# Patient Record
Sex: Female | Born: 1965 | Race: White | Hispanic: No | Marital: Married | Smoking: Former smoker
Health system: Southern US, Community
[De-identification: ages and names within clinical notes are randomized; demographics above are authoritative.]

## PROBLEM LIST (undated history)

## (undated) ENCOUNTER — Ambulatory Visit: Admission: EM

## (undated) DIAGNOSIS — T7840XA Allergy, unspecified, initial encounter: Secondary | ICD-10-CM

## (undated) DIAGNOSIS — I509 Heart failure, unspecified: Secondary | ICD-10-CM

## (undated) DIAGNOSIS — K759 Inflammatory liver disease, unspecified: Secondary | ICD-10-CM

## (undated) DIAGNOSIS — Z8489 Family history of other specified conditions: Secondary | ICD-10-CM

## (undated) DIAGNOSIS — D649 Anemia, unspecified: Secondary | ICD-10-CM

## (undated) DIAGNOSIS — G709 Myoneural disorder, unspecified: Secondary | ICD-10-CM

## (undated) DIAGNOSIS — F419 Anxiety disorder, unspecified: Secondary | ICD-10-CM

## (undated) DIAGNOSIS — H269 Unspecified cataract: Secondary | ICD-10-CM

## (undated) DIAGNOSIS — F32A Depression, unspecified: Secondary | ICD-10-CM

## (undated) DIAGNOSIS — F329 Major depressive disorder, single episode, unspecified: Secondary | ICD-10-CM

## (undated) DIAGNOSIS — K219 Gastro-esophageal reflux disease without esophagitis: Secondary | ICD-10-CM

## (undated) DIAGNOSIS — L732 Hidradenitis suppurativa: Secondary | ICD-10-CM

## (undated) HISTORY — DX: Allergy, unspecified, initial encounter: T78.40XA

## (undated) HISTORY — DX: Heart failure, unspecified: I50.9

## (undated) HISTORY — DX: Anxiety disorder, unspecified: F41.9

## (undated) HISTORY — DX: Anemia, unspecified: D64.9

## (undated) HISTORY — DX: Depression, unspecified: F32.A

## (undated) HISTORY — DX: Gastro-esophageal reflux disease without esophagitis: K21.9

## (undated) HISTORY — DX: Unspecified cataract: H26.9

## (undated) HISTORY — DX: Myoneural disorder, unspecified: G70.9

## (undated) HISTORY — DX: Major depressive disorder, single episode, unspecified: F32.9

## (undated) HISTORY — DX: Inflammatory liver disease, unspecified: K75.9

## (undated) HISTORY — DX: Hidradenitis suppurativa: L73.2

---

## 1999-03-20 ENCOUNTER — Other Ambulatory Visit: Admission: RE | Admit: 1999-03-20 | Discharge: 1999-03-20 | Payer: Self-pay | Admitting: Family Medicine

## 2000-05-15 ENCOUNTER — Encounter: Payer: Self-pay | Admitting: Family Medicine

## 2000-05-15 ENCOUNTER — Encounter: Admission: RE | Admit: 2000-05-15 | Discharge: 2000-05-15 | Payer: Self-pay | Admitting: Family Medicine

## 2000-05-18 ENCOUNTER — Encounter: Admission: RE | Admit: 2000-05-18 | Discharge: 2000-05-18 | Payer: Self-pay | Admitting: Family Medicine

## 2000-05-18 ENCOUNTER — Encounter: Payer: Self-pay | Admitting: Family Medicine

## 2001-08-24 ENCOUNTER — Other Ambulatory Visit: Admission: RE | Admit: 2001-08-24 | Discharge: 2001-08-24 | Payer: Self-pay | Admitting: Gynecology

## 2003-02-20 ENCOUNTER — Other Ambulatory Visit: Admission: RE | Admit: 2003-02-20 | Discharge: 2003-02-20 | Payer: Self-pay | Admitting: Gynecology

## 2003-11-09 ENCOUNTER — Other Ambulatory Visit: Admission: RE | Admit: 2003-11-09 | Discharge: 2003-11-09 | Payer: Self-pay | Admitting: Gynecology

## 2003-12-23 DIAGNOSIS — I509 Heart failure, unspecified: Secondary | ICD-10-CM

## 2003-12-23 HISTORY — DX: Heart failure, unspecified: I50.9

## 2004-01-23 ENCOUNTER — Encounter: Admission: RE | Admit: 2004-01-23 | Discharge: 2004-04-22 | Payer: Self-pay | Admitting: Gynecology

## 2004-05-01 ENCOUNTER — Inpatient Hospital Stay (HOSPITAL_COMMUNITY): Admission: RE | Admit: 2004-05-01 | Discharge: 2004-05-01 | Payer: Self-pay | Admitting: Gynecology

## 2004-05-02 ENCOUNTER — Encounter (INDEPENDENT_AMBULATORY_CARE_PROVIDER_SITE_OTHER): Payer: Self-pay | Admitting: Specialist

## 2004-05-02 ENCOUNTER — Inpatient Hospital Stay (HOSPITAL_COMMUNITY): Admission: RE | Admit: 2004-05-02 | Discharge: 2004-05-04 | Payer: Self-pay | Admitting: Gynecology

## 2004-06-13 ENCOUNTER — Other Ambulatory Visit: Admission: RE | Admit: 2004-06-13 | Discharge: 2004-06-13 | Payer: Self-pay | Admitting: Gynecology

## 2004-06-15 ENCOUNTER — Ambulatory Visit (HOSPITAL_COMMUNITY): Admission: RE | Admit: 2004-06-15 | Discharge: 2004-06-15 | Payer: Self-pay

## 2004-06-15 ENCOUNTER — Inpatient Hospital Stay (HOSPITAL_COMMUNITY): Admission: EM | Admit: 2004-06-15 | Discharge: 2004-06-18 | Payer: Self-pay | Admitting: Emergency Medicine

## 2004-06-17 ENCOUNTER — Encounter: Payer: Self-pay | Admitting: Cardiology

## 2004-07-23 ENCOUNTER — Encounter: Admission: RE | Admit: 2004-07-23 | Discharge: 2004-07-23 | Payer: Self-pay | Admitting: Internal Medicine

## 2004-12-22 DIAGNOSIS — K759 Inflammatory liver disease, unspecified: Secondary | ICD-10-CM

## 2004-12-22 HISTORY — PX: CHOLECYSTECTOMY: SHX55

## 2004-12-22 HISTORY — DX: Inflammatory liver disease, unspecified: K75.9

## 2004-12-24 ENCOUNTER — Ambulatory Visit: Payer: Self-pay

## 2004-12-26 ENCOUNTER — Ambulatory Visit: Payer: Self-pay | Admitting: Cardiology

## 2005-01-07 ENCOUNTER — Ambulatory Visit: Payer: Self-pay | Admitting: Internal Medicine

## 2005-02-14 ENCOUNTER — Ambulatory Visit: Payer: Self-pay | Admitting: Cardiology

## 2005-04-07 ENCOUNTER — Ambulatory Visit: Payer: Self-pay | Admitting: Internal Medicine

## 2005-04-08 ENCOUNTER — Ambulatory Visit: Payer: Self-pay | Admitting: Cardiology

## 2005-04-09 ENCOUNTER — Ambulatory Visit: Payer: Self-pay | Admitting: Internal Medicine

## 2005-04-18 ENCOUNTER — Ambulatory Visit: Payer: Self-pay

## 2005-04-18 ENCOUNTER — Ambulatory Visit: Payer: Self-pay | Admitting: Cardiology

## 2005-05-23 ENCOUNTER — Ambulatory Visit: Payer: Self-pay | Admitting: Cardiology

## 2005-05-27 ENCOUNTER — Ambulatory Visit: Payer: Self-pay | Admitting: Gastroenterology

## 2005-05-27 ENCOUNTER — Encounter: Payer: Self-pay | Admitting: Cardiology

## 2005-05-27 ENCOUNTER — Inpatient Hospital Stay (HOSPITAL_COMMUNITY): Admission: EM | Admit: 2005-05-27 | Discharge: 2005-05-28 | Payer: Self-pay | Admitting: Surgery

## 2005-05-30 ENCOUNTER — Ambulatory Visit: Payer: Self-pay | Admitting: Internal Medicine

## 2005-06-02 ENCOUNTER — Ambulatory Visit: Payer: Self-pay | Admitting: Gastroenterology

## 2005-06-03 ENCOUNTER — Ambulatory Visit: Payer: Self-pay | Admitting: Internal Medicine

## 2005-06-20 ENCOUNTER — Ambulatory Visit: Payer: Self-pay | Admitting: Internal Medicine

## 2005-07-08 ENCOUNTER — Ambulatory Visit (HOSPITAL_COMMUNITY): Admission: RE | Admit: 2005-07-08 | Discharge: 2005-07-09 | Payer: Self-pay | Admitting: General Surgery

## 2005-07-08 ENCOUNTER — Encounter (INDEPENDENT_AMBULATORY_CARE_PROVIDER_SITE_OTHER): Payer: Self-pay | Admitting: Specialist

## 2005-08-12 ENCOUNTER — Other Ambulatory Visit: Admission: RE | Admit: 2005-08-12 | Discharge: 2005-08-12 | Payer: Self-pay | Admitting: Gynecology

## 2005-08-22 ENCOUNTER — Ambulatory Visit: Payer: Self-pay | Admitting: Internal Medicine

## 2005-09-16 ENCOUNTER — Ambulatory Visit: Payer: Self-pay | Admitting: *Deleted

## 2005-11-25 ENCOUNTER — Ambulatory Visit: Payer: Self-pay | Admitting: Internal Medicine

## 2006-02-18 ENCOUNTER — Ambulatory Visit: Payer: Self-pay | Admitting: Internal Medicine

## 2006-02-24 ENCOUNTER — Ambulatory Visit: Payer: Self-pay | Admitting: Internal Medicine

## 2006-04-08 ENCOUNTER — Ambulatory Visit: Payer: Self-pay | Admitting: Cardiology

## 2006-04-29 ENCOUNTER — Encounter: Payer: Self-pay | Admitting: Internal Medicine

## 2006-04-29 ENCOUNTER — Ambulatory Visit: Payer: Self-pay

## 2006-05-12 IMAGING — CT CT ANGIO CHEST
1 of 6 series · 11 of 30 positions shown · IV contrast (omnipaque)
Comparison: none

CLINICAL DATA: 37-year-old female ? shortness of breath.  Six weeks postpartum and tachycardia.  
CT ANGIOGRAM OF THE CHEST WITH MULTIPLANAR RECONSTRUCTIONS 06/15/04
Multidetector CT imaging of the chest was performed according to the protocol for detection of pulmonary embolism during IV bolus injection of 120 cc Omnipaque 300.  Coronal and sagittal plane reformatted images were also generated.

[Series 4: pe w/ lower ext · axial · 0.75mm/px · z∈[-343,-84]mm · 11 of 255 slices shown]
[im 24/255  lung]
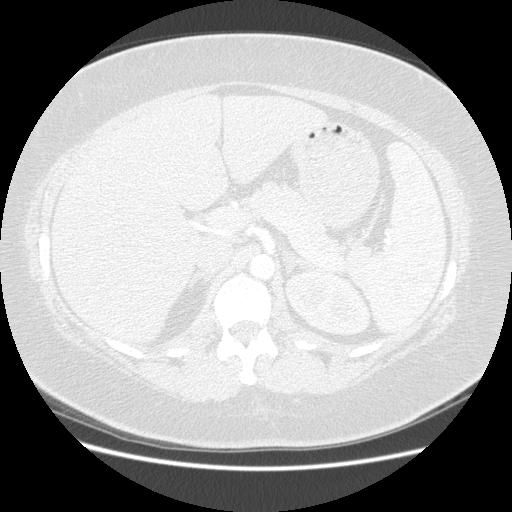
[im 47/255  mediastinal]
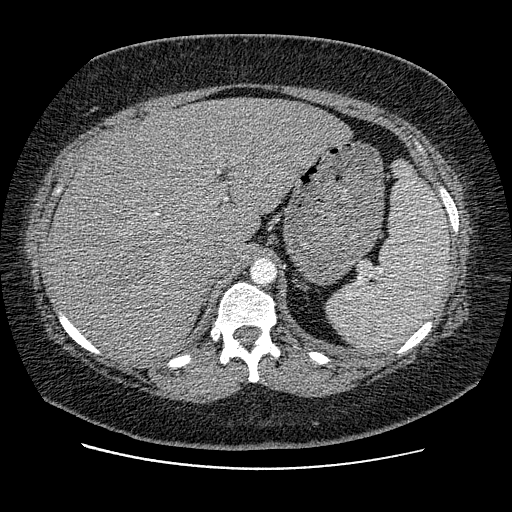
[im 70/255  lung]
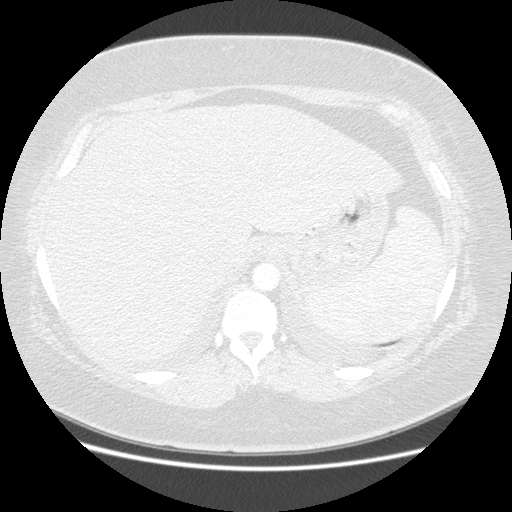
[im 93/255  mediastinal]
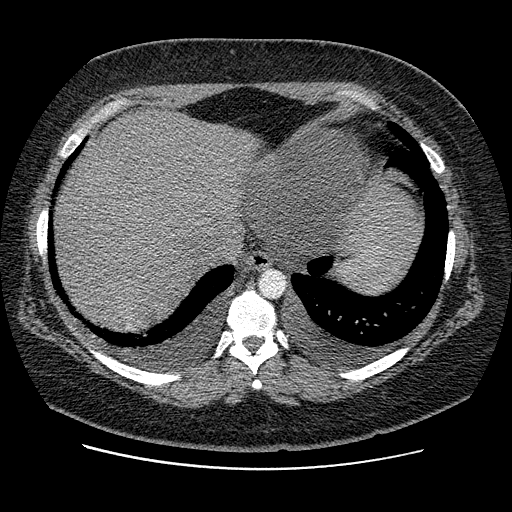
[im 116/255  lung]
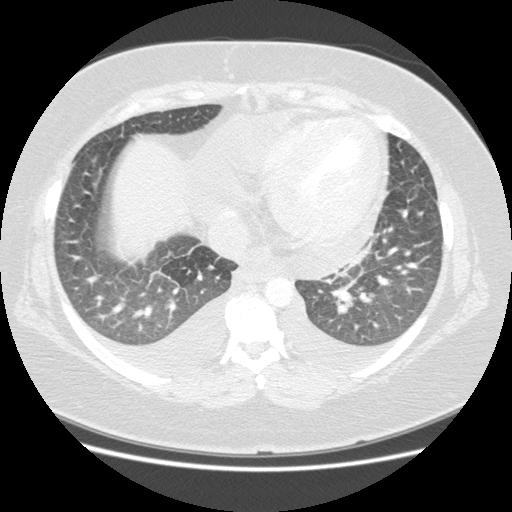
[im 120/255  mediastinal]
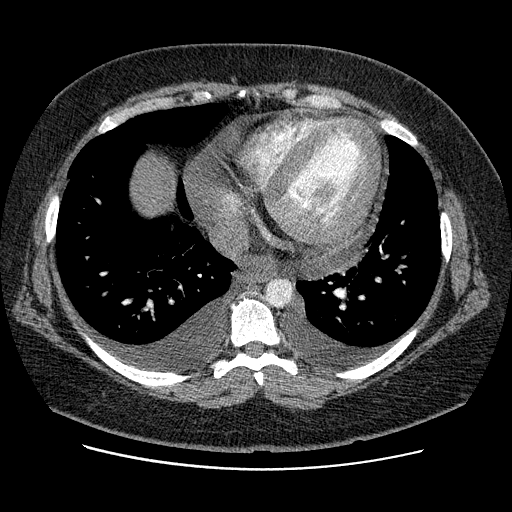
[im 139/255  lung]
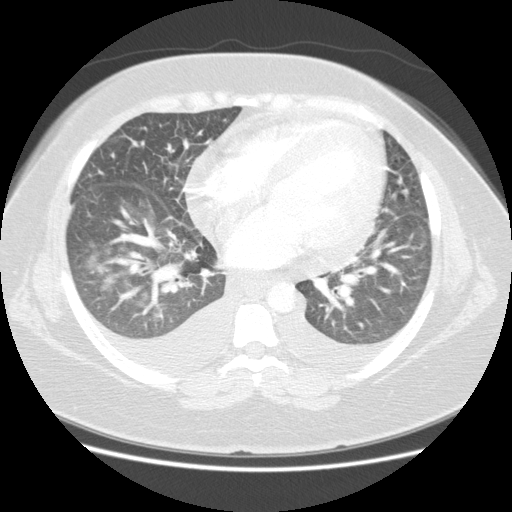
[im 162/255  mediastinal]
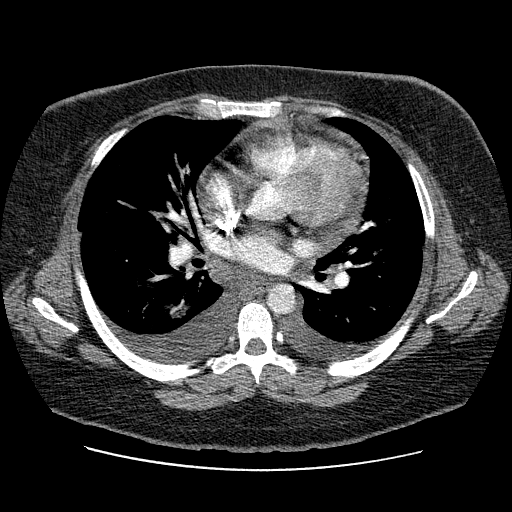
[im 185/255  lung]
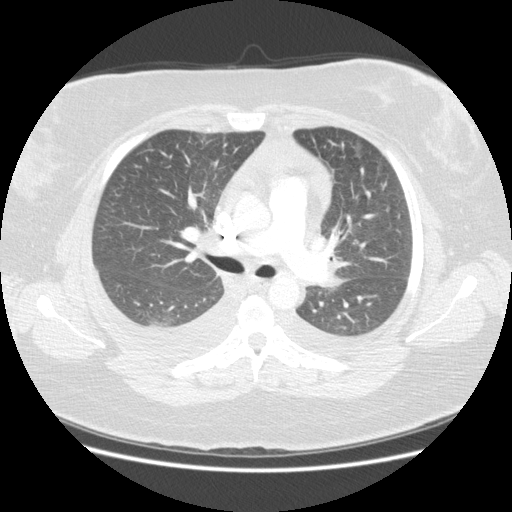
[im 208/255  mediastinal]
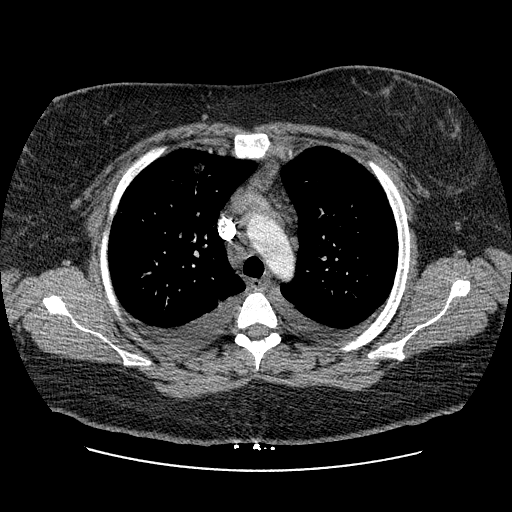
[im 231/255  lung]
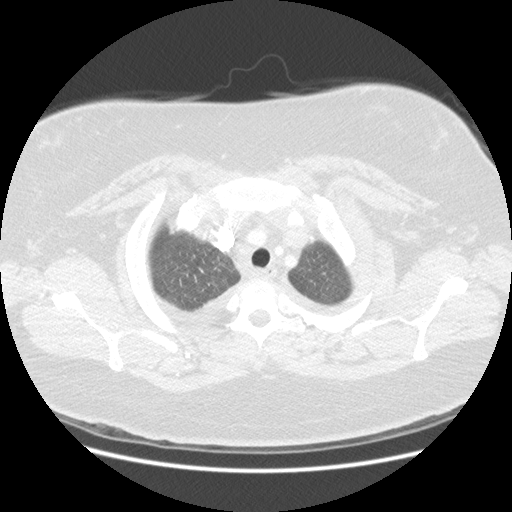

[11 of 30 positions shown; findings below may reference images not displayed]

FINDINGS: The patient has a moderate pericardial effusion predominantly along the under surface of the heart.  This accounts for the cardiac silhouette enlargement by chest radiograph.  Symmetric small to moderate pleural effusions are also noted.  The enhanced pulmonary arterial vascularity demonstrates no definite filling defect to suggest thromboembolic disease to the chest.  Exam is limited in visualization of the distal segmental and subsegmental branches.  Residual thymic tissue is evident in the anterior mediastinum.  Scattered small pre-vascular lymph nodes and right paratracheal lymph nodes are seen.  Subcarinal lymphadenopathy is suspected.  Lung windows demonstrate scattered patchy interstitial opacities in the upper lobes.  Interstitial opacities are also evident in the right middle and lower lobes.  Interlobular septal thickening is evident in the lower lobes diffusely.  These changes probably present early mild interstitial edema symmetrically throughout the lungs.  The patchy areas of airspace disease could represent early alveolar edema versus developing pneumonia especially in the right lung diffusely.  Small tiny punctate subpleural nodule is evident in the right upper lobe, image 24 of series 6.  
In the lateral segment left hepatic lobe there is a 10 mm nonspecific low density lesion probably representing a small hepatic cyst. 
IMPRESSION
1.  Moderate sized pericardial effusion predominantly along the under surface of the heart. 
2.  Symmetric small to moderate bilateral pleural effusions.  
3.  Diffuse interstitial edema throughout the lower lobes consistent with mild CHF. 
4.  Predominantly right lung patchy alveolar airspace disease concerning for associated alveolar edema and/or developing pneumonia.  
5.  No CT evidence of pulmonary embolus.  
Findings were called to Dr. Alexssandro following the exam.

## 2006-05-14 IMAGING — CR DG CHEST 2V
2 series · 2 of 2 positions shown · non-contrast
Comparison: none

CLINICAL DATA: CHF, precardiac cath respiratory evaluation.  History of smoking and hypertension. 
 CHEST TWO VIEWS, 06/17/04
 Comparison to CT 06/15/04.
 There is cardiomegaly.  Bilateral airspace opacities noted compatible with mild CHF.  Small bilateral effusions present.  
 IMPRESSION
 Mild CHF.

[view not recorded (1 of 2)]
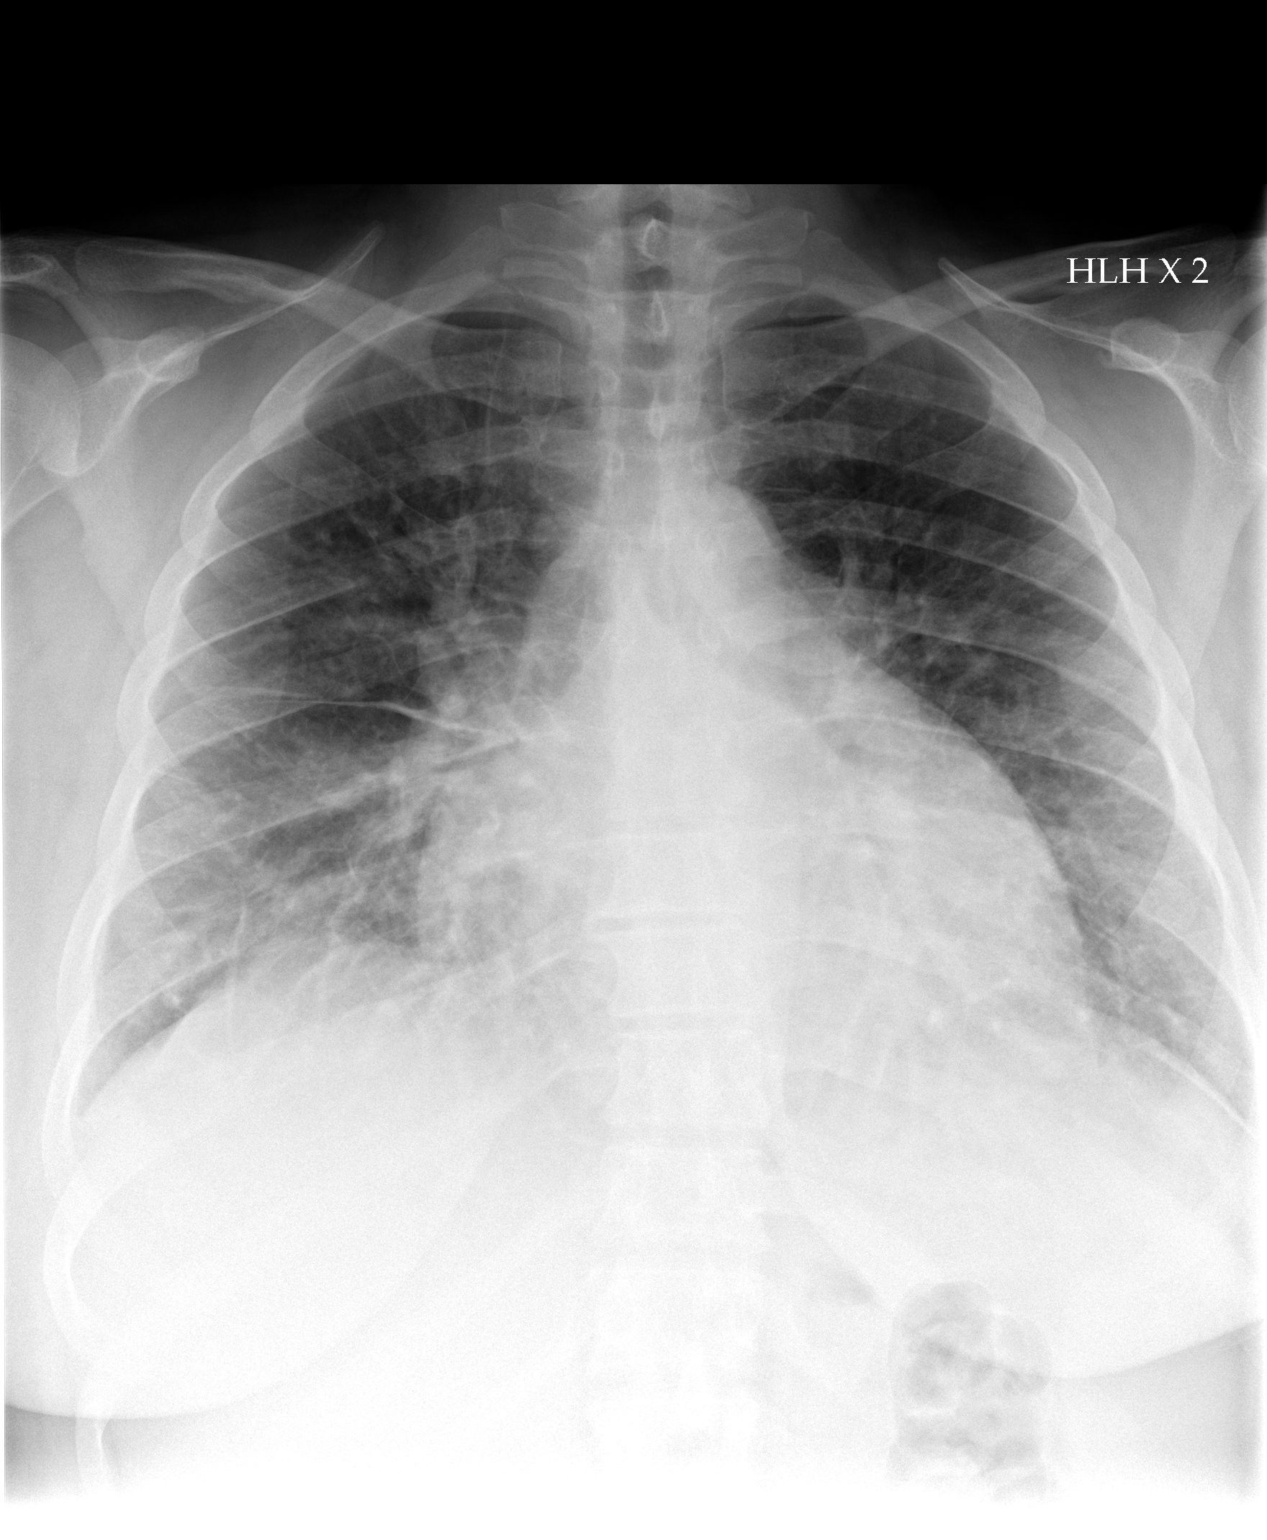

[view not recorded (2 of 2)]
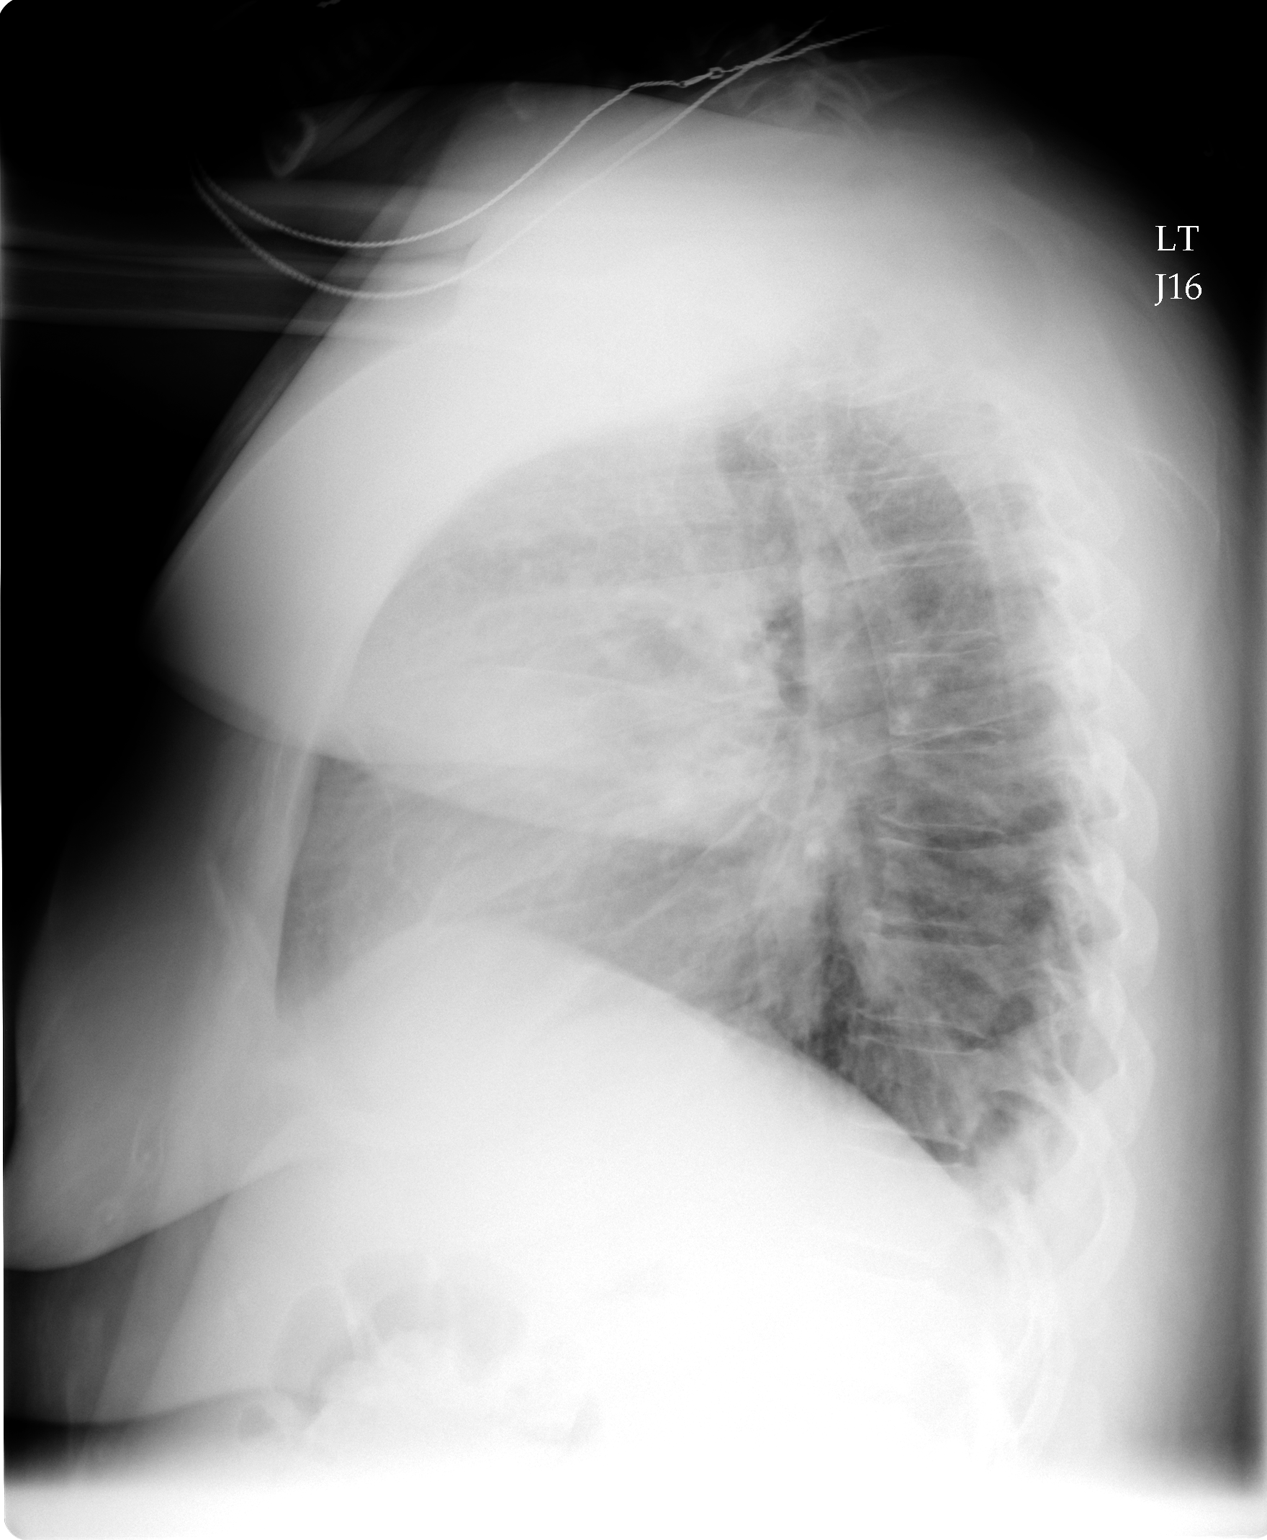

[2 of 2 positions shown; findings below may reference images not displayed]

## 2006-06-09 ENCOUNTER — Ambulatory Visit: Payer: Self-pay | Admitting: Internal Medicine

## 2006-06-17 ENCOUNTER — Ambulatory Visit (HOSPITAL_COMMUNITY): Admission: RE | Admit: 2006-06-17 | Discharge: 2006-06-17 | Payer: Self-pay | Admitting: Gynecology

## 2006-08-25 ENCOUNTER — Other Ambulatory Visit: Admission: RE | Admit: 2006-08-25 | Discharge: 2006-08-25 | Payer: Self-pay | Admitting: Gynecology

## 2006-10-14 ENCOUNTER — Ambulatory Visit: Payer: Self-pay | Admitting: Internal Medicine

## 2006-12-08 ENCOUNTER — Ambulatory Visit: Payer: Self-pay | Admitting: Internal Medicine

## 2006-12-08 LAB — CONVERTED CEMR LAB
ALT: 60 units/L — ABNORMAL HIGH (ref 0–40)
AST: 66 units/L — ABNORMAL HIGH (ref 0–37)
Alkaline Phosphatase: 77 units/L (ref 39–117)
BUN: 5 mg/dL — ABNORMAL LOW (ref 6–23)
CO2: 23 meq/L (ref 19–32)
Calcium: 9.2 mg/dL (ref 8.4–10.5)
Glomerular Filtration Rate, Af Am: 145 mL/min/{1.73_m2}
Potassium: 4.5 meq/L (ref 3.5–5.1)
Sodium: 135 meq/L (ref 135–145)

## 2006-12-23 ENCOUNTER — Ambulatory Visit: Payer: Self-pay | Admitting: Internal Medicine

## 2007-04-12 ENCOUNTER — Ambulatory Visit: Payer: Self-pay | Admitting: Internal Medicine

## 2007-04-12 LAB — CONVERTED CEMR LAB
ALT: 46 units/L — ABNORMAL HIGH (ref 0–40)
AST: 36 units/L (ref 0–37)
Albumin: 3.6 g/dL (ref 3.5–5.2)
BUN: 6 mg/dL (ref 6–23)
Bilirubin, Direct: 0.1 mg/dL (ref 0.0–0.3)
CO2: 27 meq/L (ref 19–32)
GFR calc Af Amer: 178 mL/min
Glucose, Bld: 191 mg/dL — ABNORMAL HIGH (ref 70–99)
HDL: 32.8 mg/dL — ABNORMAL LOW (ref >39.0)
Hgb A1c MFr Bld: 5.2 % (ref 4.6–6.0)
Sodium: 138 meq/L (ref 135–145)
Triglycerides: 375 mg/dL (ref 0–149)
VLDL: 75 mg/dL — ABNORMAL HIGH (ref 0–40)

## 2007-04-13 ENCOUNTER — Ambulatory Visit: Payer: Self-pay | Admitting: Internal Medicine

## 2007-04-28 ENCOUNTER — Ambulatory Visit: Payer: Self-pay | Admitting: Cardiology

## 2007-05-04 ENCOUNTER — Ambulatory Visit: Payer: Self-pay | Admitting: Endocrinology

## 2007-05-19 ENCOUNTER — Ambulatory Visit: Payer: Self-pay

## 2007-05-19 ENCOUNTER — Encounter: Payer: Self-pay | Admitting: Cardiology

## 2007-09-14 ENCOUNTER — Ambulatory Visit: Payer: Self-pay | Admitting: Cardiology

## 2007-09-15 ENCOUNTER — Ambulatory Visit (HOSPITAL_COMMUNITY): Admission: RE | Admit: 2007-09-15 | Discharge: 2007-09-15 | Payer: Self-pay | Admitting: Obstetrics and Gynecology

## 2007-09-24 ENCOUNTER — Encounter: Payer: Self-pay | Admitting: *Deleted

## 2007-09-24 DIAGNOSIS — I509 Heart failure, unspecified: Secondary | ICD-10-CM | POA: Insufficient documentation

## 2007-09-24 DIAGNOSIS — E282 Polycystic ovarian syndrome: Secondary | ICD-10-CM | POA: Insufficient documentation

## 2007-09-24 DIAGNOSIS — I5023 Acute on chronic systolic (congestive) heart failure: Secondary | ICD-10-CM | POA: Insufficient documentation

## 2007-09-24 DIAGNOSIS — R945 Abnormal results of liver function studies: Secondary | ICD-10-CM

## 2007-10-05 ENCOUNTER — Ambulatory Visit: Payer: Self-pay | Admitting: Pediatrics

## 2007-10-13 ENCOUNTER — Ambulatory Visit (HOSPITAL_COMMUNITY): Admission: RE | Admit: 2007-10-13 | Discharge: 2007-10-13 | Payer: Self-pay | Admitting: Obstetrics and Gynecology

## 2007-10-26 ENCOUNTER — Encounter: Payer: Self-pay | Admitting: Cardiology

## 2007-10-26 ENCOUNTER — Ambulatory Visit: Payer: Self-pay

## 2007-11-12 ENCOUNTER — Encounter (INDEPENDENT_AMBULATORY_CARE_PROVIDER_SITE_OTHER): Payer: Self-pay | Admitting: Obstetrics and Gynecology

## 2007-11-12 ENCOUNTER — Inpatient Hospital Stay (HOSPITAL_COMMUNITY): Admission: RE | Admit: 2007-11-12 | Discharge: 2007-11-15 | Payer: Self-pay | Admitting: Obstetrics and Gynecology

## 2007-12-09 ENCOUNTER — Ambulatory Visit: Payer: Self-pay | Admitting: Cardiology

## 2007-12-21 ENCOUNTER — Ambulatory Visit: Payer: Self-pay

## 2007-12-21 ENCOUNTER — Encounter: Payer: Self-pay | Admitting: Cardiology

## 2008-01-11 ENCOUNTER — Ambulatory Visit: Payer: Self-pay | Admitting: Internal Medicine

## 2008-01-11 DIAGNOSIS — F411 Generalized anxiety disorder: Secondary | ICD-10-CM | POA: Insufficient documentation

## 2008-01-11 DIAGNOSIS — E669 Obesity, unspecified: Secondary | ICD-10-CM | POA: Insufficient documentation

## 2008-01-11 DIAGNOSIS — E119 Type 2 diabetes mellitus without complications: Secondary | ICD-10-CM

## 2008-01-11 DIAGNOSIS — E1165 Type 2 diabetes mellitus with hyperglycemia: Secondary | ICD-10-CM | POA: Insufficient documentation

## 2008-01-11 DIAGNOSIS — F419 Anxiety disorder, unspecified: Secondary | ICD-10-CM | POA: Insufficient documentation

## 2008-01-11 DIAGNOSIS — F4323 Adjustment disorder with mixed anxiety and depressed mood: Secondary | ICD-10-CM

## 2008-01-11 DIAGNOSIS — E1169 Type 2 diabetes mellitus with other specified complication: Secondary | ICD-10-CM | POA: Insufficient documentation

## 2008-01-24 ENCOUNTER — Encounter: Payer: Self-pay | Admitting: Internal Medicine

## 2008-01-25 ENCOUNTER — Telehealth: Payer: Self-pay | Admitting: Internal Medicine

## 2008-02-18 ENCOUNTER — Ambulatory Visit: Payer: Self-pay | Admitting: Internal Medicine

## 2008-04-10 ENCOUNTER — Ambulatory Visit: Payer: Self-pay | Admitting: Internal Medicine

## 2008-04-11 LAB — CONVERTED CEMR LAB
ALT: 25 units/L (ref 0–35)
AST: 28 units/L (ref 0–37)
Bilirubin, Direct: 0.1 mg/dL (ref 0.0–0.3)
CO2: 27 meq/L (ref 19–32)
Eosinophils Absolute: 0.5 10*3/uL (ref 0.0–0.7)
GFR calc non Af Amer: 146 mL/min
Glucose, Bld: 154 mg/dL — ABNORMAL HIGH (ref 70–99)
HCT: 35.5 % — ABNORMAL LOW (ref 36.0–46.0)
Hemoglobin: 12.3 g/dL (ref 12.0–15.0)
Hgb A1c MFr Bld: 5.3 % (ref 4.6–6.0)
Lymphocytes Relative: 23.2 % (ref 12.0–46.0)
MCHC: 34.6 g/dL (ref 30.0–36.0)
Monocytes Absolute: 0.6 10*3/uL (ref 0.1–1.0)
Platelets: 261 10*3/uL (ref 150–400)
RBC: 4.12 M/uL (ref 3.87–5.11)
RDW: 13.6 % (ref 11.5–14.6)
Sodium: 139 meq/L (ref 135–145)
Total Bilirubin: 0.6 mg/dL (ref 0.3–1.2)
Total Protein: 6.8 g/dL (ref 6.0–8.3)
WBC: 14.7 10*3/uL — ABNORMAL HIGH (ref 4.5–10.5)

## 2008-04-13 ENCOUNTER — Ambulatory Visit: Payer: Self-pay | Admitting: Internal Medicine

## 2008-04-13 DIAGNOSIS — F17200 Nicotine dependence, unspecified, uncomplicated: Secondary | ICD-10-CM

## 2008-07-02 ENCOUNTER — Encounter (INDEPENDENT_AMBULATORY_CARE_PROVIDER_SITE_OTHER): Payer: Self-pay | Admitting: Obstetrics and Gynecology

## 2008-07-02 ENCOUNTER — Ambulatory Visit (HOSPITAL_COMMUNITY): Admission: AD | Admit: 2008-07-02 | Discharge: 2008-07-02 | Payer: Self-pay | Admitting: Obstetrics and Gynecology

## 2008-12-18 ENCOUNTER — Telehealth (INDEPENDENT_AMBULATORY_CARE_PROVIDER_SITE_OTHER): Payer: Self-pay | Admitting: *Deleted

## 2009-01-10 ENCOUNTER — Ambulatory Visit: Payer: Self-pay | Admitting: Internal Medicine

## 2009-01-10 LAB — CONVERTED CEMR LAB: TSH: 2.72 microintl units/mL (ref 0.35–5.50)

## 2009-01-12 ENCOUNTER — Ambulatory Visit: Payer: Self-pay | Admitting: Internal Medicine

## 2009-01-12 DIAGNOSIS — R0602 Shortness of breath: Secondary | ICD-10-CM | POA: Insufficient documentation

## 2009-01-12 DIAGNOSIS — R0609 Other forms of dyspnea: Secondary | ICD-10-CM | POA: Insufficient documentation

## 2009-01-19 ENCOUNTER — Ambulatory Visit: Payer: Self-pay | Admitting: Cardiology

## 2009-07-09 ENCOUNTER — Ambulatory Visit: Payer: Self-pay | Admitting: Internal Medicine

## 2009-07-09 LAB — CONVERTED CEMR LAB
AST: 32 units/L (ref 0–37)
BUN: 8 mg/dL (ref 6–23)
CO2: 28 meq/L (ref 19–32)
Calcium: 9.1 mg/dL (ref 8.4–10.5)
Creatinine, Ser: 0.6 mg/dL (ref 0.4–1.2)
Eosinophils Relative: 3.2 % (ref 0.0–5.0)
GFR calc non Af Amer: 117.36 mL/min (ref >60)
Glucose, Bld: 130 mg/dL — ABNORMAL HIGH (ref 70–99)
HDL: 31.1 mg/dL — ABNORMAL LOW (ref >39.00)
Hemoglobin: 13.1 g/dL (ref 12.0–15.0)
Hgb A1c MFr Bld: 5.7 % (ref 4.6–6.5)
MCHC: 35.3 g/dL (ref 30.0–36.0)
MCV: 87.5 fL (ref 78.0–100.0)
Monocytes Absolute: 0.4 10*3/uL (ref 0.1–1.0)
Monocytes Relative: 4 % (ref 3.0–12.0)
Neutro Abs: 6.4 10*3/uL (ref 1.4–7.7)
Neutrophils Relative %: 64.7 % (ref 43.0–77.0)
Platelets: 181 10*3/uL (ref 150.0–400.0)
Potassium: 3.9 meq/L (ref 3.5–5.1)
RDW: 13 % (ref 11.5–14.6)
Sodium: 140 meq/L (ref 135–145)
Specific Gravity, Urine: 1.01 (ref 1.000–1.030)
TSH: 1.99 microintl units/mL (ref 0.35–5.50)
Total CHOL/HDL Ratio: 7
Triglycerides: 546 mg/dL — ABNORMAL HIGH (ref 0.0–149.0)
Urobilinogen, UA: 0.2 (ref 0.0–1.0)
VLDL: 109.2 mg/dL — ABNORMAL HIGH (ref 0.0–40.0)
pH: 5.5 (ref 5.0–8.0)

## 2009-07-11 ENCOUNTER — Ambulatory Visit: Payer: Self-pay | Admitting: Internal Medicine

## 2009-07-11 DIAGNOSIS — L0293 Carbuncle, unspecified: Secondary | ICD-10-CM

## 2009-07-11 DIAGNOSIS — L0292 Furuncle, unspecified: Secondary | ICD-10-CM | POA: Insufficient documentation

## 2009-12-24 ENCOUNTER — Ambulatory Visit: Payer: Self-pay | Admitting: Internal Medicine

## 2009-12-25 LAB — CONVERTED CEMR LAB
BUN: 3 mg/dL — ABNORMAL LOW (ref 6–23)
Basophils Absolute: 0.1 10*3/uL (ref 0.0–0.1)
Basophils Relative: 1.2 % (ref 0.0–3.0)
GFR calc non Af Amer: 144.5 mL/min (ref >60)
HCT: 38.3 % (ref 36.0–46.0)
Hgb A1c MFr Bld: 5.7 % (ref 4.6–6.5)
Lymphocytes Relative: 25.9 % (ref 12.0–46.0)
Lymphs Abs: 2.6 10*3/uL (ref 0.7–4.0)
Monocytes Absolute: 0.3 10*3/uL (ref 0.1–1.0)
Monocytes Relative: 3.4 % (ref 3.0–12.0)
Neutrophils Relative %: 66.9 % (ref 43.0–77.0)
Platelets: 214 10*3/uL (ref 150.0–400.0)
Potassium: 4.2 meq/L (ref 3.5–5.1)
RDW: 13.1 % (ref 11.5–14.6)

## 2010-08-19 ENCOUNTER — Ambulatory Visit: Payer: Self-pay | Admitting: Internal Medicine

## 2010-08-23 ENCOUNTER — Telehealth: Payer: Self-pay | Admitting: Internal Medicine

## 2010-10-23 ENCOUNTER — Ambulatory Visit: Payer: Self-pay | Admitting: Internal Medicine

## 2010-11-22 ENCOUNTER — Telehealth: Payer: Self-pay | Admitting: Internal Medicine

## 2011-01-12 ENCOUNTER — Encounter: Payer: Self-pay | Admitting: Internal Medicine

## 2011-01-12 ENCOUNTER — Encounter: Payer: Self-pay | Admitting: Obstetrics and Gynecology

## 2011-01-21 ENCOUNTER — Encounter: Payer: Self-pay | Admitting: Internal Medicine

## 2011-01-21 ENCOUNTER — Ambulatory Visit
Admission: RE | Admit: 2011-01-21 | Discharge: 2011-01-21 | Payer: Self-pay | Source: Home / Self Care | Attending: Internal Medicine | Admitting: Internal Medicine

## 2011-01-21 DIAGNOSIS — R000 Tachycardia, unspecified: Secondary | ICD-10-CM | POA: Insufficient documentation

## 2011-01-21 NOTE — Progress Notes (Signed)
Summary: RESULTS  Phone Note Call from Patient   Summary of Call: Patient is requesting results of last "tests" Initial call taken by: Lamar Sprinkles, CMA,  August 23, 2010 3:01 PM  Follow-up for Phone Call        Wound cx with nonespecific Strep (no MRSA). Cont antibiotic as dirrected Follow-up by: Tresa Garter MD,  August 23, 2010 5:47 PM  Additional Follow-up for Phone Call Additional follow up Details #1::        left mess to call office back.............Marland KitchenLamar Sprinkles, CMA  August 23, 2010 5:50 PM   Pt informed Additional Follow-up by: Margaret Pyle, CMA,  August 27, 2010 10:55 AM

## 2011-01-21 NOTE — Progress Notes (Signed)
Summary: Rf Alprazolam  Phone Note Refill Request Message from:  Fax from Pharmacy  Refills Requested: Medication #1:  XANAX 0.5 MG TABS 1 by mouth two times a day as needed anxiety   Dosage confirmed as above?Dosage Confirmed   Supply Requested: 60   Last Refilled: 06/04/2010  Method Requested: Telephone to Pharmacy Next Appointment Scheduled: none Initial call taken by: Lanier Prude, [facility]),  November 22, 2010 11:18 AM  Follow-up for Phone Call        ok 1 ref needs ov Follow-up by: Tresa Garter MD,  November 22, 2010 1:14 PM    Prescriptions: Prudy Feeler 0.5 MG TABS (ALPRAZOLAM) 1 by mouth two times a day as needed anxiety  #60 x 0   Entered by:   Lamar Sprinkles, CMA   Authorized by:   Tresa Garter MD   Signed by:   Lamar Sprinkles, CMA on 11/23/2010   Method used:   Telephoned to ...       [facility]  [address] [id number] ([facility]ail[facility]       [address]       [place], [place]  [address]       Ph: [phone] or [phone]       Fax: [phone]   RxID:   [id number]

## 2011-01-21 NOTE — Assessment & Plan Note (Signed)
Summary: ear infection/rx refills/lb   Vital Signs:  Patient profile:   45 year old female Height:      67 inches O2 Sat:      97 % on Room air Temp:     97.5 degrees F 97oral Pulse rate:   124 / minute Pulse rhythm:   regular BP sitting:   128 / 80  (left arm) Cuff size:   large  Vitals Entered By: Rock Nephew CMA (August 19, 2010 4:36 PM)  O2 Flow:  Room air CBG Result 307   History of Present Illness: C/o R earache C/o boils in R axilla x 1 year - pain and swelling with d/c The patient presents for a follow up of hypertension, diabetes - out of meds x 3 mo  Current Medications (verified): 1)  Procardia Xl 60 Mg  Tb24 (Nifedipine) .... Take 1 Tablet By Mouth Once A Day 2)  Metformin Hcl 1000 Mg Tabs (Metformin Hcl) .... Take 1 Tablet By Mouth Two Times A Day 3)  Xanax 0.5 Mg Tabs (Alprazolam) .[name].. 1 By Mouth Two Times A Day As Needed Anxiety 4)  Flexeril 5 Mg  Tabs (Cyclobenzaprine Hcl) .... Take 1 Tab By Mouth At Bedtime 5)  Vitamin D3 1000 Unit  Tabs (Cholecalciferol) .[name].. 1 Qd 6)  Ranitidine Hcl 150 Mg Caps (Ranitidine Hcl) .[name].. 1 Po Bid 7)  Advair Diskus 100-50 Mcg/dose Misc (Fluticasone-Salmeterol) .[name].. 1 Puff 2 Times Daily 8)  Wellbutrin Sr 150 Mg Xr12h-Tab (Bupropion Hcl) .[name].. 1 By Mouth Bid 9)  Celexa 10 Mg Tabs (Citalopram Hydrobromide) .[name].. 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     Physical Exam  General:  overweight-appearing.   Head:  Normocephalic and atraumatic without obvious abnormalities. No apparent alopecia or balding. Eyes:  No corneal or conjunctival inflammation noted. EOMI. Perrla. Ears:  External ear exam shows no significant lesions or deformities.  Otoscopic examination reveals clear canals, tympanic membranes are intact bilaterally without bulging, retraction, inflammation or discharge. Hearing is grossly normal bilaterally. Nose:  External nasal examination shows no deformity or inflammation. Nasal mucosa are pink and moist without lesions or exudates. Mouth:  Oral mucosa and oropharynx without lesions or exudates.  Teeth in good repair. Neck:  No deformities, masses, or tenderness noted. Lungs:  Normal respiratory effort, chest expands symmetrically. Lungs are clear to auscultation, no crackles or wheezes. Heart:  Normal rate and regular rhythm. S1 and S2 normal without gallop, murmur, click, rub or other extra sounds. Abdomen:  Bowel sounds positive,abdomen soft and non-tender without masses, organomegaly or hernias noted. Msk:  No deformity or scoliosis noted of thoracic or lumbar spine.   Skin:  R axilla with 5-7 boils 5-10 mm some confluent and with communicating tracks; purulent d/c. Cx obtained Psych:  Oriented X3 and good eye contact.  not suicidal and subdued.     Impression & Recommendations:  Problem # 1:  BOILS, RECURRENT (ICD-680.9) R axilla Assessment Deteriorated Doxy See "Patient Instructions". See meds (Hibiclense, Mupirocin) Topical Lido as  needed Hypertonic saline soaks Surg cons if not better Treat #2 Orders: T-Culture & Smear Routine Fluid (Body Fluid) (87070/87205-70260)  Problem # 2:  DIABETES MELLITUS, TYPE II (ICD-250.00) Assessment: Deteriorated Risks of noncompliance with treatment discussed. Compliance encouraged.  The following medications were removed from the medication list:    Metformin Hcl 1000 Mg Tabs  (Metformin hcl) .[name]... Take 1 tablet by mouth two times a day Her updated medication list for this problem includes:    Janumet 50-500 Mg Tabs (Sitagliptin-metformin hcl) .[name]... 1 by mouth bid  Orders: Capillary Blood Glucose/CBG ([address])  Problem # 3:  DEPRESSION (ICD-311) Assessment: Unchanged  Her updated medication list for this problem includes:    Xanax 0.5 Mg Tabs (Alprazolam) .[name]... 1 by mouth two times a day as needed anxiety    Wellbutrin Sr 150 Mg Xr12h-tab (Bupropion hcl) .[name]... 1 by mouth bid    Celexa 10 Mg Tabs (Citalopram hydrobromide) .[name]... 1 by mouth qd  Problem # 4:  CONGESTIVE HEART FAILURE (ICD-428.0) Assessment: Improved  Complete Medication List: 1)  Procardia Xl 60 Mg Tb24 (Nifedipine) .... Take 1 tablet by mouth once a day 2)  Xanax 0.5 Mg Tabs (Alprazolam) .[name].. 1 by mouth two times a day as needed anxiety 3)  Flexeril 5 Mg Tabs (Cyclobenzaprine hcl) .... Take 1 tab by mouth at bedtime 4)  Vitamin D3 1000 Unit Tabs (Cholecalciferol) .[name].. 1 qd 5)  Ranitidine Hcl 150 Mg Caps (Ranitidine hcl) .[name].. 1 po bid 6)  Advair Diskus 100-50 Mcg/dose Misc (Fluticasone-salmeterol) .[name].. 1 puff 2 times daily 7)  Wellbutrin Sr 150 Mg Xr12h-tab (Bupropion hcl) .[name].. 1 by mouth bid 8)  Celexa 10 Mg Tabs (Citalopram hydrobromide) .[name].. 1 by mouth qd 9)  Doxycycline Hyclate 100 Mg Caps (Doxycycline hyclate) .[name].. 1 by mouth two times a day with a glass of water 10)  Hibiclens 4 % Liqd (Chlorhexidine gluconate) .... Use for shower gd 11)  Mupirocin 2 % Oint (Mupirocin) .... Use two times a day 12)  Janumet 50-500 Mg Tabs (Sitagliptin-metformin hcl) .[name].. 1 by mouth bid 13)  Lidocaine Hcl 2 % Gel (Lidocaine hcl) .... Use qid prn  Patient Instructions: 1)  Please schedule a follow-up appointment in 2 months well w/labs and A1c 250.02. 2)  Try to eat more raw plant food, fresh and dry fruit, raw almonds, leafy vegetables, whole foods and less red meat, less animal fat. Poultry and fish is  better for you than pork and beef. Avoid processed foods (canned soups, hot dogs, sausage, bacon , frozen dinners). Avoid corn syrup, high fructose syrup or aspartam  containing drinks. Honey, Agave and Stevia are better sweeteners. Make your own  dressing with olive oil, wine vinegar, lemon juce, garlic etc. for your salads.  Prescriptions: LIDOCAINE HCL 2 % GEL (LIDOCAINE HCL) use qid prn  #120 g x 3   Entered and Authorized by:   Tresa Garter MD   Signed by:   Tresa Garter MD on 08/19/2010   Method used:   Print then Give to Patient   RxID:   [id number] JANUMET 50-500 MG TABS (SITAGLIPTIN-METFORMIN HCL) 1 by mouth bid  #60 x 12   Entered and Authorized by:   Tresa Garter MD   Signed by:   Tresa Garter MD on 08/19/2010   Method used:   Print then Give to Patient   RxID:   [id number] MUPIROCIN 2 % OINT (MUPIROCIN) use two times a day  #30 g x  3   Entered and Authorized by:   Tresa Garter MD   Signed by:   Tresa Garter MD on 08/19/2010   Method used:   [facility]        [facility]  [address] [id number]       [address]       [place], [place]  [address]       Ph: [phone] or [phone]       Fax: [phone]   RxID:   [id number] HIBICLENS 4 % LIQD (CHLORHEXIDINE GLUCONATE) Use for shower gd  #300 ml x 2   Entered and Authorized by:   Tresa Garter MD   Signed by:   Tresa Garter MD on 08/19/2010   Method used:   [facility] to        [facility]  [address] [id number]       [address]       [place], [place]  [address]       Ph: [phone] or [phone]       Fax: [phone]   RxID:   [id number] DOXYCYCLINE HYCLATE 100 MG CAPS (DOXYCYCLINE HYCLATE) 1 by mouth two times a day with a glass of water  #60 x 3   Entered and Authorized by:   Tresa Garter MD   Signed by:   Tresa Garter MD on 08/19/2010   Method used:   [facility] to          [facility]  [address] [id number]       [address]       [place], [place]  [address]       Ph: [phone] or [phone]       Fax: [phone]   RxID:   [id number]

## 2011-01-21 NOTE — Assessment & Plan Note (Signed)
Summary: fu--rs from 12/17/09---stc   Vital Signs:  Patient profile:   45 year old female Weight:      255 pounds Temp:     97.7 degrees F oral Pulse rate:   112 / minute BP sitting:   132 / 80  (left arm)  Vitals Entered By: Tora Perches (December 24, 2009 9:50 AM) CC: f/u Is Patient Diabetic? Yes   CC:  f/u.  History of Present Illness: The patient presents for a follow up of hypertension, diabetes, hyperlipidemia. C/o stress and irritability   Current Medications (verified): 1)  Procardia Xl 60 Mg  Tb24 (Nifedipine) .... Take 1 Tablet By Mouth Once A Day 2)  Metformin Hcl 1000 Mg Tabs (Metformin Hcl) .... Take 1 Tablet By Mouth Two Times A Day 3)  Xanax 0.5 Mg Tabs (Alprazolam) .[name].. 1 By Mouth Two Times A Day As Needed Anxiety 4)  Flexeril 5 Mg  Tabs (Cyclobenzaprine Hcl) .... Take 1 Tab By Mouth At Bedtime 5)  Vitamin D3 1000 Unit  Tabs (Cholecalciferol) .[name].. 1 Qd 6)  Ranitidine Hcl 150 Mg Caps (Ranitidine Hcl) .[name].. 1 Po Bid 7)  Advair Diskus 100-50 Mcg/dose Misc (Fluticasone-Salmeterol) .[name].. 1 Puff 2 Times Daily 8)  Wellbutrin Sr 150 Mg Xr12h-Tab (Bupropion Hcl) .[name].. 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   Nose:  External nasal examination shows no deformity or inflammation. Nasal mucosa are pink and moist without lesions or exudates. Mouth:  Oral mucosa and oropharynx without lesions or exudates.  Teeth in good repair. Neck:  No deformities, masses, or tenderness noted. Lungs:  Normal respiratory effort, chest expands symmetrically. Lungs are clear to auscultation, no  crackles or wheezes. Heart:  Normal rate and regular rhythm. S1 and S2 normal without gallop, murmur, click, rub or other extra sounds. Abdomen:  Bowel sounds positive,abdomen soft and non-tender without masses, organomegaly or hernias noted. Msk:  No deformity or scoliosis noted of thoracic or lumbar spine.   Neurologic:  No cranial nerve deficits noted. Station and gait are normal. Plantar reflexes are down-going bilaterally. DTRs are symmetrical throughout. Sensory, motor and coordinative functions appear intact. Skin:  R axilla boil small Psych:  Oriented X3 and good eye contact.  not suicidal and subdued.     Impression & Recommendations:  Problem # 1:  DIABETES MELLITUS, TYPE II (ICD-250.00) Assessment Comment Only  Her updated medication list for this problem includes:    Metformin Hcl 1000 Mg Tabs (Metformin hcl) .[name]... Take 1 tablet by mouth two times a day  Orders: TLB-TSH (Thyroid Stimulating Hormone) (84443-TSH) TLB-CBC Platelet - w/Differential (85025-CBCD) TLB-BMP (Basic Metabolic Panel-BMET) (80048-METABOL) TLB-A1C / Hgb A1C (Glycohemoglobin) (83036-A1C)  Problem # 2:  BOILS, RECURRENT (ICD-680.9) R axilla Assessment: Comment Only Doxy  Problem # 3:  ANXIETY (ICD-300.00) Assessment: Deteriorated  Her updated medication list for this problem includes:    Xanax 0.5 Mg Tabs (Alprazolam) .[name]... 1 by mouth two times a day as needed anxiety    Wellbutrin Sr 150 Mg Xr12h-tab (Bupropion hcl) .[name]... 1 by mouth bid    Celexa 10 Mg Tabs (Citalopram hydrobromide) .[name]... 1 by mouth qd  Orders: TLB-TSH (Thyroid Stimulating  Hormone) (84443-TSH) TLB-CBC Platelet - w/Differential (85025-CBCD) TLB-BMP (Basic Metabolic Panel-BMET) (80048-METABOL) TLB-A1C / Hgb A1C (Glycohemoglobin) (83036-A1C)  Problem # 4:  CONGESTIVE HEART FAILURE (ICD-428.0) Assessment: Comment Only Monitoring See "Patient Instructions".   Complete Medication List: 1)  Procardia Xl 60 Mg Tb24 (Nifedipine)  .... Take 1 tablet by mouth once a day 2)  Metformin Hcl 1000 Mg Tabs (Metformin hcl) .... Take 1 tablet by mouth two times a day 3)  Xanax 0.5 Mg Tabs (Alprazolam) .[name].. 1 by mouth two times a day as needed anxiety 4)  Flexeril 5 Mg Tabs (Cyclobenzaprine hcl) .... Take 1 tab by mouth at bedtime 5)  Vitamin D3 1000 Unit Tabs (Cholecalciferol) .[name].. 1 qd 6)  Ranitidine Hcl 150 Mg Caps (Ranitidine hcl) .[name].. 1 po bid 7)  Advair Diskus 100-50 Mcg/dose Misc (Fluticasone-salmeterol) .[name].. 1 puff 2 times daily 8)  Wellbutrin Sr 150 Mg Xr12h-tab (Bupropion hcl) .[name].. 1 by mouth bid 9)  Celexa 10 Mg Tabs (Citalopram hydrobromide) .[name].. 1 by mouth qd 10)  Doxycycline Monohydrate 100 Mg Caps (Doxycycline monohydrate) .[name].. 1 by mouth two times a day with a glass of water  Patient Instructions: 1)  Start taking a yoga class 2)  Try to eat more raw plant food, fresh and dry fruit, raw almonds, leafy vegetables, whole foods and less red meat, less animal fat. Poultry and fish is better for you than pork and beef. Avoid processed foods (canned soups, hot dogs, sausage, bacon , frozen dinners). Avoid corn syrup, high fructose syrup or aspartam and Splenda  containing drinks. Honey, Agave and Stevia are better sweeteners. Make your own  dressing with olive oil, wine vinegar, lemon juce, garlic etc. for your salads.  3)  Please schedule a follow-up appointment in 4 months. Prescriptions: FLEXERIL 5 MG  TABS (CYCLOBENZAPRINE HCL) Take 1 tab by mouth at bedtime  #30 x 6   Entered and Authorized by:   Tresa Garter MD   Signed by:   Tresa Garter MD on 12/24/2009   Method used:   Print then Give to Patient   RxID:   [id number] DOXYCYCLINE MONOHYDRATE 100 MG CAPS (DOXYCYCLINE MONOHYDRATE) 1 by mouth two times a day with a glass of water  #20 x 2   Entered and Authorized by:   Tresa Garter MD   Signed by:   Tresa Garter MD on 12/24/2009   Method used:   Print then Give to Patient   RxID:    [id number] XANAX 0.5 MG TABS (ALPRAZOLAM) 1 by mouth two times a day as needed anxiety  #60 x 6   Entered and Authorized by:   Tresa Garter MD   Signed by:   Tresa Garter MD on 12/24/2009   Method used:   Print then Give to Patient   RxID:   [id number] CELEXA 10 MG TABS (CITALOPRAM HYDROBROMIDE) 1 by mouth qd  #30 x 6   Entered and Authorized by:   Tresa Garter MD   Signed by:   Tresa Garter MD on 12/24/2009   Method used:   Print then Give to Patient   RxID:   [id number]

## 2011-01-23 ENCOUNTER — Other Ambulatory Visit: Payer: Self-pay

## 2011-01-29 NOTE — Assessment & Plan Note (Signed)
Summary: PHYSICAL---STC   Vital Signs:  Patient profile:   45 year old female Height:      67 inches Weight:      259 pounds BMI:     40.71 Temp:     99.1 degrees F oral Pulse rate:   76 / minute Pulse rhythm:   regular Resp:     16 per minute BP sitting:   120 / 68  (left arm) Cuff size:   large  Vitals Entered By: Lanier Prude, CMA(AAMA) (January 21, 2011 2:18 PM) CC: CPX Is Patient Diabetic? Yes   CC:  CPX.  History of Present Illness: The patient presents for a preventive health examination  C/o sinus congestion - yellow d/c C/o a lot of GERD - hard to swallow  Current Medications (verified): 1)  Procardia Xl 60 Mg  Tb24 (Nifedipine) .... Take 1 Tablet By Mouth Once A Day 2)  Xanax 0.5 Mg Tabs (Alprazolam) .[name].. 1 By Mouth Two Times A Day As Needed Anxiety 3)  Flexeril 5 Mg  Tabs (Cyclobenzaprine Hcl) .... Take 1 Tab By Mouth At Bedtime 4)  Vitamin D3 1000 Unit  Tabs (Cholecalciferol) .[name].. 1 Qd 5)  Ranitidine Hcl 150 Mg Caps (Ranitidine Hcl) .[name].. 1 Po Bid 6)  Advair Diskus 100-50 Mcg/dose Misc (Fluticasone-Salmeterol) .[name].. 1 Puff 2 Times Daily 7)  Wellbutrin Sr 150 Mg Xr12h-Tab (Bupropion Hcl) .[name].. 1 By Mouth Bid 8)  Celexa 10 Mg Tabs (Citalopram Hydrobromide) .[name].. 1 By Mouth Qd 9)  Hibiclens 4 % Liqd (Chlorhexidine Gluconate) .... Use For Shower Gd 10)  Mupirocin 2 % Oint (Mupirocin) .... Use Two Times A Day 11)  Janumet 50-500 Mg Tabs (Sitagliptin-Metformin Hcl) .[name].. 1 By Mouth Bid 12)  Lidocaine Hcl 2 % Gel (Lidocaine Hcl) .... 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  The patient denies weight loss, chest pain, hemoptysis, abdominal pain, anorexia, fever, vision loss, decreased hearing, hoarseness, syncope, dyspnea on exertion, peripheral edema, prolonged cough, headaches, melena, hematochezia, severe indigestion/heartburn, hematuria, incontinence, genital sores, muscle weakness, suspicious skin lesions, transient blindness, difficulty walking, depression, unusual weight change, abnormal bleeding, enlarged lymph nodes, angioedema, and breast masses.    Physical Exam  General:  overweight-appearing.   Head:  Normocephalic and atraumatic without obvious abnormalities. No apparent alopecia or balding. Eyes:  No corneal or conjunctival inflammation noted. EOMI. Perrla. Ears:  External ear exam shows no significant lesions or deformities.  Otoscopic examination reveals clear canals, tympanic membranes are intact bilaterally without bulging, retraction, inflammation or discharge. Hearing is grossly normal bilaterally. Mouth:  Oral mucosa and oropharynx without lesions or exudates.  Teeth in good repair. Neck:  No deformities, masses, or tenderness noted. Lungs:  Normal respiratory effort, chest expands symmetrically. Lungs are clear to auscultation, no crackles or wheezes. Heart:  Normal rate and regular rhythm. S1 and S2 normal without gallop, murmur, click, rub or other extra sounds. Abdomen:  Bowel sounds positive,abdomen soft and non-tender without masses,  organomegaly or hernias noted. Msk:  No deformity or scoliosis noted of thoracic or lumbar spine.   Neurologic:  No cranial nerve deficits noted. Station and gait are normal. Plantar reflexes are down-going bilaterally. DTRs are symmetrical throughout. Sensory, motor and coordinative  functions appear intact. Skin:  R axilla with 5-7 boils 5-10 mm some confluent and with communicating tracks; purulent d/c. Cx obtained Psych:  Oriented X3 and good eye contact.  not suicidal and subdued.     Impression & Recommendations:  Problem # 1:  ROUTINE GENERAL MEDICAL EXAM@HEALTH  CARE FACL (ICD-V70.0) Assessment New  Td Booster: Td (07/11/2009)   Flu Vax: Fluvax 3+ (01/21/2011)   Chol: 207 (07/09/2009)   HDL: 31.10 (07/09/2009)   LDL: DEL (04/12/2007)   TG: 546.0 (07/09/2009) TSH: 3.06 (12/24/2009)   HgbA1C: 5.7 (12/24/2009)    Discussed using sunscreen, use of alcohol, drug use, self breast exam, routine dental care, routine eye care, schedule for GYN exam, routine physical exam, seat belts, multiple vitamins, osteoporosis prevention, adequate calcium intake in diet, recommendations for immunizations, mammograms and Pap smears.  Discussed exercise and checking cholesterol.  Discussed gun safety, safe sex, and contraception.  Problem # 2:  BOILS, RECURRENT (ICD-680.9) Assessment: Improved  Problem # 3:  DEPRESSION (ICD-311) Assessment: Unchanged  The following medications were removed from the medication list:    Celexa 10 Mg Tabs (Citalopram hydrobromide) .[name]... 1 by mouth qd Her updated medication list for this problem includes:    Xanax 0.5 Mg Tabs (Alprazolam) .[name]... 1 by mouth two times a day as needed anxiety    Wellbutrin Sr 150 Mg Xr12h-tab (Bupropion hcl) .[name]... 1 by mouth bid    Citalopram Hydrobromide 20 Mg Tabs (Citalopram hydrobromide) .[name]... 1 by mouth qd  Problem # 4:  ANXIETY (ICD-300.00) Assessment: Unchanged  The following medications were removed from the medication list:    Celexa 10 Mg Tabs (Citalopram hydrobromide) .[name]... 1 by mouth qd Her updated medication list for this problem includes:    Xanax 0.5 Mg Tabs (Alprazolam) .[name]... 1 by mouth two times a day as needed anxiety    Wellbutrin Sr 150 Mg Xr12h-tab (Bupropion hcl) .[name]... 1 by mouth bid    Citalopram  Hydrobromide 20 Mg Tabs (Citalopram hydrobromide) .[name]... 1 by mouth qd  Problem # 5:  CONGESTIVE HEART FAILURE (ICD-428.0) Assessment: Improved  Problem # 6:  LIVER FUNCTION TESTS, ABNORMAL (ICD-794.8)  Advised patient to avoid alcohol and Tylenol. Call for worsening symptoms.   Problem # 7:  DIABETES MELLITUS, TYPE II (ICD-250.00) Assessment: Unchanged  Her updated medication list for this problem includes:    Janumet 50-500 Mg Tabs (Sitagliptin-metformin hcl) .[name]... 1 by mouth bid  Problem # 8:  TACHYCARDIA (ICD-785.0) Assessment: Deteriorated Increase Procardia dose See "Patient Instructions".   Complete Medication List: 1)  Procardia Xl 90 Mg Xr24h-tab (Nifedipine) .[name].. 1 by mouth qd 2)  Xanax 0.5 Mg Tabs (Alprazolam) .[name].. 1 by mouth two times a day as needed anxiety 3)  Flexeril 5 Mg Tabs (Cyclobenzaprine hcl) .... Take 1 tab by mouth at bedtime 4)  Wellbutrin Sr 150 Mg Xr12h-tab (Bupropion hcl) .[name].. 1 by mouth bid 5)  Hibiclens 4 % Liqd (Chlorhexidine gluconate) .... Use for shower gd 6)  Mupirocin 2 % Oint (Mupirocin) .... Use two times a day 7)  Janumet 50-500 Mg Tabs (Sitagliptin-metformin hcl) .[name].. 1 by mouth bid 8)  Lidocaine Hcl 2 % Gel (Lidocaine hcl) .... Use qid prn 9)  Omeprazole 40 Mg Cpdr (Omeprazole) .Marland KitchenMarland KitchenMarland Kitchen  1 by mouth qam for indigestion 10)  Amoxicillin 500 Mg Caps (Amoxicillin) .... 2 caps by mouth bid 11)  Citalopram Hydrobromide 20 Mg Tabs (Citalopram hydrobromide) .[name].. 1 by mouth qd 12)  Vitamin D3 1000 Unit Tabs (Cholecalciferol) .[name].. 1 qd  Other Orders: EKG w/ Interpretation (93000) Flu Vaccine 78yrs + (51884) Admin 1st Vaccine (16606)  Patient Instructions: 1)  Cut back on Dr Reino Kent 2)  Please schedule a follow-up appointment in 4 months. 3)  Labs this wk 4)  CBC, TSH, BMET, Hepatic panel, UA, Lipids, A1c, Vit B12 5)  Dx: V70.0, 401.1, 272.0, 790.29  780.79 Prescriptions: JANUMET 50-500 MG TABS (SITAGLIPTIN-METFORMIN HCL) 1 by mouth bid  #180 x 3    Entered and Authorized by:   Tresa Garter MD   Signed by:   Tresa Garter MD on 01/21/2011   Method used:   Print then Give to Patient   RxID:   [id number] WELLBUTRIN SR 150 MG XR12H-TAB (BUPROPION HCL) 1 by mouth bid  #180 x 1   Entered and Authorized by:   Tresa Garter MD   Signed by:   Tresa Garter MD on 01/21/2011   Method used:   Print then Give to Patient   RxID:   [id number] XANAX 0.5 MG TABS (ALPRAZOLAM) 1 by mouth two times a day as needed anxiety  #60 x 1   Entered and Authorized by:   Tresa Garter MD   Signed by:   Tresa Garter MD on 01/21/2011   Method used:   Print then Give to Patient   RxID:   [id number] PROCARDIA XL 90 MG XR24H-TAB (NIFEDIPINE) 1 by mouth qd  #90 x 3   Entered and Authorized by:   Tresa Garter MD   Signed by:   Tresa Garter MD on 01/21/2011   Method used:   Print then Give to Patient   RxID:   [id number] CITALOPRAM HYDROBROMIDE 20 MG TABS (CITALOPRAM HYDROBROMIDE) 1 by mouth qd  #90 x 3   Entered and Authorized by:   Tresa Garter MD   Signed by:   Tresa Garter MD on 01/21/2011   Method used:   Print then Give to Patient   RxID:   [id number] AMOXICILLIN 500 MG CAPS (AMOXICILLIN) 2 caps by mouth bid  #40 x 0   Entered and Authorized by:   Tresa Garter MD   Signed by:   Tresa Garter MD on 01/21/2011   Method used:   Print then Give to Patient   RxID:   [id number] OMEPRAZOLE 40 MG CPDR (OMEPRAZOLE) 1 by mouth qam for indigestion  #90 x 3   Entered and Authorized by:   Tresa Garter MD   Signed by:   Tresa Garter MD on 01/21/2011   Method used:   Print then Give to Patient   RxID:   [id number]    Orders Added: 1)  EKG w/ Interpretation [93000] 2)  Flu Vaccine 4yrs + [67619] 3)  Admin 1st Vaccine [90471] 4)  Est. Patient 40-64 years [50932]   Immunizations Administered:  Influenza Vaccine # 1:    Vaccine  Type: Fluvax 3+    Site: left deltoid    Mfr: Sanofi Pasteur    Dose: 0.5 ml    Route: IM    Given by: Lanier Prude, CMA(AAMA)    Exp. Date: 06/21/2011    Lot #: [id number]    VIS given: 07/16/10 version given  January 21, 2011.   Immunizations Administered:  Influenza Vaccine # 1:    Vaccine Type: Fluvax 3+    Site: left deltoid    Mfr: Sanofi Pasteur    Dose: 0.5 ml    Route: IM    Given by: Lanier Prude, CMA(AAMA)    Exp. Date: 06/21/2011    Lot #: [id number]    VIS given: 07/16/10 version given January 21, 2011.

## 2011-04-18 ENCOUNTER — Other Ambulatory Visit: Payer: Self-pay | Admitting: Internal Medicine

## 2011-05-06 NOTE — H&P (Signed)
NAMEMEREDETH, FURBER               ACCOUNT NO.:  [id number]   MEDICAL RECORD NO.:  [id number]          PATIENT TYPE:  INP   LOCATION:                                FACILITY:  [facility]   PHYSICIAN:  Huel Cote, M.D. DATE OF BIRTH:  [birth year]   DATE OF ADMISSION:  11/12/2007  DATE OF DISCHARGE:                              HISTORY & PHYSICAL   The patient is a 45 year old G3, P1-0-1-1 who comes in for a scheduled  elective repeat C-section at [redacted] weeks gestation, given a pregnancy  complicated by an infant diagnosed with Trisomy 48 and a previous C-  section with an unfavorable cervix.  The patient was given options as to  trial of VBAC; however, declines this option given that it could be a  prolonged induction and also would place undue stress on the baby which  she wishes to deliver I a viable state.  Her other prenatal issues are:  (1) History of postpartum cardiomyopathy approximately 6 weeks after her  last delivery for which she saw Dr. Jens Som at [facility].  She  has been followed by him this pregnancy with normal echocardiograms, the  last being approximately 2-3 weeks ago and has had no evidence of  residual heart failure.  Her last postpartum cardiomyopathy did  completely resolve, and an echocardiogram report is provided in her  prenatals.  (2) She had diabetes mellitus which has required treatment  with Glyburide 10 mg in the morning and 7.5 mg in the evening.  These  have well-maintained her blood sugars to fasting blood sugars of less  than 100 and 1-hour postprandials less than 120.  (3) She also has  chronic hypertension, and her blood pressures typically in the office  run 150s/90s.  She is taking her Procardia to control this, 60 mg XL  daily, and has really had no specific elevations in her blood pressure  on this medicine.  (4) Known Trisomy 18.  The patient had an increased  risk of Trisomy 18 on her first trimester screen and was counseled at  that time  for possible amniocentesis and other options; however, she  declined any intervention at that time.  On her anatomy ultrasound, it  was noted that the baby had a left cleft lip, and at that time she  revisited the issue of amniocentesis just for being aware of the  possibilities regarding the infant's delivery.  She eventually elected  to proceed with an amniocentesis at approximately [redacted] weeks gestation  which did confirm a Trisomy 85.  The patient has had multiple counseling  sessions regarding this and is very well-educated on what to expect  regarding this nonviable chromosomal anomaly.  She has met with genetic  counseling with Dr. Erik Obey and has also seen the maternal fetal  medicine specialist at [facility].  She met with NICU prior to delivery to  coordinate a care plan for her infant and really is just interested in  comfort care and spending as much quality time as possible with the  baby.  All of these issues have been addressed in her birth  plan, and we  will attempt to proceed as she wishes.   PAST OBSTETRICAL HISTORY:  As stated, significant for previous C-section  in 2005, 7 pounds 3 ounces, with the postpartum cardiomyopathy as noted.  She also had 1 spontaneous miscarriage after the fact.   PAST MEDICAL HISTORY:  1. She is diabetic with a history of polycystic ovary syndrome, placed      on Metformin and insulin prior to pregnancy.  2. Postpartum cardiomyopathy.  She has been followed up by cardiology      and is stable.   PAST SURGICAL HISTORY:  1. C-section in 2005.  2. Cholecystectomy.   PAST GYN HISTORY:  No abnormal Pap smears.   ALLERGIES:  NO KNOWN DRUG ALLERGIES.   CURRENT MEDICATIONS:  1. Procardia 60 mg p.o. daily.  2. Glyburide 10 mg q.a.m. and 7.5 mg q.p.m.   Although we have followed her prenatally for her blood sugars and her  blood pressures for her own health, we have not done any excessive fetal  monitoring per her wishes given the nonviable  diagnosis for the baby.  The patient was also placed on Wellbutrin 150 mg SR b.i.d. during the  pregnancy, given some increased stress with the family situation and the  diagnosis of the baby.   PRENATAL LABS:  A positive, antibody negative, RPR nonreactive, rubella  equivocal, hepatitis B surface antigen negative, HIV negative, GC  negative, chlamydia negative.   PHYSICAL EXAMINATION:  VITAL SIGNS:  Her weight is 280 pounds.  Blood  pressure 150/90.  CARDIAC:  Regular rate and rhythm.  LUNGS:  Clear.  ABDOMEN:  Soft and nontender and gravid, with an estimated fetal weight  of 6-7 pounds.  PELVIC:  Cervix is long and closed.   As previously stated, the patient was given many options regarding  delivery, and it was certainly discussed with her that we could proceed  with VBAC given the nonviable state of the baby.  It was discussed with  her that this could be a long process given issues with her cervix being  unfavorable.  She is really most desirous of having a little time to  spend with her baby which may be limited and does not want to place any  undue stress on the baby and wishes to proceed with a repeat C-section  which is certainly reasonable.  The risk and benefits of C-section were  addressed with the patient, including bleeding, infection, and possible  damage to bowel and bladder.  She understands these risks and desires to  proceed with the surgery as stated.  We have extensively gone over her  birth plan, and plans have been made for the patient to be allowed to  recover in a room where she can be with her baby should the baby not do  well immediately following surgery.  She has met with NICU, and they  understand her wishes as far as support goes and will evaluate the baby  at the time of C-section.      Huel Cote, M.D.  Electronically Signed     KR/MEDQ  D:  11/11/2007  T:  11/11/2007  Job:  [id number]

## 2011-05-06 NOTE — Op Note (Signed)
[name] [name], [name] [name]               ACCOUNT NO.:  [id number]   MEDICAL RECORD NO.:  [id number]          PATIENT TYPE:  AMB   LOCATION:  SDC                           FACILITY:  [facility]   PHYSICIAN:  Malachi Pro. Ambrose Mantle, M.D. DATE OF BIRTH:  Jun 22, 1966   DATE OF PROCEDURE:  DATE OF DISCHARGE:                               OPERATIVE REPORT   PREOPERATIVE DIAGNOSIS:  Intrauterine pregnancy nonviable with  hemorrhage.   POSTOPERATIVE DIAGNOSIS:  Intrauterine pregnancy nonviable with  hemorrhage.   OPERATION:  Suction D&C.   OPERATOR:  Malachi Pro. Ambrose Mantle, MD   General anesthesia.   The patient was brought to the operating room and placed under  satisfactory heavy sedation.  She was placed in the lithotomy position.  Exam revealed the cervix to be slightly open.  The lower uterine segment  was bulging.  There was a clot in the cervix.  Uterus felt to be upper  limit of normal size.  The adnexa were free of masses.  The vulva,  vagina, and perineum were prepped with Betadine solution and draped as a  sterile field.  A speculum was inserted.  The anterior cervix was  injected with 1% Xylocaine 2 or 3 mL and grasped with a tenaculum.  A 4  mL were injected at 4 and 8 o'clock.  The cervix was already somewhat  dilated.  I did not use any additional dilators.  I used a ring forceps  to enter the endometrial cavity and bluntly removed a large amount of  the products of conception.  During this part of the procedure, the  membranes ruptured.  I did not see in embryo.  This was consistent with  not having seen and embryo by ultrasound 2-1/2 weeks before.  After I  debulked most of the placenta and had previously sounded it to 11 cm  anteriorly, I used a #8 curved suction curette to curette the remainder  of the endometrial cavity.  I then used a sharp D&C to try to be sure  that the cavity was smooth.  I did a final circuit with small ring  forceps and then with the suction and procedure was  terminated.  The  patient seemed to tolerate the procedure well.  Blood loss was about 50  mL.  Sponge and needle counts were correct, and she was returned to  recovery in satisfactory condition.  The patient states her blood type  is A+.  We will check it to be sure if she does not need MICRhoGAM.      Malachi Pro. Ambrose Mantle, M.D.  Electronically Signed     TFH/MEDQ  D:  07/02/2008  T:  07/03/2008  Job:  [id number]

## 2011-05-06 NOTE — Assessment & Plan Note (Signed)
[facility]                            CARDIOLOGY OFFICE NOTE   [name] [name], [name] [name] [name] [name]                      MRN:          [id number]  DATE:04/28/2007                            DOB:          [birth year]    [name] [name] is a 45 year old female who I have not seen since April 2007.  Note, she had a peripartum cardiomyopathy in April 2006.  Note, she did  have a cardiac catheterization performed in June 2005 that showed an  ejection fraction of 25% to 30% with normal coronary arteries.  She was  treated medically.  However, these medications were discontinued by  herself.  We did perform a repeat echocardiogram in May 2007 that showed  normal LV function, and mild left atrial enlargement.  Since I last saw  her, she has not had dyspnea, chest pain, palpitations, or syncope, and  there is no pedal edema.  However, she did recently become pregnant.  She, apparently, was planning on a hysterectomy this summer, did not  feel she could become pregnant due to polycystic ovarian disease.  She  has not had any problems since finding out she was pregnant.   Her medications include prenatal vitamin only.   PHYSICAL EXAMINATION:  Shows a blood pressure of 128/82, and her pulse  is 108.  She weighs 256 pounds.  She is well developed, and somewhat obese.  She is in acute distress.  SKIN:  Warm and dry.  NECK:  Supple without no jugular venous distention.  CHEST:  Clear.  CARDIOVASCULAR EXAM:  Regular rate and rhythm with a normal S1 and S2.  I could not  appreciate murmurs, rubs, or gallops.  ABDOMINAL EXAM:  Benign.  EXTREMITIES:  Showed no edema.  Her electrocardiogram shows a sinus rhythm at a rate of 108.  There is a  slight right axis deviation.  There is poor R wave progression.   DIAGNOSES:  1. History of peripartum cardiomyopathy, improved on most recent      echocardiogram in May 2007.  2. Nine weeks pregnant today.  3. History of mild increased liver  functions.  4. Polycystic ovarian disease.  5. Diabetes mellitus.   PLAN:  [name] [name] has a history of peripartum cardiomyopathy, which is  improved on her most recent echocardiogram.  She will be at increased  risk developing recurrent peripartum cardiomyopathy now that she is  pregnant again.  I have explained this.  For now, we will not begin any  medications.  I will check an echocardiogram to quantify her left  ventricular function at baseline.  I have asked her to contact us if she  develops worsening volume overload/dyspnea.  We may need to resume her beta blocker towards the end of her pregnancy.  We will follow her closely for any recurrences.  She will see [place] back in  4 months.     [name] Frieze Jens Som, MD, [facility]  Electronically Signed    BSC/MedQ  DD: 04/28/2007  DT: 04/28/2007  Job #: [id number]

## 2011-05-06 NOTE — Op Note (Signed)
[name] [name], [name] [name]               ACCOUNT NO.:  [id number]   MEDICAL RECORD NO.:  [id number]          PATIENT TYPE:  INP   LOCATION:  9312                          FACILITY:  [facility]   PHYSICIAN:  Huel Cote, M.D. DATE OF BIRTH:  August 07, 1966   DATE OF PROCEDURE:  11/12/2007  DATE OF DISCHARGE:                               OPERATIVE REPORT   PREOPERATIVE DIAGNOSES:  1. Term pregnancy at 37 weeks, delivered.  2. Previous cesarean section, declines vaginal birth after cesarean      delivery and unfavorable cervix.  3. 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  4. Gestational diabetes.  5. Chronic hypertension.   POSTOPERATIVE DIAGNOSES:  1. Term pregnancy at 37 weeks, delivered.  2. Previous cesarean section, declines vaginal birth after cesarean      delivery and unfavorable cervix.  3. 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  4. Gestational diabetes.  5. Chronic hypertension.   PROCEDURE:  Repeat low transverse cesarean section.   SURGEON:  Huel Cote, M.D.   ASSISTANT:  Zenaida Niece, M.D.   ANESTHESIA:  Spinal.   FLUIDS:  Estimated blood loss 800 mL, IV fluid 2200 mL LR, urine output  175 mL clear urine.   FINDINGS:  There was a viable female infant in the vertex presentation.  Apgars were 2 and 8.  Weight was 6 pounds 4 ounces.  Normal uterus,  tubes and ovaries were noted.   PROCEDURE:  The patient was taken to the operating room, where spinal  anesthesia was obtained without difficulty.  She was then prepped and  draped in the normal sterile fashion in the dorsal supine position with  a leftward tilt.  A Pfannenstiel skin incision was then made through a  preexisting scar and carried through to the underlying layer of fascia  by sharp dissection and Bovie cautery.  The fascia was then opened in  the midline and the incision extended laterally with Mayo scissors.  The  inferior aspect was grasped with Kocher clamps, elevated and  dissected  off the rectus muscles.  Superior aspect was elevated and dissected off  the rectus muscles.  The rectus muscles were noted to be slightly  separated and the peritoneal cavity was entered superiorly.  The omentum  was adhesed to the anterior peritoneal wall and had to be taken down in  two areas to access the lower uterine segment.  It still had some  adhesions above the area where we were working that were not taken down  as they were dense in nature.  However, it could be adequately pushed  away from the uterus for visualization.  The lower uterine segment was  nicely exposed with an Alexis retractor placed within and the lower  uterine segment was incised in a transverse fashion.  The cavity itself  was entered bluntly and large amount of fluid noted consistent with  polyhydramnios.  The infant's head was then delivered atraumatically.  A  cleft lip was noted as was previously known on ultrasound and the baby  did try a weak spontaneous cry.  The remainder of the infant's  body was  delivered, the cord clamped, and the infant was handed off to the  waiting pediatricians.  She responded well to stimulation and was  breathing on her own and then the NICU team was able to assess her and  leave her in the room with the parents.  The placenta then delivered,  was expressed spontaneously, the uterus cleared of all clots and debris  with a moistened lap sponge.  The uterine incision was then closed with  0 chromic in a running locked fashion in two layers.  Good hemostasis  was noted.  The uterus and tubes and ovaries were inspected and all  found to be hemostatic.  Again, all again was inspected and no active  bleeding noted.  The Alexis retractor was removed from the abdomen and  the omentum and bowel were quite protuberant and trying to push through  the rectus muscles.  For this reason the rectus muscles were  reapproximated with several interrupted sutures of 0 Vicryl.  The  fascia  was then closed with 0 Vicryl in a running fashion.  Subcutaneous tissue  was reapproximated with 3-0 plain for closure of the dead space in the  wound and the skin was closed with staples.  Again all sponge, lap and  needle counts were correct x2 and the patient was taken to labor and  delivery to recover in a private room where she could be with the infant  and her family, given the prognosis for the child.      Huel Cote, M.D.  Electronically Signed     KR/MEDQ  D:  11/12/2007  T:  11/13/2007  Job:  [id number]

## 2011-05-06 NOTE — Discharge Summary (Signed)
[name] [name], [name] [name]               ACCOUNT NO.:  [id number]   MEDICAL RECORD NO.:  [id number]          PATIENT TYPE:  INP   LOCATION:  9312                          FACILITY:  [facility]   PHYSICIAN:  Huel Cote, M.D. DATE OF BIRTH:  1966/01/14   DATE OF ADMISSION:  11/12/2007  DATE OF DISCHARGE:  11/15/2007                               DISCHARGE SUMMARY   DISCHARGE DIAGNOSES:  1. Term pregnancy at 37 weeks, delivered.  2. Previous cesarean section with unfavorable cervix, declining      vaginal birth after cesarean section, for induction of labor.  3. 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  4. Diabetes mellitus.  5. History of cardiomyopathy, post partum.  6. Status post repeat low transverse cesarean section.   DISCHARGE MEDICATIONS:  1. Motrin 600 mg p.o. every 6 hours.  2. Percocet 1-2 tablets p.o. every 4 hours PRN.  3. Glyburide 5 mg p.o. twice daily.  4. Procardia 60 mg XL daily.   DISCHARGE FOLLOW UP:  The patient has several follow up appointments  pending.  She will return to the office in 2 days for staple removal.  She will also continue her Accu-Cheks and report those to [place].  She has a  scheduled visit with Cardiology for a repeat echocardiogram to ensure no  post partum cardiomyopathy recurs.  Also, with the baby, plans are  underway to see if the baby can be discharged to home with the patient.  They are currently stable.  The baby does have issues with some heart  failure and, given the diagnosis, it is assumed will have limited time  before death and the patient is working with [facility] to see if home  care can be arranged.   HOSPITAL COURSE:  The patient is a 45 year old G3, P 1-0-1-1, who is  coming in for a scheduled repeat transverse cesarean section on the 21st  after careful arrangements had been made with all care providers.  The  patient's pregnancy had been complicated by a diagnosis of trisomy 53  and she was well aware of the impact of this  nonviable diagnosis.  Because her cervix remained unfavorable, long, and closed, and she was a  previous cesarean section we had discussed all options of delivery and  she declined any long induction process which was certainly reasonable.  The patient underwent a repeat low transverse cesarean section and  delivered a viable female infant with a visible cleft lip which was  previously diagnosed.  Apgars were 2 and 8.  Weight was 6 pounds 4  ounces.  The patient had normal tubes, and ovaries note at the time of  cesarean section.  All care was in place and the infant was taken to the  NICU after delivery within the first 24 hours.  The infant remained in  post partum care of the parents for approximately 4-5 hours with  visitation of multiple family members and when the infant became tired  and required some supplemental oxygen did go to the NICU.  The patient  then remained inhouse for her care visiting with the baby often  and  spending as much time with her as possible. Her blood sugars were  initially slightly elevated. However, when her Glyburide was given  appropriately they were stable.  Blood pressures were stable.  Hemoglobin after delivery was 10.6, down from 12.8.   She stayed inhouse until post partum day #3 at which point she decided  to go home to spend time with her 43 year old.  Blood sugars were  reasonable on Glyburide b.i.d. and her Procardia was controlling her  blood pressure well.  We discussed in detail, despite all the emotional  upheaval it was necessary that she continue to care for herself with  Accu-Cheks and follow up appointments with Cardiology.  The patient was  already on Wellbutrin for help with her depression and the overwhelming  nature of the baby's diagnosis.  They are working with NICU with [facility] to determine if the baby is a candidate for home care should she  live long enough to do so.  Currently the baby is stable with minimal  support,  however, does have some issues with heart failure.   The patient was discharged to home with close follow up planned in the  office.  For her complete history and physical please see previously  dictated H and P.      Huel Cote, M.D.  Electronically Signed     KR/MEDQ  D:  12/17/2007  T:  12/17/2007  Job:  [id number]

## 2011-05-06 NOTE — Consult Note (Signed)
[facility]                          ENDOCRINOLOGY CONSULTATION   INEZE, SERRAO                      MRN:          [id number]  DATE:05/04/2007                            DOB:          Jan 10, 1966    REFERRING PHYSICIAN:  [name]. [name], MD   REASON FOR REFERRAL:  Diabetes in pregnancy.   HISTORY OF PRESENT ILLNESS:  A 45 year old woman who had gestational  diabetes with a 2005 pregnancy, for which she took insulin.  Since then,  she has taken Glucophage for borderline diabetes.  Her glucose has  been very well controlled, according to her A1c's, but she is not  recently checking her glucoses.  She is now [redacted] weeks pregnant.  She has  gained 4 pounds over the 9 weeks, and she has some associated cramping  in her legs, but no numbness.   PAST MEDICAL HISTORY:  Cardiomyopathy after her 2005 pregnancy.   SOCIAL HISTORY:  She is married.  She owns a day care facility.   FAMILY HISTORY:  Negative for diabetes.   REVIEW OF SYSTEMS:  Denies the following:  Fever, nausea, vomiting, but  she does have slight urinary frequency.   PHYSICAL EXAMINATION:  VITAL SIGNS:  Blood pressure 138/85, heart rate  111, temperature 99.0, weight 259.  GENERAL:  Obese.  SKIN:  Not diaphoretic.  No rash.  HEENT:  No proptosis.  No periorbital swelling.  Pharynx is normal.  NECK:  Supple.  No goiter.  CHEST:  Clear to auscultation.  No respiratory distress.  CARDIOVASCULAR:  There is 1+ bilateral pretibial edema.  Regular rate  and rhythm.  No murmur.  Pedal pulses are intact.  EXTREMITIES:  Feet normal color and temperature.  There is no ulcer  present on the feet.  NEUROLOGIC:  Alert and oriented.  Does not appear anxious nor depressed.  Gait is observed in the office to be normal, and sensation is intact to  touch on the feet.   LABORATORY STUDIES:  Hemoglobin A1c 5.2 on April 12, 2007 on the  metformin, which she has since discontinued.   IMPRESSION:  1. Gestational diabetes.  Given this A1c on metformin, she probably      should be considered to have type 2 diabetes.  2. Nine weeks gestation.  3. Slight weight gain and muscle cramps.   PLAN:  1. Check TSH.  2. We discussed the risk of diabetes, especially during pregnancy.  3. I gave her a prescription for a Humalog pen.  She will check her      glucoses and start the Humalog several units q.a.c. if necessary,      and she will return next week.     Sean A. Everardo All, MD  Electronically Signed    SAE/MedQ  DD: 05/04/2007  DT: 05/05/2007  Job #: [id number]   cc:   [name]. [name], MD  Huel Cote, M.D.

## 2011-05-06 NOTE — Assessment & Plan Note (Signed)
[facility]                            CARDIOLOGY OFFICE NOTE   Jinnifer, Montejano MORNA FLUD                      MRN:          [id number]  DATE:01/19/2009                            DOB:          [birth year]    [name] [name] is a very pleasant 45 year old female who I have seen in the  past for peripartum cardiomyopathy.  This occurred in 2006.  However, LV  function returned to normal.  She did become pregnant again, but lost  her second child due to Trisomy 67.  We last repeated an echocardiogram  on December 21, 2007.  At that time, she had normal LV function and mild-  to-moderate left atrial enlargement.  Since I last saw her, she states  that she will have a sensation occasionally where she feels like she  cannot take a deep breath.  However, she attributes this to anxiety as  it began at the anniversary of her second child death.  She also  recently lost her grandmother.  She denies any dyspnea on exertion,  orthopnea, PND, pedal edema, palpitations, presyncope, syncope, or chest  pain.   MEDICATIONS:  1. Procardia 60 mg p.o. daily.  2. Metformin 1000 mg p.o. daily.   PHYSICAL EXAMINATION:  VITAL SIGNS:  Blood pressure 152/102, but a  recheck was 152/85.  Her pulse is 101.  She weighs 255 pounds.  HEENT:  Normal.  NECK:  Supple.  CHEST:  Clear.  CARDIOVASCULAR:  Tachycardic rate with regular rhythm.  ABDOMEN:  No tenderness.  EXTREMITIES:  No edema.   Electrocardiogram shows sinus tachycardia at rate of 106.  There are no  significant ST changes noted.   DIAGNOSES:  1. History of peripartum cardiomyopathy - Ms. Kovacevic is euvolemic on      examination today and her last echocardiogram showed normal left      ventricular function.  We will not pursue this further.  She is not      sure whether she will try to become pregnant again, but thinks it      is unlikely.  I have again explained to her the risk of peripartum      cardiomyopathy with  recurrent pregnancies.  2. History of polycystic ovarian disease.  3. History of diabetes mellitus.  4. History of mildly elevated liver functions - this will be managed      per her primary care physician.   We will see her back on an as-needed basis.     Madolyn Frieze Jens Som, MD, [facility]  Electronically Signed    BSC/MedQ  DD: 01/19/2009  DT: 01/20/2009  Job #: [id number]

## 2011-05-06 NOTE — Assessment & Plan Note (Signed)
[facility]                            CARDIOLOGY OFFICE NOTE   [name] [name], [name] [name] [name] [name]                      MRN:          [id number]  DATE:12/09/2007                            DOB:          1966/08/09    [name] [name] is a very pleasant 45 year old female, whom I have seen in  the past for peripartum cardiomyopathy in April of 2006.  She did become  pregnant again.  We repeated her echocardiogram on November 4 prior to  delivering her most recent child.  This showed normal LV function.  Her  left atrium was mildly to moderately dilated.  She delivered in late  November.  Note:  She lost her daughter due to trisomy 63.  She,  however, states that she has felt well with no dyspnea, chest pain,  palpitations or syncope.  There is no pedal edema.  She is having a hard  time with the child's death at this point.   Her present medications include Wellbutrin, Protonix and Procardia 60 mg  p.o. daily.   PHYSICAL EXAM TODAY:  Shows a blood pressure of 130/70 and her pulse is  89.  She weighs 254 pounds.  HEENT:  Normal.  NECK:  Supple.  CHEST:  Clear.  CARDIOVASCULAR EXAM:  Reveals a regular rate and rhythm.  ABDOMINAL EXAM:  Shows no tenderness.  EXTREMITIES:  Show no edema.   DIAGNOSES:  1. History of peripartum cardiomyopathy - she just delivered and she      has had no heart failure symptoms.  We will plan to repeat her      echocardiogram to make sure that her LV function has not      deteriorated.  Note:  I have explained to her that the risk of      peripartum cardiomyopathy with recurrent pregnancies is much      higher.  I have recommended that she undergo tubal ligation and      that her husband undergo vasectomy.  She is contemplating this      issue.  2. History of polycystic ovarian disease.  3. History of diabetes mellitus.  4. History of mildly elevated liver functions.   I will see her back in approximately 12 months or sooner if  necessary.     Madolyn Frieze Jens Som, MD, [facility]  Electronically Signed    BSC/MedQ  DD: 12/09/2007  DT: 12/10/2007  Job #: [id number]

## 2011-05-06 NOTE — Assessment & Plan Note (Signed)
[facility]                            CARDIOLOGY OFFICE NOTE   [name] [name], [name] [name]                      MRN:          [id number]  DATE:09/14/2007                            DOB:          02-14-1966    [name] [name] is a 45 year old female, who has a history of a peripartum  cardiomyopathy in April of 2006.  However, this did improve.  I saw her  in May and, at that time, she informed me that she had become pregnant  again.  We did repeat her echocardiogram on May 19, 2007.  At that time,  her LV function was vigorous with an ejection fraction of 65-70%.  There  was trivial mitral regurgitation.  There was moderate left atrial  enlargement.  She is now [redacted] weeks pregnant.  She denies any increased  orthopnea, PND, pedal edema, palpitations, presyncope, syncope or chest  pain.  She has mild dyspnea on exertion.   MEDICATIONS:  Include Prevacid, prenatal vitamin and Glyburide.   PHYSICAL EXAM:  Her physical exam today shows a blood pressure that is  mildly elevated at 149/88.  Her pulse is 107.  HEENT:  Normal.  NECK:  Supple.  CHEST:  Clear.  CARDIOVASCULAR EXAM:  Reveals a regular rate and rhythm.  ABDOMINAL EXAM:  Significant for intrauterine pregnancy.  EXTREMITIES:  Show trace edema.   Her electrocardiogram shows sinus rhythm at a rate of 102.  The axis is  normal.  There are no ST changes noted.   DIAGNOSES:  1. History of peripartum cardiomyopathy, improved by most recent      echocardiogram in May of this year.  2. Twenty-eight weeks pregnant today.  3. Polycystic ovarian disease.  4. History of diabetes mellitus.  5. History of mildly elevated liver functions.   PLAN:  [name] [name] appears to be doing well from symptomatic standpoint.  We will continue close observation at this point.  If she develops a  peripartum cardiomyopathy, we will treat as indicated.  Note:  I will  check an echocardiogram in six weeks to reassess her LV  function.  I  will see her immediately after that.  I have asked her to contact us if  she develops any symptoms of worsening volume overload, such as dyspnea,  orthopnea, or increasing pedal edema.     Madolyn Frieze Jens Som, MD, [facility]  Electronically Signed   BSC/MedQ  DD: 09/14/2007  DT: 09/15/2007  Job #: [id number]

## 2011-05-09 NOTE — Discharge Summary (Signed)
[name] [name], [name] [name]                         ACCOUNT NO.:  [id number]   MEDICAL RECORD NO.:  [id number]                   PATIENT TYPE:  INP   LOCATION:  4728                                 FACILITY:  [facility]   PHYSICIAN:  Olga Millers, M.D. [facility]            DATE OF BIRTH:  07-12-1966   DATE OF ADMISSION:  06/15/2004  DATE OF DISCHARGE:  06/18/2004                                 DISCHARGE SUMMARY   DISCHARGE DIAGNOSES:  1. Admitted with congestive heart failure symptoms consisting of increased     dyspnea on exertion, orthopnea, paroxysmal nocturnal dyspnea,  lower     extremity edema, and chest tightness increasing with inspiration.  Chest     x-ray showing cardiomegaly and mild edema.  2. CT study bilateral pleural effusions and moderate pericardial effusion.  3. 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  Delivery of first child     May 02, 2004.  4. Ejection fraction 25 to 30%.  Echocardiogram June 17, 2004.  5. Left heart catheterization June 17, 2004.  Coronary anatomy without     obstruction, ejection fraction 25 to 30%, global hypokinesis.   SECONDARY DIAGNOSES:  1. Depression.  2. Polycystic ovary syndrome on Glucophage.  3. Recurrent headaches on Vicodin.   DISPOSITION:  [name] is ready for discharge June 28.  She is  achieving 92% oxygen saturation on room air.  Her blood pressure is well  controlled on digoxin, Altace, and Coreg.  She is tolerating Lasix diuresis  with a creatinine of 0.7 on the day of discharge.  She is alert and  oriented.  She has had no respiratory compromise.  She has been ruled out  for myocardial infarction,  She goes home with the following medications.   DISCHARGE MEDICATIONS:  1. Digoxin 0.25 mg daily.  2. Enteric-coated aspirin 81 mg daily.  3. Altace 5 mg twice daily.  4. Coreg 3.125 mg twice daily.  5. Lasix 40 mg daily.  6. Potassium chloride 20 mEq daily.  7. Zoloft 50 mg daily.  8. Glucophage 750 mg 1 tablet in  the morning, 1 tablet in the evening.  9. The patient is strongly counseled to undertake oral contraception.  10.      Vicodin 5/500 one to two tablets every 4 to 6 hours as needed for     headache pain.   ACTIVITY:  No particular restrictions.   DIET:  She is asked to restrict fluid intake to 30.5 liter bottles of fluid  a day.  Discharge diet is low-sodium diet.  She is asked to weigh herself  daily and to call the office at [phone] if she experiences a 2-pound gain  in weight over a 24-hour period.   DISCHARGE PLANNING:  1. Laboratory studies at [facility], [address]     Tuesday, July 5 for BNP and a BMET.  2. Echocardiogram Tuesday, July 12 at 11:30 in the  morning at [facility].  3. Office visit with Dr. Jens Som Thursday, July 18, 2004, at 10:45 in the     morning.   PROCEDURES:  1. Echocardiogram June 17, 2004:  Ejection fraction 25 to 35%, moderate to     marked decrease in left ventricular function, moderate mitral     regurgitation, moderate pericardial effusion circumferential to the heart     with mild right atrium chamber collapse.  2. Left heart catheterization June 17, 2004.  This study shows normal     coronary anatomy with ejection fraction 25 to 30% with global     hypokinesis.   BRIEF HISTORY:  [name] is a 45 year old female.  She has a past medical  history of gestational diabetes.  She presents with new onset congestive  heart failure symptoms.  The patient delivered her first child May 02, 2004,  by cesarean section.  This was an uncomplicated delivery.  Over the past  three weeks, she has noted progressive dyspnea on exertion, and in the last  week she has had orthopnea, PND, and pedal edema.  She also describes chest  tightness increasing with inspiration.  She has had no palpitations,  presyncope, or syncope.  She denies hemoptysis, fever, chills, or productive  cough.  She was seen in [facility] on June 25. Chest  x-ray showed cardiac  enlargement, mild edema.  A CT scan showed bilateral pleural effusions,  probable edema, as well as moderate pericardial effusion.  She was seen by  Dr. Olga Millers in consultation.  The plan is to admit the patient, start  treating with IV Lasix, digoxin, as well as ACE inhibition.  Beta blocker  will be added beginning with 3.25 mg b.i.d. Coreg.  Also aspirin and with  deep vein thrombosis prophylaxis on Lovenox.  Renal function will be  followed carefully.  Echocardiogram will be planned to follow pericardial  effusion, and she will have right and left heart catheterization.   HOSPITAL COURSE:  After admission to [facility] and consultation by  Dr. Olga Millers, the patient was started on IV Lasix 20 mg IV b.i.d.,  also ACE inhibitor, digoxin, and aspirin, as well as Lovenox.  The ACE  inhibitor was increased from 5 mg daily to 5 mg b.i.d.  Lovenox was stopped  prior to left heart catheterization.  Echocardiogram, as dictated above,  shows markedly decreased left ventricular function.  Left heart  catheterization shows normal coronaries with global hypokinesis.  The  patient tolerated all of these procedures well.  She has done well with  diuresis.  Lasix was changed from IV to oral on June 27.  She is to be  discharged postprocedure day #1 with followup with Dr. Jens Som in three  weeks.   DISCHARGE LABORATORY DATA:  Complete blood count June 28: White cells 9.6,  hemoglobin 11.9, hematocrit 35.5, platelets 294.  Serum electrolytes on June  28: Sodium 140, potassium 4, chloride 105, bicarbonate 27, glucose 101, BUN  10, creatinine 0.7.  HCG quantitative was obtained on June 27:  Less than 2,  normal range is less than 5.  Thyroid-stimulating hormone is 2.391.      Maple Mirza, P.A.                    Olga Millers, M.D. [facility]    GM/MEDQ  D:  06/18/2004  T:  06/18/2004  Job:  [id number]   cc:   Olga Millers, M.D.  [facility]  Aleksei V. [name],  M.D. [facility]. Lily Peer, M.D.  [address], [address]  [place]  [place] [address]  Fax: [phone]   Mliss Sax, M.D.

## 2011-05-09 NOTE — Discharge Summary (Signed)
[name], FRIEND               ACCOUNT NO.:  [id number]   MEDICAL RECORD NO.:  [id number]          PATIENT TYPE:  OIB   LOCATION:  5727                         FACILITY:  [facility]   PHYSICIAN:  Gabrielle Dare. [name], M.D.DATE OF BIRTH:  09/09/1966   DATE OF ADMISSION:  07/08/2005  DATE OF DISCHARGE:  07/09/2005                                 DISCHARGE SUMMARY   DISCHARGE DIAGNOSES:  1.  Symptomatic cholelithiasis.  2.  Status post laparoscopic cholecystectomy with intraoperative      cholangiogram.   ATTENDING:  Gabrielle Dare. [name], M.D.   HISTORY OF PRESENT ILLNESS:  The patient is a 45 year old female with a  history of previous attack of acute cholecystitis who presented for elective  cholecystectomy.   HOSPITAL COURSE:  The patient underwent an uncomplicated laparoscopic  cholecystectomy with intraoperative cholangiogram.  Her cholangiogram  demonstrated no common bile duct filling defects.  Her  mild elevation of  liver enzymes is thought to be due to fatty infiltration of her liver.  Postoperatively, she remained afebrile and hemodynamically stable.  She  tolerated a gradual advancement of her diet and had an uncomplicated  postoperative course.  She was discharged home on postoperative day #1.   DISCHARGE DIET:  Low-fat.   DISCHARGE ACTIVITY:  No lifting.   DISCHARGE MEDICATIONS:  Percocet 5/325 one to two p.o. q.6 h. p.r.n. pain.   FOLLOWUP:  Followup is in 3 weeks with myself.       BET/MEDQ  D:  07/09/2005  T:  07/09/2005  Job:  [id number]   cc:   [name]. [name], M.D. [facility]

## 2011-05-09 NOTE — H&P (Signed)
[name] [name], LELAND               ACCOUNT NO.:  [id number]   MEDICAL RECORD NO.:  [id number]          PATIENT TYPE:  EMS   LOCATION:  ED                           FACILITY[facility]  [facility]   PHYSICIAN:  Velora Heckler, MD      DATE OF BIRTH:  05-02-1966   DATE OF ADMISSION:  05/26/2005  DATE OF DISCHARGE:                                HISTORY & PHYSICAL   REFERRING PHYSICIAN:  [facility].   CHIEF COMPLAINT:  Rule out acute cholecystitis.   HISTORY OF PRESENT ILLNESS:  Patient is a 45 year old white female from  [place], [place].  She reports an approximately two-week  history of febrile illness with headache and neck pain.  She has noted fever  as high as 104.5 degrees at home.  The patient noted onset of nausea within  the last 24 hours and associated upper abdominal pain.  She has had normal  soft bowel movements.  She has noted dark urine.  The patient was evaluated  last week at [facility]  She was initially diagnosed with pyelonephritis  and placed on Levaquin.  On June 1, at the time of her usual assessment at  [facility], her white count was 8.4.  Platelet count was noted to be low at  83,000.  The patient was subsequently seen back at [facility] on June 5th.  Her white count remained normal at 9.5.  Platelet count was abnormally low  at 68,000.  The patient was sent to [facility] on the day of  admission, where she underwent abdominal ultrasound.  This showed fatty  infiltration of the liver.  Gallbladder was thickened with pericholecystic  fluid and a single intraluminal gallstone.  Spleen was prominent, measuring  18 cm in maximum dimension.  This was consistent with splenomegaly.  Tentative diagnosis of acute cholecystitis was made, and the patient was  referred to surgery for evaluation.  The patient denies any history of  jaundice or acholic stools.  She denies any previous hepatobiliary or  pancreatic disease.  She denies any history  of hepatitis.  The patient is  now admitted on the general surgical service for further evaluation.   PAST MEDICAL HISTORY:  History of childbirth one year ago, complicated by  peripartum cardiomyopathy.  Patient is followed by Dr. Olga Millers.  She  had been taking digoxin, potassium chloride, Lasix, and Coreg until three  weeks ago when she discontinued these medications.   MEDICATIONS:  Tylenol p.r.n.   ALLERGIES:  None known.   SOCIAL HISTORY:  Patient is married.  She is accompanied by her husband and  mother.  She has one child.  She smokes less than a pack of cigarettes a  day.  She does not drink alcohol.  She denies illegal drug use.   FAMILY HISTORY:  Notable for coronary artery disease, per the mother.   REVIEW OF SYSTEMS:  The 15-system review without significant other  positives, except as noted above.   PHYSICAL EXAMINATION:  VITAL SIGNS:  Temp 100.9, pulse 132, respirations 20,  blood pressure 153/77.  O2  saturation 99%, room air.  GENERAL:  A 45 year old moderately obese white female, ill-appearing, on a  stretcher in the emergency department.  HEENT:  She is flushed.  Sclerae are clear.  Pupils are equal and reactive.  Dentition is good.  Mucous membranes are slightly dry.  Voice is normal.  NECK:  Palpation of the neck shows no lymphadenopathy.  No tenderness.  Thyroid normal without nodularity.  LUNGS:  Clear to auscultation bilaterally without rales, rhonchi, or wheeze.  HEART:  Tachycardia without significant murmur.  Peripheral pulses are full.  ABDOMEN:  Soft, obese, with numerous striae across the abdominal wall.  There are bowel sounds on auscultation.  There is mild tenderness in the  upper quadrants.  There is voluntary guarding.  There is no rebound  tenderness.  There is no appreciable hepatosplenomegaly.  There is a well-  healed cesarean section wound.  There is no sign of herniation.  EXTREMITIES:  Nontender without edema.  NEUROLOGIC:  The  patient is alert and oriented without evidence of focal  neurological deficit.   LABORATORY STUDIES:  White count 10.6, hemoglobin 13.1, platelet count  76,000.  Differential shows 38% neutrophils, 49% lymphocytes, 11% monocytes.  Chemistry profile shows normal electrolytes.  Creatinine normal at 0.7.  SGOT elevated at 128.  SGPT elevated at 166.  Alkaline phosphatase elevated  at 193.  Total bilirubin slightly elevated at 1.3.  Amylase is normal at 49.  Urinalysis is benign.   RADIOGRAPHIC STUDIES:  Ultrasound from [facility] as noted.  Report  placed in medical record.   IMPRESSION:  1.  Febrile illness with abnormal liver function tests, thrombocytopenia,      elevated monocytes and lymphocytes, consistent with viral syndrome.      Question mononucleosis versus hepatitis versus other viral illness.  2.  Cholelithiasis, rule out acute cholecystitis.   PLAN:  1.  Admit to [facility] to the surgical service.  2.  Empiric antibiotic therapy.  3.  Consultation with gastroenterology from [facility].  Discussed      on the telephone with Dr. Claudette Head.  4.  Repeat laboratory studies in the a.m., June 6th.  Check monospot test      this evening.  Consider hepatobiliary nuclear medicine scanning.       TMG/MEDQ  D:  05/26/2005  T:  05/26/2005  Job:  [id number]   cc:   [name]. [name], M.D. [facility]   Olga Millers, M.D. [facility] T. Russella Dar, M.D. [facility]   Velora Heckler, MD  [address] [place]  [place] [address]

## 2011-05-09 NOTE — Op Note (Signed)
[name] [name], [name] [name]               ACCOUNT NO.:  [id number]   MEDICAL RECORD NO.:  [id number]          PATIENT TYPE:  OIB   LOCATION:  2899                         FACILITY:  [facility]   PHYSICIAN:  Gabrielle Dare. [name], M.D.DATE OF BIRTH:  [birth year]   DATE OF PROCEDURE:  07/08/2005  DATE OF DISCHARGE:                                 OPERATIVE REPORT   PREOPERATIVE DIAGNOSIS:  Symptomatic cholelithiasis.   POSTOPERATIVE DIAGNOSIS:  Symptomatic cholelithiasis.   PROCEDURES:  Laparoscopic cholecystectomy with intraoperative cholangiogram.   SURGEON:  Violeta Gelinas, M.D.   ASSISTANT:  Ovidio Kin, M.D.   ANESTHESIA:  General.   HISTORY OF PRESENT ILLNESS:  The patient is a 45 year old white female who  had a recent hospitalization for acute cholecystitis with a suspected  concomitant viral infection. She had some mild elevation of her liver  function tests. She recovered from her acute illness and I evaluated her in  the office, and she now presents for elective cholecystectomy. She has not  had any further attacks in the interim.   PROCEDURE IN DETAIL:  Informed consent was obtained and the patient was  identified. She received intravenous antibiotics. She was brought to the  operating room. General anesthesia was administered. Her abdomen was prepped  and draped in sterile fashion. Due to her abdominal morphology, a  supraumbilical incision was made. Subcutaneous tissues were dissected down  revealing the anterior fascia which was divided sharply. Peritoneal cavity  was then entered under direct vision without difficulty. A 0-Vicryl  pursestring suture was placed around the fascial opening. The Hasson trocar  was inserted into the abdomen and the abdomen was insufflated with carbon  dioxide in standard fashion under direct vision. An 11 mm epigastric and two  5 mm lateral ports were placed.  Marcaine 0.25%  with epinephrine was used  at all port sites. The dome of the gallbladder  was retracted superomedially.  There were several filmy omental adhesions that were taken down off the  gallbladder and swept away. There was also some filmy adhesions between the  duodenum and the gallbladder. These were also carefully swept away keeping  away from the duodenum. At this time, the infundibulum was revealed and it  was retracted inferolaterally. Dissection began laterally and progressed  medially. Initially, there was a vascular structure running over the top of  the cystic duct. This was clipped twice proximally, once distally, and  divided. The cystic duct was then dissected out easily making a large window  between the infundibulum, the cystic duct, and the liver. The cystic artery  was also dissected at the same time with good visualization. Once an  excellent window was obtained, a clip was placed on the infundibulocystic  duct junction. A small nick was made in the cystic duct and a primary a  Reddick cholangiogram catheter was inserted. Intraoperative change  cholangiogram was then obtained. This demonstrated no common bile duct  filling defects. A good length of cystic duct and contrast flowed easily  into the duodenum. The cholangiogram catheter was removed. Three clips were  placed proximally on the cystic duct and  it was divided. Two clips were  placed proximally on the cystic artery. One clip was placed distally and it  was divided. The gallbladder was then taken off the liver bed with the Bovie  cautery. Cautery was used to get good hemostasis. The gallbladder was placed  in an EndoCatch bag and taken out of the abdomen using the supraumbilical  port site. The liver bed was rechecked and cauterized to get excellent  hemostasis. There was some minimal oozing due to the patient's fatty liver,  so a half a piece of Surgicel was left in the liver bed. The abdomen was  copiously irrigated with saline until 2 liters were used. The irrigation  returned clear. The liver  bed was rechecked and hemostasis was present. The  remainder of the irrigation fluid was evacuated and it was clear. The ports  were removed under direct vision. The pneumoperitoneum was released. The  Hasson trocar was removed. The supraumbilical fascial defect was closed by  tying the 0-Vicryl pursestring suture and the wounds were copiously  irrigated. Some additional local anesthetic was injected and the skin of  each was closed with a running 4-0 Vicryl subcuticular stitch. Sponge,  needle, and instrument counts were correct. Benzoin, Steri-Strips, and  sterile dressings were applied. The patient tolerated the procedure well  without apparent complication and was taken to the recovery room in stable  condition.       BET/MEDQ  D:  07/08/2005  T:  07/08/2005  Job:  [id number]

## 2011-05-09 NOTE — Discharge Summary (Signed)
[name], [name]               ACCOUNT NO.:  [id number]   MEDICAL RECORD NO.:  [id number]          PATIENT TYPE:  INP   LOCATION:  6705                         FACILITY:  [facility]   PHYSICIAN:  Barbette Hair. Arlyce Dice, M.D. [facility] OF BIRTH:  02-19-1966   DATE OF ADMISSION:  05/27/2005  DATE OF DISCHARGE:  05/28/2005                                 DISCHARGE SUMMARY   ADMITTING DIAGNOSES:  1.  Febrile illness of several days duration, with newly discovered      elevation of LFTs, thrombocytopenia and ultrasound findings of fatty      liver, splenomegaly and gallbladder wall thickening. Question of acute      cholecystitis.  2.  History of gestational diabetes.  3.  Gravida 2, para 1, AB 1.  4.  Status post C-section and left paratubal cyst removal March 2005.  5.  Polycystic ovary disease.  6.  Obesity.  7.  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  Ejection      fraction at that time 25-30%, underwent catheterization revealing normal      coronary arteries. Her cardiologist is Dr. Olga Millers.  8.  Question rheumatoid arthritis. The patient has had chronic joint pains      and has had increase in rheumatoid factor measurements as well as sed      rate on April 2006 lab. This problem is being followed by Dr. Posey Rea.   DISCHARGE SUMMARY:  1.  Hepatitis of uncertain etiology. She does not have other acute or      chronic hepatitis A, B or C.  2.  Thrombocytopenia and splenomegaly associated with the hepatitis.  3.  Gallstones with fatty liver, splenomegaly and gallbladder wall      thickening on ultrasound from [facility] radiology dated May 26, 2005. It is felt that the gallbladder wall thickening and      pericholecystic fluid was secondary to hypoalbuminemia and her      hepatitis. Splenomegaly also may be acute and related to hepatitis.      Fatty liver is more of a chronic problem as she is obese and has a      history of gestational diabetes. The  patient is not considered to have      had, or suffering currently from, acute cholecystitis.  4.  History of congestive heart failure with patient recently self      discontinuing her cardiac medications.  Echocardiogram during this      admission showed ejection fraction of 55-65% with mild mitral valvular      regurgitation. She was not restarted on her cardiac meds. From a      congestive heart failure standpoint, she was asymptomatic.  5.  Obesity, borderline morbid type.   BRIEF HISTORY:  [name] [name] is a 45 year old female, who about three weeks  ago stopped using her cardiac meds which consisted of digoxin, Lasix, Coreg  and potassium supplement. About two weeks ago, she started having the  following symptoms: Fevers, malaise, consistent nausea but limited emesis.  Within the last week, she developed fevers  up to 104.5. The high fevers  occurred Saturday and Sunday. On Monday, she went to [facility], where she  was able to get a quick appointment, rather than to see her primary care  physician, Dr. Posey Rea, where she finds it difficult to make appointments  convenient for her. In any event, at [facility], she had labs consisting of a  urinalysis of  which pregnancy in the urine was negative. She had nitrites  present. She had 0 to 2 white blood cells and 0 to 3  red blood cells, 3+  bacteria but also 2+ epithelial cells and yeast was noted. They thought that  maybe she was having a UTI and treated her with and treated her with  Levaquin. On that same visit, May 22, 2005, her white blood cell count  showed a normal white count at 8.4 and normal hemoglobin at 13.3. However,  her platelets were low at 83,000. Also at that time it was noted that total  bilirubin was 0.8, alkaline phosphatase of was 116, these being normal.  However, the AST was 61 and ALT was 94 which are about twice normal. The  patient failed to improve over the next few days and returned to [facility] for   reassessment on the May 26, 2005.  At that visit they ended up sending her  over to [facility] Radiology for abdominal ultrasound, concerned that  maybe she was having biliary issues because she had had the mild elevation  of her transaminases. That ultrasound did reveal splenomegaly, fatty liver  and fluid around the gallbladder with gallbladder wall thickening and  possibly a gallstone present. [facility] thought that she was having  cholecystitis, so they contacted general surgery who evaluated her when she  arrived at the [facility] Emergency Room.   The patient was admitted for further workup. She was not febrile on  presentation to the emergency room. She was empirically started on Unasyn.  However, Dr. Gerrit Friends was not impressed that this patient was suffering from  acute cholecystitis, that she had very little in the way of the pain  specific to her upper abdomen or low back pain, though she was having some  tenderness to palpation generally in the upper abdomen.   LABORATORY:  Hemoglobin initially was 13.1 and went down to 11.2. White  blood cell count high at 11.1. Platelets ranged between 76 and 77,000.  Hemoglobin was 11.2 and hematocrit was 32.9. MCV was 87.8. The differential  did reveal a lymphocytosis with 60% lymphocytes present. Coags were normal  with PT of 12.9 and INR of 1.0. Total bilirubin was 1.3. Alkaline  phosphatase went from 193 on May 26, 2005, to 164 on May 27, 2005. AST went  from 128 to 111, ALT went from 166 to 138. Albumin went from 2.8 to 2.3.  Calcium was 7.8. Amylase measured 49. Urinalysis was negative and  microscopic analysis was not required as she had no nitrites, no leukocytes,  no blood protein of glucose. Hepatitis C antibody negative, hepatitis B core  IgM antibody negative, hepatitis B surface antigen negative and hepatitis A antibody negative. Pending at this point are the liver, kidney microsomal  antibody, ANA, CMV IgM and CMV IgG  antibodies. Epstein-Barr panel pending.  Ceruloplasmin 50.  Ferritin 264.  Iron 52, total iron binding capacity 244,  and iron saturation 21%.  PT was 13.3, INR 1.0, PTT 37.   IMAGING STUDIES:  No further imaging studies were pursued.   CARDIAC STUDIES:  An echocardiogram was  obtained. This revealed the EF of 55-  65% with mild mitral valvular regurgitation.   HOSPITAL COURSE:  Over the course of about a 48-hour hospitalization, the  patient's clinical symptoms improved somewhat though they did not resolve.  She complained of some nausea and quite a bit of heartburn so she was  started on a proton pump inhibitor and the nausea improved. She was quite  hungry and diet was quickly advanced to a carbohydrate modified diet which  she tolerated. She was still anorexic by the time she left the hospital,  however.   Regarding fevers, the patient did initially have a temperature of 100.9 when  she first arrived in the emergency room, but thereafter temperatures were  below 100 so technically no further fevers.   Dr. Arlyce Dice was called in by Dr. Gerrit Friends who suspected a infectious cause to  the patient's hepatitis, especially given the thrombocytopenia and  splenomegaly. Dr. Arlyce Dice ordered multiple blood tests in order to confirm  the source for the hepatitis. At this time it is not clear what caused it  though it is strongly suspected that it was infectious and viral in nature.  She has screened negative for acute hepatitis A, B or C, but other  infectious etiologies were pending as are some autoimmune markers.   Dr. Arlyce Dice did order echocardiogram which revealed no recurrence of  congestive heart failure or reduced ejection fraction. Synthetically, the  patient's liver was functioning fairly well with normal coags, though her  albumin was somewhat decreased.   The patient was stable and was asking if she could convalesce at home and it  was felt that she could safely do so. She was  discharged in good condition  on the afternoon of May 28, 2005. She had a prescription provided for  Protonix to be taken once a day if she needed it for persistent heart burn.  She had a follow-up appointment with Dr. Arlyce Dice on July 17, 2005, at 10:00  a.m.[name] Should she require medical visit sooner than that, she was encouraged  to contact Dr. Loren Racer office rather than to return to [facility] for  this particular problem. 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 Diet-wise  it was suggested that she start watching what she eats and limit calories to  11-1499 calories and to avoid sugars and pretty much to follow a diabetic  type diet and to start moderate exercise and attempt to lose 5-10 pounds per  month.   The patient was informed that she did have fatty liver and that this was something that had been a chronic problem versus the  probable acute changes  represented by the splenomegaly and fluid around the gallbladder. She was  advised that fatty liver can progress to cirrhosis and that she already has  risk factors that put her at risk for diabetes in the near future and that  she should attempt to control her eating with a diabetic diet and to attempt  to lose weight and start exercising in order to reduce risk factors for  complicated diabetes down the road.   MEDICATIONS AT DISCHARGE:  1.  Pepcid AC twice a day or Protonix 40 mg once a day for heartburn if      needed.  2.  Advil/ibuprofen up to 600 mg daily to be used for fevers.  3.  The patient advised to avoid Tylenol for the time-being.  SG/MEDQ  D:  05/28/2005  T:  05/28/2005  Job:  [id number]   cc:   [name]. [name], M.D. [facility]   attn:  [address]  [place], [place]

## 2011-05-09 NOTE — H&P (Signed)
NAMECHERIE, [name] [name]                           ACCOUNT NO.:  [id number]   MEDICAL RECORD NO.:  [id number]                   PATIENT TYPE:   LOCATION:                                       FACILITY:   PHYSICIAN:  Juan H. Lily Peer, M.D.             DATE OF BIRTH:   DATE OF ADMISSION:  05/02/2004  DATE OF DISCHARGE:                                HISTORY & PHYSICAL   INDICATIONS FOR PROCEDURE:  The patient is scheduled for a cesarean section  on May 02, 2004 at 1:00 p.m. at [facility] [place].   CHIEF COMPLAINT:  1. Thirty-seven weeks gestation.  2. Gestational diabetic on insulin.  3. Narrow pelvis.  4. Obesity.   HISTORY OF PRESENT ILLNESS:  The patient is a 45 year old gravida 2, para 0,  AB 1 currently [redacted] weeks gestation who was diagnosed with gestational  diabetes at 39 to 28 weeks and was started on insulin therapy which had to  be adjusted periodically, whereby her last insulin dose has been as follows:  She was on 34 units of NPH subcutaneous along with 12 units of regular  insulin subcutaneous at breakfast. She was taking 5 units of regular insulin  subcutaneous at lunch time and she was taking 16 units of regular insulin at  dinner and 20 units of NPH at bedtime. She had been followed throughout her  third trimester with antepartum testing consisting of NST's and AFI's with a  reassuring fetal heart rate tracing. Her last ultrasound that was done in  the office on Apr 22, 2004 for estimated fetal weight demonstrated the fetus  to be in the 80th percentile at 35 and 1/[redacted] weeks gestation and AFI was 14.2  in the 50th percentile, vertex presentation. The patient prenatally had also  been offered amniocentesis due to the age factor being 49 at the time of  delivery and she declined, saying that she would not terminate but that she  did want to have the maternal serum alpha fetal protein despite its  limitations and she had that done along with the cystic fibrosis,  which was  normal. The patient was treated during her pregnancy for upper respiratory  tract infection early in her pregnancy, which she was treated with Cleocin  vaginal cream for bacterial vaginosis and she was diagnosed during that time  to have also positive group B strep as well. She is a smoker and counseled  on numerous occasions throughout her pregnancy and had been on progesterone  suppository, placed by a previous practitioner due to her miscarriage in the  past and her age. The patient also had been referred to nutritionist early  in her pregnancy due to her obesity and during her first trimester, she did  have a hemoglobin A1C which was normal.   PAST MEDICAL HISTORY:  She had a spontaneous AB in 2003.   ALLERGIES:  No known drug allergies.  REVIEW OF SYSTEMS:  See [employer] form.   PHYSICAL EXAMINATION:  VITAL SIGNS:  Blood pressure 140/70. Urine trace  protein, __________ glucose. Weight 287 pounds.  HEENT:  Unremarkable.  NECK:  Supple. Trachea midline. No carotid bruits. No thyromegaly.  LUNGS:  Clear to auscultation without rhonchi, wheezes.  HEART:  Regular rate and rhythm. No murmurs or gallops.  BREAST:  Examination not done.  ABDOMEN:  Gravid uterus with a fundal height of 42 cm, vertex presentation  by Thayer Ohm maneuver, recently confirmed by ultrasound.  GENITOURINARY:  Pelvic examination, narrow pelvis. Cervix long, closed and  posterior.  EXTREMITIES:  Deep tendon reflexes 1+, negative clonus.   LABORATORY DATA:  Prenatal labs, A+ blood type. Negative antibody screen.  VDRL was nonreactive. Rubella immune. Hepatitis B surface antigen and human  immunodeficiency virus were negative. Alpha fetoprotein was normal. Abnormal  diabetes screen. GBS culture was positive. Pap smear normal.   ASSESSMENT:  A 45 year old gravida 2, para 0, AB 1 at [redacted] weeks gestation, on  split regimen of insulin. Has been followed in the third trimester for  antepartum testing. Due  to the fact that she has a narrow pelvis and in  comparison to the size of this fetus, she was informed early in her  pregnancy that she would probably be a candidate for a cesarean section and  this is proven to be the fact and she was counseled as to the risks,  benefits, pros, and cons and she is scheduled for a cesarean section on May 02, 2004 at 1:00 p.m. She did undergo a amniocentesis on May 01, 2004 for  fetal lung maturity. The results came back with an LS ratio of 9.2 to 1 with  PG present. The risks, benefits, pros, and cons of cesarean section were  discussed with the patient and her husband and they would like to proceed  with this  route of delivery. All questions were answered and we will follow  accordingly.   PLAN:  The patient is scheduled for primary cesarean section on Thursday,  May 02, 2004 at 1:00 p.m. at [facility].                                               Juan H. Lily Peer, M.D.    JHF/MEDQ  D:  05/01/2004  T:  05/02/2004  Job:  [id number]

## 2011-05-09 NOTE — Op Note (Signed)
NAMEDEVANEY, SEGERS                         ACCOUNT NO.:  [id number]   MEDICAL RECORD NO.:  [id number]                   PATIENT TYPE:  INP   LOCATION:  9147                                 [facility]AC[facility]   PHYSICIAN:  Juan H. Lily Peer, M.D.             DATE OF BIRTH:  January 19, 1966   DATE OF PROCEDURE:  05/02/2004  DATE OF DISCHARGE:                                 OPERATIVE REPORT   INDICATIONS FOR PROCEDURE:  A 45 year old gravida 2, para 0, ab 1 at 37-1/2  weeks estimated gestational age. The patient is a gestational diabetic on a  split regimen of insulin, with narrow pelvimetry and significant maternal  obesity.  The patient had undergone amniocentesis for fetal lung maturity  yesterday, which was confirmed.   PREOPERATIVE DIAGNOSES:  1. Term intrauterine pregnancy at 37-1/2 weeks estimated gestational age.  2. Maternal obesity.  3. Gestational diabetes, insulin dependent.   POSTOPERATIVE DIAGNOSES:  1. Term intrauterine pregnancy at 37-1/2 weeks estimated gestational age.  2. Maternal obesity.  3. Gestational diabetes, insulin dependent.   SURGEON:  Juan H. Lily Peer, M.D.   ASSISTANTMarcial Pacas P. Fontaine, M.D.   ANESTHESIA:  Spinal.   PROCEDURE PERFORMED:  1. Primary lower uterine segment transverse cesarean section  2. Placement of suprafascial JP drain.   FINDINGS:  Viable female infant with a nuchal cord x1.  Apgars of 9 and 9;  weight 7 pounds 3 ounces.  Normal clear amniotic fluid.  She had a left  paratubal cyst and normal maternal pelvic anatomy.   DESCRIPTION OF PROCEDURE:  After the patient was adequately counseled, she  underwent a successful spinal placement.  The abdomen was prepped and draped  in the usual sterile fashion.  A Foley catheter was inserted in effort to  monitor urinary output.  A Pfannenstiel skin incision was made.  The  incision was carried down through the subcutaneous tissue, down to the  rectus fascia where a midline nick was made.   The fascia was incised in a  transverse fashion.  The peritoneal cavity was entered and the bladder flap  was established.  The lower uterine segment was incised in a transverse  fashion.  Clear amniotic fluid was present.  The newborn's head was  delivered.  The nuchal cord had to be reduced.  The nuchal cord was doubly  clamped and excised.  The nasopharyngeal area was bulb suctioned.  The  newborn gave a cry; was shown to the parents and passed off to the  pediatrician who gave the above-mentioned parameters.   After cord blood was obtained, the placenta was delivered from the  intrauterine cavity; submitted for histologic evaluation.  The intrauterine  cavity was swept clear of any remaining products of conception.  The  transverse incision was closed with a running, locking stitch of 0 Vicryl  suture.   A left paratubal cyst was noted, measuring approximately 2 cm.  This  was  transected with Bovie cautery and submitted for histologic evaluation.   The uterus was placed back into the pelvic cavity.  Then the pelvic cavity  was copiously irrigated with normal saline solution.  After ascertaining  adequate hemostasis, closure was started.  Of note, the visceroperitoneum  was not closed, but the rectus fascia was closed with a running stitch of 0  Vicryl suture.  The subcutaneous bleeders were Bovie cauterized.  A  suprafascial JP drain was placed, with it exiting off the patient's left  side approximately 3 cm from the incision; this was then closed with 3-0  Vicryl suture to keep it in place for a couple of days, to allow the area to  drain and prevent any seroma or any infection to the patient's panniculus.  The skin was then reapproximated with skin clips, followed by placement of  Xeroform gauze and 4 x 4 dressing.   DISPOSITION:  The patient was transferred to the recovery room with stable  vital signs.   ESTIMATED BLOOD LOSS:  1000 cc.   IV FLUIDS:  3600 cc lactated  Ringer's.   URINE OUTPUT:  200 cc.   She did receive 1 g Cefotan prophylactically.                                               Juan H. Lily Peer, M.D.    JHF/MEDQ  D:  05/02/2004  T:  05/03/2004  Job:  [id number]

## 2011-05-09 NOTE — Discharge Summary (Signed)
[name], [name] [name]                         ACCOUNT NO.:  [id number]   MEDICAL RECORD NO.:  [id number]                   PATIENT TYPE:  INP   LOCATION:  9147                                 FACILITY[facility]   PHYSICIAN:  Juan H. Lily Peer, M.D.             DATE OF BIRTH:  [birth year]   DATE OF ADMISSION:  05/02/2004  DATE OF DISCHARGE:                                 DISCHARGE SUMMARY   TOTAL DAYS HOSPITALIZED:  Two.   HOSPITAL COURSE:  The patient is a 45 year old gravida 2 para 0 AB 1 at [redacted]  weeks gestation who had undergone the day prior amniocentesis for fetal lung  maturity due to the fact that the patient was a gestational diabetic on  multi-regimen insulin.  She also has a very narrow pelvis and due to her  obesity a planned cesarean section was undertaken on May 02, 2004.  She  delivered a viable female infant.  There was nuchal cord x1.  The Apgar was 9  and 9.  There was normal clear amniotic fluid and the weight was 7 pounds 3  ounces.  She had a left paratubal cyst that was removed as an incidental  finding; otherwise, normal pelvic anatomy.  She did well postpartum.  Her  hemoglobin and hematocrit were 9.4 and 28.0 respectively with a platelet  count of 201,000.  Blood type was A positive, rubella negative.  Her lochia  had been decreasing, her incision site was intact, and she wanted to go home  after postpartum day #2.  She was ambulating, voiding well, and tolerating a  regular diet well.  Her random blood sugar but it was only less than 2 hours  was 137 mg/dl.  She will be discharged home to follow up next week to have  her staples removed and I have asked her to keep a log of her blood sugars  so we can review it in the office and she was given a prescription for Tylox  to take one p.o. q.4-6h. p.r.n. and she was to continue her prenatal  vitamins and iron.  GGA discharge instructions were provided.                                               Juan H. Lily Peer,  M.D.    JHF/MEDQ  D:  05/04/2004  T:  05/04/2004  Job:  [id number]

## 2011-05-09 NOTE — Cardiovascular Report (Signed)
NAME:  [name] [name], [name] [name]                         ACCOUNT NO.:  [id number]   MEDICAL RECORD NO.:  [id number]                   PATIENT TYPE:  INP   LOCATION:  4728                                 FACILITY:  [facility]   PHYSICIAN:  Charlies Constable, M.D. [facility]              DATE OF BIRTH:  December 21, 1966   DATE OF PROCEDURE:  06/17/2004  DATE OF DISCHARGE:                              CARDIAC CATHETERIZATION   PROCEDURE:  Cardiac catheterization.   CARDIOLOGIST:  Charlies Constable, M.D.   CLINICAL HISTORY:  [name] is 45 years old and is six weeks postpartum  from her first child.  She developed symptoms of shortness of breath and was  seen in [facility] and sent to the emergency room.  She was admitted by Dr.  Jens Som with a diagnosis of congestive heart failure.  An echocardiogram  showed an ejection fraction of 30-35%.  She was scheduled for evaluation  with coronary angiography.   DESCRIPTION OF PROCEDURE:  Right heart catheterization was performed  percutaneously to the right femoral vein using the venous sheath and Swan-  Ganz thermodilution catheter.  The left heart catheterization was performed  percutaneously through the right femoral artery using an arterial sheath and  6 French preformed coronary catheters.  A front wall arterial punch was  performed, and Omnipaque contrast was used.  The patient tolerated the  procedure well and left the laboratory in satisfactory condition.   RESULTS:  The left main coronary artery:  The left main coronary artery was  free of significant disease.   Left anterior descending:  The left anterior descending artery gave rise to  an optional diagonal branch, septal perforator and two more diagonal  branches.  These and the LAD proper are free of significant disease.   Circumflex artery:  The circumflex artery gave rise to an obtuse marginal  branch, small marginal branches and two posterior lateral branches.  These  vessels were free of significant  disease.   Right coronary artery:  The right coronary is a moderate size vessel that  gave rise to a conus branch, right ventricular branch, a posterior  descending branch and two posterior lateral branches.  These vessels are  free of significant disease.   LEFT VENTRICULOGRAPHY:  The left ventriculogram was performed in the RAO  projection and showed global hypokinesis with an estimated ejection fraction  of 25-30%.   HEMODYNAMIC DATA:  The right atrial pressure was 17 mean.  The right  ventricular pressure was 51/19.  Pulmonary artery pressure was 51/30 with a  mean of 41.  Pulmonary wedge pressure was 37 mean.  Left ventricular  pressure was 141/31 and the aortic pressure was 141/97 with a mean of 118.   CONCLUSION:  1. Normal coronary angiography.  2. Severe left ventricular dysfunction with elevated pulmonary artery wedge     pressure of 37.   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  We will plan more vigorous diuresis and  continue medical management.                                               Charlies Constable, M.D. [facility]    BB/MEDQ  D:  06/17/2004  T:  06/18/2004  Job:  [id number]   cc:   Olga Millers, M.D. [facility]   Cardiopulmonary Lab

## 2011-05-09 NOTE — H&P (Signed)
NAMEEBELIN, DILLEHAY                         ACCOUNT NO.:  [id number]   MEDICAL RECORD NO.:  [id number]                   PATIENT TYPE:  INP   LOCATION:  4728                                 FACILITY:  [facility]   PHYSICIAN:  Olga Millers, M.D. [facility]            DATE OF BIRTH:  06/04/1966   DATE OF ADMISSION:  06/15/2004  DATE OF DISCHARGE:                                HISTORY & PHYSICAL   HISTORY:  Ms. Mcfadden is a pleasant 45 year old female who has a past medical  history of gestational diabetes mellitus, who presents with new-onset  congestive heart failure symptoms.  The patient delivered her first child on  May 02, 2004.  This was by C-section and was uncomplicated.  Over the past  three weeks, she has noticed progressive dyspnea on exertion and over the  past week she has had orthopnea, PND and pedal edema.  She also describes a  chest tightness that increases with inspiration.  There have been no  palpitations, presyncope or syncope.  She denies any hemoptysis, fevers or  chills, or productive cough.  She was seen in [facility] today and a chest x-  ray apparently revealed cardiac enlargement and mild edema.  She was sent  for a CT scan which showed bilateral pleural effusions and probable edema,  as well as a pericardial effusion.  We were, therefore, asked to further  evaluate.   ALLERGIES:  She has no known drug allergies.   MEDICATIONS:  Her medications include Zoloft 50 mg two q.d.   PAST MEDICAL HISTORY:  Significant for gestational diabetes mellitus, but  she denies any diabetes mellitus or hyperlipidemia.  There is no other  medical history.  She recently had a C-section, but has no other surgical  history.   FAMILY HISTORY:  Positive for coronary artery disease in her father.   SOCIAL HISTORY:  She does not smoke, nor does she consume alcohol.   REVIEW OF SYSTEMS:  She denies any headaches, fevers, or chills.  There is  no productive cough or hemoptysis.  There  is no dysphagia, odynophagia,  melena, hematochezia.  There is no dysuria or hematuria.  There is no rash  or seizure activity.  There is orthopnea and PND, as well as pedal edema.  Remaining Review of Systems negative.   PHYSICAL EXAMINATION:  VITAL SIGNS:  Physical examination today shows a  blood pressure of 143/97, pulse 105.  She is afebrile.  GENERAL:  She is well-developed and somewhat obese.  She is in no acute  distress.  SKIN:  Warm and dry.  HEENT:  Unremarkable with normal eyelids.  NECK:  Supple with normal upstroke bilaterally, no bruits noted.  There is  no thyromegaly noted.  I cannot assess jugular venous distention.  CHEST: Clear to auscultation.  Normal expansion.  CARDIOVASCULAR:  There is a tachycardiac rate, but regular rhythm.  There is  an S3.  There are  no murmurs noted.  ABDOMEN:  Nontender, nondistended.  Positive bowel sounds.  No  hepatosplenomegaly and no masses appreciated.  There is no abdominal bruit.  PULSES:  She has 2+ femoral pulses bilaterally and no bruits.  EXTREMITIES:  Show trace to 1+ edema bilaterally.  She has 2+ dorsalis pedis  pulses bilaterally.  NEUROLOGIC:  Grossly intact.  Of note, she is somewhat anxious today.   LABORATORY DATA:  Her laboratories are pending at the time of this  dictation.  Her chest CT is as described in the HPI, and shows moderate  bilateral pleural effusions and a moderate pericardial effusion.  She also  has probably CHF.  A quick-look echocardiogram in the emergency room  revealed moderately severe global reduction of LV function with an ejection  fraction of approximately 30%.  There was a moderate pericardial effusion  with RA collapse, but there is no RV diastolic collapse.  The IVC collapsed  with inspiration.  Of note, a chest x-ray shows cardiac enlargement.   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   PLAN:  Ms. Vitug presents with new-onset congestive heart failure symptoms,  including  dyspnea on exertion, orthopnea, and PND, as well as pedal edema.  Her echo shows moderately severe global reduction in LV function, and she  has evidence of bilateral pleural effusions, pericardial effusion as well as  probable edema.  I think the etiology of her decreased LV function is most  likely a peripartum cardiomyopathy.  We will plan to admit and cycle  enzymes.  We will treat with Lasix, digoxin, as well ACE inhibition.  We  will add a beta blocker when her congestive heart failure improves,  beginning with 3.2/5 mg b.i.d. of Coreg.  I will also treat her with  aspirin, as well as deep venous thrombosis prophylaxis with Lovenox.  We  will need to follow her renal function closely.  We will plan to repeat her  echocardiogram on Monday to follow her pericardial effusion.  I have also  recommended a right and left heart catheterization.  Of note, the patient is  not breast-feeding when considering her medications.  Also, of note, she was  very tearful in the emergency room and was concerned about being away from  her newborn.  We have made arrangements for her to be in a private room and  her child will be able to stay with her.  We will also check a TSH.                                                Olga Millers, M.D. [facility]    BC/MEDQ  D:  06/15/2004  T:  06/16/2004  Job:  [id number]

## 2011-05-20 ENCOUNTER — Encounter: Payer: Self-pay | Admitting: Internal Medicine

## 2011-05-21 ENCOUNTER — Encounter: Payer: Self-pay | Admitting: Internal Medicine

## 2011-05-22 ENCOUNTER — Other Ambulatory Visit (INDEPENDENT_AMBULATORY_CARE_PROVIDER_SITE_OTHER): Payer: BC Managed Care – PPO

## 2011-05-22 ENCOUNTER — Encounter: Payer: Self-pay | Admitting: Internal Medicine

## 2011-05-22 ENCOUNTER — Ambulatory Visit (INDEPENDENT_AMBULATORY_CARE_PROVIDER_SITE_OTHER): Payer: BC Managed Care – PPO | Admitting: Internal Medicine

## 2011-05-22 ENCOUNTER — Telehealth: Payer: Self-pay | Admitting: Internal Medicine

## 2011-05-22 DIAGNOSIS — E119 Type 2 diabetes mellitus without complications: Secondary | ICD-10-CM

## 2011-05-22 DIAGNOSIS — R945 Abnormal results of liver function studies: Secondary | ICD-10-CM

## 2011-05-22 DIAGNOSIS — F329 Major depressive disorder, single episode, unspecified: Secondary | ICD-10-CM

## 2011-05-22 LAB — HEMOGLOBIN A1C: Hgb A1c MFr Bld: 8.8 % — ABNORMAL HIGH (ref 4.6–6.5)

## 2011-05-22 LAB — COMPREHENSIVE METABOLIC PANEL
AST: 80 U/L — ABNORMAL HIGH (ref 0–37)
Albumin: 3.6 g/dL (ref 3.5–5.2)
Alkaline Phosphatase: 93 U/L (ref 39–117)
BUN: 7 mg/dL (ref 6–23)
Potassium: 4.4 mEq/L (ref 3.5–5.1)

## 2011-05-22 NOTE — Progress Notes (Signed)
  Subjective:    Patient ID: [name] [name], female    DOB: August 01, 1966, 45 y.o.   MRN: [id number]  HPI The patient presents for a follow-up of  chronic hypertension, chronic dyslipidemia, type 2 diabetes controlled with medicines  CBGs were high    Review of Systems  Constitutional: Negative for diaphoresis, appetite change and unexpected weight change.  HENT: Negative for congestion.   Respiratory: Negative for cough.   Gastrointestinal: Negative for diarrhea and blood in stool.  Musculoskeletal: Negative for back pain.  Skin: Negative for pallor.  Neurological: Positive for weakness. Negative for tremors.  Psychiatric/Behavioral: Negative for confusion.   Wt Readings from Last 3 Encounters:  05/22/11 255 lb (115.667 kg)  01/21/11 259 lb (117.482 kg)  12/24/09 255 lb (115.667 kg)       Objective:   Physical Exam  Constitutional: She appears well-developed and well-nourished. No distress.       obese  HENT:  Head: Normocephalic.  Right Ear: External ear normal.  Left Ear: External ear normal.  Nose: Nose normal.  Mouth/Throat: Oropharynx is clear and moist.  Eyes: Conjunctivae are normal. Pupils are equal, round, and reactive to light. Right eye exhibits no discharge. Left eye exhibits no discharge.  Neck: Normal range of motion. Neck supple. No JVD present. No tracheal deviation present. No thyromegaly present.  Cardiovascular: Normal rate, regular rhythm and normal heart sounds.   Pulmonary/Chest: No stridor. No respiratory distress. She has no wheezes.  Abdominal: Soft. Bowel sounds are normal. She exhibits no distension and no mass. There is no tenderness. There is no rebound and no guarding.  Musculoskeletal: She exhibits no edema and no tenderness.  Lymphadenopathy:    She has no cervical adenopathy.  Neurological: She displays normal reflexes. No cranial nerve deficit. She exhibits normal muscle tone. Coordination normal.  Skin: No rash noted. No erythema.    Psychiatric: She has a normal mood and affect. Her behavior is normal. Judgment and thought content normal.          Assessment & Plan:

## 2011-05-22 NOTE — Assessment & Plan Note (Signed)
On Rx Check labs 

## 2011-05-22 NOTE — Telephone Encounter (Signed)
[name] [name], please, inform patient that all labs are normal except for high CBG, liver tests and A1c Pls read on medicine called Victoza (pick up a kit) - it should help with sugars and to loose wt. Let me know if willing to add/start.

## 2011-05-22 NOTE — Assessment & Plan Note (Signed)
On Celexa 

## 2011-05-22 NOTE — Assessment & Plan Note (Signed)
Will recheck

## 2011-05-23 MED ORDER — LIRAGLUTIDE 18 MG/3ML ~~LOC~~ SOLN: 1.8000 mg | SUBCUTANEOUS | Status: DC

## 2011-05-23 NOTE — Telephone Encounter (Signed)
Pt informed and she is willing to start Victoza. Please send in new Rx

## 2011-05-23 NOTE — Telephone Encounter (Signed)
Good. Pls give her sample/kit/needles to start. She may have a little nausea with it at first. Thx

## 2011-05-26 ENCOUNTER — Other Ambulatory Visit: Payer: Self-pay | Admitting: *Deleted

## 2011-05-26 MED ORDER — LIRAGLUTIDE 18 MG/3ML ~~LOC~~ SOLN: 1.8000 mg | SUBCUTANEOUS | Status: DC

## 2011-05-26 NOTE — Telephone Encounter (Signed)
Left detailed mess informing pt sample is in fridge with her name on it and that I sent new rx to her pharmacy.

## 2011-05-28 ENCOUNTER — Telehealth: Payer: Self-pay | Admitting: *Deleted

## 2011-05-28 NOTE — Telephone Encounter (Signed)
I called pt's pharm plan at [phone] and was transferred.....[name] I obtained approval for PA on Victoza effective 05-28-11 until 05-27-12. I informed pharmacy of this and left a detailed mess informing pt of this. I also advised her that Victoza samples are in fridge waiting to be picked up.

## 2011-07-29 ENCOUNTER — Other Ambulatory Visit: Payer: Self-pay | Admitting: Internal Medicine

## 2011-08-11 ENCOUNTER — Other Ambulatory Visit: Payer: Self-pay | Admitting: *Deleted

## 2011-08-11 MED ORDER — LIRAGLUTIDE 18 MG/3ML ~~LOC~~ SOLN: 1.8000 mg | SUBCUTANEOUS | Status: DC

## 2011-08-11 MED ORDER — INSULIN PEN NEEDLE 32G X 4 MM MISC: 1.0000 | Status: DC

## 2011-09-11 ENCOUNTER — Other Ambulatory Visit: Payer: Self-pay | Admitting: Internal Medicine

## 2011-09-18 LAB — CBC
HCT: 30.5 — ABNORMAL LOW
Hemoglobin: 10.4 — ABNORMAL LOW
RBC: 3.51 — ABNORMAL LOW
RDW: 15.1
WBC: 12.5 — ABNORMAL HIGH

## 2011-09-23 ENCOUNTER — Other Ambulatory Visit (INDEPENDENT_AMBULATORY_CARE_PROVIDER_SITE_OTHER): Payer: BC Managed Care – PPO

## 2011-09-23 ENCOUNTER — Encounter: Payer: Self-pay | Admitting: Internal Medicine

## 2011-09-23 ENCOUNTER — Ambulatory Visit (INDEPENDENT_AMBULATORY_CARE_PROVIDER_SITE_OTHER): Payer: BC Managed Care – PPO | Admitting: Internal Medicine

## 2011-09-23 VITALS — BP 140/84 | HR 86 | Temp 98.2°F | Resp 16 | Wt 247.0 lb

## 2011-09-23 DIAGNOSIS — R000 Tachycardia, unspecified: Secondary | ICD-10-CM

## 2011-09-23 DIAGNOSIS — F329 Major depressive disorder, single episode, unspecified: Secondary | ICD-10-CM

## 2011-09-23 DIAGNOSIS — G43909 Migraine, unspecified, not intractable, without status migrainosus: Secondary | ICD-10-CM

## 2011-09-23 DIAGNOSIS — E119 Type 2 diabetes mellitus without complications: Secondary | ICD-10-CM

## 2011-09-23 DIAGNOSIS — F411 Generalized anxiety disorder: Secondary | ICD-10-CM

## 2011-09-23 LAB — COMPREHENSIVE METABOLIC PANEL
AST: 91 U/L — ABNORMAL HIGH (ref 0–37)
Albumin: 3.9 g/dL (ref 3.5–5.2)
BUN: 9 mg/dL (ref 6–23)
CO2: 26 mEq/L (ref 19–32)
Calcium: 8.5 mg/dL (ref 8.4–10.5)
Chloride: 102 mEq/L (ref 96–112)
Creatinine, Ser: 0.6 mg/dL (ref 0.4–1.2)
GFR: 120.73 mL/min (ref >60.00)
Potassium: 3.7 mEq/L (ref 3.5–5.1)

## 2011-09-23 MED ORDER — NIFEDIPINE ER OSMOTIC RELEASE 90 MG PO TB24: 90.0000 mg | ORAL_TABLET | Freq: Every day | ORAL | Status: DC

## 2011-09-23 MED ORDER — OMEPRAZOLE 40 MG PO CPDR: 40.0000 mg | DELAYED_RELEASE_CAPSULE | Freq: Every day | ORAL | Status: DC

## 2011-09-23 MED ORDER — CITALOPRAM HYDROBROMIDE 20 MG PO TABS: 20.0000 mg | ORAL_TABLET | Freq: Every day | ORAL | Status: DC

## 2011-09-23 MED ORDER — METFORMIN HCL 500 MG PO TABS: 500.0000 mg | ORAL_TABLET | Freq: Two times a day (BID) | ORAL | Status: DC

## 2011-09-23 MED ORDER — BUPROPION HCL ER (SR) 150 MG PO TB12: 150.0000 mg | ORAL_TABLET | Freq: Two times a day (BID) | ORAL | Status: DC

## 2011-09-23 MED ORDER — HYDROCODONE-ACETAMINOPHEN 7.5-325 MG PO TABS: 1.0000 | ORAL_TABLET | Freq: Four times a day (QID) | ORAL | Status: AC | PRN

## 2011-09-23 NOTE — Progress Notes (Signed)
  Subjective:    Patient ID: [name] [name], female    DOB: 1966-12-22, 45 y.o.   MRN: [id number]  HPI  The patient presents for a follow-up of  chronic hypertension, chronic dyslipidemia, type 2 diabetes controlled with medicines C/o bad HAs at times   Review of Systems  Constitutional: Negative for chills, activity change, appetite change, fatigue and unexpected weight change.  HENT: Negative for congestion, mouth sores and sinus pressure.   Eyes: Negative for visual disturbance.  Respiratory: Negative for cough and chest tightness.   Gastrointestinal: Negative for nausea and abdominal pain.  Genitourinary: Negative for frequency, difficulty urinating and vaginal pain.  Musculoskeletal: Negative for back pain and gait problem.  Skin: Negative for pallor and rash.  Neurological: Negative for dizziness, tremors, weakness, numbness and headaches.  Psychiatric/Behavioral: Negative for confusion and sleep disturbance.       Objective:   Physical Exam  Constitutional: She appears well-developed. No distress.       obese  HENT:  Head: Normocephalic.  Right Ear: External ear normal.  Left Ear: External ear normal.  Nose: Nose normal.  Mouth/Throat: Oropharynx is clear and moist.  Eyes: Conjunctivae are normal. Pupils are equal, round, and reactive to light. Right eye exhibits no discharge. Left eye exhibits no discharge.  Neck: Normal range of motion. Neck supple. No JVD present. No tracheal deviation present. No thyromegaly present.  Cardiovascular: Normal rate, regular rhythm and normal heart sounds.   Pulmonary/Chest: No stridor. No respiratory distress. She has no wheezes.  Abdominal: Soft. Bowel sounds are normal. She exhibits no distension and no mass. There is no tenderness. There is no rebound and no guarding.  Musculoskeletal: She exhibits no edema and no tenderness.  Lymphadenopathy:    She has no cervical adenopathy.  Neurological: She displays normal reflexes. No  cranial nerve deficit. She exhibits normal muscle tone. Coordination normal.  Skin: No rash noted. No erythema.  Psychiatric: She has a normal mood and affect. Her behavior is normal. Judgment and thought content normal.   Lab Results  Component Value Date   WBC 10.1 12/24/2009   HGB 13.3 12/24/2009   HCT 38.3 12/24/2009   PLT 214.0 12/24/2009   GLUCOSE 201* 09/23/2011   CHOL 207* 07/09/2009   TRIG 546.0* 07/09/2009   HDL 31.10* 07/09/2009   LDLDIRECT 94.0 07/09/2009   ALT 68* 09/23/2011   AST 91* 09/23/2011   NA 136 09/23/2011   K 3.7 09/23/2011   CL 102 09/23/2011   CREATININE 0.6 09/23/2011   BUN 9 09/23/2011   CO2 26 09/23/2011   TSH 2.07 05/22/2011   HGBA1C 7.5* 09/23/2011          Assessment & Plan:

## 2011-09-23 NOTE — Patient Instructions (Signed)
Wt Readings from Last 3 Encounters:  09/23/11 247 lb (112.038 kg)  05/22/11 255 lb (115.667 kg)  01/21/11 259 lb (117.482 kg)

## 2011-09-23 NOTE — Assessment & Plan Note (Signed)
Vicodin prn w/caution 

## 2011-09-23 NOTE — Assessment & Plan Note (Signed)
Continue/change  current prescription therapy as reflected on the Med list. Lost wt

## 2011-09-27 NOTE — Assessment & Plan Note (Signed)
Continue with current prescription therapy as reflected on the Med list.  

## 2011-09-27 NOTE — Assessment & Plan Note (Signed)
HR at home is <90

## 2011-09-30 LAB — BASIC METABOLIC PANEL
BUN: 6
CO2: 21
Calcium: 9.7
Chloride: 104
Creatinine, Ser: 0.55

## 2011-09-30 LAB — CBC
HCT: 30.5 — ABNORMAL LOW
MCHC: 34.6
MCV: 89.2
MCV: 89.5
Platelets: 158
Platelets: 195
RBC: 4.13
RDW: 15
WBC: 11.8 — ABNORMAL HIGH

## 2011-09-30 LAB — TYPE AND SCREEN: ABO/RH(D): A POS

## 2011-09-30 LAB — RPR: RPR Ser Ql: NONREACTIVE

## 2011-10-02 ENCOUNTER — Ambulatory Visit (INDEPENDENT_AMBULATORY_CARE_PROVIDER_SITE_OTHER): Payer: BC Managed Care – PPO | Admitting: Internal Medicine

## 2011-10-02 ENCOUNTER — Encounter: Payer: Self-pay | Admitting: Internal Medicine

## 2011-10-02 VITALS — BP 150/80 | HR 108 | Temp 98.9°F | Resp 16 | Wt 240.0 lb

## 2011-10-02 DIAGNOSIS — J209 Acute bronchitis, unspecified: Secondary | ICD-10-CM

## 2011-10-02 MED ORDER — PROMETHAZINE-CODEINE 6.25-10 MG/5ML PO SYRP: 5.0000 mL | ORAL_SOLUTION | ORAL | Status: AC | PRN

## 2011-10-02 MED ORDER — AZITHROMYCIN 250 MG PO TABS: ORAL_TABLET | ORAL | Status: AC

## 2011-10-02 NOTE — Patient Instructions (Signed)
Use over-the-counter  "cold" medicines  such as   "Mucinex"  for cough and congestion. Avoid decongestants if you have high blood pressure and use "Afrin" nasal spray for nasal congestion as directed instead. Use" Delsym" or" Robitussin" cough syrup varietis for cough.  You can use plain "Tylenol" or "Advi"l for fever, chills and achyness.

## 2011-10-02 NOTE — Assessment & Plan Note (Signed)
See Meds 

## 2011-10-02 NOTE — Progress Notes (Signed)
  Subjective:    Patient ID: [name] [name], female    DOB: [birth year], 45 y.o.   MRN: [id number]  HPI    HPI  C/o URI sx's x  5 days. C/o ST, cough, weakness. Not better with OTC medicines. Actually, the patient is getting worse. The patient did not sleep last night due to cough.  Review of Systems  Constitutional: Positive for fever, chills and fatigue.  HENT: Positive for congestion, rhinorrhea, sneezing and postnasal drip.   Eyes: Positive for photophobia and pain. Negative for discharge and visual disturbance.  Respiratory: Positive for cough and wheezing.   Positive for chest pain.  Gastrointestinal: Negative for vomiting, abdominal pain, diarrhea and abdominal distention.  Genitourinary: Negative for dysuria and difficulty urinating.  Skin: Negative for rash.  Neurological: Positive for dizziness, weakness and light-headedness.     Review of Systems     Objective:   Physical Exam  Constitutional: She appears well-developed. No distress.       Obese Hoarse NAD  HENT:  Head: Normocephalic.  Right Ear: External ear normal.  Left Ear: External ear normal.  Nose: Nose normal.  Mouth/Throat: Oropharynx is clear and moist.  Eyes: Conjunctivae are normal. Pupils are equal, round, and reactive to light. Right eye exhibits no discharge. Left eye exhibits no discharge.       eryth throat  Neck: Normal range of motion. Neck supple. No JVD present. No tracheal deviation present. No thyromegaly present.  Cardiovascular: Normal rate, regular rhythm and normal heart sounds.   Pulmonary/Chest: No stridor. No respiratory distress. She has no wheezes.  Abdominal: Soft. Bowel sounds are normal. She exhibits no distension and no mass. There is no tenderness. There is no rebound and no guarding.  Musculoskeletal: She exhibits no edema and no tenderness.  Lymphadenopathy:    She has no cervical adenopathy.  Neurological: She displays normal reflexes. No cranial nerve deficit. She  exhibits normal muscle tone. Coordination normal.  Skin: No rash noted. No erythema.  Psychiatric: She has a normal mood and affect. Her behavior is normal. Judgment and thought content normal.          Assessment & Plan:

## 2011-11-26 ENCOUNTER — Telehealth: Payer: Self-pay | Admitting: *Deleted

## 2011-11-26 ENCOUNTER — Other Ambulatory Visit: Payer: Self-pay | Admitting: Internal Medicine

## 2011-11-26 NOTE — Telephone Encounter (Signed)
Requested Medications     cyclobenzaprine (FLEXERIL) 5 MG tablet [Pharmacy Med Name: CYCLOBENZAPRINE 5 MG TABLET]   TAKE 1 TABLET AT BEDTIME   Disp: 30 tablet R: 5 Start: 11/26/2011  Class: Normal   Originally ordered on: 04/18/2011  Last refill: 10/30/2011

## 2011-11-27 NOTE — Telephone Encounter (Signed)
OK to fill this prescription with additional refills x5 Thank you!  

## 2011-11-28 ENCOUNTER — Other Ambulatory Visit: Payer: Self-pay

## 2011-11-28 MED ORDER — CYCLOBENZAPRINE HCL 5 MG PO TABS: ORAL_TABLET | ORAL | Status: DC

## 2011-11-28 NOTE — Telephone Encounter (Signed)
Filled prescription as requested by MD

## 2011-12-17 ENCOUNTER — Other Ambulatory Visit: Payer: Self-pay | Admitting: Internal Medicine

## 2012-02-10 ENCOUNTER — Other Ambulatory Visit: Payer: Self-pay | Admitting: *Deleted

## 2012-02-10 MED ORDER — LIRAGLUTIDE 18 MG/3ML ~~LOC~~ SOLN: 1.8000 mg | Freq: Every day | SUBCUTANEOUS | Status: DC

## 2012-03-04 ENCOUNTER — Other Ambulatory Visit: Payer: Self-pay | Admitting: Obstetrics and Gynecology

## 2012-03-04 DIAGNOSIS — Z1231 Encounter for screening mammogram for malignant neoplasm of breast: Secondary | ICD-10-CM

## 2012-03-09 ENCOUNTER — Other Ambulatory Visit: Payer: Self-pay | Admitting: *Deleted

## 2012-03-09 MED ORDER — METFORMIN HCL 500 MG PO TABS: 500.0000 mg | ORAL_TABLET | Freq: Two times a day (BID) | ORAL | Status: DC

## 2012-03-09 MED ORDER — BUPROPION HCL ER (SR) 150 MG PO TB12: 150.0000 mg | ORAL_TABLET | Freq: Two times a day (BID) | ORAL | Status: DC

## 2012-03-09 MED ORDER — INSULIN PEN NEEDLE 32G X 4 MM MISC: 1.0000 | Status: DC

## 2012-03-09 MED ORDER — CITALOPRAM HYDROBROMIDE 20 MG PO TABS: 20.0000 mg | ORAL_TABLET | Freq: Every day | ORAL | Status: DC

## 2012-03-11 ENCOUNTER — Other Ambulatory Visit: Payer: Self-pay | Admitting: *Deleted

## 2012-03-11 MED ORDER — NIFEDIPINE ER OSMOTIC RELEASE 90 MG PO TB24: 90.0000 mg | ORAL_TABLET | Freq: Every day | ORAL | Status: DC

## 2012-04-01 ENCOUNTER — Telehealth: Payer: Self-pay | Admitting: *Deleted

## 2012-04-01 DIAGNOSIS — Z0000 Encounter for general adult medical examination without abnormal findings: Secondary | ICD-10-CM

## 2012-04-01 NOTE — Telephone Encounter (Signed)
June CPE labs entered.  

## 2012-04-04 ENCOUNTER — Other Ambulatory Visit: Payer: Self-pay | Admitting: Internal Medicine

## 2012-04-06 ENCOUNTER — Ambulatory Visit
Admission: RE | Admit: 2012-04-06 | Discharge: 2012-04-06 | Disposition: A | Discharge status: Home / Self Care | Payer: BC Managed Care – PPO | Source: Ambulatory Visit | Attending: Obstetrics and Gynecology | Admitting: Obstetrics and Gynecology

## 2012-04-06 DIAGNOSIS — Z1231 Encounter for screening mammogram for malignant neoplasm of breast: Secondary | ICD-10-CM

## 2012-05-25 ENCOUNTER — Other Ambulatory Visit (INDEPENDENT_AMBULATORY_CARE_PROVIDER_SITE_OTHER): Payer: BC Managed Care – PPO

## 2012-05-25 ENCOUNTER — Telehealth: Payer: Self-pay

## 2012-05-25 ENCOUNTER — Other Ambulatory Visit: Payer: Self-pay | Admitting: Internal Medicine

## 2012-05-25 DIAGNOSIS — Z0000 Encounter for general adult medical examination without abnormal findings: Secondary | ICD-10-CM

## 2012-05-25 LAB — LDL CHOLESTEROL, DIRECT: Direct LDL: 87.1 mg/dL

## 2012-05-25 LAB — CBC WITH DIFFERENTIAL/PLATELET
Basophils Absolute: 0.1 10*3/uL (ref 0.0–0.1)
Eosinophils Absolute: 0.2 10*3/uL (ref 0.0–0.7)
Lymphocytes Relative: 18.2 % (ref 12.0–46.0)
MCHC: 30 g/dL (ref 30.0–36.0)
Neutrophils Relative %: 74.2 % (ref 43.0–77.0)
Platelets: 231 10*3/uL (ref 150.0–400.0)
RDW: 19.9 % — ABNORMAL HIGH (ref 11.5–14.6)

## 2012-05-25 LAB — URINALYSIS, ROUTINE W REFLEX MICROSCOPIC
Hgb urine dipstick: NEGATIVE
Nitrite: NEGATIVE
Specific Gravity, Urine: 1.01 (ref 1.000–1.030)
Urine Glucose: NEGATIVE
Urobilinogen, UA: 0.2 (ref 0.0–1.0)

## 2012-05-25 LAB — BASIC METABOLIC PANEL
BUN: 7 mg/dL (ref 6–23)
CO2: 25 mEq/L (ref 19–32)
Calcium: 8.7 mg/dL (ref 8.4–10.5)
Creatinine, Ser: 0.4 mg/dL (ref 0.4–1.2)

## 2012-05-25 LAB — LIPID PANEL
Cholesterol: 157 mg/dL (ref 0–200)
Total CHOL/HDL Ratio: 5
Triglycerides: 262 mg/dL — ABNORMAL HIGH (ref 0.0–149.0)
VLDL: 52.4 mg/dL — ABNORMAL HIGH (ref 0.0–40.0)

## 2012-05-25 LAB — HEPATIC FUNCTION PANEL
ALT: 36 U/L — ABNORMAL HIGH (ref 0–35)
Bilirubin, Direct: 0.1 mg/dL (ref 0.0–0.3)
Total Bilirubin: 1 mg/dL (ref 0.3–1.2)
Total Protein: 7 g/dL (ref 6.0–8.3)

## 2012-05-25 LAB — TSH: TSH: 2.27 u[IU]/mL (ref 0.35–5.50)

## 2012-05-25 NOTE — Telephone Encounter (Signed)
Pt advised.

## 2012-05-25 NOTE — Telephone Encounter (Signed)
Keep OV tomorrow Thx

## 2012-05-25 NOTE — Telephone Encounter (Signed)
Per AVP, pt contacted and c/o only fatigue with mild cramping in feet. She also says that she had appt with GYN on 05/21 and was told at that visit that her Hemoglobin was low at 9.1. Pt was advised to start a multivitamin with iron which she has been taking daily since.   Prior hemoglobin 13.3 and hematocrit 38.3 on 01/21/2010

## 2012-05-25 NOTE — Telephone Encounter (Signed)
Critical Lab - Hemoglobin 7.9 Hematocrit 26.8

## 2012-05-26 ENCOUNTER — Other Ambulatory Visit (INDEPENDENT_AMBULATORY_CARE_PROVIDER_SITE_OTHER): Payer: BC Managed Care – PPO

## 2012-05-26 ENCOUNTER — Encounter: Payer: Self-pay | Admitting: Internal Medicine

## 2012-05-26 ENCOUNTER — Ambulatory Visit (INDEPENDENT_AMBULATORY_CARE_PROVIDER_SITE_OTHER): Payer: BC Managed Care – PPO | Admitting: Internal Medicine

## 2012-05-26 VITALS — BP 122/70 | HR 108 | Temp 98.9°F | Resp 16 | Ht 68.0 in | Wt 238.0 lb

## 2012-05-26 DIAGNOSIS — D509 Iron deficiency anemia, unspecified: Secondary | ICD-10-CM

## 2012-05-26 DIAGNOSIS — Z0000 Encounter for general adult medical examination without abnormal findings: Secondary | ICD-10-CM

## 2012-05-26 DIAGNOSIS — Z111 Encounter for screening for respiratory tuberculosis: Secondary | ICD-10-CM

## 2012-05-26 DIAGNOSIS — E119 Type 2 diabetes mellitus without complications: Secondary | ICD-10-CM

## 2012-05-26 LAB — HEMOGLOBIN: Hemoglobin: 8 g/dL — CL (ref 12.0–15.0)

## 2012-05-26 LAB — CBC WITH DIFFERENTIAL/PLATELET
Basophils Absolute: 0.1 10*3/uL (ref 0.0–0.1)
HCT: 27 % — ABNORMAL LOW (ref 36.0–46.0)
Hemoglobin: 8 g/dL — CL (ref 12.0–15.0)
Lymphs Abs: 2.3 10*3/uL (ref 0.7–4.0)
MCV: 68.5 fl — ABNORMAL LOW (ref 78.0–100.0)
Monocytes Relative: 4.7 % (ref 3.0–12.0)
Neutro Abs: 5.8 10*3/uL (ref 1.4–7.7)
RDW: 20.5 % — ABNORMAL HIGH (ref 11.5–14.6)

## 2012-05-26 LAB — HEMOGLOBIN A1C: Hgb A1c MFr Bld: 6.7 % — ABNORMAL HIGH (ref 4.6–6.5)

## 2012-05-26 LAB — VITAMIN B12: Vitamin B-12: 585 pg/mL (ref 211–911)

## 2012-05-26 MED ORDER — ALPRAZOLAM 0.5 MG PO TABS: 0.5000 mg | ORAL_TABLET | Freq: Two times a day (BID) | ORAL | Status: DC | PRN

## 2012-05-26 MED ORDER — HYDROCODONE-ACETAMINOPHEN 5-325 MG PO TABS: 1.0000 | ORAL_TABLET | Freq: Two times a day (BID) | ORAL | Status: DC | PRN

## 2012-05-26 MED ORDER — IRON POLYSACCH CMPLX-B12-FA 150-0.025-1 MG PO CAPS: ORAL_CAPSULE | ORAL | Status: DC

## 2012-05-26 NOTE — Assessment & Plan Note (Signed)
6/13 severe - check additional labs Declined transfusion Start Iron

## 2012-05-26 NOTE — Assessment & Plan Note (Signed)
Check A1c. 

## 2012-05-26 NOTE — Progress Notes (Signed)
Subjective:    Patient ID: [name] [name], female    DOB: Sep 22, 1966, 46 y.o.   MRN: [id number]  HPI  The patient is here for a wellness exam. The patient has been doing well overall without major physical or psychological issues going on lately.  C/o fatigue and SOB x 1 mo  The patient needs to address  chronic hypertension that has been well controlled with medicines; to address h/o CHF controlled with medicines as well; and to address type 2 chronic diabetes, controlled with medical treatment and diet.  BP Readings from Last 3 Encounters:  05/26/12 122/70  10/02/11 150/80  09/23/11 140/84   Wt Readings from Last 3 Encounters:  05/26/12 238 lb (107.956 kg)  10/02/11 240 lb (108.863 kg)  09/23/11 247 lb (112.038 kg)      Review of Systems  Constitutional: Positive for fatigue. Negative for fever, chills, diaphoresis, activity change, appetite change and unexpected weight change.  HENT: Negative for hearing loss, ear pain, congestion, sore throat, sneezing, mouth sores, neck pain, dental problem, voice change, postnasal drip and sinus pressure.   Eyes: Negative for pain and visual disturbance.  Respiratory: Negative for cough, chest tightness, wheezing and stridor.   Cardiovascular: Negative for chest pain, palpitations and leg swelling.  Gastrointestinal: Negative for nausea, vomiting, abdominal pain, blood in stool, abdominal distention and rectal pain.  Genitourinary: Positive for vaginal bleeding and menstrual problem. Negative for dysuria, hematuria, decreased urine volume, vaginal discharge, difficulty urinating and vaginal pain.  Musculoskeletal: Negative for back pain, joint swelling and gait problem.  Skin: Negative for color change, rash and wound.  Neurological: Negative for dizziness, tremors, syncope, speech difficulty and light-headedness.  Hematological: Negative for adenopathy.  Psychiatric/Behavioral: Negative for suicidal ideas, hallucinations, behavioral  problems, confusion, sleep disturbance, dysphoric mood and decreased concentration. The patient is not hyperactive.        Objective:   Physical Exam  Constitutional: She appears well-developed. No distress.       Obese   HENT:  Head: Normocephalic.  Right Ear: External ear normal.  Left Ear: External ear normal.  Nose: Nose normal.  Mouth/Throat: Oropharynx is clear and moist.  Eyes: Conjunctivae are normal. Pupils are equal, round, and reactive to light. Right eye exhibits no discharge. Left eye exhibits no discharge.  Neck: Normal range of motion. Neck supple. No JVD present. No tracheal deviation present. No thyromegaly present.  Cardiovascular: Normal rate, regular rhythm and normal heart sounds.   Pulmonary/Chest: No stridor. No respiratory distress. She has no wheezes.  Abdominal: Soft. Bowel sounds are normal. She exhibits no distension and no mass. There is no tenderness. There is no rebound and no guarding.  Musculoskeletal: She exhibits no edema and no tenderness.  Lymphadenopathy:    She has no cervical adenopathy.  Neurological: She displays normal reflexes. No cranial nerve deficit. She exhibits normal muscle tone. Coordination normal.  Skin: No rash noted. No erythema.       pale  Psychiatric: She has a normal mood and affect. Her behavior is normal. Judgment and thought content normal.     Lab Results  Component Value Date   WBC 8.3 05/25/2012   HGB 7.9* 05/25/2012   HCT 26.5* 05/25/2012   PLT 231.0 05/25/2012   GLUCOSE 186* 05/25/2012   CHOL 157 05/25/2012   TRIG 262.0* 05/25/2012   HDL 33.60* 05/25/2012   LDLDIRECT 87.1 05/25/2012   ALT 36* 05/25/2012   AST 44* 05/25/2012   NA 137 05/25/2012   K 4.3  05/25/2012   CL 104 05/25/2012   CREATININE 0.4 05/25/2012   BUN 7 05/25/2012   CO2 25 05/25/2012   TSH 2.27 05/25/2012   HGBA1C 7.5* 09/23/2011        Assessment & Plan:

## 2012-05-28 LAB — TB SKIN TEST
Induration: 0 mm
TB Skin Test: NEGATIVE

## 2012-05-30 ENCOUNTER — Encounter: Payer: Self-pay | Admitting: Internal Medicine

## 2012-05-30 NOTE — Assessment & Plan Note (Signed)
We discussed age appropriate health related issues, including available/recomended screening tests and vaccinations. We discussed a need for adhering to healthy diet and exercise. Labs/EKG were reviewed/ordered. All questions were answered. She had a recent GYN exam PPD placed

## 2012-06-23 ENCOUNTER — Ambulatory Visit: Payer: BC Managed Care – PPO | Admitting: Internal Medicine

## 2012-06-27 ENCOUNTER — Other Ambulatory Visit: Payer: Self-pay | Admitting: Internal Medicine

## 2012-07-05 ENCOUNTER — Ambulatory Visit: Payer: BC Managed Care – PPO | Admitting: Internal Medicine

## 2012-09-29 ENCOUNTER — Ambulatory Visit: Payer: BC Managed Care – PPO | Admitting: Internal Medicine

## 2012-10-11 ENCOUNTER — Other Ambulatory Visit: Payer: Self-pay | Admitting: *Deleted

## 2012-10-11 MED ORDER — IRON POLYSACCH CMPLX-B12-FA 150-0.025-1 MG PO CAPS: ORAL_CAPSULE | ORAL | Status: DC

## 2012-10-11 MED ORDER — LIRAGLUTIDE 18 MG/3ML ~~LOC~~ SOLN: 1.8000 mg | Freq: Every day | SUBCUTANEOUS | Status: DC

## 2012-10-11 MED ORDER — OMEPRAZOLE 40 MG PO CPDR: 40.0000 mg | DELAYED_RELEASE_CAPSULE | Freq: Every day | ORAL | Status: DC

## 2012-10-20 ENCOUNTER — Ambulatory Visit: Payer: BC Managed Care – PPO | Admitting: Internal Medicine

## 2012-10-26 ENCOUNTER — Ambulatory Visit (INDEPENDENT_AMBULATORY_CARE_PROVIDER_SITE_OTHER): Payer: BC Managed Care – PPO | Admitting: Internal Medicine

## 2012-10-26 ENCOUNTER — Other Ambulatory Visit (INDEPENDENT_AMBULATORY_CARE_PROVIDER_SITE_OTHER): Payer: BC Managed Care – PPO

## 2012-10-26 ENCOUNTER — Encounter: Payer: Self-pay | Admitting: Internal Medicine

## 2012-10-26 VITALS — BP 110/60 | HR 76 | Temp 98.6°F | Resp 16 | Wt 241.0 lb

## 2012-10-26 DIAGNOSIS — E119 Type 2 diabetes mellitus without complications: Secondary | ICD-10-CM

## 2012-10-26 DIAGNOSIS — D509 Iron deficiency anemia, unspecified: Secondary | ICD-10-CM

## 2012-10-26 DIAGNOSIS — R000 Tachycardia, unspecified: Secondary | ICD-10-CM

## 2012-10-26 LAB — CBC WITH DIFFERENTIAL/PLATELET
Basophils Absolute: 0.1 10*3/uL (ref 0.0–0.1)
Eosinophils Relative: 2.5 % (ref 0.0–5.0)
HCT: 34.3 % — ABNORMAL LOW (ref 36.0–46.0)
Hemoglobin: 10.6 g/dL — ABNORMAL LOW (ref 12.0–15.0)
Lymphs Abs: 3.2 10*3/uL (ref 0.7–4.0)
MCV: 76.1 fl — ABNORMAL LOW (ref 78.0–100.0)
Monocytes Absolute: 0.5 10*3/uL (ref 0.1–1.0)
Monocytes Relative: 4.6 % (ref 3.0–12.0)
Neutro Abs: 6.6 10*3/uL (ref 1.4–7.7)
RDW: 15.4 % — ABNORMAL HIGH (ref 11.5–14.6)

## 2012-10-26 LAB — BASIC METABOLIC PANEL
CO2: 24 mEq/L (ref 19–32)
Chloride: 101 mEq/L (ref 96–112)
GFR: 130.44 mL/min (ref >60.00)
Glucose, Bld: 168 mg/dL — ABNORMAL HIGH (ref 70–99)
Potassium: 3.8 mEq/L (ref 3.5–5.1)
Sodium: 133 mEq/L — ABNORMAL LOW (ref 135–145)

## 2012-10-26 MED ORDER — HYDROCODONE-ACETAMINOPHEN 5-325 MG PO TABS: 1.0000 | ORAL_TABLET | Freq: Two times a day (BID) | ORAL | Status: AC | PRN

## 2012-10-26 NOTE — Assessment & Plan Note (Signed)
Continue with current prescription therapy as reflected on the Med list. Labs  

## 2012-10-26 NOTE — Progress Notes (Signed)
Patient ID: [name] [name], female   DOB: 05-25-1966, 46 y.o.   MRN: [id number]  Subjective:    Patient ID: [name] [name], female    DOB: [birth year], 46 y.o.   MRN: [id number]  HPI   F/u anemia C/o fatigue and SOB  better  The patient needs to address  chronic hypertension that has been well controlled with medicines; to address h/o CHF controlled with medicines as well; and to address type 2 chronic diabetes, controlled with medical treatment and diet.  BP Readings from Last 3 Encounters:  10/26/12 110/60  05/26/12 122/70  10/02/11 150/80   Wt Readings from Last 3 Encounters:  10/26/12 241 lb (109.317 kg)  05/26/12 238 lb (107.956 kg)  10/02/11 240 lb (108.863 kg)      Review of Systems  Constitutional: Positive for fatigue. Negative for fever, chills, diaphoresis, activity change, appetite change and unexpected weight change.  HENT: Negative for hearing loss, ear pain, congestion, sore throat, sneezing, mouth sores, neck pain, dental problem, voice change, postnasal drip and sinus pressure.   Eyes: Negative for pain and visual disturbance.  Respiratory: Negative for cough, chest tightness, wheezing and stridor.   Cardiovascular: Negative for chest pain, palpitations and leg swelling.  Gastrointestinal: Negative for nausea, vomiting, abdominal pain, blood in stool, abdominal distention and rectal pain.  Genitourinary: Positive for vaginal bleeding and menstrual problem. Negative for dysuria, hematuria, decreased urine volume, vaginal discharge, difficulty urinating and vaginal pain.  Musculoskeletal: Negative for back pain, joint swelling and gait problem.  Skin: Negative for color change, rash and wound.  Neurological: Negative for dizziness, tremors, syncope, speech difficulty and light-headedness.  Hematological: Negative for adenopathy.  Psychiatric/Behavioral: Negative for suicidal ideas, hallucinations, behavioral problems, confusion, sleep disturbance, dysphoric mood  and decreased concentration. The patient is not hyperactive.        Objective:   Physical Exam  Constitutional: She appears well-developed. No distress.       Obese   HENT:  Head: Normocephalic.  Right Ear: External ear normal.  Left Ear: External ear normal.  Nose: Nose normal.  Mouth/Throat: Oropharynx is clear and moist.  Eyes: Conjunctivae normal are normal. Pupils are equal, round, and reactive to light. Right eye exhibits no discharge. Left eye exhibits no discharge.  Neck: Normal range of motion. Neck supple. No JVD present. No tracheal deviation present. No thyromegaly present.  Cardiovascular: Normal rate, regular rhythm and normal heart sounds.   Pulmonary/Chest: No stridor. No respiratory distress. She has no wheezes.  Abdominal: Soft. Bowel sounds are normal. She exhibits no distension and no mass. There is no tenderness. There is no rebound and no guarding.  Musculoskeletal: She exhibits no edema and no tenderness.  Lymphadenopathy:    She has no cervical adenopathy.  Neurological: She displays normal reflexes. No cranial nerve deficit. She exhibits normal muscle tone. Coordination normal.  Skin: No rash noted. No erythema.       pale  Psychiatric: She has a normal mood and affect. Her behavior is normal. Judgment and thought content normal.     Lab Results  Component Value Date   WBC 8.9 05/26/2012   HGB 8.0 cL* 05/26/2012   HGB 8.0 cL* 05/26/2012   HCT 27.0* 05/26/2012   PLT 237.0 05/26/2012   GLUCOSE 186* 05/25/2012   CHOL 157 05/25/2012   TRIG 262.0* 05/25/2012   HDL 33.60* 05/25/2012   LDLDIRECT 87.1 05/25/2012   ALT 36* 05/25/2012   AST 44* 05/25/2012   NA 137 05/25/2012  K 4.3 05/25/2012   CL 104 05/25/2012   CREATININE 0.4 05/25/2012   BUN 7 05/25/2012   CO2 25 05/25/2012   TSH 2.27 05/25/2012   HGBA1C 6.7* 05/26/2012        Assessment & Plan:

## 2012-10-26 NOTE — Assessment & Plan Note (Signed)
Better  

## 2012-10-27 ENCOUNTER — Telehealth: Payer: Self-pay | Admitting: Internal Medicine

## 2012-10-27 NOTE — Telephone Encounter (Signed)
[name] [name], please, inform patient that all labs are better. The iron is low - pls cont iron as before Thx

## 2012-10-27 NOTE — Telephone Encounter (Signed)
Called patient per MD//no answer or VM

## 2012-10-29 NOTE — Telephone Encounter (Signed)
Called pt again no answer at home. Left smg on cell # md response...[name] [name]

## 2012-11-30 ENCOUNTER — Other Ambulatory Visit: Payer: Self-pay | Admitting: Internal Medicine

## 2012-12-24 ENCOUNTER — Other Ambulatory Visit: Payer: Self-pay | Admitting: Internal Medicine

## 2013-02-07 ENCOUNTER — Encounter: Payer: Self-pay | Admitting: Internal Medicine

## 2013-02-07 ENCOUNTER — Ambulatory Visit (INDEPENDENT_AMBULATORY_CARE_PROVIDER_SITE_OTHER): Payer: BC Managed Care – PPO | Admitting: Internal Medicine

## 2013-02-07 VITALS — BP 118/72 | HR 88 | Temp 99.5°F | Resp 16 | Wt 243.0 lb

## 2013-02-07 DIAGNOSIS — F17200 Nicotine dependence, unspecified, uncomplicated: Secondary | ICD-10-CM

## 2013-02-07 DIAGNOSIS — F411 Generalized anxiety disorder: Secondary | ICD-10-CM

## 2013-02-07 DIAGNOSIS — I509 Heart failure, unspecified: Secondary | ICD-10-CM

## 2013-02-07 DIAGNOSIS — F329 Major depressive disorder, single episode, unspecified: Secondary | ICD-10-CM

## 2013-02-07 DIAGNOSIS — E119 Type 2 diabetes mellitus without complications: Secondary | ICD-10-CM

## 2013-02-07 MED ORDER — CITALOPRAM HYDROBROMIDE 20 MG PO TABS: 20.0000 mg | ORAL_TABLET | Freq: Every day | ORAL | Status: DC

## 2013-02-07 MED ORDER — IRON POLYSACCH CMPLX-B12-FA 150-0.025-1 MG PO CAPS: 1.0000 | ORAL_CAPSULE | Freq: Every day | ORAL | Status: DC

## 2013-02-07 MED ORDER — BUPROPION HCL ER (SR) 150 MG PO TB12: 150.0000 mg | ORAL_TABLET | Freq: Two times a day (BID) | ORAL | Status: DC

## 2013-02-07 MED ORDER — LORCASERIN HCL 10 MG PO TABS: 1.0000 | ORAL_TABLET | Freq: Two times a day (BID) | ORAL | Status: DC

## 2013-02-07 NOTE — Progress Notes (Signed)
   Subjective:    HPI   C/o fatigue and SOB  better  The patient needs to address  chronic hypertension that has been well controlled with medicines; to address h/o CHF controlled with medicines as well; and to address type 2 chronic diabetes, controlled with medical treatment and diet.  BP Readings from Last 3 Encounters:  02/07/13 118/72  10/26/12 110/60  05/26/12 122/70   Wt Readings from Last 3 Encounters:  02/07/13 243 lb (110.224 kg)  10/26/12 241 lb (109.317 kg)  05/26/12 238 lb (107.956 kg)      Review of Systems  Constitutional: Positive for fatigue. Negative for fever, chills, diaphoresis, activity change, appetite change and unexpected weight change.  HENT: Negative for hearing loss, ear pain, congestion, sore throat, sneezing, mouth sores, neck pain, dental problem, voice change, postnasal drip and sinus pressure.   Eyes: Negative for pain and visual disturbance.  Respiratory: Negative for cough, chest tightness, wheezing and stridor.   Cardiovascular: Negative for chest pain, palpitations and leg swelling.  Gastrointestinal: Negative for nausea, vomiting, abdominal pain, blood in stool, abdominal distention and rectal pain.  Genitourinary: Positive for vaginal bleeding and menstrual problem. Negative for dysuria, hematuria, decreased urine volume, vaginal discharge, difficulty urinating and vaginal pain.  Musculoskeletal: Negative for back pain, joint swelling and gait problem.  Skin: Negative for color change, rash and wound.  Neurological: Negative for dizziness, tremors, syncope, speech difficulty and light-headedness.  Hematological: Negative for adenopathy.  Psychiatric/Behavioral: Negative for suicidal ideas, hallucinations, behavioral problems, confusion, sleep disturbance, dysphoric mood and decreased concentration. The patient is not hyperactive.        Objective:   Physical Exam  Constitutional: She appears well-developed. No distress.  Obese   HENT:   Head: Normocephalic.  Right Ear: External ear normal.  Left Ear: External ear normal.  Nose: Nose normal.  Mouth/Throat: Oropharynx is clear and moist.  Eyes: Conjunctivae are normal. Pupils are equal, round, and reactive to light. Right eye exhibits no discharge. Left eye exhibits no discharge.  Neck: Normal range of motion. Neck supple. No JVD present. No tracheal deviation present. No thyromegaly present.  Cardiovascular: Normal rate, regular rhythm and normal heart sounds.   Pulmonary/Chest: No stridor. No respiratory distress. She has no wheezes.  Abdominal: Soft. Bowel sounds are normal. She exhibits no distension and no mass. There is no tenderness. There is no rebound and no guarding.  Musculoskeletal: She exhibits no edema and no tenderness.  Lymphadenopathy:    She has no cervical adenopathy.  Neurological: She displays normal reflexes. No cranial nerve deficit. She exhibits normal muscle tone. Coordination normal.  Skin: No rash noted. No erythema.  pale  Psychiatric: She has a normal mood and affect. Her behavior is normal. Judgment and thought content normal.     Lab Results  Component Value Date   WBC 10.7* 10/26/2012   HGB 10.6* 10/26/2012   HCT 34.3* 10/26/2012   PLT 254.0 10/26/2012   GLUCOSE 168* 10/26/2012   CHOL 157 05/25/2012   TRIG 262.0* 05/25/2012   HDL 33.60* 05/25/2012   LDLDIRECT 87.1 05/25/2012   ALT 36* 05/25/2012   AST 44* 05/25/2012   NA 133* 10/26/2012   K 3.8 10/26/2012   CL 101 10/26/2012   CREATININE 0.5 10/26/2012   BUN 8 10/26/2012   CO2 24 10/26/2012   TSH 2.27 05/25/2012   HGBA1C 6.7* 05/26/2012        Assessment & Plan:

## 2013-02-07 NOTE — Assessment & Plan Note (Signed)
2005 - pre-eclamptic No relapse

## 2013-02-07 NOTE — Assessment & Plan Note (Signed)
Continue with current prescription therapy as reflected on the Med list.  

## 2013-02-09 NOTE — Assessment & Plan Note (Signed)
Continue with current prescription therapy as reflected on the Med list.  

## 2013-02-28 ENCOUNTER — Ambulatory Visit (INDEPENDENT_AMBULATORY_CARE_PROVIDER_SITE_OTHER): Payer: BC Managed Care – PPO

## 2013-02-28 DIAGNOSIS — E119 Type 2 diabetes mellitus without complications: Secondary | ICD-10-CM

## 2013-02-28 DIAGNOSIS — F17200 Nicotine dependence, unspecified, uncomplicated: Secondary | ICD-10-CM

## 2013-02-28 DIAGNOSIS — F329 Major depressive disorder, single episode, unspecified: Secondary | ICD-10-CM

## 2013-02-28 DIAGNOSIS — I509 Heart failure, unspecified: Secondary | ICD-10-CM

## 2013-02-28 LAB — IBC PANEL: Saturation Ratios: 4.9 % — ABNORMAL LOW (ref 20.0–50.0)

## 2013-02-28 LAB — HEPATIC FUNCTION PANEL
ALT: 34 U/L (ref 0–35)
Bilirubin, Direct: 0.1 mg/dL (ref 0.0–0.3)
Total Bilirubin: 0.8 mg/dL (ref 0.3–1.2)

## 2013-02-28 LAB — CBC WITH DIFFERENTIAL/PLATELET
Basophils Absolute: 0.1 10*3/uL (ref 0.0–0.1)
Basophils Relative: 0.9 % (ref 0.0–3.0)
Eosinophils Absolute: 0.1 10*3/uL (ref 0.0–0.7)
Hemoglobin: 9.1 g/dL — ABNORMAL LOW (ref 12.0–15.0)
Lymphocytes Relative: 28.1 % (ref 12.0–46.0)
Lymphs Abs: 2 10*3/uL (ref 0.7–4.0)
MCHC: 31.3 g/dL (ref 30.0–36.0)
MCV: 69.3 fl — ABNORMAL LOW (ref 78.0–100.0)
Monocytes Absolute: 0.3 10*3/uL (ref 0.1–1.0)
Neutro Abs: 4.7 10*3/uL (ref 1.4–7.7)
RBC: 4.19 Mil/uL (ref 3.87–5.11)
RDW: 17.6 % — ABNORMAL HIGH (ref 11.5–14.6)

## 2013-02-28 LAB — LIPID PANEL
HDL: 30.5 mg/dL — ABNORMAL LOW (ref >39.00)
Total CHOL/HDL Ratio: 6
Triglycerides: 282 mg/dL — ABNORMAL HIGH (ref 0.0–149.0)
VLDL: 56.4 mg/dL — ABNORMAL HIGH (ref 0.0–40.0)

## 2013-02-28 LAB — BASIC METABOLIC PANEL
CO2: 22 mEq/L (ref 19–32)
Calcium: 8.8 mg/dL (ref 8.4–10.5)
Chloride: 102 mEq/L (ref 96–112)
Glucose, Bld: 170 mg/dL — ABNORMAL HIGH (ref 70–99)
Sodium: 135 mEq/L (ref 135–145)

## 2013-03-01 LAB — LDL CHOLESTEROL, DIRECT: Direct LDL: 101.4 mg/dL

## 2013-03-02 ENCOUNTER — Other Ambulatory Visit: Payer: Self-pay | Admitting: Internal Medicine

## 2013-03-02 DIAGNOSIS — D509 Iron deficiency anemia, unspecified: Secondary | ICD-10-CM

## 2013-03-02 DIAGNOSIS — E119 Type 2 diabetes mellitus without complications: Secondary | ICD-10-CM

## 2013-03-02 DIAGNOSIS — R945 Abnormal results of liver function studies: Secondary | ICD-10-CM

## 2013-03-02 MED ORDER — ATORVASTATIN CALCIUM 10 MG PO TABS: 10.0000 mg | ORAL_TABLET | Freq: Every day | ORAL | Status: DC

## 2013-03-02 MED ORDER — METFORMIN HCL 500 MG PO TABS: 1000.0000 mg | ORAL_TABLET | Freq: Two times a day (BID) | ORAL | Status: DC

## 2013-03-02 NOTE — Telephone Encounter (Signed)
Pt informed

## 2013-03-02 NOTE — Telephone Encounter (Signed)
[name] [name], please, inform patient that all labs are normal except for  1. Anemia - is she taking iron? If not - restart  2. Diabetes is worse - take Metformin 1000 mg bid 3. High cholesteol: please start Lipitor 10 mg a day 4. RTC 3 mo with CBC, iron, lipids, liver, A1c  Thx

## 2013-03-02 NOTE — Addendum Note (Signed)
Addended by: Merrilyn Puma on: 03/02/2013 12:06 PM   Modules accepted: Orders

## 2013-03-14 ENCOUNTER — Other Ambulatory Visit: Payer: Self-pay

## 2013-03-14 DIAGNOSIS — Z1231 Encounter for screening mammogram for malignant neoplasm of breast: Secondary | ICD-10-CM

## 2013-03-27 ENCOUNTER — Other Ambulatory Visit: Payer: Self-pay | Admitting: Internal Medicine

## 2013-04-01 ENCOUNTER — Other Ambulatory Visit: Payer: Self-pay | Admitting: *Deleted

## 2013-04-01 NOTE — Telephone Encounter (Signed)
Received fax pt needing PA on [employer]. Called insurance spke with rep gave clinical info over phone. Med was approve with case ID [id number]. Notified pharmacy with approval status...Raechel Chute

## 2013-04-07 ENCOUNTER — Ambulatory Visit: Payer: BC Managed Care – PPO

## 2013-05-03 ENCOUNTER — Other Ambulatory Visit (INDEPENDENT_AMBULATORY_CARE_PROVIDER_SITE_OTHER): Payer: BC Managed Care – PPO

## 2013-05-03 DIAGNOSIS — E119 Type 2 diabetes mellitus without complications: Secondary | ICD-10-CM

## 2013-05-03 DIAGNOSIS — R000 Tachycardia, unspecified: Secondary | ICD-10-CM

## 2013-05-03 DIAGNOSIS — D509 Iron deficiency anemia, unspecified: Secondary | ICD-10-CM

## 2013-05-03 LAB — IBC PANEL
Saturation Ratios: 5.5 % — ABNORMAL LOW (ref 20.0–50.0)
Transferrin: 275.2 mg/dL (ref 212.0–360.0)

## 2013-05-03 LAB — BASIC METABOLIC PANEL
CO2: 24 mEq/L (ref 19–32)
Chloride: 103 mEq/L (ref 96–112)
Glucose, Bld: 137 mg/dL — ABNORMAL HIGH (ref 70–99)
Potassium: 3.3 mEq/L — ABNORMAL LOW (ref 3.5–5.1)
Sodium: 135 mEq/L (ref 135–145)

## 2013-05-03 LAB — CBC WITH DIFFERENTIAL/PLATELET
Basophils Absolute: 0.1 10*3/uL (ref 0.0–0.1)
Eosinophils Relative: 2.4 % (ref 0.0–5.0)
Lymphocytes Relative: 30.2 % (ref 12.0–46.0)
Monocytes Relative: 5.3 % (ref 3.0–12.0)
Platelets: 214 10*3/uL (ref 150.0–400.0)
RDW: 17.8 % — ABNORMAL HIGH (ref 11.5–14.6)
WBC: 8.4 10*3/uL (ref 4.5–10.5)

## 2013-05-04 ENCOUNTER — Encounter: Payer: Self-pay | Admitting: Internal Medicine

## 2013-05-04 ENCOUNTER — Ambulatory Visit (INDEPENDENT_AMBULATORY_CARE_PROVIDER_SITE_OTHER): Payer: BC Managed Care – PPO | Admitting: Internal Medicine

## 2013-05-04 VITALS — BP 116/62 | HR 72 | Temp 98.4°F | Resp 16

## 2013-05-04 DIAGNOSIS — F329 Major depressive disorder, single episode, unspecified: Secondary | ICD-10-CM

## 2013-05-04 DIAGNOSIS — D509 Iron deficiency anemia, unspecified: Secondary | ICD-10-CM

## 2013-05-04 DIAGNOSIS — E119 Type 2 diabetes mellitus without complications: Secondary | ICD-10-CM

## 2013-05-04 NOTE — Assessment & Plan Note (Signed)
Continue with current prescription therapy as reflected on the Med list.  

## 2013-05-04 NOTE — Progress Notes (Signed)
   Subjective:    HPI   C/o fatigue and SOB  Not better  The patient needs to address  chronic hypertension that has been well controlled with medicines; to address h/o CHF controlled with medicines as well; and to address type 2 chronic diabetes, controlled with medical treatment and diet.  BP Readings from Last 3 Encounters:  05/04/13 116/62  02/07/13 118/72  10/26/12 110/60   Wt Readings from Last 3 Encounters:  02/07/13 243 lb (110.224 kg)  10/26/12 241 lb (109.317 kg)  05/26/12 238 lb (107.956 kg)      Review of Systems  Constitutional: Positive for fatigue. Negative for fever, chills, diaphoresis, activity change, appetite change and unexpected weight change.  HENT: Negative for hearing loss, ear pain, congestion, sore throat, sneezing, mouth sores, neck pain, dental problem, voice change, postnasal drip and sinus pressure.   Eyes: Negative for pain and visual disturbance.  Respiratory: Negative for cough, chest tightness, wheezing and stridor.   Cardiovascular: Negative for chest pain, palpitations and leg swelling.  Gastrointestinal: Negative for nausea, vomiting, abdominal pain, blood in stool, abdominal distention and rectal pain.  Genitourinary: Positive for vaginal bleeding and menstrual problem. Negative for dysuria, hematuria, decreased urine volume, vaginal discharge, difficulty urinating and vaginal pain.  Musculoskeletal: Negative for back pain, joint swelling and gait problem.  Skin: Negative for color change, rash and wound.  Neurological: Negative for dizziness, tremors, syncope, speech difficulty and light-headedness.  Hematological: Negative for adenopathy.  Psychiatric/Behavioral: Negative for suicidal ideas, hallucinations, behavioral problems, confusion, sleep disturbance, dysphoric mood and decreased concentration. The patient is not hyperactive.        Objective:   Physical Exam  Constitutional: She appears well-developed. No distress.  Obese    HENT:  Head: Normocephalic.  Right Ear: External ear normal.  Left Ear: External ear normal.  Nose: Nose normal.  Mouth/Throat: Oropharynx is clear and moist.  Eyes: Conjunctivae are normal. Pupils are equal, round, and reactive to light. Right eye exhibits no discharge. Left eye exhibits no discharge.  Neck: Normal range of motion. Neck supple. No JVD present. No tracheal deviation present. No thyromegaly present.  Cardiovascular: Normal rate, regular rhythm and normal heart sounds.   Pulmonary/Chest: No stridor. No respiratory distress. She has no wheezes.  Abdominal: Soft. Bowel sounds are normal. She exhibits no distension and no mass. There is no tenderness. There is no rebound and no guarding.  Musculoskeletal: She exhibits no edema and no tenderness.  Lymphadenopathy:    She has no cervical adenopathy.  Neurological: She displays normal reflexes. No cranial nerve deficit. She exhibits normal muscle tone. Coordination normal.  Skin: No rash noted. No erythema.  pale  Psychiatric: She has a normal mood and affect. Her behavior is normal. Judgment and thought content normal.     Lab Results  Component Value Date   WBC 8.4 05/03/2013   HGB 8.6 Repeated and verified X2.* 05/03/2013   HCT 27.0* 05/03/2013   PLT 214.0 05/03/2013   GLUCOSE 137* 05/03/2013   CHOL 170 02/28/2013   TRIG 282.0* 02/28/2013   HDL 30.50* 02/28/2013   LDLDIRECT 101.4 02/28/2013   ALT 34 02/28/2013   AST 30 02/28/2013   NA 135 05/03/2013   K 3.3* 05/03/2013   CL 103 05/03/2013   CREATININE 0.6 05/03/2013   BUN 9 05/03/2013   CO2 24 05/03/2013   TSH 1.81 02/28/2013   HGBA1C 7.5* 02/28/2013        Assessment & Plan:

## 2013-05-04 NOTE — Assessment & Plan Note (Signed)
5/14 not responding to po iron Hem consult -- ?IV iron Hysterectomy suggested

## 2013-05-10 ENCOUNTER — Telehealth: Payer: Self-pay | Admitting: Internal Medicine

## 2013-05-10 NOTE — Telephone Encounter (Signed)
LVOM FOR PT TO RETURN CALL IN RE NP APPT.  °

## 2013-05-13 ENCOUNTER — Telehealth: Payer: Self-pay | Admitting: Oncology

## 2013-05-13 NOTE — Telephone Encounter (Signed)
S/W PT IN RE TO NP APPT 07/01 @ 1:30 W/DR. SHADAD REFERRING DR. Posey Rea DX-MICROCYTIC ANEMIA WELCOME PACKET MAILED

## 2013-05-17 ENCOUNTER — Telehealth: Payer: Self-pay | Admitting: Oncology

## 2013-05-17 NOTE — Telephone Encounter (Signed)
C/D 05/17/13 for appt. 06/21/13

## 2013-06-17 ENCOUNTER — Other Ambulatory Visit: Payer: Self-pay | Admitting: Oncology

## 2013-06-17 DIAGNOSIS — D509 Iron deficiency anemia, unspecified: Secondary | ICD-10-CM

## 2013-06-21 ENCOUNTER — Telehealth: Payer: Self-pay | Admitting: Oncology

## 2013-06-21 ENCOUNTER — Ambulatory Visit (HOSPITAL_BASED_OUTPATIENT_CLINIC_OR_DEPARTMENT_OTHER): Payer: BC Managed Care – PPO | Admitting: Oncology

## 2013-06-21 ENCOUNTER — Other Ambulatory Visit (HOSPITAL_BASED_OUTPATIENT_CLINIC_OR_DEPARTMENT_OTHER): Payer: BC Managed Care – PPO | Admitting: Lab

## 2013-06-21 ENCOUNTER — Ambulatory Visit: Payer: BC Managed Care – PPO

## 2013-06-21 ENCOUNTER — Encounter: Payer: Self-pay | Admitting: Oncology

## 2013-06-21 VITALS — BP 148/75 | HR 99 | Temp 97.1°F | Resp 18 | Ht 68.0 in | Wt 237.3 lb

## 2013-06-21 DIAGNOSIS — D509 Iron deficiency anemia, unspecified: Secondary | ICD-10-CM

## 2013-06-21 DIAGNOSIS — N920 Excessive and frequent menstruation with regular cycle: Secondary | ICD-10-CM

## 2013-06-21 LAB — COMPREHENSIVE METABOLIC PANEL (CC13)
Albumin: 3.3 g/dL — ABNORMAL LOW (ref 3.5–5.0)
Alkaline Phosphatase: 93 U/L (ref 40–150)
BUN: 5.9 mg/dL — ABNORMAL LOW (ref 7.0–26.0)
Calcium: 8.8 mg/dL (ref 8.4–10.4)
Creatinine: 0.7 mg/dL (ref 0.6–1.1)
Glucose: 349 mg/dl — ABNORMAL HIGH (ref 70–140)
Potassium: 4 mEq/L (ref 3.5–5.1)

## 2013-06-21 LAB — CHCC SMEAR

## 2013-06-21 LAB — IRON AND TIBC CHCC
Iron: 25 ug/dL — ABNORMAL LOW (ref 41–142)
TIBC: 391 ug/dL (ref 236–444)

## 2013-06-21 LAB — CBC WITH DIFFERENTIAL/PLATELET
Basophils Absolute: 0.1 10*3/uL (ref 0.0–0.1)
Eosinophils Absolute: 0.2 10*3/uL (ref 0.0–0.5)
HCT: 27.5 % — ABNORMAL LOW (ref 34.8–46.6)
HGB: 8.4 g/dL — ABNORMAL LOW (ref 11.6–15.9)
LYMPH%: 26 % (ref 14.0–49.7)
MCV: 68.2 fL — ABNORMAL LOW (ref 79.5–101.0)
MONO%: 4.2 % (ref 0.0–14.0)
NEUT#: 5.4 10*3/uL (ref 1.5–6.5)
NEUT%: 66.8 % (ref 38.4–76.8)
Platelets: 231 10*3/uL (ref 145–400)
RDW: 18.5 % — ABNORMAL HIGH (ref 11.2–14.5)

## 2013-06-21 LAB — FERRITIN CHCC: Ferritin: 5 ng/ml — ABNORMAL LOW (ref 9–269)

## 2013-06-21 NOTE — Progress Notes (Signed)
Checked in new pt with no financial concerns. °

## 2013-06-21 NOTE — Progress Notes (Signed)
Reason for Referral: Anemia.   HPI: [name] [name] is a pleasant 47 year old woman referred to me for the evaluation of iron deficiency anemia. She is a woman with past medical history significant for diabetes and depression but was noted to have microcytic anemia noted back in June of 2013. At that time your hemoglobin was 7.9 with an MCV of 67.7 her RDW was 19.9 which was elevated normal white cell count and platelets at that time. Her iron stores noted to have line with a saturation of 10.6%. The iron levels in the last year or so were consistently low with iron level between 23 and 21. And her hemoglobin has ranged between 8.0-9.1 without completely normalized. She had been on iron replacement on and off for the last year but mostly 1 tablet a day. She does not report any problems with the iron replacement she does report some dyspepsia although she is not clear if that's caused by other medication that she is taking. She has not reported any nausea or vomiting had not reported any constipation she does report some loose bowel habits. On one occasion she reported 1 small episode of blood in the stool but no consistent or persistent hematochezia or melena. She has not reported any hemoptysis or hematemesis had not reported any GU bleeding. She does reports a rather lengthy menstrual cycles lasting between 7-10 days which a lot longer she had been in previous years.  Clinically, she have been symptomatic from this with fatigue and tiredness and ice cravings but no shortness of breath or chest pain. She did not report any bleeding episodes or epistaxis. She has not reported any constitutional symptoms of weight loss or fevers overall she continued to work full-time without any hindrance or decline.   Past Medical History  Diagnosis Date  . 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 Anxiety   . Depression   . Diabetes mellitus     type II  .  Hydradenitis     right  :  Past Surgical History  Procedure Laterality Date  . Cholecystectomy  2006  :  Current Outpatient Prescriptions  Medication Sig Dispense Refill  . ALPRAZolam (XANAX) 0.5 MG tablet Take 1 tablet (0.5 mg total) by mouth 2 (two) times daily as needed.  60 tablet  1  . atorvastatin (LIPITOR) 10 MG tablet Take 1 tablet (10 mg total) by mouth daily.  90 tablet  3  . BD PEN NEEDLE NANO U/F 32G X 4 MM MISC USE 1 AS DIRECTED  1 each  1  . buPROPion (WELLBUTRIN SR) 150 MG 12 hr tablet Take 1 tablet (150 mg total) by mouth 2 (two) times daily.  180 tablet  3  . Cholecalciferol (EQL VITAMIN D3) 1000 UNITS tablet Take 1,000 Units by mouth daily.        . citalopram (CELEXA) 20 MG tablet Take 1 tablet (20 mg total) by mouth daily.  90 tablet  3  . cyclobenzaprine (FLEXERIL) 5 MG tablet 1 tablet at bedtime  30 tablet  5  . Iron Polysacch Cmplx-B12-FA (FERREX 150 FORTE) 150-0.025-1 MG CAPS Take 1 tablet by mouth daily.  30 each  11  . Liraglutide (VICTOZA) 18 MG/3ML SOLN Inject 0.3 mLs (1.8 mg total) into the skin daily.  27 mL  3  . Lorcaserin HCl (BELVIQ) 10 MG TABS Take 1 tablet by mouth 2 (two) times daily.  60 tablet  3  .  metFORMIN (GLUCOPHAGE) 500 MG tablet Take 2 tablets (1,000 mg total) by mouth 2 (two) times daily with a meal.  360 tablet  3  . NIFEdipine (PROCARDIA XL/ADALAT-CC) 90 MG 24 hr tablet TAKE 1 TABLET BY MOUTH DAILY  90 tablet  0  . omeprazole (PRILOSEC) 40 MG capsule Take 1 capsule (40 mg total) by mouth daily.  90 capsule  3   No current facility-administered medications for this visit.       No Known Allergies:  Family History  Problem Relation Age of Onset  . Hypertension Other   . Diabetes Mother   . Hypertension Mother   :  History   Social History  . Marital Status: Married    Spouse Name: N/A    Number of Children: N/A  . Years of Education: N/A   Occupational History  . Not on file.   Social History Main Topics  . Smoking  status: Current Some Day Smoker  . Smokeless tobacco: Not on file  . Alcohol Use: No  . Drug Use: No  . Sexually Active: Yes   Other Topics Concern  . Not on file   Social History Narrative  . No narrative on file  :  A comprehensive review of systems was negative.  Exam:ECOG 0 Blood pressure 148/75, pulse 99, temperature 97.1 F (36.2 C), temperature source Oral, resp. rate 18, height 5\' 8"  (1.727 m), weight 237 lb 4.8 oz (107.639 kg). General appearance: alert, cooperative and appears stated age Head: Normocephalic, without obvious abnormality, atraumatic Throat: lips, mucosa, and tongue normal; teeth and gums normal Neck: no adenopathy, no carotid bruit, no JVD, supple, symmetrical, trachea midline and thyroid not enlarged, symmetric, no tenderness/mass/nodules Resp: clear to auscultation bilaterally Cardio: regular rate and rhythm, S1, S2 normal, no murmur, click, rub or gallop GI: soft, non-tender; bowel sounds normal; no masses,  no organomegaly Extremities: extremities normal, atraumatic, no cyanosis or edema Skin: Skin color, texture, turgor normal. No rashes or lesions   Recent Labs  06/21/13 1344  WBC 8.1  HGB 8.4*  HCT 27.5*  PLT 231   No results found for this basename: NA, K, CL, CO2, GLUCOSE, BUN, CREATININE, CALCIUM,  in the last 72 hours    Assessment and Plan:   47 year old woman with the following issues:  1. Microcytic hypochromic anemia with documented findings of iron deficiency anemia. Most likely etiology is due to chronic menstrual losses that accumulated over the years. Although she does not report menorrhagia she does report that her menstrual cycles are longer than have been in the past. Other etiologies include GI blood losses she had not had a GI workup but I would recommend if she has any hematochezia she will be referred for gastroenterology. For management standpoint, I given her the option of increase oral iron the multiple times a day  versus IV iron. The risks and benefits of both approaches were discussed and she is interested in IV iron. I discussed with her the logistics of administration of Feraheme iron preparation. Complications from that that includes infusion-related toxicity such as arthralgias, myalgias and rarely anaphylaxis. She is agreeable to proceed and we'll set that up for her in the near future. I last her to come back in about 3 months to repeat her blood counts and explained to her that she might need more IV iron in the future if she continues to have chronic blood loss.  2. Heavy menstrual cycles: I've asked her to followup with her gynecologist regarding  that as that could be the reason for her iron deficiency anemia.

## 2013-06-21 NOTE — Telephone Encounter (Signed)
gv pt appt schedule for July and October.

## 2013-06-22 ENCOUNTER — Ambulatory Visit (HOSPITAL_BASED_OUTPATIENT_CLINIC_OR_DEPARTMENT_OTHER): Payer: BC Managed Care – PPO

## 2013-06-22 VITALS — BP 127/67 | HR 99 | Temp 98.3°F | Resp 14

## 2013-06-22 DIAGNOSIS — D509 Iron deficiency anemia, unspecified: Secondary | ICD-10-CM

## 2013-06-22 MED ORDER — SODIUM CHLORIDE 0.9 % IV SOLN
Freq: Once | INTRAVENOUS | Status: AC
  Administered 2013-06-22: 15:00:00 via INTRAVENOUS

## 2013-06-22 MED ORDER — SODIUM CHLORIDE 0.9 % IV SOLN
1020.0000 mg | Freq: Once | INTRAVENOUS | Status: AC
  Administered 2013-06-22: 1020 mg via INTRAVENOUS
  Filled 2013-06-22: qty 34

## 2013-06-22 NOTE — Patient Instructions (Signed)
Ferumoxytol injection What is this medicine? FERUMOXYTOL is an iron complex. Iron is used to make healthy red blood cells, which carry oxygen and nutrients throughout the body. This medicine is used to treat iron deficiency anemia in people with chronic kidney disease. This medicine may be used for other purposes; ask your health care provider or pharmacist if you have questions. What should I tell my health care provider before I take this medicine? They need to know if you have any of these conditions: -anemia not caused by low iron levels -high levels of iron in the blood -magnetic resonance imaging (MRI) test scheduled -an unusual or allergic reaction to iron, other medicines, foods, dyes, or preservatives -pregnant or trying to get pregnant -breast-feeding How should I use this medicine? This medicine is for infusion into a vein. It is given by a health care professional in a hospital or clinic setting. Talk to your pediatrician regarding the use of this medicine in children. Special care may be needed. Overdosage: If you think you've taken too much of this medicine contact a poison control center or emergency room at once. Overdosage: If you think you have taken too much of this medicine contact a poison control center or emergency room at once. NOTE: This medicine is only for you. Do not share this medicine with others. What if I miss a dose? It is important not to miss your dose. Call your doctor or health care professional if you are unable to keep an appointment. What may interact with this medicine? This medicine may interact with the following medications: -other iron products This list may not describe all possible interactions. Give your health care provider a list of all the medicines, herbs, non-prescription drugs, or dietary supplements you use. Also tell them if you smoke, drink alcohol, or use illegal drugs. Some items may interact with your medicine. What should I watch  for while using this medicine? Visit your doctor or healthcare professional regularly. Tell your doctor or healthcare professional if your symptoms do not start to get better or if they get worse. You may need blood work done while you are taking this medicine. You may need to follow a special diet. Talk to your doctor. Foods that contain iron include: whole grains/cereals, dried fruits, beans, or peas, leafy green vegetables, and organ meats (liver, kidney). What side effects may I notice from receiving this medicine? Side effects that you should report to your doctor or health care professional as soon as possible: -allergic reactions like skin rash, itching or hives, swelling of the face, lips, or tongue -breathing problems -changes in blood pressure -feeling faint or lightheaded, falls -fever or chills -flushing, sweating, or hot feelings -swelling of the ankles or feet Side effects that usually do not require medical attention (Report these to your doctor or health care professional if they continue or are bothersome.): -diarrhea -headache -nausea, vomiting -stomach pain This list may not describe all possible side effects. Call your doctor for medical advice about side effects. You may report side effects to FDA at 1-800-FDA-1088. Where should I keep my medicine? This drug is given in a hospital or clinic and will not be stored at home. NOTE: This sheet is a summary. It may not cover all possible information. If you have questions about this medicine, talk to your doctor, pharmacist, or health care provider.  2013, [employer]. (08/30/2008 9:48:25 PM)  

## 2013-07-15 ENCOUNTER — Other Ambulatory Visit: Payer: Self-pay | Admitting: Internal Medicine

## 2013-07-25 ENCOUNTER — Other Ambulatory Visit: Payer: Self-pay | Admitting: Internal Medicine

## 2013-08-02 DIAGNOSIS — Z0279 Encounter for issue of other medical certificate: Secondary | ICD-10-CM

## 2013-09-10 ENCOUNTER — Other Ambulatory Visit: Payer: Self-pay | Admitting: Internal Medicine

## 2013-09-21 ENCOUNTER — Telehealth: Payer: Self-pay | Admitting: Oncology

## 2013-09-21 ENCOUNTER — Ambulatory Visit (HOSPITAL_BASED_OUTPATIENT_CLINIC_OR_DEPARTMENT_OTHER): Payer: BC Managed Care – PPO | Admitting: Oncology

## 2013-09-21 ENCOUNTER — Other Ambulatory Visit (HOSPITAL_BASED_OUTPATIENT_CLINIC_OR_DEPARTMENT_OTHER): Payer: BC Managed Care – PPO | Admitting: Lab

## 2013-09-21 VITALS — BP 147/82 | HR 108 | Temp 97.7°F | Resp 18 | Ht 68.0 in | Wt 242.4 lb

## 2013-09-21 DIAGNOSIS — D509 Iron deficiency anemia, unspecified: Secondary | ICD-10-CM

## 2013-09-21 LAB — COMPREHENSIVE METABOLIC PANEL (CC13)
ALT: 21 U/L (ref 0–55)
AST: 20 U/L (ref 5–34)
CO2: 22 mEq/L (ref 22–29)
Calcium: 9.2 mg/dL (ref 8.4–10.4)
Chloride: 104 mEq/L (ref 98–109)
Sodium: 135 mEq/L — ABNORMAL LOW (ref 136–145)
Total Protein: 7.1 g/dL (ref 6.4–8.3)

## 2013-09-21 LAB — CBC WITH DIFFERENTIAL/PLATELET
BASO%: 1.1 % (ref 0.0–2.0)
MCHC: 33 g/dL (ref 31.5–36.0)
MONO#: 0.4 10*3/uL (ref 0.1–0.9)
RBC: 4.38 10*6/uL (ref 3.70–5.45)
RDW: 15.4 % — ABNORMAL HIGH (ref 11.2–14.5)
WBC: 9.9 10*3/uL (ref 3.9–10.3)
lymph#: 2.5 10*3/uL (ref 0.9–3.3)

## 2013-09-21 NOTE — Telephone Encounter (Signed)
gv adn printed appt sched and avs for pt for Jan 2015 °

## 2013-09-21 NOTE — Progress Notes (Signed)
Hematology and Oncology Follow Up Visit  [name] [name] [id number] [birth year] 47 y.o. 09/21/2013 3:56 PM [name] [name], MDPlotnikov, [name] Quint, MD   Principle Diagnosis: 47 year old woman diagnosed with iron deficiency anemia after she presented in July of 2014 with a hemoglobin of 8.4. Her iron studies showed iron level of 25 and ferritin of 5.   Prior Therapy: She is status post IV iron in the form of Feraheme given on 06/22/2013.  Current therapy: Observation and followup.  Interim History: This is a pleasant 47 year old woman presents today for a followup visit after her initial IV iron infusion on 06/22/2013. She tolerated it very well and noticed significant improvement in her activity level and performance status. She reports her fatigue is much improved although not completely resolved. She is not reporting any symptoms of chest pain or shortness of breath. Has not reported any more hematochezia or melena. She does report menorrhagia that has not completely subsided. She did not report any complications from the IV iron.  Medications: I have reviewed the patient's current medications.  Current Outpatient Prescriptions  Medication Sig Dispense Refill  . ALPRAZolam (XANAX) 0.5 MG tablet Take 1 tablet (0.5 mg total) by mouth 2 (two) times daily as needed.  60 tablet  1  . atorvastatin (LIPITOR) 10 MG tablet Take 1 tablet (10 mg total) by mouth daily.  90 tablet  3  . BD PEN NEEDLE NANO U/F 32G X 4 MM MISC USE 1 AS DIRECTED  1 each  0  . buPROPion (WELLBUTRIN SR) 150 MG 12 hr tablet Take 1 tablet (150 mg total) by mouth 2 (two) times daily.  180 tablet  3  . Cholecalciferol (EQL VITAMIN D3) 1000 UNITS tablet Take 1,000 Units by mouth daily.        . citalopram (CELEXA) 20 MG tablet Take 1 tablet (20 mg total) by mouth daily.  90 tablet  3  . cyclobenzaprine (FLEXERIL) 5 MG tablet 1 tablet at bedtime  30 tablet  5  . Iron Polysacch Cmplx-B12-FA (FERREX 150 FORTE) 150-0.025-1 MG CAPS  Take 1 tablet by mouth daily.  30 each  11  . Liraglutide (VICTOZA) 18 MG/3ML SOLN Inject 0.3 mLs (1.8 mg total) into the skin daily.  27 mL  3  . metFORMIN (GLUCOPHAGE) 500 MG tablet Take 2 tablets (1,000 mg total) by mouth 2 (two) times daily with a meal.  360 tablet  3  . NIFEdipine (PROCARDIA XL/ADALAT-CC) 90 MG 24 hr tablet TAKE 1 TABLET DAILY  90 tablet  2  . omeprazole (PRILOSEC) 40 MG capsule Take 1 capsule (40 mg total) by mouth daily.  90 capsule  3   No current facility-administered medications for this visit.     Allergies: No Known Allergies  Past Medical History, Surgical history, Social history, and Family History were reviewed and updated.  Review of Systems:  Remaining ROS negative. Physical Exam: Blood pressure 147/82, pulse 108, temperature 97.7 F (36.5 C), temperature source Oral, resp. rate 18, height 5\' 8"  (1.727 m), weight 242 lb 6.4 oz (109.952 kg). ECOG:  General appearance: alert, cooperative and appears stated age Head: Normocephalic, without obvious abnormality, atraumatic Neck: no adenopathy, no carotid bruit, no JVD, supple, symmetrical, trachea midline and thyroid not enlarged, symmetric, no tenderness/mass/nodules Lymph nodes: Cervical, supraclavicular, and axillary nodes normal. Heart:regular rate and rhythm, S1, S2 normal, no murmur, click, rub or gallop Lung:chest clear, no wheezing, rales, normal symmetric air entry Abdomin: soft, non-tender, without masses or organomegaly EXT:no erythema, induration,  or nodules   Lab Results: Lab Results  Component Value Date   WBC 9.9 09/21/2013   HGB 11.9 09/21/2013   HCT 35.9 09/21/2013   MCV 82.1 09/21/2013   PLT 223 09/21/2013     Chemistry      Component Value Date/Time   NA 134* 06/21/2013 1344   NA 135 05/03/2013 1549   K 4.0 06/21/2013 1344   K 3.3* 05/03/2013 1549   CL 103 05/03/2013 1549   CO2 24 06/21/2013 1344   CO2 24 05/03/2013 1549   BUN 5.9* 06/21/2013 1344   BUN 9 05/03/2013 1549   CREATININE  0.7 06/21/2013 1344   CREATININE 0.6 05/03/2013 1549      Component Value Date/Time   CALCIUM 8.8 06/21/2013 1344   CALCIUM 8.1* 05/03/2013 1549   ALKPHOS 93 06/21/2013 1344   ALKPHOS 81 02/28/2013 1215   AST 34 06/21/2013 1344   AST 30 02/28/2013 1215   ALT 29 06/21/2013 1344   ALT 34 02/28/2013 1215   BILITOT 0.63 06/21/2013 1344   BILITOT 0.8 02/28/2013 1215        Impression and Plan:  47 year old woman with the following issues:  1. Microcytic hypochromic anemia with documented findings of iron deficiency anemia. She is status post Feraheme infusion on 06/22/2013. This was well-tolerated and her hemoglobin had normalized at this time. The plan is to continue with active surveillance and monitoring her blood counts for future need to of IV iron infusion. She understands as long as she is having menorrhagia this will continue to be a potential problem and we will continue to follow her on a regular basis.  2. Heavy menstrual cycles: She will followup with her gynecologist regarding that as that could be the reason for her iron deficiency anemia.   [name] Wachter, MD 10/1/20143:56 PM

## 2013-09-22 LAB — IRON AND TIBC CHCC: %SAT: 9 % — ABNORMAL LOW (ref 21–57)

## 2013-09-22 LAB — FERRITIN CHCC: Ferritin: 9 ng/ml (ref 9–269)

## 2013-10-27 ENCOUNTER — Other Ambulatory Visit: Payer: Self-pay

## 2013-11-03 ENCOUNTER — Other Ambulatory Visit: Payer: Self-pay | Admitting: Internal Medicine

## 2013-11-09 ENCOUNTER — Ambulatory Visit (INDEPENDENT_AMBULATORY_CARE_PROVIDER_SITE_OTHER): Payer: BC Managed Care – PPO | Admitting: Internal Medicine

## 2013-11-09 ENCOUNTER — Encounter: Payer: Self-pay | Admitting: Internal Medicine

## 2013-11-09 VITALS — BP 150/92 | HR 88 | Temp 97.8°F | Resp 16 | Wt 243.0 lb

## 2013-11-09 DIAGNOSIS — J019 Acute sinusitis, unspecified: Secondary | ICD-10-CM | POA: Insufficient documentation

## 2013-11-09 DIAGNOSIS — E119 Type 2 diabetes mellitus without complications: Secondary | ICD-10-CM

## 2013-11-09 DIAGNOSIS — J209 Acute bronchitis, unspecified: Secondary | ICD-10-CM

## 2013-11-09 DIAGNOSIS — J45909 Unspecified asthma, uncomplicated: Secondary | ICD-10-CM | POA: Insufficient documentation

## 2013-11-09 MED ORDER — FLUTICASONE FUROATE-VILANTEROL 100-25 MCG/INH IN AEPB: 1.0000 | INHALATION_SPRAY | Freq: Every day | RESPIRATORY_TRACT | Status: DC

## 2013-11-09 MED ORDER — PROMETHAZINE-CODEINE 6.25-10 MG/5ML PO SYRP: 5.0000 mL | ORAL_SOLUTION | ORAL | Status: DC | PRN

## 2013-11-09 MED ORDER — AMOXICILLIN-POT CLAVULANATE 875-125 MG PO TABS: 1.0000 | ORAL_TABLET | Freq: Two times a day (BID) | ORAL | Status: DC

## 2013-11-09 NOTE — Progress Notes (Signed)
Pre visit review using our clinic review tool, if applicable. No additional management support is needed unless otherwise documented below in the visit note. 

## 2013-11-09 NOTE — Progress Notes (Signed)
Subjective:    HPI  C/o URI x2.5 weeks  F/u fatigue and SOB  Not better  The patient needs to address  chronic hypertension that has been well controlled with medicines; to address h/o CHF controlled with medicines as well; and to address type 2 chronic diabetes, controlled with medical treatment and diet.  BP Readings from Last 3 Encounters:  11/09/13 150/92  09/21/13 147/82  06/22/13 127/67   Wt Readings from Last 3 Encounters:  11/09/13 243 lb (110.224 kg)  09/21/13 242 lb 6.4 oz (109.952 kg)  06/21/13 237 lb 4.8 oz (107.639 kg)      Review of Systems  Constitutional: Positive for fatigue. Negative for fever, chills, diaphoresis, activity change, appetite change and unexpected weight change.  HENT: Negative for congestion, dental problem, ear pain, hearing loss, mouth sores, postnasal drip, sinus pressure, sneezing, sore throat and voice change.   Eyes: Negative for pain and visual disturbance.  Respiratory: Negative for cough, chest tightness, wheezing and stridor.   Cardiovascular: Negative for chest pain, palpitations and leg swelling.  Gastrointestinal: Negative for nausea, vomiting, abdominal pain, blood in stool, abdominal distention and rectal pain.  Genitourinary: Positive for vaginal bleeding and menstrual problem. Negative for dysuria, hematuria, decreased urine volume, vaginal discharge, difficulty urinating and vaginal pain.  Musculoskeletal: Negative for back pain, gait problem, joint swelling and neck pain.  Skin: Negative for color change, rash and wound.  Neurological: Negative for dizziness, tremors, syncope, speech difficulty and light-headedness.  Hematological: Negative for adenopathy.  Psychiatric/Behavioral: Negative for suicidal ideas, hallucinations, behavioral problems, confusion, sleep disturbance, dysphoric mood and decreased concentration. The patient is not hyperactive.        Objective:   Physical Exam  Constitutional: She appears  well-developed. No distress.  Obese   HENT:  Head: Normocephalic.  Right Ear: External ear normal.  Left Ear: External ear normal.  Nose: Nose normal.  Mouth/Throat: Oropharynx is clear and moist.  Eyes: Conjunctivae are normal. Pupils are equal, round, and reactive to light. Right eye exhibits no discharge. Left eye exhibits no discharge.  Neck: Normal range of motion. Neck supple. No JVD present. No tracheal deviation present. No thyromegaly present.  Cardiovascular: Normal rate, regular rhythm and normal heart sounds.   Pulmonary/Chest: No stridor. No respiratory distress. She has no wheezes.  Abdominal: Soft. Bowel sounds are normal. She exhibits no distension and no mass. There is no tenderness. There is no rebound and no guarding.  Musculoskeletal: She exhibits no edema and no tenderness.  Lymphadenopathy:    She has no cervical adenopathy.  Neurological: She displays normal reflexes. No cranial nerve deficit. She exhibits normal muscle tone. Coordination normal.  Skin: No rash noted. No erythema.  pale  Psychiatric: She has a normal mood and affect. Her behavior is normal. Judgment and thought content normal.     Lab Results  Component Value Date   WBC 9.9 09/21/2013   HGB 11.9 09/21/2013   HCT 35.9 09/21/2013   PLT 223 09/21/2013   GLUCOSE 312* 09/21/2013   CHOL 170 02/28/2013   TRIG 282.0* 02/28/2013   HDL 30.50* 02/28/2013   LDLDIRECT 101.4 02/28/2013   ALT 21 09/21/2013   AST 20 09/21/2013   NA 135* 09/21/2013   K 4.0 09/21/2013   CL 103 05/03/2013   CREATININE 0.8 09/21/2013   BUN 5.5* 09/21/2013   CO2 22 09/21/2013   TSH 1.81 02/28/2013   HGBA1C 7.5* 02/28/2013    I personally provided Breo inhaler use teaching. After the  teaching patient was able to demonstrate it's use effectively. All questions were answered     Assessment & Plan:

## 2013-11-09 NOTE — Assessment & Plan Note (Signed)
11/14 exacerbation due to URI CXR if worse Augmentin x 10 d Prom-cod Breo

## 2013-11-12 NOTE — Assessment & Plan Note (Signed)
PO abx

## 2013-11-12 NOTE — Assessment & Plan Note (Signed)
Continue with current prescription therapy as reflected on the Med list.  

## 2013-12-08 ENCOUNTER — Telehealth: Payer: Self-pay | Admitting: Oncology

## 2013-12-08 NOTE — Telephone Encounter (Signed)
lvm for pt regarding to Jan appt change in d/t...mailed pt appt sched/avs and letter

## 2014-01-09 ENCOUNTER — Other Ambulatory Visit: Payer: Self-pay | Admitting: Internal Medicine

## 2014-01-10 ENCOUNTER — Other Ambulatory Visit: Payer: BC Managed Care – PPO

## 2014-01-10 ENCOUNTER — Ambulatory Visit: Payer: BC Managed Care – PPO | Admitting: Oncology

## 2014-01-13 ENCOUNTER — Ambulatory Visit: Payer: BC Managed Care – PPO | Admitting: Oncology

## 2014-01-13 ENCOUNTER — Other Ambulatory Visit: Payer: BC Managed Care – PPO

## 2014-01-17 ENCOUNTER — Telehealth: Payer: Self-pay | Admitting: Oncology

## 2014-01-17 NOTE — Telephone Encounter (Signed)
s.w. pt and r/s appt to 2.6.15 in the am per pt request..pt ok and aware

## 2014-01-26 ENCOUNTER — Other Ambulatory Visit: Payer: Self-pay | Admitting: Internal Medicine

## 2014-01-27 ENCOUNTER — Ambulatory Visit (HOSPITAL_BASED_OUTPATIENT_CLINIC_OR_DEPARTMENT_OTHER): Payer: BC Managed Care – PPO | Admitting: Oncology

## 2014-01-27 ENCOUNTER — Telehealth: Payer: Self-pay | Admitting: Oncology

## 2014-01-27 ENCOUNTER — Encounter: Payer: Self-pay | Admitting: Oncology

## 2014-01-27 ENCOUNTER — Other Ambulatory Visit (HOSPITAL_BASED_OUTPATIENT_CLINIC_OR_DEPARTMENT_OTHER): Payer: BC Managed Care – PPO

## 2014-01-27 ENCOUNTER — Telehealth: Payer: Self-pay | Admitting: *Deleted

## 2014-01-27 VITALS — BP 143/85 | HR 103 | Temp 97.0°F | Resp 18 | Ht 68.0 in | Wt 241.4 lb

## 2014-01-27 DIAGNOSIS — D509 Iron deficiency anemia, unspecified: Secondary | ICD-10-CM

## 2014-01-27 DIAGNOSIS — N920 Excessive and frequent menstruation with regular cycle: Secondary | ICD-10-CM

## 2014-01-27 LAB — CBC WITH DIFFERENTIAL/PLATELET
BASO%: 0.9 % (ref 0.0–2.0)
Basophils Absolute: 0.1 10*3/uL (ref 0.0–0.1)
EOS%: 1.9 % (ref 0.0–7.0)
Eosinophils Absolute: 0.2 10*3/uL (ref 0.0–0.5)
HEMATOCRIT: 34.9 % (ref 34.8–46.6)
HEMOGLOBIN: 11.1 g/dL — AB (ref 11.6–15.9)
LYMPH%: 17.1 % (ref 14.0–49.7)
MCH: 23.9 pg — ABNORMAL LOW (ref 25.1–34.0)
MCHC: 31.8 g/dL (ref 31.5–36.0)
MCV: 75.2 fL — ABNORMAL LOW (ref 79.5–101.0)
MONO#: 0.4 10*3/uL (ref 0.1–0.9)
MONO%: 4.5 % (ref 0.0–14.0)
NEUT#: 7.3 10*3/uL — ABNORMAL HIGH (ref 1.5–6.5)
NEUT%: 75.6 % (ref 38.4–76.8)
Platelets: 239 10*3/uL (ref 145–400)
RBC: 4.64 10*6/uL (ref 3.70–5.45)
RDW: 16 % — ABNORMAL HIGH (ref 11.2–14.5)
WBC: 9.7 10*3/uL (ref 3.9–10.3)
lymph#: 1.7 10*3/uL (ref 0.9–3.3)

## 2014-01-27 LAB — COMPREHENSIVE METABOLIC PANEL (CC13)
ALT: 28 U/L (ref 0–55)
ANION GAP: 10 meq/L (ref 3–11)
AST: 25 U/L (ref 5–34)
Albumin: 3.6 g/dL (ref 3.5–5.0)
Alkaline Phosphatase: 95 U/L (ref 40–150)
BUN: 5.6 mg/dL — ABNORMAL LOW (ref 7.0–26.0)
CO2: 24 mEq/L (ref 22–29)
CREATININE: 0.7 mg/dL (ref 0.6–1.1)
Calcium: 9.2 mg/dL (ref 8.4–10.4)
Chloride: 102 mEq/L (ref 98–109)
Glucose: 240 mg/dl — ABNORMAL HIGH (ref 70–140)
Potassium: 4 mEq/L (ref 3.5–5.1)
Sodium: 136 mEq/L (ref 136–145)
TOTAL PROTEIN: 6.9 g/dL (ref 6.4–8.3)
Total Bilirubin: 0.89 mg/dL (ref 0.20–1.20)

## 2014-01-27 LAB — FERRITIN CHCC: Ferritin: 7 ng/ml — ABNORMAL LOW (ref 9–269)

## 2014-01-27 LAB — IRON AND TIBC CHCC
%SAT: 8 % — ABNORMAL LOW (ref 21–57)
Iron: 32 ug/dL — ABNORMAL LOW (ref 41–142)
TIBC: 383 ug/dL (ref 236–444)
UIBC: 350 ug/dL (ref 120–384)

## 2014-01-27 NOTE — Telephone Encounter (Signed)
gave pt appt for lab and MD for june 2015, emailed michelle regarding IV iron next week

## 2014-01-27 NOTE — Telephone Encounter (Signed)
Per staff message and POF I have scheduled appts.  JMW  

## 2014-01-27 NOTE — Progress Notes (Signed)
**Note [name]-Identified via Obfuscation** Hematology and Oncology Follow Up Visit  [name] [id number] [birth year] 48 y.o. 01/27/2014 11:11 AM [name] [name], MDPlotnikov, [name], MD   Principle Diagnosis: 48 year old woman diagnosed with iron deficiency anemia after she presented in July of 2014 with a hemoglobin of 8.4. Her iron studies showed iron level of 25 and ferritin of 5.   Prior Therapy: She is status post IV iron in the form of Feraheme given on 06/22/2013.  Current therapy: Observation and followup.  Interim History: This is a pleasant 48 year old woman presents today for routine followup. She reports her fatigue has worsened. She is not reporting any symptoms of chest pain or shortness of breath. Has not reported any more hematochezia or melena. She does report menorrhagia that has not completely subsided. She did not report any complications from the IV iron given in 06/2013. Reports increased craving for ice.Continues to work full-time.  Medications: I have reviewed the patient's current medications.  Current Outpatient Prescriptions  Medication Sig Dispense Refill  . ALPRAZolam (XANAX) 0.5 MG tablet Take 1 tablet (0.5 mg total) by mouth 2 (two) times daily as needed.  60 tablet  1  . amoxicillin-clavulanate (AUGMENTIN) 875-125 MG per tablet Take 1 tablet by mouth 2 (two) times daily.  20 tablet  1  . atorvastatin (LIPITOR) 10 MG tablet Take 1 tablet (10 mg total) by mouth daily.  90 tablet  3  . buPROPion (WELLBUTRIN SR) 150 MG 12 hr tablet Take 1 tablet (150 mg total) by mouth 2 (two) times daily.  180 tablet  3  . Cholecalciferol (EQL VITAMIN D3) 1000 UNITS tablet Take 1,000 Units by mouth daily.        . citalopram (CELEXA) 20 MG tablet Take 1 tablet (20 mg total) by mouth daily.  90 tablet  3  . cyclobenzaprine (FLEXERIL) 5 MG tablet 1 tablet at bedtime  30 tablet  5  . Fluticasone Furoate-Vilanterol (BREO ELLIPTA) 100-25 MCG/INH AEPB Inhale 1 Act into the lungs daily.  1 each  5  . Insulin Pen Needle (BD  PEN NEEDLE NANO U/F) 32G X 4 MM MISC USE 1 NEEDLE DAILY AS DIRECTED  50 each  3  . Iron Polysacch Cmplx-B12-FA (FERREX 150 FORTE) 150-0.025-1 MG CAPS Take 1 tablet by mouth daily.  30 each  11  . metFORMIN (GLUCOPHAGE) 500 MG tablet Take 2 tablets (1,000 mg total) by mouth 2 (two) times daily with a meal.  360 tablet  3  . NIFEdipine (PROCARDIA XL/ADALAT-CC) 90 MG 24 hr tablet TAKE 1 TABLET DAILY  90 tablet  2  . omeprazole (PRILOSEC) 40 MG capsule TAKE 1 CAPSULE DAILY  90 capsule  2  . promethazine-codeine (PHENERGAN WITH CODEINE) 6.25-10 MG/5ML syrup Take 5 mLs by mouth every 4 (four) hours as needed for cough.  300 mL  0  . VICTOZA 18 MG/3ML SOPN INJECT 1.8 MG UNDER THE SKIN DAILY  3 mL  2   No current facility-administered medications for this visit.     Allergies: No Known Allergies  Past Medical History, Surgical history, Social history, and Family History were reviewed and updated.  Review of Systems:  Remaining ROS negative. Physical Exam: Blood pressure 143/85, pulse 103, temperature 97 F (36.1 C), temperature source Oral, resp. rate 18, height 5\' 8"  (1.727 m), weight 241 lb 6.4 oz (109.498 kg), SpO2 99.00%. ECOG: 0 General appearance: alert, cooperative and appears stated age Head: Normocephalic, without obvious abnormality, atraumatic Neck: no adenopathy, no carotid bruit, no JVD, supple, symmetrical,  trachea midline and thyroid not enlarged, symmetric, no tenderness/mass/nodules Lymph nodes: Cervical, supraclavicular, and axillary nodes normal. Heart:regular rate and rhythm, S1, S2 normal, no murmur, click, rub or gallop Lung:chest clear, no wheezing, rales, normal symmetric air entry Abdomen: soft, non-tender, without masses or organomegaly EXT:no erythema, induration, or nodules   Lab Results: Lab Results  Component Value Date   WBC 9.7 01/27/2014   HGB 11.1* 01/27/2014   HCT 34.9 01/27/2014   MCV 75.2* 01/27/2014   PLT 239 01/27/2014     Chemistry      Component  Value Date/Time   NA 136 01/27/2014 0825   NA 135 05/03/2013 1549   K 4.0 01/27/2014 0825   K 3.3* 05/03/2013 1549   CL 103 05/03/2013 1549   CO2 24 01/27/2014 0825   CO2 24 05/03/2013 1549   BUN 5.6* 01/27/2014 0825   BUN 9 05/03/2013 1549   CREATININE 0.7 01/27/2014 0825   CREATININE 0.6 05/03/2013 1549      Component Value Date/Time   CALCIUM 9.2 01/27/2014 0825   CALCIUM 8.1* 05/03/2013 1549   ALKPHOS 95 01/27/2014 0825   ALKPHOS 81 02/28/2013 1215   AST 25 01/27/2014 0825   AST 30 02/28/2013 1215   ALT 28 01/27/2014 0825   ALT 34 02/28/2013 1215   BILITOT 0.89 01/27/2014 0825   BILITOT 0.8 02/28/2013 1215        Impression and Plan:  48 year old woman with the following issues:  1. Microcytic hypochromic anemia with documented findings of iron deficiency anemia. She is status post Feraheme infusion on 06/22/2013. This was well-tolerated and her hemoglobin had normalized following administration. Her Hgb is drifting down and her MCV is low. Recommend that we give her Feraheme 1,020 mg IV. She is agreeable to proceeding.   2. Heavy menstrual cycles: She will followup with her gynecologist regarding that as that could be the reason for her iron deficiency anemia.  3. Follow-up: In 4 months   [name] [name] 2/6/201511:11 AM

## 2014-01-30 ENCOUNTER — Telehealth: Payer: Self-pay | Admitting: Oncology

## 2014-01-30 NOTE — Telephone Encounter (Signed)
Called pt and left message regarding IV Iron this week

## 2014-02-02 ENCOUNTER — Telehealth: Payer: Self-pay | Admitting: Oncology

## 2014-02-02 NOTE — Telephone Encounter (Signed)
, °

## 2014-02-03 ENCOUNTER — Ambulatory Visit (HOSPITAL_BASED_OUTPATIENT_CLINIC_OR_DEPARTMENT_OTHER): Payer: BC Managed Care – PPO

## 2014-02-03 VITALS — BP 136/71 | HR 100 | Temp 97.8°F

## 2014-02-03 DIAGNOSIS — D509 Iron deficiency anemia, unspecified: Secondary | ICD-10-CM

## 2014-02-03 MED ORDER — SODIUM CHLORIDE 0.9 % IV SOLN
1020.0000 mg | Freq: Once | INTRAVENOUS | Status: AC
  Administered 2014-02-03: 1020 mg via INTRAVENOUS
  Filled 2014-02-03: qty 34

## 2014-02-03 NOTE — Patient Instructions (Signed)

## 2014-02-25 ENCOUNTER — Other Ambulatory Visit: Payer: Self-pay | Admitting: Internal Medicine

## 2014-02-28 ENCOUNTER — Encounter: Payer: Self-pay | Admitting: Internal Medicine

## 2014-02-28 ENCOUNTER — Ambulatory Visit (INDEPENDENT_AMBULATORY_CARE_PROVIDER_SITE_OTHER): Payer: BC Managed Care – PPO | Admitting: Internal Medicine

## 2014-02-28 VITALS — BP 112/70 | HR 88 | Temp 97.9°F | Resp 16 | Wt 245.0 lb

## 2014-02-28 DIAGNOSIS — M25529 Pain in unspecified elbow: Secondary | ICD-10-CM

## 2014-02-28 DIAGNOSIS — Z800 Family history of malignant neoplasm of digestive organs: Secondary | ICD-10-CM

## 2014-02-28 DIAGNOSIS — M25521 Pain in right elbow: Secondary | ICD-10-CM | POA: Insufficient documentation

## 2014-02-28 DIAGNOSIS — M79604 Pain in right leg: Secondary | ICD-10-CM | POA: Insufficient documentation

## 2014-02-28 DIAGNOSIS — E119 Type 2 diabetes mellitus without complications: Secondary | ICD-10-CM

## 2014-02-28 DIAGNOSIS — I509 Heart failure, unspecified: Secondary | ICD-10-CM

## 2014-02-28 DIAGNOSIS — M79609 Pain in unspecified limb: Secondary | ICD-10-CM

## 2014-02-28 MED ORDER — MELOXICAM 7.5 MG PO TABS: 7.5000 mg | ORAL_TABLET | Freq: Every day | ORAL | Status: DC

## 2014-02-28 MED ORDER — CYCLOBENZAPRINE HCL 5 MG PO TABS: ORAL_TABLET | ORAL | Status: DC

## 2014-02-28 MED ORDER — ALPRAZOLAM 0.5 MG PO TABS: 0.5000 mg | ORAL_TABLET | Freq: Two times a day (BID) | ORAL | Status: DC | PRN

## 2014-02-28 MED ORDER — TRAMADOL HCL 50 MG PO TABS: 50.0000 mg | ORAL_TABLET | Freq: Two times a day (BID) | ORAL | Status: DC | PRN

## 2014-02-28 MED ORDER — METHYLPREDNISOLONE ACETATE 20 MG/ML IJ SUSP: 20.0000 mg | Freq: Once | INTRAMUSCULAR | Status: DC

## 2014-02-28 NOTE — Assessment & Plan Note (Signed)
Will inject Tramadol ACE Meloxicam - low dose

## 2014-02-28 NOTE — Assessment & Plan Note (Signed)
NSAIDS low dose w/caution

## 2014-02-28 NOTE — Assessment & Plan Note (Signed)
3/15 ?etiol - poss MS[facility] vs other Ven Doppler Meloxicam w/caution

## 2014-02-28 NOTE — Progress Notes (Signed)
Subjective:    HPI  C/o R elbow pain C/o R thigh and knee pain First cousin - colon ca  F/u fatigue and SOB  Not better  The patient needs to address  chronic hypertension that has been well controlled with medicines; to address h/o CHF controlled with medicines as well; and to address type 2 chronic diabetes, controlled with medical treatment and diet.  BP Readings from Last 3 Encounters:  02/28/14 112/70  02/03/14 136/71  01/27/14 143/85   Wt Readings from Last 3 Encounters:  02/28/14 245 lb (111.131 kg)  01/27/14 241 lb 6.4 oz (109.498 kg)  11/09/13 243 lb (110.224 kg)      Review of Systems  Constitutional: Positive for fatigue. Negative for fever, chills, diaphoresis, activity change, appetite change and unexpected weight change.  HENT: Negative for congestion, dental problem, ear pain, hearing loss, mouth sores, postnasal drip, sinus pressure, sneezing, sore throat and voice change.   Eyes: Negative for pain and visual disturbance.  Respiratory: Negative for cough, chest tightness, wheezing and stridor.   Cardiovascular: Negative for chest pain, palpitations and leg swelling.  Gastrointestinal: Negative for nausea, vomiting, abdominal pain, blood in stool, abdominal distention and rectal pain.  Genitourinary: Positive for vaginal bleeding and menstrual problem. Negative for dysuria, hematuria, decreased urine volume, vaginal discharge, difficulty urinating and vaginal pain.  Musculoskeletal: Negative for back pain, gait problem, joint swelling and neck pain.  Skin: Negative for color change, rash and wound.  Neurological: Negative for dizziness, tremors, syncope, speech difficulty and light-headedness.  Hematological: Negative for adenopathy.  Psychiatric/Behavioral: Negative for suicidal ideas, hallucinations, behavioral problems, confusion, sleep disturbance, dysphoric mood and decreased concentration. The patient is not hyperactive.   R lat epic is tender R post  dist thigh is tender     Objective:   Physical Exam  Constitutional: She appears well-developed. No distress.  Obese   HENT:  Head: Normocephalic.  Right Ear: External ear normal.  Left Ear: External ear normal.  Nose: Nose normal.  Mouth/Throat: Oropharynx is clear and moist.  Eyes: Conjunctivae are normal. Pupils are equal, round, and reactive to light. Right eye exhibits no discharge. Left eye exhibits no discharge.  Neck: Normal range of motion. Neck supple. No JVD present. No tracheal deviation present. No thyromegaly present.  Cardiovascular: Normal rate, regular rhythm and normal heart sounds.   Pulmonary/Chest: No stridor. No respiratory distress. She has no wheezes.  Abdominal: Soft. Bowel sounds are normal. She exhibits no distension and no mass. There is no tenderness. There is no rebound and no guarding.  Musculoskeletal: She exhibits no edema and no tenderness.  Lymphadenopathy:    She has no cervical adenopathy.  Neurological: She displays normal reflexes. No cranial nerve deficit. She exhibits normal muscle tone. Coordination normal.  Skin: No rash noted. No erythema.  pale  Psychiatric: She has a normal mood and affect. Her behavior is normal. Judgment and thought content normal.  R lat epic is tender R post dist thigh is tender    Lab Results  Component Value Date   WBC 9.7 01/27/2014   HGB 11.1* 01/27/2014   HCT 34.9 01/27/2014   PLT 239 01/27/2014   GLUCOSE 240* 01/27/2014   CHOL 170 02/28/2013   TRIG 282.0* 02/28/2013   HDL 30.50* 02/28/2013   LDLDIRECT 101.4 02/28/2013   ALT 28 01/27/2014   AST 25 01/27/2014   NA 136 01/27/2014   K 4.0 01/27/2014   CL 103 05/03/2013   CREATININE 0.7 01/27/2014  BUN 5.6* 01/27/2014   CO2 24 01/27/2014   TSH 1.81 02/28/2013   HGBA1C 7.5* 02/28/2013    Procedure: tennis elbow steroid injection  Indication: R elbow lateral epicondylitis  Risks including bleeding, infection, unsuccessful procedure and others were explained to the patient in  detail. The pt agreed to proceed. The patient was placed in the decubitus position. The area was prepped with betadine and alcohol. The injection was carried out with a mixture of 20 mg of Depomedrol and 2 cc of Lidocaine 2%. Band-Aid applied.  Tolerated well. Complications - none. Post-procedure instructions were provided.       Assessment & Plan:

## 2014-02-28 NOTE — Assessment & Plan Note (Signed)
First cousin w/colon cancer 46 yo

## 2014-02-28 NOTE — Progress Notes (Signed)
Pre visit review using our clinic review tool, if applicable. No additional management support is needed unless otherwise documented below in the visit note. 

## 2014-02-28 NOTE — Assessment & Plan Note (Signed)
Continue with current prescription therapy as reflected on the Med list.  

## 2014-03-03 ENCOUNTER — Telehealth: Payer: Self-pay

## 2014-03-03 NOTE — Telephone Encounter (Signed)
Relevant patient education assigned to patient using Emmi. ° °

## 2014-03-07 ENCOUNTER — Encounter (HOSPITAL_COMMUNITY): Payer: BC Managed Care – PPO

## 2014-03-13 ENCOUNTER — Encounter (HOSPITAL_COMMUNITY): Payer: BC Managed Care – PPO

## 2014-03-21 ENCOUNTER — Ambulatory Visit (HOSPITAL_COMMUNITY): Payer: BC Managed Care – PPO | Attending: Cardiology | Admitting: Cardiology

## 2014-03-21 ENCOUNTER — Encounter: Payer: Self-pay | Admitting: Cardiology

## 2014-03-21 DIAGNOSIS — M79609 Pain in unspecified limb: Secondary | ICD-10-CM | POA: Insufficient documentation

## 2014-03-21 DIAGNOSIS — M79604 Pain in right leg: Secondary | ICD-10-CM

## 2014-03-21 NOTE — Progress Notes (Signed)
Right lower extremity venous duplex completed

## 2014-04-03 ENCOUNTER — Encounter: Payer: Self-pay | Admitting: Internal Medicine

## 2014-04-13 ENCOUNTER — Other Ambulatory Visit: Payer: Self-pay | Admitting: Internal Medicine

## 2014-04-25 ENCOUNTER — Other Ambulatory Visit: Payer: Self-pay | Admitting: Internal Medicine

## 2014-05-26 ENCOUNTER — Ambulatory Visit (HOSPITAL_BASED_OUTPATIENT_CLINIC_OR_DEPARTMENT_OTHER): Payer: BC Managed Care – PPO | Admitting: Oncology

## 2014-05-26 ENCOUNTER — Encounter: Payer: Self-pay | Admitting: Oncology

## 2014-05-26 ENCOUNTER — Other Ambulatory Visit (HOSPITAL_BASED_OUTPATIENT_CLINIC_OR_DEPARTMENT_OTHER): Payer: BC Managed Care – PPO

## 2014-05-26 VITALS — BP 147/86 | HR 96 | Temp 98.1°F | Resp 18 | Ht 68.0 in | Wt 241.7 lb

## 2014-05-26 DIAGNOSIS — D509 Iron deficiency anemia, unspecified: Secondary | ICD-10-CM

## 2014-05-26 DIAGNOSIS — N920 Excessive and frequent menstruation with regular cycle: Secondary | ICD-10-CM

## 2014-05-26 LAB — CBC WITH DIFFERENTIAL/PLATELET
BASO%: 1 % (ref 0.0–2.0)
Basophils Absolute: 0.1 10*3/uL (ref 0.0–0.1)
EOS%: 2.4 % (ref 0.0–7.0)
Eosinophils Absolute: 0.2 10*3/uL (ref 0.0–0.5)
HCT: 38.1 % (ref 34.8–46.6)
HGB: 12.7 g/dL (ref 11.6–15.9)
LYMPH%: 25.7 % (ref 14.0–49.7)
MCH: 28.3 pg (ref 25.1–34.0)
MCHC: 33.4 g/dL (ref 31.5–36.0)
MCV: 84.7 fL (ref 79.5–101.0)
MONO#: 0.3 10*3/uL (ref 0.1–0.9)
MONO%: 4.4 % (ref 0.0–14.0)
NEUT#: 5 10*3/uL (ref 1.5–6.5)
NEUT%: 66.5 % (ref 38.4–76.8)
Platelets: 175 10*3/uL (ref 145–400)
RBC: 4.5 10*6/uL (ref 3.70–5.45)
RDW: 14.5 % (ref 11.2–14.5)
WBC: 7.5 10*3/uL (ref 3.9–10.3)
lymph#: 1.9 10*3/uL (ref 0.9–3.3)

## 2014-05-26 LAB — IRON AND TIBC CHCC
%SAT: 21 % (ref 21–57)
IRON: 62 ug/dL (ref 41–142)
TIBC: 296 ug/dL (ref 236–444)
UIBC: 234 ug/dL (ref 120–384)

## 2014-05-26 LAB — FERRITIN CHCC: Ferritin: 21 ng/ml (ref 9–269)

## 2014-05-26 NOTE — Progress Notes (Signed)
Hematology and Oncology Follow Up Visit  [name] [name] [id number] 1966/03/16 48 y.o. 05/26/2014 9:50 AM [name], MDPlotnikov, [name], MD   Principle Diagnosis: 48 year old woman diagnosed with iron deficiency anemia after she presented in July of 2014 with a hemoglobin of 8.4. Her iron studies showed iron level of 25 and ferritin of 5.   Prior Therapy: She is status post IV iron in the form of Feraheme given on 06/22/2013 and repeated in 02/03/2014.   Current therapy: Observation and followup.  Interim History: [name] [name] presents today for routine followup. Since the last visit, she is reporting significant improvement in her symptoms of fatigue and tiredness. She tolerated the IV iron infusion in February without any complications. She is reporting more energy and ability to perform activities of daily living without any decline. She continues to have menstrual cycles but they're not necessarily heavy She is not reporting any symptoms of chest pain or shortness of breath. Has not reported any more hematochezia or melena. She continues to work full-time without any decline. She has not reported any headaches or blurred vision or double vision. She does not report any syncope or seizures. Does not report any nausea or vomiting or abdominal pain. She does not report any neuropathy. She does not report any hemoptysis or hematemesis. She does not report any genitourinary bleeding. He does not report any skeletal pain. Rest of her review of systems unremarkable.  Medications: I have reviewed the patient's current medications.  Current Outpatient Prescriptions  Medication Sig Dispense Refill  . ALPRAZolam (XANAX) 0.5 MG tablet Take 1 tablet (0.5 mg total) by mouth 2 (two) times daily as needed.  60 tablet  1  . atorvastatin (LIPITOR) 10 MG tablet TAKE 1 TABLET DAILY  90 tablet  2  . buPROPion (WELLBUTRIN SR) 150 MG 12 hr tablet TAKE 1 TABLET 2 TIMES DAILY  180 tablet  2  . Cholecalciferol  (EQL VITAMIN D3) 1000 UNITS tablet Take 1,000 Units by mouth daily.        . citalopram (CELEXA) 20 MG tablet TAKE 1 TABLET DAILY  90 tablet  2  . cyclobenzaprine (FLEXERIL) 5 MG tablet 1 tablet at bedtime  30 tablet  5  . Fluticasone Furoate-Vilanterol (BREO ELLIPTA) 100-25 MCG/INH AEPB Inhale 1 Act into the lungs daily.  1 each  5  . Insulin Pen Needle (BD PEN NEEDLE NANO U/F) 32G X 4 MM MISC USE 1 NEEDLE DAILY AS DIRECTED  50 each  3  . meloxicam (MOBIC) 7.5 MG tablet Take 1 tablet (7.5 mg total) by mouth daily.  30 tablet  0  . metFORMIN (GLUCOPHAGE) 500 MG tablet TAKE 2 TABLETS (1,000 MG TOTAL) TWO TIMES DAILY WITH A MEAL  360 tablet  2  . NIFEdipine (PROCARDIA XL/ADALAT-CC) 90 MG 24 hr tablet TAKE 1 TABLET DAILY  90 tablet  1  . omeprazole (PRILOSEC) 40 MG capsule TAKE 1 CAPSULE DAILY  90 capsule  2  . traMADol (ULTRAM) 50 MG tablet Take 1-2 tablets (50-100 mg total) by mouth 2 (two) times daily as needed.  60 tablet  0  . VICTOZA 18 MG/3ML SOPN INJECT 1.8 MG UNDER THE SKIN DAILY  3 mL  2   Current Facility-Administered Medications  Medication Dose Route Frequency Provider Last Rate Last Dose  . methylPREDNISolone acetate (DEPO-MEDROL) 20 MG/ML injection 20 mg  20 mg Intra-articular Once Lew Dawes V, MD         Allergies: No Known Allergies  Past Medical  History, Surgical history, Social history, and Family History were reviewed and updated.   Physical Exam: Blood pressure 147/86, pulse 96, temperature 98.1 F (36.7 C), temperature source Oral, resp. rate 18, height 5\' 8"  (1.727 m), weight 241 lb 11.2 oz (109.634 kg). ECOG: 0 General appearance: alert and cooperative Head: Normocephalic, without obvious abnormality Neck: no adenopathy Lymph nodes: Cervical, supraclavicular, and axillary nodes normal. Heart:regular rate and rhythm, S1, S2 normal, no murmur, click, rub or gallop Lung:chest clear, no wheezing, no shifting dullness. Abdomen: soft, non-tender, no ascites or  shifting dullness EXT:no erythema, induration, or nodules no edema.   Lab Results: Lab Results  Component Value Date   WBC 7.5 05/26/2014   HGB 12.7 05/26/2014   HCT 38.1 05/26/2014   MCV 84.7 05/26/2014   PLT 175 05/26/2014     Chemistry      Component Value Date/Time   NA 136 01/27/2014 0825   NA 135 05/03/2013 1549   K 4.0 01/27/2014 0825   K 3.3* 05/03/2013 1549   CL 103 05/03/2013 1549   CO2 24 01/27/2014 0825   CO2 24 05/03/2013 1549   BUN 5.6* 01/27/2014 0825   BUN 9 05/03/2013 1549   CREATININE 0.7 01/27/2014 0825   CREATININE 0.6 05/03/2013 1549      Component Value Date/Time   CALCIUM 9.2 01/27/2014 0825   CALCIUM 8.1* 05/03/2013 1549   ALKPHOS 95 01/27/2014 0825   ALKPHOS 81 02/28/2013 1215   AST 25 01/27/2014 0825   AST 30 02/28/2013 1215   ALT 28 01/27/2014 0825   ALT 34 02/28/2013 1215   BILITOT 0.89 01/27/2014 0825   BILITOT 0.8 02/28/2013 1215        Impression and Plan:  48 year old woman with the following issues:  1. Microcytic hypochromic anemia with documented findings of iron deficiency anemia. She is status post Feraheme infusion on 02/03/2014. She tolerated it very well and her hemoglobin is back to normal now. She is asymptomatic and will likely not need any IV iron this time. We will check her counts in about 4 months and assess whether she needs IV iron at that time.   2. Heavy menstrual cycles: She will followup with her gynecologist regarding that. I see no clear-cut reason to recommend a hysterectomy. But if her iron deficiency anemia becomes long-lasting problem, we can discuss that further.  3. Follow-up: In 4 months   [name] [name] 6/5/20159:50 AM

## 2014-05-29 ENCOUNTER — Other Ambulatory Visit: Payer: Self-pay | Admitting: Internal Medicine

## 2014-05-29 ENCOUNTER — Telehealth: Payer: Self-pay | Admitting: Oncology

## 2014-05-29 NOTE — Telephone Encounter (Signed)
s.w. pt and advised on Oct appt...pt ok adn aware °

## 2014-06-21 ENCOUNTER — Telehealth: Payer: Self-pay | Admitting: *Deleted

## 2014-06-21 DIAGNOSIS — E119 Type 2 diabetes mellitus without complications: Secondary | ICD-10-CM

## 2014-06-21 NOTE — Telephone Encounter (Signed)
Left message on machine for patient to go to the lab. Lipid, a1c, bmet ordered. Message will be sent to mychart.

## 2014-08-09 ENCOUNTER — Other Ambulatory Visit: Payer: Self-pay | Admitting: Internal Medicine

## 2014-09-04 ENCOUNTER — Other Ambulatory Visit: Payer: Self-pay | Admitting: Internal Medicine

## 2014-09-29 ENCOUNTER — Telehealth: Payer: Self-pay | Admitting: Oncology

## 2014-09-29 ENCOUNTER — Other Ambulatory Visit (HOSPITAL_BASED_OUTPATIENT_CLINIC_OR_DEPARTMENT_OTHER): Payer: BC Managed Care – PPO

## 2014-09-29 ENCOUNTER — Encounter: Payer: Self-pay | Admitting: Physician Assistant

## 2014-09-29 ENCOUNTER — Other Ambulatory Visit (INDEPENDENT_AMBULATORY_CARE_PROVIDER_SITE_OTHER): Payer: BC Managed Care – PPO

## 2014-09-29 ENCOUNTER — Ambulatory Visit (HOSPITAL_BASED_OUTPATIENT_CLINIC_OR_DEPARTMENT_OTHER): Payer: BC Managed Care – PPO | Admitting: Physician Assistant

## 2014-09-29 VITALS — BP 134/76 | HR 100 | Temp 98.2°F | Resp 18 | Ht 68.0 in | Wt 240.5 lb

## 2014-09-29 DIAGNOSIS — E119 Type 2 diabetes mellitus without complications: Secondary | ICD-10-CM

## 2014-09-29 DIAGNOSIS — D509 Iron deficiency anemia, unspecified: Secondary | ICD-10-CM

## 2014-09-29 LAB — COMPREHENSIVE METABOLIC PANEL (CC13)
ALBUMIN: 3.5 g/dL (ref 3.5–5.0)
ALT: 24 U/L (ref 0–55)
AST: 17 U/L (ref 5–34)
Alkaline Phosphatase: 110 U/L (ref 40–150)
Anion Gap: 7 mEq/L (ref 3–11)
BUN: 6.3 mg/dL — ABNORMAL LOW (ref 7.0–26.0)
CO2: 26 mEq/L (ref 22–29)
Calcium: 9.5 mg/dL (ref 8.4–10.4)
Chloride: 104 mEq/L (ref 98–109)
Creatinine: 0.7 mg/dL (ref 0.6–1.1)
GLUCOSE: 257 mg/dL — AB (ref 70–140)
POTASSIUM: 4.5 meq/L (ref 3.5–5.1)
SODIUM: 137 meq/L (ref 136–145)
TOTAL PROTEIN: 7 g/dL (ref 6.4–8.3)
Total Bilirubin: 0.87 mg/dL (ref 0.20–1.20)

## 2014-09-29 LAB — CBC WITH DIFFERENTIAL/PLATELET
BASO%: 0.5 % (ref 0.0–2.0)
Basophils Absolute: 0 10*3/uL (ref 0.0–0.1)
EOS ABS: 0.1 10*3/uL (ref 0.0–0.5)
EOS%: 1.6 % (ref 0.0–7.0)
HCT: 39.7 % (ref 34.8–46.6)
HGB: 12.8 g/dL (ref 11.6–15.9)
LYMPH%: 32 % (ref 14.0–49.7)
MCH: 27.4 pg (ref 25.1–34.0)
MCHC: 32.2 g/dL (ref 31.5–36.0)
MCV: 84.8 fL (ref 79.5–101.0)
MONO#: 0.3 10*3/uL (ref 0.1–0.9)
MONO%: 4.5 % (ref 0.0–14.0)
NEUT#: 4.5 10*3/uL (ref 1.5–6.5)
NEUT%: 61.4 % (ref 38.4–76.8)
PLATELETS: 183 10*3/uL (ref 145–400)
RBC: 4.68 10*6/uL (ref 3.70–5.45)
RDW: 14.1 % (ref 11.2–14.5)
WBC: 7.3 10*3/uL (ref 3.9–10.3)
lymph#: 2.3 10*3/uL (ref 0.9–3.3)

## 2014-09-29 LAB — HEMOGLOBIN A1C: Hgb A1c MFr Bld: 8.2 % — ABNORMAL HIGH (ref 4.6–6.5)

## 2014-09-29 LAB — IRON AND TIBC CHCC
%SAT: 17 % — AB (ref 21–57)
Iron: 58 ug/dL (ref 41–142)
TIBC: 333 ug/dL (ref 236–444)
UIBC: 275 ug/dL (ref 120–384)

## 2014-09-29 LAB — BASIC METABOLIC PANEL
BUN: 7 mg/dL (ref 6–23)
CHLORIDE: 101 meq/L (ref 96–112)
CO2: 26 meq/L (ref 19–32)
CREATININE: 0.4 mg/dL (ref 0.4–1.2)
Calcium: 9.3 mg/dL (ref 8.4–10.5)
GFR: 163.78 mL/min (ref >60.00)
Glucose, Bld: 221 mg/dL — ABNORMAL HIGH (ref 70–99)
POTASSIUM: 4.2 meq/L (ref 3.5–5.1)
Sodium: 133 mEq/L — ABNORMAL LOW (ref 135–145)

## 2014-09-29 LAB — LIPID PANEL
CHOL/HDL RATIO: 6
Cholesterol: 169 mg/dL (ref 0–200)
HDL: 28 mg/dL — ABNORMAL LOW (ref >39.00)
NonHDL: 141
Triglycerides: 485 mg/dL — ABNORMAL HIGH (ref 0.0–149.0)
VLDL: 97 mg/dL — AB (ref 0.0–40.0)

## 2014-09-29 LAB — FERRITIN CHCC: Ferritin: 16 ng/ml (ref 9–269)

## 2014-09-29 LAB — LDL CHOLESTEROL, DIRECT: Direct LDL: 72.3 mg/dL

## 2014-09-29 NOTE — Telephone Encounter (Signed)
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

## 2014-09-29 NOTE — Progress Notes (Signed)
Hematology and Oncology Follow Up Visit  ERISA MEHLMAN [id number] 02-08-1966 48 y.o. 09/29/2014 3:28 PM [name], MDPlotnikov, [name], MD   Principle Diagnosis: 48 year old woman diagnosed with iron deficiency anemia after she presented in July of 2014 with a hemoglobin of 8.4. Her iron studies showed iron level of 25 and ferritin of 5.   Prior Therapy: She is status post IV iron in the form of Feraheme given on 06/22/2013 and repeated in 02/03/2014.   Current therapy: Observation and followup.  Interim History: Ms. Bushong presents today for routine followup. Since the last visit, she is reporting significant improvement in her symptoms of fatigue and tiredness. She tolerated the IV iron infusion in February without any complications. She is reporting more energy and ability to perform activities of daily living without any decline. She continues to have menstrual cycles but they are more sporadic and not as heavy.  She is not reporting any symptoms of chest pain or shortness of breath. Has not reported any more hematochezia or melena. She continues to work full-time without any decline. She has not reported any headaches or blurred vision or double vision. She does not report any syncope or seizures. Does not report any nausea or vomiting or abdominal pain. She does not report any neuropathy. She does not report any hemoptysis or hematemesis. She does not report any genitourinary bleeding. He does not report any skeletal pain. Rest of her review of systems unremarkable.  Medications: I have reviewed the patient's current medications.  Current Outpatient Prescriptions  Medication Sig Dispense Refill  . ALPRAZolam (XANAX) 0.5 MG tablet Take 1 tablet (0.5 mg total) by mouth 2 (two) times daily as needed.  60 tablet  1  . atorvastatin (LIPITOR) 10 MG tablet TAKE 1 TABLET DAILY  90 tablet  2  . buPROPion (WELLBUTRIN SR) 150 MG 12 hr tablet TAKE 1 TABLET 2 TIMES DAILY  180 tablet  2  .  Cholecalciferol (EQL VITAMIN D3) 1000 UNITS tablet Take 1,000 Units by mouth daily.        . citalopram (CELEXA) 20 MG tablet TAKE 1 TABLET DAILY  90 tablet  2  . cyclobenzaprine (FLEXERIL) 5 MG tablet 1 tablet at bedtime  30 tablet  5  . Fluticasone Furoate-Vilanterol (BREO ELLIPTA) 100-25 MCG/INH AEPB Inhale 1 Act into the lungs daily.  1 each  5  . Insulin Pen Needle (BD PEN NEEDLE NANO U/F) 32G X 4 MM MISC USE 1 NEEDLE DAILY AS DIRECTED  50 each  3  . meloxicam (MOBIC) 7.5 MG tablet Take 1 tablet (7.5 mg total) by mouth daily.  30 tablet  0  . metFORMIN (GLUCOPHAGE) 500 MG tablet TAKE 2 TABLETS (1,000 MG TOTAL) TWO TIMES DAILY WITH A MEAL  360 tablet  2  . NIFEdipine (PROCARDIA XL/ADALAT-CC) 90 MG 24 hr tablet TAKE 1 TABLET DAILY  90 tablet  0  . omeprazole (PRILOSEC) 40 MG capsule TAKE 1 CAPSULE DAILY  90 capsule  2  . traMADol (ULTRAM) 50 MG tablet Take 1-2 tablets (50-100 mg total) by mouth 2 (two) times daily as needed.  60 tablet  0  . VICTOZA 18 MG/3ML SOPN INJECT 1.8 MG UNDER THE SKIN DAILY  3 mL  2   Current Facility-Administered Medications  Medication Dose Route Frequency Provider Last Rate Last Dose  . methylPREDNISolone acetate (DEPO-MEDROL) 20 MG/ML injection 20 mg  20 mg Intra-articular Once Lew Dawes V, MD         Allergies: No  Known Allergies  Past Medical History, Surgical history, Social history, and Family History were reviewed and updated.   Physical Exam: Blood pressure 134/76, pulse 100, temperature 98.2 F (36.8 C), temperature source Oral, resp. rate 18, height 5\' 8"  (1.727 m), weight 240 lb 8 oz (109.09 kg), SpO2 98.00%. ECOG: 0 General appearance: alert and cooperative Head: Normocephalic, without obvious abnormality Neck: no adenopathy Lymph nodes: Cervical, supraclavicular, and axillary nodes normal. Heart:regular rate and rhythm, S1, S2 normal, no murmur, click, rub or gallop Lung:chest clear, no wheezing, no shifting dullness. Abdomen: soft,  non-tender, no ascites or shifting dullness EXT:no erythema, induration, or nodules no edema.   Lab Results: Lab Results  Component Value Date   WBC 7.3 09/29/2014   HGB 12.8 09/29/2014   HCT 39.7 09/29/2014   MCV 84.8 09/29/2014   PLT 183 09/29/2014     Chemistry      Component Value Date/Time   NA 137 09/29/2014 0935   NA 133* 09/29/2014 0800   K 4.5 09/29/2014 0935   K 4.2 09/29/2014 0800   CL 101 09/29/2014 0800   CO2 26 09/29/2014 0935   CO2 26 09/29/2014 0800   BUN 6.3* 09/29/2014 0935   BUN 7 09/29/2014 0800   CREATININE 0.7 09/29/2014 0935   CREATININE 0.4 09/29/2014 0800      Component Value Date/Time   CALCIUM 9.5 09/29/2014 0935   CALCIUM 9.3 09/29/2014 0800   ALKPHOS 110 09/29/2014 0935   ALKPHOS 81 02/28/2013 1215   AST 17 09/29/2014 0935   AST 30 02/28/2013 1215   ALT 24 09/29/2014 0935   ALT 34 02/28/2013 1215   BILITOT 0.87 09/29/2014 0935   BILITOT 0.8 02/28/2013 1215        Impression and Plan:  48 year old woman with the following issues:  1. Microcytic hypochromic anemia with documented findings of iron deficiency anemia. She is status post Feraheme infusion on 02/03/2014. She tolerated it very well and her hemoglobin is back to normal now. She is asymptomatic and will likely not need any IV iron this time. Prior studies are pending from today. We will check her counts in about 4 months and assess whether she needs IV iron at that time.   2. Heavy menstrual cycles: She will followup with her gynecologist regarding that. No clear-cut reason to recommend a hysterectomy at this time. But if her iron deficiency anemia becomes long-lasting problem, we can discuss that further.  3. Follow-up: In 4 months   Ellary Casamento E, PA-C 10/9/20153:28 PM

## 2014-10-01 NOTE — Patient Instructions (Signed)
Follow up in 4 months 

## 2014-10-03 ENCOUNTER — Encounter: Payer: Self-pay | Admitting: Internal Medicine

## 2014-10-03 ENCOUNTER — Ambulatory Visit (INDEPENDENT_AMBULATORY_CARE_PROVIDER_SITE_OTHER): Payer: BC Managed Care – PPO | Admitting: Internal Medicine

## 2014-10-03 VITALS — BP 132/68 | HR 80 | Temp 98.5°F | Resp 16 | Wt 238.0 lb

## 2014-10-03 DIAGNOSIS — IMO0002 Reserved for concepts with insufficient information to code with codable children: Secondary | ICD-10-CM

## 2014-10-03 DIAGNOSIS — E1165 Type 2 diabetes mellitus with hyperglycemia: Secondary | ICD-10-CM

## 2014-10-03 DIAGNOSIS — J4520 Mild intermittent asthma, uncomplicated: Secondary | ICD-10-CM

## 2014-10-03 MED ORDER — CANAGLIFLOZIN 300 MG PO TABS: 300.0000 mg | ORAL_TABLET | Freq: Every day | ORAL | Status: DC

## 2014-10-03 NOTE — Assessment & Plan Note (Signed)
Added Invokana

## 2014-10-03 NOTE — Assessment & Plan Note (Signed)
Continue with current prescription therapy as reflected on the Med list.  

## 2014-10-03 NOTE — Progress Notes (Signed)
Pre visit review using our clinic review tool, if applicable. No additional management support is needed unless otherwise documented below in the visit note. 

## 2014-10-03 NOTE — Assessment & Plan Note (Signed)
Better  

## 2014-10-03 NOTE — Progress Notes (Signed)
Subjective:    HPI  C/o fatigue and insomnia  First cousin - colon ca  F/u fatigue and SOB  Not better  The patient needs to address  chronic hypertension that has been well controlled with medicines; to address h/o CHF controlled with medicines as well; and to address type 2 chronic diabetes, controlled with medical treatment and diet.  BP Readings from Last 3 Encounters:  10/03/14 132/68  09/29/14 134/76  05/26/14 147/86   Wt Readings from Last 3 Encounters:  10/03/14 238 lb (107.956 kg)  09/29/14 240 lb 8 oz (109.09 kg)  05/26/14 241 lb 11.2 oz (109.634 kg)      Review of Systems  Constitutional: Positive for fatigue. Negative for fever, chills, diaphoresis, activity change, appetite change and unexpected weight change.  HENT: Negative for congestion, dental problem, ear pain, hearing loss, mouth sores, postnasal drip, sinus pressure, sneezing, sore throat and voice change.   Eyes: Negative for pain and visual disturbance.  Respiratory: Negative for cough, chest tightness, wheezing and stridor.   Cardiovascular: Negative for chest pain, palpitations and leg swelling.  Gastrointestinal: Negative for nausea, vomiting, abdominal pain, blood in stool, abdominal distention and rectal pain.  Genitourinary: Positive for vaginal bleeding and menstrual problem. Negative for dysuria, hematuria, decreased urine volume, vaginal discharge, difficulty urinating and vaginal pain.  Musculoskeletal: Negative for back pain, gait problem, joint swelling and neck pain.  Skin: Negative for color change, rash and wound.  Neurological: Negative for dizziness, tremors, syncope, speech difficulty and light-headedness.  Hematological: Negative for adenopathy.  Psychiatric/Behavioral: Negative for suicidal ideas, hallucinations, behavioral problems, confusion, sleep disturbance, dysphoric mood and decreased concentration. The patient is not hyperactive.   R lat epic is tender R post dist thigh is  tender     Objective:   Physical Exam  Constitutional: She appears well-developed. No distress.  Obese   HENT:  Head: Normocephalic.  Right Ear: External ear normal.  Left Ear: External ear normal.  Nose: Nose normal.  Mouth/Throat: Oropharynx is clear and moist.  Eyes: Conjunctivae are normal. Pupils are equal, round, and reactive to light. Right eye exhibits no discharge. Left eye exhibits no discharge.  Neck: Normal range of motion. Neck supple. No JVD present. No tracheal deviation present. No thyromegaly present.  Cardiovascular: Normal rate, regular rhythm and normal heart sounds.   Pulmonary/Chest: No stridor. No respiratory distress. She has no wheezes.  Abdominal: Soft. Bowel sounds are normal. She exhibits no distension and no mass. There is no tenderness. There is no rebound and no guarding.  Musculoskeletal: She exhibits no edema and no tenderness.  Lymphadenopathy:    She has no cervical adenopathy.  Neurological: She displays normal reflexes. No cranial nerve deficit. She exhibits normal muscle tone. Coordination normal.  Skin: No rash noted. No erythema.  pale  Psychiatric: She has a normal mood and affect. Her behavior is normal. Judgment and thought content normal.  R lat epic is tender R post dist thigh is tender    Lab Results  Component Value Date   WBC 7.3 09/29/2014   HGB 12.8 09/29/2014   HCT 39.7 09/29/2014   PLT 183 09/29/2014   GLUCOSE 257* 09/29/2014   CHOL 169 09/29/2014   TRIG 485.0 Triglyceride is over 400; calculations on Lipids are invalid.* 09/29/2014   HDL 28.00* 09/29/2014   LDLDIRECT 72.3 09/29/2014   ALT 24 09/29/2014   AST 17 09/29/2014   NA 137 09/29/2014   K 4.5 09/29/2014   CL 101 09/29/2014  CREATININE 0.7 09/29/2014   BUN 6.3* 09/29/2014   CO2 26 09/29/2014   TSH 1.81 02/28/2013   HGBA1C 8.2* 09/29/2014           Assessment & Plan:

## 2014-10-04 ENCOUNTER — Telehealth: Payer: Self-pay | Admitting: Internal Medicine

## 2014-10-04 NOTE — Telephone Encounter (Signed)
[facility] EMAILED  °

## 2014-11-05 ENCOUNTER — Other Ambulatory Visit: Payer: Self-pay | Admitting: Internal Medicine

## 2014-11-15 ENCOUNTER — Other Ambulatory Visit: Payer: Self-pay | Admitting: Internal Medicine

## 2014-11-21 ENCOUNTER — Other Ambulatory Visit: Payer: Self-pay | Admitting: Internal Medicine

## 2014-12-15 ENCOUNTER — Other Ambulatory Visit: Payer: Self-pay | Admitting: Internal Medicine

## 2015-01-18 ENCOUNTER — Encounter: Payer: Self-pay | Admitting: Internal Medicine

## 2015-01-28 ENCOUNTER — Other Ambulatory Visit: Payer: Self-pay | Admitting: Internal Medicine

## 2015-01-30 ENCOUNTER — Ambulatory Visit (HOSPITAL_BASED_OUTPATIENT_CLINIC_OR_DEPARTMENT_OTHER): Payer: 59 | Admitting: Oncology

## 2015-01-30 ENCOUNTER — Telehealth: Payer: Self-pay | Admitting: Oncology

## 2015-01-30 ENCOUNTER — Other Ambulatory Visit (INDEPENDENT_AMBULATORY_CARE_PROVIDER_SITE_OTHER): Payer: 59

## 2015-01-30 ENCOUNTER — Other Ambulatory Visit (HOSPITAL_BASED_OUTPATIENT_CLINIC_OR_DEPARTMENT_OTHER): Payer: 59

## 2015-01-30 ENCOUNTER — Telehealth: Payer: Self-pay | Admitting: *Deleted

## 2015-01-30 ENCOUNTER — Ambulatory Visit: Payer: 59

## 2015-01-30 VITALS — BP 165/84 | HR 109 | Temp 98.2°F | Resp 18 | Ht 68.0 in | Wt 238.8 lb

## 2015-01-30 DIAGNOSIS — IMO0002 Reserved for concepts with insufficient information to code with codable children: Secondary | ICD-10-CM

## 2015-01-30 DIAGNOSIS — E1165 Type 2 diabetes mellitus with hyperglycemia: Secondary | ICD-10-CM

## 2015-01-30 DIAGNOSIS — D509 Iron deficiency anemia, unspecified: Secondary | ICD-10-CM

## 2015-01-30 DIAGNOSIS — N920 Excessive and frequent menstruation with regular cycle: Secondary | ICD-10-CM

## 2015-01-30 LAB — COMPREHENSIVE METABOLIC PANEL (CC13)
ALK PHOS: 109 U/L (ref 40–150)
ALT: 22 U/L (ref 0–55)
AST: 20 U/L (ref 5–34)
Albumin: 3.5 g/dL (ref 3.5–5.0)
Anion Gap: 11 mEq/L (ref 3–11)
BILIRUBIN TOTAL: 0.72 mg/dL (ref 0.20–1.20)
BUN: 8.5 mg/dL (ref 7.0–26.0)
CO2: 20 mEq/L — ABNORMAL LOW (ref 22–29)
CREATININE: 0.7 mg/dL (ref 0.6–1.1)
Calcium: 8.4 mg/dL (ref 8.4–10.4)
Chloride: 104 mEq/L (ref 98–109)
EGFR: >90 mL/min/{1.73_m2} (ref >90)
Glucose: 316 mg/dl — ABNORMAL HIGH (ref 70–140)
Potassium: 4 mEq/L (ref 3.5–5.1)
Sodium: 135 mEq/L — ABNORMAL LOW (ref 136–145)
Total Protein: 6.9 g/dL (ref 6.4–8.3)

## 2015-01-30 LAB — CBC WITH DIFFERENTIAL/PLATELET
BASO%: 0.6 % (ref 0.0–2.0)
BASOS ABS: 0.1 10*3/uL (ref 0.0–0.1)
EOS ABS: 0.2 10*3/uL (ref 0.0–0.5)
EOS%: 2.5 % (ref 0.0–7.0)
HEMATOCRIT: 36.5 % (ref 34.8–46.6)
HGB: 11.3 g/dL — ABNORMAL LOW (ref 11.6–15.9)
LYMPH%: 25.6 % (ref 14.0–49.7)
MCH: 24.9 pg — ABNORMAL LOW (ref 25.1–34.0)
MCHC: 31 g/dL — ABNORMAL LOW (ref 31.5–36.0)
MCV: 80.6 fL (ref 79.5–101.0)
MONO#: 0.3 10*3/uL (ref 0.1–0.9)
MONO%: 3.8 % (ref 0.0–14.0)
NEUT#: 5.9 10*3/uL (ref 1.5–6.5)
NEUT%: 67.5 % (ref 38.4–76.8)
PLATELETS: 239 10*3/uL (ref 145–400)
RBC: 4.53 10*6/uL (ref 3.70–5.45)
RDW: 14.5 % (ref 11.2–14.5)
WBC: 8.7 10*3/uL (ref 3.9–10.3)
lymph#: 2.2 10*3/uL (ref 0.9–3.3)

## 2015-01-30 LAB — BASIC METABOLIC PANEL
BUN: 9 mg/dL (ref 6–23)
CHLORIDE: 100 meq/L (ref 96–112)
CO2: 25 meq/L (ref 19–32)
Calcium: 8.8 mg/dL (ref 8.4–10.5)
Creatinine, Ser: 0.53 mg/dL (ref 0.40–1.20)
GFR: 131.93 mL/min (ref >60.00)
Glucose, Bld: 264 mg/dL — ABNORMAL HIGH (ref 70–99)
Potassium: 3.9 mEq/L (ref 3.5–5.1)
Sodium: 132 mEq/L — ABNORMAL LOW (ref 135–145)

## 2015-01-30 LAB — HEMOGLOBIN A1C: HEMOGLOBIN A1C: 8.6 % — AB (ref 4.6–6.5)

## 2015-01-30 LAB — FERRITIN CHCC: FERRITIN: 10 ng/mL (ref 9–269)

## 2015-01-30 LAB — IRON AND TIBC CHCC
%SAT: 8 % — ABNORMAL LOW (ref 21–57)
IRON: 29 ug/dL — AB (ref 41–142)
TIBC: 350 ug/dL (ref 236–444)
UIBC: 321 ug/dL (ref 120–384)

## 2015-01-30 NOTE — Telephone Encounter (Signed)
Left message to confirm appointment for February.

## 2015-01-30 NOTE — Telephone Encounter (Signed)
Per staff message and POF I have scheduled appts. Advised scheduler of appts. JMW  

## 2015-01-30 NOTE — Telephone Encounter (Signed)
Gave avs & calendar for June. Sent message to schedule treatment. °

## 2015-01-30 NOTE — Progress Notes (Signed)
Hematology and Oncology Follow Up Visit  [name] [name] [id number] 05/04/1966 49 y.o. 01/30/2015 10:43 AM [name] [name], MDPlotnikov, [name], MD   Principle Diagnosis: 49 year old woman diagnosed with iron deficiency anemia after she presented in July of 2014 with a hemoglobin of 8.4. Her iron studies showed iron level of 25 and ferritin of 5.   Prior Therapy: She is status post IV iron in the form of Feraheme given on 06/22/2013 and repeated in 02/03/2014.   Current therapy: Observation and followup.  Interim History: [name] [name] presents today for routine followup. Since the last visit, she is reporting more symptoms of fatigue and tiredness. She is reporting more ice cravings and inability to have the same stamina as before. She is having hard time concentrating at work and performing work related duties. She continues to have menstrual cycles but they are more sporadic but still have a.  She is not reporting any symptoms of chest pain or shortness of breath. Has not reported any more hematochezia or melena.   She has not reported any headaches or blurred vision or double vision. She does not report any syncope or seizures. Does not report any nausea or vomiting or abdominal pain. She does not report any neuropathy. She does not report any hemoptysis or hematemesis. She does not report any genitourinary bleeding. He does not report any skeletal pain. Rest of her review of systems unremarkable.  Medications: I have reviewed the patient's current medications.  Current Outpatient Prescriptions  Medication Sig Dispense Refill  . ALPRAZolam (XANAX) 0.5 MG tablet Take 1 tablet (0.5 mg total) by mouth 2 (two) times daily as needed. 60 tablet 1  . atorvastatin (LIPITOR) 10 MG tablet TAKE 1 TABLET DAILY 90 tablet 3  . buPROPion (WELLBUTRIN SR) 150 MG 12 hr tablet TAKE 1 TABLET TWICE A DAY 180 tablet 2  . Canagliflozin (INVOKANA) 300 MG TABS Take 1 tablet (300 mg total) by mouth daily. 30 tablet 11   . Cholecalciferol (EQL VITAMIN D3) 1000 UNITS tablet Take 1,000 Units by mouth daily.      . citalopram (CELEXA) 20 MG tablet TAKE 1 TABLET DAILY 90 tablet 2  . cyclobenzaprine (FLEXERIL) 5 MG tablet 1 tablet at bedtime 30 tablet 5  . Fluticasone Furoate-Vilanterol (BREO ELLIPTA) 100-25 MCG/INH AEPB Inhale 1 Act into the lungs daily. 1 each 5  . Insulin Pen Needle (BD PEN NEEDLE NANO U/F) 32G X 4 MM MISC USE 1 NEEDLE DAILY AS DIRECTED 50 each 3  . meloxicam (MOBIC) 7.5 MG tablet Take 1 tablet (7.5 mg total) by mouth daily. 30 tablet 0  . metFORMIN (GLUCOPHAGE) 500 MG tablet TAKE 2 TABLETS ( 1,000 MG  TOTAL ) TWICE A DAY WITH MEALS 360 tablet 1  . NIFEdipine (PROCARDIA XL/ADALAT-CC) 90 MG 24 hr tablet TAKE 1 TABLET DAILY 90 tablet 3  . omeprazole (PRILOSEC) 40 MG capsule TAKE 1 CAPSULE DAILY 90 capsule 2  . traMADol (ULTRAM) 50 MG tablet Take 1-2 tablets (50-100 mg total) by mouth 2 (two) times daily as needed. 60 tablet 0  . VICTOZA 18 MG/3ML SOPN INJECT 1.8 MG UNDER THE SKIN DAILY 3 mL 2   Current Facility-Administered Medications  Medication Dose Route Frequency Provider Last Rate Last Dose  . methylPREDNISolone acetate (DEPO-MEDROL) 20 MG/ML injection 20 mg  20 mg Intra-articular Once Lew Dawes V, MD         Allergies: No Known Allergies  Past Medical History, Surgical history, Social history, and Family History were reviewed  and updated.   Physical Exam: Blood pressure 165/84, pulse 109, temperature 98.2 F (36.8 C), temperature source Oral, resp. rate 18, height 5\' 8"  (1.727 m), weight 238 lb 12.8 oz (108.319 kg). ECOG: 0 General appearance: alert and cooperative Head: Normocephalic, without obvious abnormality Neck: no adenopathy Lymph nodes: Cervical, supraclavicular, and axillary nodes normal. Heart:regular rate and rhythm, S1, S2 normal, no murmur, click, rub or gallop Lung:chest clear, no wheezing, no shifting dullness. Abdomen: soft, non-tender, no ascites or  shifting dullness EXT:no erythema, induration, or nodules no edema.   Lab Results: Lab Results  Component Value Date   WBC 7.3 09/29/2014   HGB 12.8 09/29/2014   HCT 39.7 09/29/2014   MCV 84.8 09/29/2014   PLT 183 09/29/2014     Chemistry      Component Value Date/Time   NA 137 09/29/2014 0935   NA 133* 09/29/2014 0800   K 4.5 09/29/2014 0935   K 4.2 09/29/2014 0800   CL 101 09/29/2014 0800   CO2 26 09/29/2014 0935   CO2 26 09/29/2014 0800   BUN 6.3* 09/29/2014 0935   BUN 7 09/29/2014 0800   CREATININE 0.7 09/29/2014 0935   CREATININE 0.4 09/29/2014 0800      Component Value Date/Time   CALCIUM 9.5 09/29/2014 0935   CALCIUM 9.3 09/29/2014 0800   ALKPHOS 110 09/29/2014 0935   ALKPHOS 81 02/28/2013 1215   AST 17 09/29/2014 0935   AST 30 02/28/2013 1215   ALT 24 09/29/2014 0935   ALT 34 02/28/2013 1215   BILITOT 0.87 09/29/2014 0935   BILITOT 0.8 02/28/2013 1215        Impression and Plan:  49 year old woman with the following issues:   1. Microcytic hypochromic anemia with documented findings of iron deficiency anemia. She is status post Feraheme infusion on 02/03/2014. She tolerated it very well and her hemoglobin is pending from today. However, she is symptomatic at this time I feel it very likely that she has iron deficiency again. I am checking her iron stores today and I will tentatively scheduled for intravenous IV iron infusion in the near future. I will also exclude her from work for the next few days to get this accomplished.  2. Heavy menstrual cycles: She will followup with her gynecologist regarding that. Iron deficiency will continue to be an issue long as she has heavy menstrual cycles.  3. Follow-up: In 4 months   [facility], MD 2/9/201610:43 AM

## 2015-01-31 ENCOUNTER — Telehealth: Payer: Self-pay

## 2015-01-31 NOTE — Telephone Encounter (Signed)
PA for invokana started 01/24/15. Checked status - no determination.  Resending to plan.

## 2015-02-01 ENCOUNTER — Other Ambulatory Visit: Payer: Self-pay | Admitting: *Deleted

## 2015-02-01 ENCOUNTER — Telehealth: Payer: Self-pay

## 2015-02-01 MED ORDER — METFORMIN HCL 500 MG PO TABS: ORAL_TABLET | ORAL | Status: DC

## 2015-02-01 NOTE — Telephone Encounter (Signed)
PA was not able to be processed due to gender code. Gender code changed to female and PA was approved.   LVM for pt to call back.   RE:  [name] Auerbach PA was approved and insurance has her listed as a female.

## 2015-02-01 NOTE — Telephone Encounter (Signed)
Pt informed of gender issue with insurance message.

## 2015-02-01 NOTE — Telephone Encounter (Signed)
Spoke to pt regarding FLU VACCINE.  Pt DECLINES getting a flu vaccine.

## 2015-02-01 NOTE — Telephone Encounter (Signed)
Left msg on triage stating she hasn't recieved mail order yet on her metformin needing rx sent to local [facility] because she is out...[name] [name]

## 2015-02-02 ENCOUNTER — Ambulatory Visit: Payer: 59

## 2015-02-02 ENCOUNTER — Ambulatory Visit (HOSPITAL_BASED_OUTPATIENT_CLINIC_OR_DEPARTMENT_OTHER): Payer: 59

## 2015-02-02 DIAGNOSIS — D509 Iron deficiency anemia, unspecified: Secondary | ICD-10-CM

## 2015-02-02 MED ORDER — SODIUM CHLORIDE 0.9 % IV SOLN
Freq: Once | INTRAVENOUS | Status: AC
  Administered 2015-02-02: 11:00:00 via INTRAVENOUS

## 2015-02-02 MED ORDER — SODIUM CHLORIDE 0.9 % IV SOLN
510.0000 mg | Freq: Once | INTRAVENOUS | Status: AC
  Administered 2015-02-02: 510 mg via INTRAVENOUS
  Filled 2015-02-02: qty 17

## 2015-02-02 NOTE — Patient Instructions (Signed)

## 2015-02-09 ENCOUNTER — Ambulatory Visit (HOSPITAL_BASED_OUTPATIENT_CLINIC_OR_DEPARTMENT_OTHER): Payer: 59

## 2015-02-09 ENCOUNTER — Ambulatory Visit: Payer: 59

## 2015-02-09 DIAGNOSIS — D509 Iron deficiency anemia, unspecified: Secondary | ICD-10-CM

## 2015-02-09 MED ORDER — SODIUM CHLORIDE 0.9 % IV SOLN
Freq: Once | INTRAVENOUS | Status: AC
  Administered 2015-02-09: 09:00:00 via INTRAVENOUS

## 2015-02-09 MED ORDER — SODIUM CHLORIDE 0.9 % IV SOLN
510.0000 mg | Freq: Once | INTRAVENOUS | Status: AC
  Administered 2015-02-09: 510 mg via INTRAVENOUS
  Filled 2015-02-09: qty 17

## 2015-02-09 NOTE — Progress Notes (Signed)
Patient refused to wait 30 minutes post Feraheme infusion.  Post vital signs stable, patient ambulated well with no signs or symptoms of distress noted.

## 2015-02-09 NOTE — Patient Instructions (Signed)

## 2015-02-12 ENCOUNTER — Encounter: Payer: Self-pay | Admitting: Internal Medicine

## 2015-02-21 ENCOUNTER — Ambulatory Visit (INDEPENDENT_AMBULATORY_CARE_PROVIDER_SITE_OTHER): Payer: 59 | Admitting: Internal Medicine

## 2015-02-21 ENCOUNTER — Encounter: Payer: Self-pay | Admitting: Internal Medicine

## 2015-02-21 ENCOUNTER — Telehealth: Payer: Self-pay | Admitting: Oncology

## 2015-02-21 VITALS — BP 160/90 | HR 105 | Temp 97.8°F | Wt 237.2 lb

## 2015-02-21 DIAGNOSIS — E1165 Type 2 diabetes mellitus with hyperglycemia: Secondary | ICD-10-CM

## 2015-02-21 DIAGNOSIS — G43009 Migraine without aura, not intractable, without status migrainosus: Secondary | ICD-10-CM

## 2015-02-21 DIAGNOSIS — I10 Essential (primary) hypertension: Secondary | ICD-10-CM

## 2015-02-21 DIAGNOSIS — F17200 Nicotine dependence, unspecified, uncomplicated: Secondary | ICD-10-CM

## 2015-02-21 DIAGNOSIS — D509 Iron deficiency anemia, unspecified: Secondary | ICD-10-CM

## 2015-02-21 DIAGNOSIS — Z23 Encounter for immunization: Secondary | ICD-10-CM

## 2015-02-21 DIAGNOSIS — Z0000 Encounter for general adult medical examination without abnormal findings: Secondary | ICD-10-CM

## 2015-02-21 DIAGNOSIS — IMO0002 Reserved for concepts with insufficient information to code with codable children: Secondary | ICD-10-CM

## 2015-02-21 DIAGNOSIS — F411 Generalized anxiety disorder: Secondary | ICD-10-CM

## 2015-02-21 MED ORDER — ALPRAZOLAM 0.5 MG PO TABS: 0.5000 mg | ORAL_TABLET | Freq: Two times a day (BID) | ORAL | Status: DC | PRN

## 2015-02-21 MED ORDER — DULAGLUTIDE 1.5 MG/0.5ML ~~LOC~~ SOAJ: SUBCUTANEOUS | Status: DC

## 2015-02-21 MED ORDER — OMEPRAZOLE 40 MG PO CPDR: 40.0000 mg | DELAYED_RELEASE_CAPSULE | Freq: Every day | ORAL | Status: DC

## 2015-02-21 MED ORDER — SUMATRIPTAN SUCCINATE 100 MG PO TABS: 100.0000 mg | ORAL_TABLET | ORAL | Status: DC | PRN

## 2015-02-21 NOTE — Assessment & Plan Note (Signed)
We discussed age appropriate health related issues, including available/recomended screening tests and vaccinations. We discussed a need for adhering to healthy diet and exercise. Labs/EKG were reviewed/ordered. All questions were answered.   

## 2015-02-21 NOTE — Assessment & Plan Note (Signed)
Insurance did not cover Invokana x 1 mo

## 2015-02-21 NOTE — Assessment & Plan Note (Signed)
Will try Imitrex prn Tramadol

## 2015-02-21 NOTE — Assessment & Plan Note (Signed)
Xanax prn  Potential benefits of a long term benzodiazepines  use as well as potential risks  and complications were explained to the patient and were aknowledged. 

## 2015-02-21 NOTE — Assessment & Plan Note (Signed)
Smoking very little

## 2015-02-21 NOTE — Telephone Encounter (Signed)
Contact referring provider to inform that pt is currently under care.  Provider will call back to infor if want to maintain appt.

## 2015-02-21 NOTE — Assessment & Plan Note (Signed)
Iron deficiency Dr Alen Blew  6/13 severe 5/14 not responding to po iron - on IV iron infusions

## 2015-02-21 NOTE — Progress Notes (Signed)
Subjective:    HPI  The patient is here for a wellness exam. The patient had issues w/pre-approvals of her DM meds. C/o fatigue and SOB when Hgb is low.  The patient needs to address  chronic hypertension that has been well controlled with medicines; to address h/o CHF controlled with medicines as well; and to address type 2 chronic diabetes - worse.  BP Readings from Last 3 Encounters:  02/21/15 160/90  02/09/15 137/76  02/02/15 148/70   Wt Readings from Last 3 Encounters:  02/21/15 237 lb 4 oz (107.616 kg)  01/30/15 238 lb 12.8 oz (108.319 kg)  10/03/14 238 lb (107.956 kg)      Review of Systems  Constitutional: Positive for fatigue. Negative for fever, chills, diaphoresis, activity change, appetite change and unexpected weight change.  HENT: Negative for congestion, dental problem, ear pain, hearing loss, mouth sores, postnasal drip, sinus pressure, sneezing, sore throat and voice change.   Eyes: Negative for pain and visual disturbance.  Respiratory: Negative for cough, chest tightness, wheezing and stridor.   Cardiovascular: Negative for chest pain, palpitations and leg swelling.  Gastrointestinal: Negative for nausea, vomiting, abdominal pain, blood in stool, abdominal distention and rectal pain.  Genitourinary: Positive for vaginal bleeding and menstrual problem. Negative for dysuria, hematuria, decreased urine volume, vaginal discharge, difficulty urinating and vaginal pain.  Musculoskeletal: Negative for back pain, joint swelling, gait problem and neck pain.  Skin: Negative for color change, rash and wound.  Neurological: Negative for dizziness, tremors, syncope, speech difficulty and light-headedness.  Hematological: Negative for adenopathy.  Psychiatric/Behavioral: Negative for suicidal ideas, hallucinations, behavioral problems, confusion, sleep disturbance, dysphoric mood and decreased concentration. The patient is not hyperactive.        Objective:   Physical  Exam  Constitutional: She appears well-developed. No distress.  Obese   HENT:  Head: Normocephalic.  Right Ear: External ear normal.  Left Ear: External ear normal.  Nose: Nose normal.  Mouth/Throat: Oropharynx is clear and moist.  Eyes: Conjunctivae are normal. Pupils are equal, round, and reactive to light. Right eye exhibits no discharge. Left eye exhibits no discharge.  Neck: Normal range of motion. Neck supple. No JVD present. No tracheal deviation present. No thyromegaly present.  Cardiovascular: Normal rate, regular rhythm and normal heart sounds.   Pulmonary/Chest: No stridor. No respiratory distress. She has no wheezes.  Abdominal: Soft. Bowel sounds are normal. She exhibits no distension and no mass. There is no tenderness. There is no rebound and no guarding.  Musculoskeletal: She exhibits no edema or tenderness.  Lymphadenopathy:    She has no cervical adenopathy.  Neurological: She displays normal reflexes. No cranial nerve deficit. She exhibits normal muscle tone. Coordination normal.  Skin: No rash noted. No erythema.  pale  Psychiatric: She has a normal mood and affect. Her behavior is normal. Judgment and thought content normal.     Lab Results  Component Value Date   WBC 8.7 01/30/2015   HGB 11.3* 01/30/2015   HCT 36.5 01/30/2015   PLT 239 01/30/2015   GLUCOSE 316* 01/30/2015   CHOL 169 09/29/2014   TRIG * 09/29/2014    485.0 Triglyceride is over 400; calculations on Lipids are invalid.   HDL 28.00* 09/29/2014   LDLDIRECT 72.3 09/29/2014   ALT 22 01/30/2015   AST 20 01/30/2015   NA 135* 01/30/2015   K 4.0 01/30/2015   CL 100 01/30/2015   CREATININE 0.7 01/30/2015   BUN 8.5 01/30/2015   CO2 20* 01/30/2015  TSH 1.81 02/28/2013   HGBA1C 8.6* 01/30/2015        Assessment & Plan:

## 2015-02-21 NOTE — Progress Notes (Signed)
Pre visit review using our clinic review tool, if applicable. No additional management support is needed unless otherwise documented below in the visit note. 

## 2015-03-08 ENCOUNTER — Telehealth: Payer: Self-pay

## 2015-03-08 NOTE — Telephone Encounter (Signed)
Pt declined flu vaccine

## 2015-03-26 ENCOUNTER — Other Ambulatory Visit: Payer: Self-pay | Admitting: Internal Medicine

## 2015-03-26 NOTE — Telephone Encounter (Signed)
Victoza was d/c due to cost. She tried to fill Trulicity, but it needs prior [place]. She states she needs Victoza once more while waiting for PA. Please advise ok to Rf victoza?

## 2015-03-30 ENCOUNTER — Other Ambulatory Visit: Payer: Self-pay | Admitting: *Deleted

## 2015-03-30 MED ORDER — LIRAGLUTIDE 18 MG/3ML ~~LOC~~ SOPN: PEN_INJECTOR | SUBCUTANEOUS | Status: DC

## 2015-04-02 ENCOUNTER — Other Ambulatory Visit: Payer: Self-pay | Admitting: Internal Medicine

## 2015-04-04 ENCOUNTER — Telehealth: Payer: Self-pay | Admitting: *Deleted

## 2015-04-04 NOTE — Telephone Encounter (Signed)
Rec fax stating Trulicity PA has been approved from 04/03/15 until 04/02/16. Pharmacy informed.

## 2015-05-11 ENCOUNTER — Ambulatory Visit: Payer: 59 | Admitting: Internal Medicine

## 2015-05-27 ENCOUNTER — Other Ambulatory Visit: Payer: Self-pay | Admitting: Internal Medicine

## 2015-05-29 ENCOUNTER — Other Ambulatory Visit: Payer: 59

## 2015-05-29 ENCOUNTER — Ambulatory Visit: Payer: 59 | Admitting: Physician Assistant

## 2015-05-29 MED ORDER — CYCLOBENZAPRINE HCL 5 MG PO TABS: ORAL_TABLET | ORAL | Status: DC

## 2015-05-29 MED ORDER — BUPROPION HCL ER (SR) 150 MG PO TB12: 150.0000 mg | ORAL_TABLET | Freq: Two times a day (BID) | ORAL | Status: DC

## 2015-05-29 MED ORDER — ATORVASTATIN CALCIUM 10 MG PO TABS: 10.0000 mg | ORAL_TABLET | Freq: Every day | ORAL | Status: DC

## 2015-05-29 MED ORDER — OMEPRAZOLE 40 MG PO CPDR: 40.0000 mg | DELAYED_RELEASE_CAPSULE | Freq: Every day | ORAL | Status: DC

## 2015-05-29 NOTE — Telephone Encounter (Signed)
Sent maintenance meds pls advise on tramadol...[name] [name]

## 2015-05-30 ENCOUNTER — Telehealth: Payer: Self-pay | Admitting: Oncology

## 2015-05-30 ENCOUNTER — Other Ambulatory Visit (HOSPITAL_BASED_OUTPATIENT_CLINIC_OR_DEPARTMENT_OTHER): Payer: 59

## 2015-05-30 ENCOUNTER — Ambulatory Visit (HOSPITAL_BASED_OUTPATIENT_CLINIC_OR_DEPARTMENT_OTHER): Payer: 59 | Admitting: Oncology

## 2015-05-30 ENCOUNTER — Encounter: Payer: Self-pay | Admitting: Oncology

## 2015-05-30 VITALS — BP 125/72 | HR 96 | Temp 98.2°F | Resp 20 | Ht 68.0 in | Wt 235.9 lb

## 2015-05-30 DIAGNOSIS — D649 Anemia, unspecified: Secondary | ICD-10-CM

## 2015-05-30 DIAGNOSIS — D509 Iron deficiency anemia, unspecified: Secondary | ICD-10-CM

## 2015-05-30 LAB — COMPREHENSIVE METABOLIC PANEL (CC13)
ALT: 17 U/L (ref 0–55)
AST: 16 U/L (ref 5–34)
Albumin: 3.6 g/dL (ref 3.5–5.0)
Alkaline Phosphatase: 92 U/L (ref 40–150)
Anion Gap: 9 mEq/L (ref 3–11)
BUN: 11.9 mg/dL (ref 7.0–26.0)
CO2: 23 mEq/L (ref 22–29)
Calcium: 8.8 mg/dL (ref 8.4–10.4)
Chloride: 106 mEq/L (ref 98–109)
Creatinine: 0.7 mg/dL (ref 0.6–1.1)
EGFR: >90 mL/min/{1.73_m2} (ref >90)
Glucose: 223 mg/dl — ABNORMAL HIGH (ref 70–140)
Potassium: 4.4 mEq/L (ref 3.5–5.1)
Sodium: 137 mEq/L (ref 136–145)
Total Bilirubin: 0.84 mg/dL (ref 0.20–1.20)
Total Protein: 6.9 g/dL (ref 6.4–8.3)

## 2015-05-30 LAB — CBC WITH DIFFERENTIAL/PLATELET
BASO%: 1.5 % (ref 0.0–2.0)
BASOS ABS: 0.1 10*3/uL (ref 0.0–0.1)
EOS%: 2.4 % (ref 0.0–7.0)
Eosinophils Absolute: 0.2 10*3/uL (ref 0.0–0.5)
HEMATOCRIT: 41.4 % (ref 34.8–46.6)
HGB: 13.6 g/dL (ref 11.6–15.9)
LYMPH%: 24.1 % (ref 14.0–49.7)
MCH: 28.2 pg (ref 25.1–34.0)
MCHC: 32.9 g/dL (ref 31.5–36.0)
MCV: 85.8 fL (ref 79.5–101.0)
MONO#: 0.3 10*3/uL (ref 0.1–0.9)
MONO%: 3.8 % (ref 0.0–14.0)
NEUT%: 68.2 % (ref 38.4–76.8)
NEUTROS ABS: 5.8 10*3/uL (ref 1.5–6.5)
Platelets: 209 10*3/uL (ref 145–400)
RBC: 4.83 10*6/uL (ref 3.70–5.45)
RDW: 14.1 % (ref 11.2–14.5)
WBC: 8.5 10*3/uL (ref 3.9–10.3)
lymph#: 2.1 10*3/uL (ref 0.9–3.3)

## 2015-05-30 LAB — FERRITIN CHCC: Ferritin: 21 ng/ml (ref 9–269)

## 2015-05-30 LAB — IRON AND TIBC CHCC
%SAT: 18 % — ABNORMAL LOW (ref 21–57)
Iron: 56 ug/dL (ref 41–142)
TIBC: 305 ug/dL (ref 236–444)
UIBC: 249 ug/dL (ref 120–384)

## 2015-05-30 NOTE — Telephone Encounter (Signed)
per pof to sch pt appt-gave pt avs °

## 2015-05-30 NOTE — Progress Notes (Signed)
Hematology and Oncology Follow Up Visit  [name] [name] [id number] 08-30-1966 49 y.o. 05/30/2015 9:32 AM [name], MDPlotnikov, [name], MD   Principle Diagnosis: 49 year old woman diagnosed with iron deficiency anemia after she presented in July of 2014 with a hemoglobin of 8.4. Her iron studies showed iron level of 25 and ferritin of 5.   Prior Therapy: She is status post IV iron in the form of Feraheme given on 06/22/2013, 02/03/2014, and 02/09/15.   Current therapy: Observation and followup.  Interim History: [name] presents today for routine followup. Patient is feeling well today. She states that she is having increase issues with anxiety and not sleeping well. She plans to see her PCP on June 14. She is otherwise not having any symptoms related to her anemia. She continues to be able to work full time. Her menstrual cycles are more sporadic. She has follow-up with her gynecologist on a regular basis. She is not reporting any symptoms of chest pain or shortness of breath. Has not reported any more hematochezia or melena. She has not reported any headaches or blurred vision or double vision. She does not report any syncope or seizures. Does not report any nausea or vomiting or abdominal pain. She does not report any neuropathy. She does not report any hemoptysis or hematemesis. She does not report any genitourinary bleeding. He does not report any skeletal pain. Rest of her review of systems unremarkable.  Medications: I have reviewed the patient's current medications.  Current Outpatient Prescriptions  Medication Sig Dispense Refill  . ALPRAZolam (XANAX) 0.5 MG tablet Take 1 tablet (0.5 mg total) by mouth 2 (two) times daily as needed. 60 tablet 3  . atorvastatin (LIPITOR) 10 MG tablet Take 1 tablet (10 mg total) by mouth daily. 90 tablet 2  . buPROPion (WELLBUTRIN SR) 150 MG 12 hr tablet Take 1 tablet (150 mg total) by mouth 2 (two) times daily. 180 tablet 2  . Canagliflozin  (INVOKANA) 300 MG TABS Take 1 tablet (300 mg total) by mouth daily. 30 tablet 11  . Cholecalciferol (EQL VITAMIN D3) 1000 UNITS tablet Take 1,000 Units by mouth daily.      . citalopram (CELEXA) 20 MG tablet TAKE 1 TABLET DAILY 90 tablet 2  . cyclobenzaprine (FLEXERIL) 5 MG tablet 1 tablet at bedtime 30 tablet 5  . Dulaglutide (TRULICITY) 1.5 AS/3.4HD SOPN Use weekly 4 pen 11  . Liraglutide (VICTOZA) 18 MG/3ML SOPN INJECT 0.3 MLS (1.8 MG TOTAL) INTO THE SKIN DAILY. 2 pen 11  . metFORMIN (GLUCOPHAGE) 500 MG tablet TAKE 2 TABLETS ( 1,000 MG TOTAL ) TWICE A DAY WITH MEALS 120 tablet 11  . NIFEdipine (PROCARDIA XL/ADALAT-CC) 90 MG 24 hr tablet TAKE 1 TABLET DAILY 90 tablet 3  . omeprazole (PRILOSEC) 40 MG capsule Take 1 capsule (40 mg total) by mouth daily. 90 capsule 2  . SUMAtriptan (IMITREX) 100 MG tablet Take 1 tablet (100 mg total) by mouth every 2 (two) hours as needed for migraine. May repeat once in 2 hours if headache persists or recurs. 12 tablet 4  . Fluticasone Furoate-Vilanterol (BREO ELLIPTA) 100-25 MCG/INH AEPB Inhale 1 Act into the lungs daily. (Patient not taking: Reported on 05/30/2015) 1 each 5  . Insulin Pen Needle (BD PEN NEEDLE NANO U/F) 32G X 4 MM MISC USE 1 NEEDLE DAILY AS DIRECTED (Patient not taking: Reported on 05/30/2015) 50 each 3  . meloxicam (MOBIC) 7.5 MG tablet Take 1 tablet (7.5 mg total) by mouth daily. (Patient not  taking: Reported on 05/30/2015) 30 tablet 0  . traMADol (ULTRAM) 50 MG tablet Take 1-2 tablets (50-100 mg total) by mouth 2 (two) times daily as needed. (Patient not taking: Reported on 05/30/2015) 60 tablet 0   Current Facility-Administered Medications  Medication Dose Route Frequency Provider Last Rate Last Dose  . methylPREDNISolone acetate (DEPO-MEDROL) 20 MG/ML injection 20 mg  20 mg Intra-articular Once Lew Dawes V, MD         Allergies: No Known Allergies  Past Medical History, Surgical history, Social history, and Family History were reviewed  and updated.   Physical Exam: Blood pressure 125/72, pulse 96, temperature 98.2 F (36.8 C), temperature source Oral, resp. rate 20, height 5\' 8"  (1.727 m), weight 235 lb 14.4 oz (107.004 kg), SpO2 97 %. ECOG: 0 General appearance: alert and cooperative Head: Normocephalic, without obvious abnormality Neck: no adenopathy Lymph nodes: Cervical, supraclavicular, and axillary nodes normal. Heart:regular rate and rhythm, S1, S2 normal, no murmur, click, rub or gallop Lung:chest clear, no wheezing, no shifting dullness. Abdomen: soft, non-tender, no ascites or shifting dullness EXT:no erythema, induration, or nodules no edema.   Lab Results: Lab Results  Component Value Date   WBC 8.5 05/30/2015   HGB 13.6 05/30/2015   HCT 41.4 05/30/2015   MCV 85.8 05/30/2015   PLT 209 05/30/2015     Chemistry      Component Value Date/Time   NA 135* 01/30/2015 1052   NA 132* 01/30/2015 0907   K 4.0 01/30/2015 1052   K 3.9 01/30/2015 0907   CL 100 01/30/2015 0907   CO2 20* 01/30/2015 1052   CO2 25 01/30/2015 0907   BUN 8.5 01/30/2015 1052   BUN 9 01/30/2015 0907   CREATININE 0.7 01/30/2015 1052   CREATININE 0.53 01/30/2015 0907      Component Value Date/Time   CALCIUM 8.4 01/30/2015 1052   CALCIUM 8.8 01/30/2015 0907   ALKPHOS 109 01/30/2015 1052   ALKPHOS 81 02/28/2013 1215   AST 20 01/30/2015 1052   AST 30 02/28/2013 1215   ALT 22 01/30/2015 1052   ALT 34 02/28/2013 1215   BILITOT 0.72 01/30/2015 1052   BILITOT 0.8 02/28/2013 1215        Impression and Plan:  49 year old woman with the following issues:   1. Microcytic hypochromic anemia with documented findings of iron deficiency anemia. She is status post Feraheme infusion most recently in February 2016. She tolerated it very well. Her hemoglobin is normal today at 13.6. We will continue observation for now.  2. Heavy menstrual cycles: She is seen by her gynecologist for this issue..  3. Follow-up: In 4  months   [name], DNP, AGPCNP-BC, AOCNP 6/8/20169:32 AM

## 2015-06-05 ENCOUNTER — Other Ambulatory Visit: Payer: Self-pay | Admitting: Internal Medicine

## 2015-06-05 ENCOUNTER — Ambulatory Visit (INDEPENDENT_AMBULATORY_CARE_PROVIDER_SITE_OTHER): Payer: 59 | Admitting: Internal Medicine

## 2015-06-05 ENCOUNTER — Encounter: Payer: Self-pay | Admitting: Internal Medicine

## 2015-06-05 VITALS — BP 140/80 | HR 107 | Temp 97.7°F | Ht 68.0 in | Wt 239.0 lb

## 2015-06-05 DIAGNOSIS — IMO0002 Reserved for concepts with insufficient information to code with codable children: Secondary | ICD-10-CM

## 2015-06-05 DIAGNOSIS — F411 Generalized anxiety disorder: Secondary | ICD-10-CM

## 2015-06-05 DIAGNOSIS — F4323 Adjustment disorder with mixed anxiety and depressed mood: Secondary | ICD-10-CM | POA: Diagnosis not present

## 2015-06-05 DIAGNOSIS — E1165 Type 2 diabetes mellitus with hyperglycemia: Secondary | ICD-10-CM | POA: Diagnosis not present

## 2015-06-05 MED ORDER — BUPROPION HCL ER (SR) 150 MG PO TB12: 150.0000 mg | ORAL_TABLET | Freq: Two times a day (BID) | ORAL | Status: DC

## 2015-06-05 MED ORDER — BUPROPION HCL ER (XL) 150 MG PO TB24: 150.0000 mg | ORAL_TABLET | Freq: Every day | ORAL | Status: DC

## 2015-06-05 MED ORDER — NIFEDIPINE ER OSMOTIC RELEASE 90 MG PO TB24: 90.0000 mg | ORAL_TABLET | Freq: Every day | ORAL | Status: DC

## 2015-06-05 MED ORDER — DULOXETINE HCL 30 MG PO CPEP: 30.0000 mg | ORAL_CAPSULE | Freq: Every day | ORAL | Status: DC

## 2015-06-05 MED ORDER — CITALOPRAM HYDROBROMIDE 20 MG PO TABS: 20.0000 mg | ORAL_TABLET | Freq: Every day | ORAL | Status: DC

## 2015-06-05 MED ORDER — CANAGLIFLOZIN 300 MG PO TABS: 300.0000 mg | ORAL_TABLET | Freq: Every day | ORAL | Status: DC

## 2015-06-05 MED ORDER — OMEPRAZOLE 40 MG PO CPDR: 40.0000 mg | DELAYED_RELEASE_CAPSULE | Freq: Every day | ORAL | Status: DC

## 2015-06-05 MED ORDER — CYCLOBENZAPRINE HCL 5 MG PO TABS: ORAL_TABLET | ORAL | Status: DC

## 2015-06-05 MED ORDER — ATORVASTATIN CALCIUM 10 MG PO TABS: 10.0000 mg | ORAL_TABLET | Freq: Every day | ORAL | Status: DC

## 2015-06-05 MED ORDER — METFORMIN HCL 500 MG PO TABS: 1000.0000 mg | ORAL_TABLET | Freq: Two times a day (BID) | ORAL | Status: DC

## 2015-06-05 NOTE — Progress Notes (Signed)
Subjective:    HPI  C/o stress, working 65 h/wk  C/o fatigue and insomnia  First cousin - colon ca  F/u fatigue and SOB  Not better  The patient needs to address  chronic hypertension that has been well controlled with medicines; to address h/o CHF controlled with medicines as well; and to address type 2 chronic diabetes, controlled with medical treatment and diet.  BP Readings from Last 3 Encounters:  06/05/15 140/80  05/30/15 125/72  02/21/15 160/90   Wt Readings from Last 3 Encounters:  06/05/15 239 lb (108.41 kg)  05/30/15 235 lb 14.4 oz (107.004 kg)  02/21/15 237 lb 4 oz (107.616 kg)      Review of Systems  Constitutional: Positive for fatigue. Negative for fever, chills, diaphoresis, activity change, appetite change and unexpected weight change.  HENT: Negative for congestion, dental problem, ear pain, hearing loss, mouth sores, postnasal drip, sinus pressure, sneezing, sore throat and voice change.   Eyes: Negative for pain and visual disturbance.  Respiratory: Negative for cough, chest tightness, wheezing and stridor.   Cardiovascular: Negative for chest pain, palpitations and leg swelling.  Gastrointestinal: Negative for nausea, vomiting, abdominal pain, blood in stool, abdominal distention and rectal pain.  Genitourinary: Positive for vaginal bleeding and menstrual problem. Negative for dysuria, hematuria, decreased urine volume, vaginal discharge, difficulty urinating and vaginal pain.  Musculoskeletal: Negative for back pain, joint swelling, gait problem and neck pain.  Skin: Negative for color change, rash and wound.  Neurological: Negative for dizziness, tremors, syncope, speech difficulty and light-headedness.  Hematological: Negative for adenopathy.  Psychiatric/Behavioral: Positive for sleep disturbance. Negative for suicidal ideas, hallucinations, behavioral problems, confusion, dysphoric mood and decreased concentration. The patient is nervous/anxious.  The patient is not hyperactive.        Objective:   Physical Exam  Constitutional: She appears well-developed. No distress.  Obese   HENT:  Head: Normocephalic.  Right Ear: External ear normal.  Left Ear: External ear normal.  Nose: Nose normal.  Mouth/Throat: Oropharynx is clear and moist.  Eyes: Conjunctivae are normal. Pupils are equal, round, and reactive to light. Right eye exhibits no discharge. Left eye exhibits no discharge.  Neck: Normal range of motion. Neck supple. No JVD present. No tracheal deviation present. No thyromegaly present.  Cardiovascular: Normal rate, regular rhythm and normal heart sounds.   Pulmonary/Chest: No stridor. No respiratory distress. She has no wheezes.  Abdominal: Soft. Bowel sounds are normal. She exhibits no distension and no mass. There is no tenderness. There is no rebound and no guarding.  Musculoskeletal: She exhibits no edema or tenderness.  Lymphadenopathy:    She has no cervical adenopathy.  Neurological: She displays normal reflexes. No cranial nerve deficit. She exhibits normal muscle tone. Coordination normal.  Skin: No rash noted. No erythema.  pale  Psychiatric: Her behavior is normal. Judgment and thought content normal.  R lat epic is tender R post dist thigh is tender    Lab Results  Component Value Date   WBC 8.5 05/30/2015   HGB 13.6 05/30/2015   HCT 41.4 05/30/2015   PLT 209 05/30/2015   GLUCOSE 223* 05/30/2015   CHOL 169 09/29/2014   TRIG * 09/29/2014    485.0 Triglyceride is over 400; calculations on Lipids are invalid.   HDL 28.00* 09/29/2014   LDLDIRECT 72.3 09/29/2014   ALT 17 05/30/2015   AST 16 05/30/2015   NA 137 05/30/2015   K 4.4 05/30/2015   CL 100 01/30/2015   CREATININE  0.7 05/30/2015   BUN 11.9 05/30/2015   CO2 23 05/30/2015   TSH 1.81 02/28/2013   HGBA1C 8.6* 01/30/2015           Assessment & Plan:

## 2015-06-05 NOTE — Assessment & Plan Note (Signed)
Chronic 6/16 worse: Wellbutrin XL, Cymbalta Xanax prn  Potential benefits of a long term benzodiazepines  use as well as potential risks  and complications were explained to the patient and were aknowledged. Psychol ref

## 2015-06-05 NOTE — Assessment & Plan Note (Addendum)
Chronic On Metformin, Invokana [facility]

## 2015-06-05 NOTE — Progress Notes (Signed)
Pre visit review using our clinic review tool, if applicable. No additional management support is needed unless otherwise documented below in the visit note. 

## 2015-06-07 ENCOUNTER — Encounter: Payer: Self-pay | Admitting: Internal Medicine

## 2015-06-07 NOTE — Assessment & Plan Note (Signed)
Chronic Alprazolam prn

## 2015-06-18 ENCOUNTER — Other Ambulatory Visit: Payer: Self-pay

## 2015-07-03 ENCOUNTER — Other Ambulatory Visit: Payer: Self-pay | Admitting: Internal Medicine

## 2015-07-05 ENCOUNTER — Other Ambulatory Visit: Payer: Self-pay | Admitting: Internal Medicine

## 2015-09-06 ENCOUNTER — Other Ambulatory Visit (INDEPENDENT_AMBULATORY_CARE_PROVIDER_SITE_OTHER): Payer: 59

## 2015-09-06 DIAGNOSIS — IMO0002 Reserved for concepts with insufficient information to code with codable children: Secondary | ICD-10-CM

## 2015-09-06 DIAGNOSIS — E1165 Type 2 diabetes mellitus with hyperglycemia: Secondary | ICD-10-CM | POA: Diagnosis not present

## 2015-09-06 LAB — BASIC METABOLIC PANEL
BUN: 12 mg/dL (ref 6–23)
CO2: 27 mEq/L (ref 19–32)
CREATININE: 0.59 mg/dL (ref 0.40–1.20)
Calcium: 9.4 mg/dL (ref 8.4–10.5)
Chloride: 102 mEq/L (ref 96–112)
GFR: 116.27 mL/min (ref >60.00)
GLUCOSE: 155 mg/dL — AB (ref 70–99)
Potassium: 4.4 mEq/L (ref 3.5–5.1)
Sodium: 137 mEq/L (ref 135–145)

## 2015-09-06 LAB — HEMOGLOBIN A1C: HEMOGLOBIN A1C: 7.1 % — AB (ref 4.6–6.5)

## 2015-09-13 ENCOUNTER — Telehealth: Payer: Self-pay | Admitting: *Deleted

## 2015-09-13 MED ORDER — DULAGLUTIDE 1.5 MG/0.5ML ~~LOC~~ SOAJ: SUBCUTANEOUS | Status: DC

## 2015-09-13 NOTE — Telephone Encounter (Signed)
Receive call pt states she is needing refills on her trulicity. Pharmacy has sent request, but haven't gotten approval. Verified pharmacy inform will send to [facility]...Johny Chess

## 2015-10-01 ENCOUNTER — Other Ambulatory Visit: Payer: Self-pay | Admitting: *Deleted

## 2015-10-01 DIAGNOSIS — E1165 Type 2 diabetes mellitus with hyperglycemia: Secondary | ICD-10-CM

## 2015-10-02 ENCOUNTER — Other Ambulatory Visit: Payer: 59

## 2015-10-02 ENCOUNTER — Telehealth: Payer: Self-pay | Admitting: Oncology

## 2015-10-02 ENCOUNTER — Ambulatory Visit: Payer: 59 | Admitting: Oncology

## 2015-10-02 NOTE — Telephone Encounter (Signed)
Patient left message this morning to cx appointments today due to car trouble. Per patient will call back to r/s.

## 2015-10-30 ENCOUNTER — Telehealth: Payer: Self-pay | Admitting: Oncology

## 2015-10-30 NOTE — Telephone Encounter (Signed)
Patient call to r/s October lab/fu. Gave patient new appointment for 12/15.

## 2015-12-06 ENCOUNTER — Other Ambulatory Visit (HOSPITAL_BASED_OUTPATIENT_CLINIC_OR_DEPARTMENT_OTHER): Payer: 59

## 2015-12-06 ENCOUNTER — Telehealth: Payer: Self-pay | Admitting: Oncology

## 2015-12-06 ENCOUNTER — Ambulatory Visit (HOSPITAL_BASED_OUTPATIENT_CLINIC_OR_DEPARTMENT_OTHER): Payer: 59 | Admitting: Oncology

## 2015-12-06 VITALS — BP 130/76 | HR 104 | Temp 98.3°F | Resp 18 | Ht 68.0 in | Wt 237.2 lb

## 2015-12-06 DIAGNOSIS — E1165 Type 2 diabetes mellitus with hyperglycemia: Secondary | ICD-10-CM

## 2015-12-06 DIAGNOSIS — D509 Iron deficiency anemia, unspecified: Secondary | ICD-10-CM

## 2015-12-06 DIAGNOSIS — D500 Iron deficiency anemia secondary to blood loss (chronic): Secondary | ICD-10-CM

## 2015-12-06 DIAGNOSIS — D649 Anemia, unspecified: Secondary | ICD-10-CM

## 2015-12-06 LAB — CBC WITH DIFFERENTIAL/PLATELET
BASO%: 0.5 % (ref 0.0–2.0)
Basophils Absolute: 0.1 10*3/uL (ref 0.0–0.1)
EOS%: 2 % (ref 0.0–7.0)
Eosinophils Absolute: 0.2 10*3/uL (ref 0.0–0.5)
HCT: 41 % (ref 34.8–46.6)
HGB: 13.4 g/dL (ref 11.6–15.9)
LYMPH%: 27 % (ref 14.0–49.7)
MCH: 27.8 pg (ref 25.1–34.0)
MCHC: 32.7 g/dL (ref 31.5–36.0)
MCV: 85.1 fL (ref 79.5–101.0)
MONO#: 0.6 10*3/uL (ref 0.1–0.9)
MONO%: 5.7 % (ref 0.0–14.0)
NEUT%: 64.8 % (ref 38.4–76.8)
NEUTROS ABS: 6.4 10*3/uL (ref 1.5–6.5)
PLATELETS: 190 10*3/uL (ref 145–400)
RBC: 4.82 10*6/uL (ref 3.70–5.45)
RDW: 14.5 % (ref 11.2–14.5)
WBC: 9.9 10*3/uL (ref 3.9–10.3)
lymph#: 2.7 10*3/uL (ref 0.9–3.3)

## 2015-12-06 LAB — IRON AND TIBC
%SAT: 12 % — ABNORMAL LOW (ref 21–57)
Iron: 43 ug/dL (ref 41–142)
TIBC: 348 ug/dL (ref 236–444)
UIBC: 305 ug/dL (ref 120–384)

## 2015-12-06 LAB — COMPREHENSIVE METABOLIC PANEL
ALT: 21 U/L (ref 0–55)
AST: 17 U/L (ref 5–34)
Albumin: 3.8 g/dL (ref 3.5–5.0)
Alkaline Phosphatase: 99 U/L (ref 40–150)
Anion Gap: 11 mEq/L (ref 3–11)
BILIRUBIN TOTAL: 0.63 mg/dL (ref 0.20–1.20)
BUN: 7.2 mg/dL (ref 7.0–26.0)
CHLORIDE: 103 meq/L (ref 98–109)
CO2: 21 meq/L — AB (ref 22–29)
CREATININE: 0.7 mg/dL (ref 0.6–1.1)
Calcium: 9.1 mg/dL (ref 8.4–10.4)
EGFR: >90 mL/min/{1.73_m2} (ref >90)
GLUCOSE: 181 mg/dL — AB (ref 70–140)
Potassium: 3.7 mEq/L (ref 3.5–5.1)
Sodium: 135 mEq/L — ABNORMAL LOW (ref 136–145)
TOTAL PROTEIN: 7.3 g/dL (ref 6.4–8.3)

## 2015-12-06 LAB — FERRITIN: Ferritin: 12 ng/ml (ref 9–269)

## 2015-12-06 NOTE — Progress Notes (Signed)
Hematology and Oncology Follow Up Visit  [name] [name] [id number] 1966/11/03 49 y.o. 12/06/2015 1:41 PM [name] [name], MDPlotnikov, [name], MD   Principle Diagnosis: 49 year old woman diagnosed with iron deficiency anemia after she presented in July of 2014 with a hemoglobin of 8.4. Her iron studies showed iron level of 25 and ferritin of 5.   Prior Therapy: She is status post IV iron in the form of Feraheme given on 06/22/2013, 02/03/2014, and 02/09/15.   Current therapy: Observation and followup. She is intolerant to oral iron.  Interim History: [name] presents today for routine followup. Since the last visit, she reports no complaints. She has not reported any fatigue or tiredness. Has not reported any decline in her energy her performance status. She continues to be able to work full time. Her menstrual cycles are more sporadic. She has follow-up with her gynecologist on a regular basis. She is not reporting any symptoms of chest pain or shortness of breath. Has not reported any more hematochezia or melena.    She has not reported any headaches or blurred vision or double vision. She does not report any syncope or seizures. Does not report any nausea or vomiting or abdominal pain. She does not report any neuropathy. She does not report any hemoptysis or hematemesis. She does not report any genitourinary bleeding. He does not report any skeletal pain. Rest of her review of systems unremarkable.  Medications: I have reviewed the patient's current medications.  Current Outpatient Prescriptions  Medication Sig Dispense Refill  . atorvastatin (LIPITOR) 10 MG tablet Take 1 tablet (10 mg total) by mouth daily. 90 tablet 3  . buPROPion (WELLBUTRIN XL) 150 MG 24 hr tablet Take 1 tablet (150 mg total) by mouth daily. 90 tablet 3  . canagliflozin (INVOKANA) 300 MG TABS tablet Take 300 mg by mouth daily. 90 tablet 3  . Cholecalciferol (EQL VITAMIN D3) 1000 UNITS tablet Take 1,000 Units by  mouth daily.      . Dulaglutide (TRULICITY) 1.5 0000000 SOPN Use 1 pen weekly 4 pen 5  . metFORMIN (GLUCOPHAGE) 500 MG tablet Take 2 tablets (1,000 mg total) by mouth 2 (two) times daily with a meal. 360 tablet 3   No current facility-administered medications for this visit.     Allergies: No Known Allergies  Past Medical History, Surgical history, Social history, and Family History were reviewed and updated.   Physical Exam: Blood pressure 130/76, pulse 104, temperature 98.3 F (36.8 C), temperature source Oral, resp. rate 18, height 5\' 8"  (1.727 m), weight 237 lb 3.2 oz (107.593 kg), SpO2 100 %. ECOG: 0 General appearance: alert and cooperative appeared without distress. Head: Normocephalic, without obvious abnormality no oral ulcers or lesions. Neck: no adenopathy Lymph nodes: Cervical, supraclavicular, and axillary nodes normal. Heart:regular rate and rhythm, S1, S2 normal, no murmur, click, rub or gallop Lung:chest clear, no wheezing, no shifting dullness. Abdomen: soft, non-tender, no ascites or shifting dullness EXT:no erythema, induration, or nodules no edema.   Lab Results: Lab Results  Component Value Date   WBC 9.9 12/06/2015   HGB 13.4 12/06/2015   HCT 41.0 12/06/2015   MCV 85.1 12/06/2015   PLT 190 12/06/2015     Chemistry      Component Value Date/Time   NA 137 09/06/2015 1002   NA 137 05/30/2015 0855   K 4.4 09/06/2015 1002   K 4.4 05/30/2015 0855   CL 102 09/06/2015 1002   CO2 27 09/06/2015 1002   CO2 23 05/30/2015  0855   BUN 12 09/06/2015 1002   BUN 11.9 05/30/2015 0855   CREATININE 0.59 09/06/2015 1002   CREATININE 0.7 05/30/2015 0855      Component Value Date/Time   CALCIUM 9.4 09/06/2015 1002   CALCIUM 8.8 05/30/2015 0855   ALKPHOS 92 05/30/2015 0855   ALKPHOS 81 02/28/2013 1215   AST 16 05/30/2015 0855   AST 30 02/28/2013 1215   ALT 17 05/30/2015 0855   ALT 34 02/28/2013 1215   BILITOT 0.84 05/30/2015 0855   BILITOT 0.8 02/28/2013  1215        Impression and Plan:  49 year old woman with the following issues:   1. Microcytic hypochromic anemia with documented findings of iron deficiency anemia. She is status post Feraheme infusion most recently in February 2016. She tolerated it very well. Her hemoglobin is normal today at 13.4. I will check her iron stores and schedule IV iron infusion before the end of the year if her iron studies are low. If her iron studies are within normal range we'll continue with observation and surveillance and follow-up in 6 months.  2. Heavy menstrual cycles: She is seen by her gynecologist for this issue. Seems to have improved since last visit.  3. Follow-up: In 6 months   Jaquetta Currier, DNP, AGPCNP-BC, AOCNP 12/15/20161:41 PM

## 2015-12-06 NOTE — Telephone Encounter (Signed)
Gave and printed appt sched and avs fo rpt for DEC and May 2017

## 2015-12-07 ENCOUNTER — Telehealth: Payer: Self-pay | Admitting: *Deleted

## 2015-12-07 LAB — HEMOGLOBIN A1C
HEMOGLOBIN A1C: 6.8 % — AB (ref <5.7)
Mean Plasma Glucose: 148 mg/dL — ABNORMAL HIGH (ref <117)

## 2015-12-07 NOTE — Telephone Encounter (Signed)
-----   Message from [name], MD sent at 12/07/2015  8:03 AM EST ----- Please let her know that her iron saturations is low and will need the iron infusions (already scheduled).

## 2015-12-07 NOTE — Telephone Encounter (Signed)
Spoke with patient. Per dr Alen Blew, patient's iron saturation is low and she will need the iron infusions. appts already scheduled and patient verbalized understanding.

## 2015-12-10 ENCOUNTER — Telehealth: Payer: Self-pay | Admitting: *Deleted

## 2015-12-10 NOTE — Telephone Encounter (Signed)
Spoke with patient, per dr Alen Blew, iron saturation is low and she needs iv iron. Already scheduled. Confirmed appt day and time.

## 2015-12-10 NOTE — Telephone Encounter (Signed)
-----   Message from [name], MD sent at 12/10/2015  8:56 AM EST ----- Please let her know iron saturation is low and needs IV iron (already scheduled).

## 2015-12-12 ENCOUNTER — Ambulatory Visit (HOSPITAL_BASED_OUTPATIENT_CLINIC_OR_DEPARTMENT_OTHER): Payer: 59

## 2015-12-12 VITALS — BP 151/80 | HR 90 | Temp 98.0°F | Resp 20

## 2015-12-12 DIAGNOSIS — D509 Iron deficiency anemia, unspecified: Secondary | ICD-10-CM

## 2015-12-12 MED ORDER — SODIUM CHLORIDE 0.9 % IV SOLN
Freq: Once | INTRAVENOUS | Status: AC
  Administered 2015-12-12: 09:00:00 via INTRAVENOUS

## 2015-12-12 MED ORDER — SODIUM CHLORIDE 0.9 % IV SOLN
510.0000 mg | Freq: Once | INTRAVENOUS | Status: AC
  Administered 2015-12-12: 510 mg via INTRAVENOUS
  Filled 2015-12-12: qty 17

## 2015-12-12 NOTE — Progress Notes (Signed)
0920-Pt refused to stay for 30 minute post Feraheme observation.  She is without complaints at this time.

## 2015-12-12 NOTE — Patient Instructions (Signed)

## 2015-12-19 ENCOUNTER — Ambulatory Visit (HOSPITAL_BASED_OUTPATIENT_CLINIC_OR_DEPARTMENT_OTHER): Payer: 59

## 2015-12-19 VITALS — BP 147/76 | HR 90 | Temp 98.7°F | Resp 18

## 2015-12-19 DIAGNOSIS — D509 Iron deficiency anemia, unspecified: Secondary | ICD-10-CM

## 2015-12-19 MED ORDER — SODIUM CHLORIDE 0.9 % IV SOLN
Freq: Once | INTRAVENOUS | Status: AC
  Administered 2015-12-19: 09:00:00 via INTRAVENOUS

## 2015-12-19 MED ORDER — SODIUM CHLORIDE 0.9 % IV SOLN
510.0000 mg | Freq: Once | INTRAVENOUS | Status: AC
  Administered 2015-12-19: 510 mg via INTRAVENOUS
  Filled 2015-12-19: qty 17

## 2015-12-19 NOTE — Progress Notes (Signed)
Pt refused to stay for 30 min observation period.  Vitals obtained and stable.  Pt discharged ambulatory without any complaints.

## 2015-12-19 NOTE — Patient Instructions (Signed)

## 2016-01-24 ENCOUNTER — Telehealth: Payer: Self-pay

## 2016-01-24 NOTE — Telephone Encounter (Signed)
PA for Invokana initiated via CoverMyMeds, [employer]

## 2016-01-29 NOTE — Telephone Encounter (Signed)
Do they cover Jardiance Thx

## 2016-01-29 NOTE — Telephone Encounter (Signed)
Invokana PA was denied, please advise on alternative medication thanks

## 2016-01-30 MED ORDER — EMPAGLIFLOZIN 25 MG PO TABS: 25.0000 mg | ORAL_TABLET | Freq: Every day | ORAL | Status: DC

## 2016-01-30 NOTE — Telephone Encounter (Signed)
Yes, [name] [name] is covered. Thanks

## 2016-01-30 NOTE — Telephone Encounter (Signed)
[place] Thx

## 2016-01-31 NOTE — Telephone Encounter (Signed)
Left message on machine for pt to return my call  

## 2016-02-04 NOTE — Telephone Encounter (Signed)
Left message on machine for pt to return my call. Pharmacy aware of medication change

## 2016-03-20 ENCOUNTER — Ambulatory Visit (INDEPENDENT_AMBULATORY_CARE_PROVIDER_SITE_OTHER): Payer: PRIVATE HEALTH INSURANCE | Admitting: Internal Medicine

## 2016-03-20 ENCOUNTER — Other Ambulatory Visit (INDEPENDENT_AMBULATORY_CARE_PROVIDER_SITE_OTHER): Payer: PRIVATE HEALTH INSURANCE

## 2016-03-20 ENCOUNTER — Encounter: Payer: Self-pay | Admitting: Internal Medicine

## 2016-03-20 VITALS — BP 140/74 | HR 92 | Temp 97.8°F | Wt 237.0 lb

## 2016-03-20 DIAGNOSIS — L732 Hidradenitis suppurativa: Secondary | ICD-10-CM | POA: Insufficient documentation

## 2016-03-20 DIAGNOSIS — L309 Dermatitis, unspecified: Secondary | ICD-10-CM | POA: Diagnosis not present

## 2016-03-20 DIAGNOSIS — E1165 Type 2 diabetes mellitus with hyperglycemia: Secondary | ICD-10-CM

## 2016-03-20 LAB — BASIC METABOLIC PANEL
BUN: 10 mg/dL (ref 6–23)
CO2: 28 mEq/L (ref 19–32)
Calcium: 9.5 mg/dL (ref 8.4–10.5)
Chloride: 103 mEq/L (ref 96–112)
Creatinine, Ser: 0.59 mg/dL (ref 0.40–1.20)
GFR: 116 mL/min (ref >60.00)
GLUCOSE: 158 mg/dL — AB (ref 70–99)
POTASSIUM: 4.3 meq/L (ref 3.5–5.1)
SODIUM: 137 meq/L (ref 135–145)

## 2016-03-20 LAB — HEMOGLOBIN A1C: HEMOGLOBIN A1C: 6.8 % — AB (ref 4.6–6.5)

## 2016-03-20 MED ORDER — TRIAMCINOLONE ACETONIDE 0.5 % EX OINT: 1.0000 "application " | TOPICAL_OINTMENT | Freq: Two times a day (BID) | CUTANEOUS | Status: DC

## 2016-03-20 MED ORDER — DOXYCYCLINE HYCLATE 100 MG PO TABS: 100.0000 mg | ORAL_TABLET | Freq: Two times a day (BID) | ORAL | Status: DC

## 2016-03-20 NOTE — Assessment & Plan Note (Addendum)
Labs On Metformin, Invokana, Trulicity

## 2016-03-20 NOTE — Progress Notes (Signed)
Subjective:  Patient ID: [name] [name], female    DOB: [birth year]  Age: 50 y.o. MRN: [id number]  CC: No chief complaint on file.   HPI [name] [name] presents for R arm rash. C/o chronic hydradenitis in the R axilla - draining   Outpatient Prescriptions Prior to Visit  Medication Sig Dispense Refill  . atorvastatin (LIPITOR) 10 MG tablet Take 1 tablet (10 mg total) by mouth daily. 90 tablet 3  . buPROPion (WELLBUTRIN XL) 150 MG 24 hr tablet Take 1 tablet (150 mg total) by mouth daily. 90 tablet 3  . Cholecalciferol (EQL VITAMIN D3) 1000 UNITS tablet Take 1,000 Units by mouth daily.      . Dulaglutide (TRULICITY) 1.5 0000000 SOPN Use 1 pen weekly 4 pen 5  . empagliflozin (JARDIANCE) 25 MG TABS tablet Take 25 mg by mouth daily. 30 tablet 11  . metFORMIN (GLUCOPHAGE) 500 MG tablet Take 2 tablets (1,000 mg total) by mouth 2 (two) times daily with a meal. 360 tablet 3   No facility-administered medications prior to visit.    ROS Review of Systems  Objective:  BP 140/74 mmHg  Pulse 92  Temp(Src) 97.8 F (36.6 C) (Oral)  Wt 237 lb (107.502 kg)  SpO2 96%  BP Readings from Last 3 Encounters:  03/20/16 140/74  12/19/15 147/76  12/12/15 151/80    Wt Readings from Last 3 Encounters:  03/20/16 237 lb (107.502 kg)  12/06/15 237 lb 3.2 oz (107.593 kg)  06/05/15 239 lb (108.41 kg)    Physical Exam exema Hydradenitis Lab Results  Component Value Date   WBC 9.9 12/06/2015   HGB 13.4 12/06/2015   HCT 41.0 12/06/2015   PLT 190 12/06/2015   GLUCOSE 158* 03/20/2016   CHOL 169 09/29/2014   TRIG * 09/29/2014    485.0 Triglyceride is over 400; calculations on Lipids are invalid.   HDL 28.00* 09/29/2014   LDLDIRECT 72.3 09/29/2014   ALT 21 12/06/2015   AST 17 12/06/2015   NA 137 03/20/2016   K 4.3 03/20/2016   CL 103 03/20/2016   CREATININE 0.59 03/20/2016   BUN 10 03/20/2016   CO2 28 03/20/2016   TSH 1.81 02/28/2013   HGBA1C 6.8* 03/20/2016    Mm Digital  Screening  04/06/2012  DG SCREEN MAMMOGRAM BILATERAL Bilateral CC and MLO view(s) were taken. DIGITAL SCREENING MAMMOGRAM WITH CAD: The breast tissue is almost entirely fatty.  No masses or malignant type calcifications are identified. Images were processed with CAD. IMPRESSION: No specific mammographic evidence of malignancy.  Next screening mammogram is recommended in one year. A result letter of this screening mammogram will be mailed directly to the patient. ASSESSMENT: Negative - BI-RADS 1 Screening mammogram in 1 year. ,   Assessment & Plan:   Diagnoses and all orders for this visit:  Hydradenitis -     Ambulatory referral to Dermatology  Eczema  Uncontrolled type 2 diabetes mellitus with hyperglycemia, unspecified long term insulin use status ([place]) -     Basic metabolic panel; Future -     Hemoglobin A1c; Future  Other orders -     triamcinolone ointment (KENALOG) 0.5 %; Apply 1 application topically 2 (two) times daily. -     doxycycline (VIBRA-TABS) 100 MG tablet; Take 1 tablet (100 mg total) by mouth 2 (two) times daily.  I am having [name] start on triamcinolone ointment and doxycycline. I am also having her maintain her Cholecalciferol, metFORMIN, atorvastatin, buPROPion, Dulaglutide, empagliflozin, citalopram, and omeprazole.  Meds ordered this encounter  Medications  . citalopram (CELEXA) 20 MG tablet    Sig: Take 1 tablet by mouth daily.    Refill:  3  . omeprazole (PRILOSEC) 40 MG capsule    Sig: Take 1 capsule by mouth daily.    Refill:  3  . triamcinolone ointment (KENALOG) 0.5 %    Sig: Apply 1 application topically 2 (two) times daily.    Dispense:  30 g    Refill:  3  . doxycycline (VIBRA-TABS) 100 MG tablet    Sig: Take 1 tablet (100 mg total) by mouth 2 (two) times daily.    Dispense:  20 tablet    Refill:  2     Follow-up: Return in about 3 months (around 06/20/2016) for a follow-up visit.  [name], MD

## 2016-03-20 NOTE — Assessment & Plan Note (Signed)
Triamcinolone oint

## 2016-03-20 NOTE — Assessment & Plan Note (Signed)
3/17 R Doxy Derm ref

## 2016-03-20 NOTE — Progress Notes (Signed)
Pre visit review using our clinic review tool, if applicable. No additional management support is needed unless otherwise documented below in the visit note. 

## 2016-03-21 ENCOUNTER — Other Ambulatory Visit: Payer: Self-pay | Admitting: Internal Medicine

## 2016-03-26 ENCOUNTER — Encounter: Payer: Self-pay | Admitting: Internal Medicine

## 2016-05-01 ENCOUNTER — Encounter: Payer: Self-pay | Admitting: Oncology

## 2016-05-01 ENCOUNTER — Ambulatory Visit (HOSPITAL_BASED_OUTPATIENT_CLINIC_OR_DEPARTMENT_OTHER): Payer: PRIVATE HEALTH INSURANCE | Admitting: Oncology

## 2016-05-01 ENCOUNTER — Other Ambulatory Visit (HOSPITAL_BASED_OUTPATIENT_CLINIC_OR_DEPARTMENT_OTHER): Payer: PRIVATE HEALTH INSURANCE

## 2016-05-01 ENCOUNTER — Telehealth: Payer: Self-pay | Admitting: Oncology

## 2016-05-01 ENCOUNTER — Telehealth: Payer: Self-pay | Admitting: *Deleted

## 2016-05-01 VITALS — BP 143/68 | HR 97 | Temp 97.8°F | Resp 18 | Ht 68.0 in | Wt 243.9 lb

## 2016-05-01 DIAGNOSIS — D500 Iron deficiency anemia secondary to blood loss (chronic): Secondary | ICD-10-CM

## 2016-05-01 DIAGNOSIS — D509 Iron deficiency anemia, unspecified: Secondary | ICD-10-CM

## 2016-05-01 LAB — CBC WITH DIFFERENTIAL/PLATELET
BASO%: 1.1 % (ref 0.0–2.0)
Basophils Absolute: 0.1 10*3/uL (ref 0.0–0.1)
EOS%: 2.3 % (ref 0.0–7.0)
Eosinophils Absolute: 0.2 10*3/uL (ref 0.0–0.5)
HCT: 42.6 % (ref 34.8–46.6)
HEMOGLOBIN: 13.7 g/dL (ref 11.6–15.9)
LYMPH#: 1.6 10*3/uL (ref 0.9–3.3)
LYMPH%: 21.4 % (ref 14.0–49.7)
MCH: 28.1 pg (ref 25.1–34.0)
MCHC: 32.2 g/dL (ref 31.5–36.0)
MCV: 87.4 fL (ref 79.5–101.0)
MONO#: 0.4 10*3/uL (ref 0.1–0.9)
MONO%: 4.6 % (ref 0.0–14.0)
NEUT#: 5.4 10*3/uL (ref 1.5–6.5)
NEUT%: 70.6 % (ref 38.4–76.8)
Platelets: 178 10*3/uL (ref 145–400)
RBC: 4.88 10*6/uL (ref 3.70–5.45)
RDW: 13.8 % (ref 11.2–14.5)
WBC: 7.6 10*3/uL (ref 3.9–10.3)
nRBC: 0 % (ref 0–0)

## 2016-05-01 LAB — IRON AND TIBC
%SAT: 20 % — AB (ref 21–57)
IRON: 60 ug/dL (ref 41–142)
TIBC: 295 ug/dL (ref 236–444)
UIBC: 235 ug/dL (ref 120–384)

## 2016-05-01 LAB — FERRITIN: FERRITIN: 46 ng/mL (ref 9–269)

## 2016-05-01 NOTE — Telephone Encounter (Signed)
-----   Message from [name], MD sent at 05/01/2016 10:40 AM EDT ----- Please let her know her iron is OK today and will not need IV Iron.

## 2016-05-01 NOTE — Telephone Encounter (Signed)
Attempt to notify pt of iron study results, unable to reach lmovm for pt iron is ok and she will not need IV Iron. Pt to call office with any concerns.

## 2016-05-01 NOTE — Progress Notes (Signed)
Hematology and Oncology Follow Up Visit  [name] [name] [id number] 1966-03-03 50 y.o. 05/01/2016 9:11 AM [name] [name], MDPlotnikov, [name], MD   Principle Diagnosis: 50 year old woman diagnosed with iron deficiency anemia after she presented in July of 2014 with a hemoglobin of 8.4. Her iron studies showed iron level of 25 and ferritin of 5.   Prior Therapy: She is status post IV iron in the form of Feraheme given on multiple occasions most infusion was given in December 2016.  Current therapy: Observation and followup. She is intolerant to oral iron.  Interim History: [name] presents today for routine followup. Since the last visit, she received intravenous iron in December 2016 and tolerated it well. She noticed significant improvement in her symptoms and able to perform all activities of daily living. She continues to work full-time without any decline. Most recently however, she is reporting slight increase in her fatigue and decline in her energy. She denied any hematochezia or melena. She continues to have regular menstrual cycles and not necessarily heavy.   She has not reported any headaches or blurred vision or double vision. She does not report any syncope or seizures. Does not report any nausea or vomiting or abdominal pain. She does not report any neuropathy. She does not report any hemoptysis or hematemesis. She does not report any genitourinary bleeding. He does not report any skeletal pain. Rest of her review of systems unremarkable.  Medications: I have reviewed the patient's current medications.  Current Outpatient Prescriptions  Medication Sig Dispense Refill  . buPROPion (WELLBUTRIN SR) 150 MG 12 hr tablet TK 1 T PO BID  3  . buPROPion (WELLBUTRIN XL) 150 MG 24 hr tablet Take 1 tablet (150 mg total) by mouth daily. 90 tablet 3  . Cholecalciferol (EQL VITAMIN D3) 1000 UNITS tablet Take 1,000 Units by mouth daily. Reported on 05/01/2016    . citalopram (CELEXA) 20 MG  tablet Take 1 tablet by mouth daily.  3  . doxycycline (VIBRA-TABS) 100 MG tablet Take 1 tablet (100 mg total) by mouth 2 (two) times daily. 20 tablet 2  . empagliflozin (JARDIANCE) 25 MG TABS tablet Take 25 mg by mouth daily. 30 tablet 11  . metFORMIN (GLUCOPHAGE) 500 MG tablet Take 2 tablets (1,000 mg total) by mouth 2 (two) times daily with a meal. 360 tablet 3  . omeprazole (PRILOSEC) 40 MG capsule Take 1 capsule by mouth daily.  3  . triamcinolone ointment (KENALOG) 0.5 % Apply 1 application topically 2 (two) times daily. 30 g 3  . TRULICITY 1.5 0000000 SOPN USE ONE(1) PEN WEEKLY AS DIRECTED 2 mL 3  . atorvastatin (LIPITOR) 10 MG tablet Take 1 tablet (10 mg total) by mouth daily. (Patient not taking: Reported on 05/01/2016) 90 tablet 3   No current facility-administered medications for this visit.     Allergies: No Known Allergies  Past Medical History, Surgical history, Social history, and Family History were reviewed and updated.   Physical Exam: Blood pressure 143/68, pulse 97, temperature 97.8 F (36.6 C), temperature source Oral, resp. rate 18, height 5\' 8"  (1.727 m), weight 243 lb 14.4 oz (110.632 kg), SpO2 98 %. ECOG: 0 General appearance: alert and cooperative well-appearing woman without distress. Head: Normocephalic, without obvious abnormality no oral thrush noted. Neck: no adenopathy Lymph nodes: Cervical, supraclavicular, and axillary nodes normal. Heart:regular rate and rhythm, S1, S2 normal, no murmur, click, rub or gallop Lung:chest clear, no wheezing, no shifting dullness. Abdomen: soft, non-tender, no ascites or shifting  dullness no rebound or guarding. EXT:no erythema, induration, or nodules no edema.   Lab Results: Lab Results  Component Value Date   WBC 7.6 05/01/2016   HGB 13.7 05/01/2016   HCT 42.6 05/01/2016   MCV 87.4 05/01/2016   PLT 178 05/01/2016     Chemistry      Component Value Date/Time   NA 137 03/20/2016 0954   NA 135* 12/06/2015  1316   K 4.3 03/20/2016 0954   K 3.7 12/06/2015 1316   CL 103 03/20/2016 0954   CO2 28 03/20/2016 0954   CO2 21* 12/06/2015 1316   BUN 10 03/20/2016 0954   BUN 7.2 12/06/2015 1316   CREATININE 0.59 03/20/2016 0954   CREATININE 0.7 12/06/2015 1316      Component Value Date/Time   CALCIUM 9.5 03/20/2016 0954   CALCIUM 9.1 12/06/2015 1316   ALKPHOS 99 12/06/2015 1316   ALKPHOS 81 02/28/2013 1215   AST 17 12/06/2015 1316   AST 30 02/28/2013 1215   ALT 21 12/06/2015 1316   ALT 34 02/28/2013 1215   BILITOT 0.63 12/06/2015 1316   BILITOT 0.8 02/28/2013 1215     Results for [name] [name] (MRN [id number]) as of 05/01/2016 09:04  Ref. Range 12/06/2015 13:16  Iron Latest Ref Range: 41-142 ug/dL 43  UIBC Latest Ref Range: 120-384 ug/dL 305  TIBC Latest Ref Range: 236-444 ug/dL 348  %SAT Latest Ref Range: 21-57 % 12 (L)  Ferritin Latest Ref Range: 9-269 ng/ml 12     Impression and Plan:  50 year old woman with the following issues:   1. Microcytic hypochromic anemia with documented findings of iron deficiency anemia. She is status post Feraheme infusion on 4 occasions most recently in December 2016.   Her hemoglobin is normal today at 13.7 and her iron studies are currently pending. We will continue to monitor her iron studies closely and supplemental intravenous iron upon deficiency.  2. Heavy menstrual cycles: She is seen by her gynecologist for this issue. Seems to have improved since last visit.  3. Follow-up: In 6 months   [id number], MD 5/11/20179:11 AM

## 2016-05-01 NOTE — Telephone Encounter (Signed)
Gave and pritned appt sched for NOV

## 2016-06-08 ENCOUNTER — Encounter: Payer: Self-pay | Admitting: Internal Medicine

## 2016-06-11 NOTE — Telephone Encounter (Signed)
-----   Message from Cassandria Anger, MD sent at 06/10/2016  9:58 PM EDT ----- Erline Levine pls renew w/3 ref Tramadol, Xanax, and Flexeril.   [facility], [address], [place], [place] [address] [phone].       Pls ref w/11 ref Metformin, Omeprazole (40 mg daily), Atorvastatin (10mg  daily), Bupropion SR (150 mg 2x daily), Citalopram (20 mg daily).    Thx

## 2016-06-12 ENCOUNTER — Other Ambulatory Visit: Payer: Self-pay | Admitting: *Deleted

## 2016-06-12 DIAGNOSIS — D509 Iron deficiency anemia, unspecified: Secondary | ICD-10-CM

## 2016-06-12 MED ORDER — CITALOPRAM HYDROBROMIDE 20 MG PO TABS: 20.0000 mg | ORAL_TABLET | Freq: Every day | ORAL | Status: DC

## 2016-06-12 MED ORDER — BUPROPION HCL ER (SR) 150 MG PO TB12: 150.0000 mg | ORAL_TABLET | Freq: Two times a day (BID) | ORAL | Status: DC

## 2016-06-12 MED ORDER — TRAMADOL HCL 50 MG PO TABS: 50.0000 mg | ORAL_TABLET | Freq: Two times a day (BID) | ORAL | Status: DC | PRN

## 2016-06-12 MED ORDER — ATORVASTATIN CALCIUM 10 MG PO TABS: 10.0000 mg | ORAL_TABLET | Freq: Every day | ORAL | Status: DC

## 2016-06-12 MED ORDER — OMEPRAZOLE 40 MG PO CPDR: 40.0000 mg | DELAYED_RELEASE_CAPSULE | Freq: Every day | ORAL | Status: DC

## 2016-06-12 MED ORDER — CYCLOBENZAPRINE HCL 5 MG PO TABS: ORAL_TABLET | ORAL | Status: DC

## 2016-06-12 MED ORDER — ALPRAZOLAM 0.5 MG PO TABS: 0.5000 mg | ORAL_TABLET | Freq: Two times a day (BID) | ORAL | Status: DC | PRN

## 2016-06-12 NOTE — Telephone Encounter (Signed)
Pt requesting refills on all her maintenance meds. Done. See meds.

## 2016-06-29 ENCOUNTER — Other Ambulatory Visit: Payer: Self-pay | Admitting: Internal Medicine

## 2016-07-01 ENCOUNTER — Other Ambulatory Visit: Payer: Self-pay

## 2016-07-01 ENCOUNTER — Other Ambulatory Visit: Payer: Self-pay | Admitting: *Deleted

## 2016-07-01 MED ORDER — METFORMIN HCL 500 MG PO TABS: 1000.0000 mg | ORAL_TABLET | Freq: Two times a day (BID) | ORAL | Status: DC

## 2016-08-03 ENCOUNTER — Other Ambulatory Visit: Payer: Self-pay | Admitting: Internal Medicine

## 2016-08-04 ENCOUNTER — Other Ambulatory Visit: Payer: Self-pay | Admitting: Internal Medicine

## 2016-08-18 ENCOUNTER — Telehealth: Payer: PRIVATE HEALTH INSURANCE | Admitting: Family

## 2016-08-18 DIAGNOSIS — M545 Low back pain: Secondary | ICD-10-CM

## 2016-08-18 MED ORDER — NAPROXEN 500 MG PO TABS
500.0000 mg | ORAL_TABLET | Freq: Two times a day (BID) | ORAL | 0 refills | Status: DC
Start: 2016-08-18 — End: 2017-03-05

## 2016-08-18 NOTE — Progress Notes (Signed)
We are sorry that you are not feeling well.  Here is how we plan to help!  Based on what you have shared with me it looks like you mostly have acute back pain.  Acute back pain is defined as musculoskeletal pain that can resolve in 1-3 weeks with conservative treatment.  I have prescribed Naprosyn 500 mg twice a day non-steroid anti-inflammatory (NSAID) Some patients experience stomach irritation or in increased heartburn with anti-inflammatory drugs.  Please keep in mind that muscle relaxer's can cause fatigue and should not be taken while at work or driving.  Back pain is very common.  The pain often gets better over time.  The cause of back pain is usually not dangerous.  Most people can learn to manage their back pain on their own.  We can not prescribe narcotic through Evisits. You will need to follow up with your primary care provider. I hope you feel better soon! Home Care  Stay active.  Start with short walks on flat ground if you can.  Try to walk farther each day.  Do not sit, drive or stand in one place for more than 30 minutes.  Do not stay in bed.  Do not avoid exercise or work.  Activity can help your back heal faster.  Be careful when you bend or lift an object.  Bend at your knees, keep the object close to you, and do not twist.  Sleep on a firm mattress.  Lie on your side, and bend your knees.  If you lie on your back, put a pillow under your knees.  Only take medicines as told by your doctor.  Put ice on the injured area.  Put ice in a plastic bag  Place a towel between your skin and the bag  Leave the ice on for 15-20 minutes, 3-4 times a day for the first 2-3 days. 210 After that, you can switch between ice and heat packs.  Ask your doctor about back exercises or massage.  Avoid feeling anxious or stressed.  Find good ways to deal with stress, such as exercise.  Get Help Right Way If:  Your pain does not go away with rest or medicine.  Your pain does not go  away in 1 week.  You have new problems.  You do not feel well.  The pain spreads into your legs.  You cannot control when you poop (bowel movement) or pee (urinate)  You feel sick to your stomach (nauseous) or throw up (vomit)  You have belly (abdominal) pain.  You feel like you may pass out (faint).  If you develop a fever.  Make Sure you:  Understand these instructions.  Will watch your condition  Will get help right away if you are not doing well or get worse.  Your e-visit answers were reviewed by a board certified advanced clinical practitioner to complete your personal care plan.  Depending on the condition, your plan could have included both over the counter or prescription medications.  If there is a problem please reply  once you have received a response from your provider.  Your safety is important to [place].  If you have drug allergies check your prescription carefully.    You can use MyChart to ask questions about today's visit, request a non-urgent call back, or ask for a work or school excuse for 24 hours related to this e-Visit. If it has been greater than 24 hours you will need to follow up with your provider,  or enter a new e-Visit to address those concerns.  You will get an e-mail in the next two days asking about your experience.  I hope that your e-visit has been valuable and will speed your recovery. Thank you for using e-visits.

## 2016-09-05 ENCOUNTER — Other Ambulatory Visit: Payer: Self-pay | Admitting: *Deleted

## 2016-09-05 MED ORDER — METFORMIN HCL 500 MG PO TABS: ORAL_TABLET | ORAL | 5 refills | Status: DC

## 2016-11-06 ENCOUNTER — Ambulatory Visit: Payer: PRIVATE HEALTH INSURANCE | Admitting: Oncology

## 2016-11-06 ENCOUNTER — Other Ambulatory Visit: Payer: PRIVATE HEALTH INSURANCE

## 2017-02-10 ENCOUNTER — Other Ambulatory Visit: Payer: Self-pay

## 2017-02-10 MED ORDER — METFORMIN HCL 500 MG PO TABS: ORAL_TABLET | ORAL | 0 refills | Status: DC

## 2017-02-10 NOTE — Telephone Encounter (Signed)
Metformin sent to pharmacy for 1 month supply. She has an appt schedule for 03/05/17 with Dr. Alain Marion.

## 2017-03-05 ENCOUNTER — Ambulatory Visit (INDEPENDENT_AMBULATORY_CARE_PROVIDER_SITE_OTHER): Payer: Self-pay | Admitting: Internal Medicine

## 2017-03-05 ENCOUNTER — Encounter: Payer: Self-pay | Admitting: Internal Medicine

## 2017-03-05 VITALS — BP 122/60 | HR 106 | Temp 98.6°F | Resp 16 | Ht 66.25 in | Wt 226.8 lb

## 2017-03-05 DIAGNOSIS — E1165 Type 2 diabetes mellitus with hyperglycemia: Secondary | ICD-10-CM

## 2017-03-05 DIAGNOSIS — Z0000 Encounter for general adult medical examination without abnormal findings: Secondary | ICD-10-CM

## 2017-03-05 DIAGNOSIS — R000 Tachycardia, unspecified: Secondary | ICD-10-CM

## 2017-03-05 DIAGNOSIS — D508 Other iron deficiency anemias: Secondary | ICD-10-CM

## 2017-03-05 DIAGNOSIS — F411 Generalized anxiety disorder: Secondary | ICD-10-CM

## 2017-03-05 DIAGNOSIS — I5020 Unspecified systolic (congestive) heart failure: Secondary | ICD-10-CM

## 2017-03-05 DIAGNOSIS — F17200 Nicotine dependence, unspecified, uncomplicated: Secondary | ICD-10-CM

## 2017-03-05 MED ORDER — EMPAGLIFLOZIN 25 MG PO TABS: 25.0000 mg | ORAL_TABLET | Freq: Every day | ORAL | 11 refills | Status: DC

## 2017-03-05 MED ORDER — TRAMADOL HCL 50 MG PO TABS: 50.0000 mg | ORAL_TABLET | Freq: Two times a day (BID) | ORAL | 3 refills | Status: DC | PRN

## 2017-03-05 MED ORDER — DULAGLUTIDE 1.5 MG/0.5ML ~~LOC~~ SOAJ: SUBCUTANEOUS | 3 refills | Status: DC

## 2017-03-05 MED ORDER — BUPROPION HCL ER (SR) 150 MG PO TB12: 150.0000 mg | ORAL_TABLET | Freq: Two times a day (BID) | ORAL | 11 refills | Status: DC

## 2017-03-05 MED ORDER — METFORMIN HCL 500 MG PO TABS: ORAL_TABLET | ORAL | 11 refills | Status: DC

## 2017-03-05 MED ORDER — ATORVASTATIN CALCIUM 10 MG PO TABS: 10.0000 mg | ORAL_TABLET | Freq: Every day | ORAL | 11 refills | Status: DC

## 2017-03-05 MED ORDER — ALPRAZOLAM 0.5 MG PO TABS: 0.5000 mg | ORAL_TABLET | Freq: Two times a day (BID) | ORAL | 3 refills | Status: DC | PRN

## 2017-03-05 MED ORDER — CITALOPRAM HYDROBROMIDE 20 MG PO TABS: 20.0000 mg | ORAL_TABLET | Freq: Every day | ORAL | 11 refills | Status: DC

## 2017-03-05 NOTE — Assessment & Plan Note (Signed)
Clinically doing well Labs

## 2017-03-05 NOTE — Assessment & Plan Note (Signed)
Labs

## 2017-03-05 NOTE — Assessment & Plan Note (Addendum)
Discussed health maintenance now. No insurance - the pt is planning to get one

## 2017-03-05 NOTE — Assessment & Plan Note (Signed)
Rare smoking

## 2017-03-05 NOTE — Assessment & Plan Note (Addendum)
not taking Trulicity, Jardiance due to cost  Consider Prandin Labs

## 2017-03-05 NOTE — Assessment & Plan Note (Signed)
Xanax prn  Potential benefits of a long term benzodiazepines  use as well as potential risks  and complications were explained to the patient and were aknowledged. 

## 2017-03-05 NOTE — Progress Notes (Signed)
Pre-visit discussion using our clinic review tool. No additional management support is needed unless otherwise documented below in the visit note.  

## 2017-03-05 NOTE — Progress Notes (Signed)
Subjective:  Patient ID: [name] [name], female    DOB: [birth year]  Age: 51 y.o. MRN: [id number]  CC: Annual Exam (DM type 2, anxiety)   HPI [name] [name] presents for a well exam F/u DM - not taking Trulicity, Jardiance due to cost, HAs, anxiety  Outpatient Medications Prior to Visit  Medication Sig Dispense Refill  . ALPRAZolam (XANAX) 0.5 MG tablet Take 1 tablet (0.5 mg total) by mouth 2 (two) times daily as needed. 60 tablet 3  . atorvastatin (LIPITOR) 10 MG tablet Take 1 tablet (10 mg total) by mouth daily. 90 tablet 3  . buPROPion (WELLBUTRIN SR) 150 MG 12 hr tablet Take 1 tablet (150 mg total) by mouth 2 (two) times daily. 180 tablet 3  . Cholecalciferol (EQL VITAMIN D3) 1000 UNITS tablet Take 1,000 Units by mouth daily. Reported on 05/01/2016    . citalopram (CELEXA) 20 MG tablet Take 1 tablet (20 mg total) by mouth daily. 90 tablet 3  . cyclobenzaprine (FLEXERIL) 5 MG tablet 1 tablet at bedtime 30 tablet 3  . metFORMIN (GLUCOPHAGE) 500 MG tablet TAKE 2 TABLETS BY MOUTH TWICE DAILY 60 tablet 0  . omeprazole (PRILOSEC) 40 MG capsule Take 1 capsule (40 mg total) by mouth daily. 90 capsule 3  . traMADol (ULTRAM) 50 MG tablet Take 1-2 tablets (50-100 mg total) by mouth 2 (two) times daily as needed. 60 tablet 3  . buPROPion (WELLBUTRIN XL) 150 MG 24 hr tablet Take 1 tablet (150 mg total) by mouth daily. 90 tablet 3  . doxycycline (VIBRA-TABS) 100 MG tablet Take 1 tablet (100 mg total) by mouth 2 (two) times daily. 20 tablet 2  . empagliflozin (JARDIANCE) 25 MG TABS tablet Take 25 mg by mouth daily. 30 tablet 11  . naproxen (NAPROSYN) 500 MG tablet Take 1 tablet (500 mg total) by mouth 2 (two) times daily with a meal. 30 tablet 0  . triamcinolone ointment (KENALOG) 0.5 % Apply 1 application topically 2 (two) times daily. 30 g 3  . TRULICITY 1.5 XB/2.8UX SOPN USE ONE(1) PEN WEEKLY AS DIRECTED 2 mL 3   No facility-administered medications prior to visit.     ROS Review of  Systems  Constitutional: Positive for unexpected weight change. Negative for activity change, appetite change, chills and fatigue.  HENT: Negative for congestion, mouth sores and sinus pressure.   Eyes: Negative for visual disturbance.  Respiratory: Negative for cough and chest tightness.   Gastrointestinal: Negative for abdominal pain and nausea.  Genitourinary: Negative for difficulty urinating, frequency and vaginal pain.  Musculoskeletal: Negative for back pain and gait problem.  Skin: Negative for pallor and rash.  Neurological: Negative for dizziness, tremors, weakness, numbness and headaches.  Psychiatric/Behavioral: Negative for confusion, sleep disturbance and suicidal ideas.    Objective:  BP 122/60   Pulse (!) 106   Temp 98.6 F (37 C) (Oral)   Resp 16   Ht 5' 6.25" (1.683 m)   Wt 226 lb 12 oz (102.9 kg)   LMP 03/02/2017   SpO2 98%   BMI 36.32 kg/m   BP Readings from Last 3 Encounters:  03/05/17 122/60  05/01/16 (!) 143/68  03/20/16 140/74    Wt Readings from Last 3 Encounters:  03/05/17 226 lb 12 oz (102.9 kg)  05/01/16 243 lb 14.4 oz (110.6 kg)  03/20/16 237 lb (107.5 kg)    Physical Exam  Constitutional: She appears well-developed. No distress.  HENT:  Head: Normocephalic.  Right Ear: External ear normal.  Left Ear: External ear normal.  Nose: Nose normal.  Mouth/Throat: Oropharynx is clear and moist.  Eyes: Conjunctivae are normal. Pupils are equal, round, and reactive to light. Right eye exhibits no discharge. Left eye exhibits no discharge.  Neck: Normal range of motion. Neck supple. No JVD present. No tracheal deviation present. No thyromegaly present.  Cardiovascular: Normal rate, regular rhythm and normal heart sounds.   Pulmonary/Chest: No stridor. No respiratory distress. She has no wheezes.  Abdominal: Soft. Bowel sounds are normal. She exhibits no distension and no mass. There is no tenderness. There is no rebound and no guarding.    Musculoskeletal: She exhibits no edema or tenderness.  Lymphadenopathy:    She has no cervical adenopathy.  Neurological: She displays normal reflexes. No cranial nerve deficit. She exhibits normal muscle tone. Coordination normal.  Skin: No rash noted. No erythema.  Psychiatric: She has a normal mood and affect. Her behavior is normal. Judgment and thought content normal.  Obese  Lab Results  Component Value Date   WBC 7.6 05/01/2016   HGB 13.7 05/01/2016   HCT 42.6 05/01/2016   PLT 178 05/01/2016   GLUCOSE 158 (H) 03/20/2016   CHOL 169 09/29/2014   TRIG (H) 09/29/2014    485.0 Triglyceride is over 400; calculations on Lipids are invalid.   HDL 28.00 (L) 09/29/2014   LDLDIRECT 72.3 09/29/2014   ALT 21 12/06/2015   AST 17 12/06/2015   NA 137 03/20/2016   K 4.3 03/20/2016   CL 103 03/20/2016   CREATININE 0.59 03/20/2016   BUN 10 03/20/2016   CO2 28 03/20/2016   TSH 1.81 02/28/2013   HGBA1C 6.8 (H) 03/20/2016    Mm Digital Screening  Result Date: 04/06/2012 DG SCREEN MAMMOGRAM BILATERAL Bilateral CC and MLO view(s) were taken. DIGITAL SCREENING MAMMOGRAM WITH CAD: The breast tissue is almost entirely fatty.  No masses or malignant type calcifications are identified. Images were processed with CAD. IMPRESSION: No specific mammographic evidence of malignancy.  Next screening mammogram is recommended in one year. A result letter of this screening mammogram will be mailed directly to the patient. ASSESSMENT: Negative - BI-RADS 1 Screening mammogram in 1 year. ,   Assessment & Plan:   There are no diagnoses linked to this encounter. I have discontinued Ms. Favero's empagliflozin, triamcinolone ointment, doxycycline, TRULICITY, and naproxen. I am also having her maintain her Cholecalciferol, cyclobenzaprine, ALPRAZolam, traMADol, atorvastatin, citalopram, buPROPion, omeprazole, metFORMIN, and aspirin.  Meds ordered this encounter  Medications  . aspirin 81 MG chewable tablet     Sig: Chew 81 mg by mouth daily.     Follow-up: No Follow-up on file.  [name], MD

## 2017-03-26 ENCOUNTER — Other Ambulatory Visit (INDEPENDENT_AMBULATORY_CARE_PROVIDER_SITE_OTHER): Payer: Self-pay

## 2017-03-26 DIAGNOSIS — I5020 Unspecified systolic (congestive) heart failure: Secondary | ICD-10-CM

## 2017-03-26 DIAGNOSIS — E1165 Type 2 diabetes mellitus with hyperglycemia: Secondary | ICD-10-CM

## 2017-03-26 LAB — HEPATIC FUNCTION PANEL
ALBUMIN: 4 g/dL (ref 3.5–5.2)
ALT: 39 U/L — ABNORMAL HIGH (ref 0–35)
AST: 29 U/L (ref 0–37)
Alkaline Phosphatase: 82 U/L (ref 39–117)
Bilirubin, Direct: 0.1 mg/dL (ref 0.0–0.3)
TOTAL PROTEIN: 7 g/dL (ref 6.0–8.3)
Total Bilirubin: 0.9 mg/dL (ref 0.2–1.2)

## 2017-03-26 LAB — CBC WITH DIFFERENTIAL/PLATELET
BASOS PCT: 0.9 % (ref 0.0–3.0)
Basophils Absolute: 0.1 10*3/uL (ref 0.0–0.1)
EOS PCT: 3.3 % (ref 0.0–5.0)
Eosinophils Absolute: 0.3 10*3/uL (ref 0.0–0.7)
HCT: 38.7 % (ref 36.0–46.0)
Hemoglobin: 13.2 g/dL (ref 12.0–15.0)
LYMPHS ABS: 2 10*3/uL (ref 0.7–4.0)
Lymphocytes Relative: 22.7 % (ref 12.0–46.0)
MCHC: 34.1 g/dL (ref 30.0–36.0)
MCV: 82.5 fl (ref 78.0–100.0)
MONOS PCT: 3.9 % (ref 3.0–12.0)
Monocytes Absolute: 0.3 10*3/uL (ref 0.1–1.0)
NEUTROS ABS: 6 10*3/uL (ref 1.4–7.7)
NEUTROS PCT: 69.2 % (ref 43.0–77.0)
PLATELETS: 201 10*3/uL (ref 150.0–400.0)
RBC: 4.7 Mil/uL (ref 3.87–5.11)
RDW: 14.1 % (ref 11.5–15.5)
WBC: 8.7 10*3/uL (ref 4.0–10.5)

## 2017-03-26 LAB — BASIC METABOLIC PANEL
BUN: 9 mg/dL (ref 6–23)
CHLORIDE: 100 meq/L (ref 96–112)
CO2: 25 mEq/L (ref 19–32)
CREATININE: 0.57 mg/dL (ref 0.40–1.20)
Calcium: 9 mg/dL (ref 8.4–10.5)
GFR: 120.19 mL/min (ref >60.00)
Glucose, Bld: 282 mg/dL — ABNORMAL HIGH (ref 70–99)
Potassium: 4 mEq/L (ref 3.5–5.1)
Sodium: 132 mEq/L — ABNORMAL LOW (ref 135–145)

## 2017-03-26 LAB — LIPID PANEL
CHOLESTEROL: 197 mg/dL (ref 0–200)
HDL: 30.4 mg/dL — ABNORMAL LOW (ref >39.00)
Total CHOL/HDL Ratio: 6

## 2017-03-26 LAB — HEMOGLOBIN A1C: Hgb A1c MFr Bld: 9.7 % — ABNORMAL HIGH (ref 4.6–6.5)

## 2017-03-26 LAB — LDL CHOLESTEROL, DIRECT: Direct LDL: 57 mg/dL

## 2017-03-26 LAB — TSH: TSH: 2.35 u[IU]/mL (ref 0.35–4.50)

## 2017-03-30 ENCOUNTER — Encounter: Payer: Self-pay | Admitting: Internal Medicine

## 2017-03-30 ENCOUNTER — Ambulatory Visit (INDEPENDENT_AMBULATORY_CARE_PROVIDER_SITE_OTHER): Payer: Self-pay | Admitting: *Deleted

## 2017-03-30 DIAGNOSIS — Z23 Encounter for immunization: Secondary | ICD-10-CM

## 2017-03-30 MED ORDER — FENOFIBRATE 145 MG PO TABS: 145.0000 mg | ORAL_TABLET | Freq: Every day | ORAL | 11 refills | Status: DC

## 2017-03-31 ENCOUNTER — Encounter: Payer: Self-pay | Admitting: Internal Medicine

## 2017-04-01 ENCOUNTER — Other Ambulatory Visit: Payer: Self-pay | Admitting: Internal Medicine

## 2017-04-01 LAB — TB SKIN TEST
Induration: 0 mm
TB Skin Test: NEGATIVE

## 2017-04-01 MED ORDER — REPAGLINIDE 2 MG PO TABS: 1.0000 mg | ORAL_TABLET | Freq: Three times a day (TID) | ORAL | 11 refills | Status: DC

## 2017-05-17 ENCOUNTER — Encounter: Payer: Self-pay | Admitting: Internal Medicine

## 2017-05-19 ENCOUNTER — Telehealth: Payer: Self-pay

## 2017-05-19 DIAGNOSIS — D508 Other iron deficiency anemias: Secondary | ICD-10-CM

## 2017-05-19 MED ORDER — FENOFIBRATE 145 MG PO TABS: 145.0000 mg | ORAL_TABLET | Freq: Every day | ORAL | 1 refills | Status: DC

## 2017-05-19 MED ORDER — METFORMIN HCL 500 MG PO TABS: ORAL_TABLET | ORAL | 1 refills | Status: DC

## 2017-05-19 MED ORDER — CHOLECALCIFEROL 25 MCG (1000 UT) PO TABS: 1000.0000 [IU] | ORAL_TABLET | Freq: Every day | ORAL | 1 refills | Status: AC

## 2017-05-19 MED ORDER — BUPROPION HCL ER (SR) 150 MG PO TB12: 150.0000 mg | ORAL_TABLET | Freq: Two times a day (BID) | ORAL | 1 refills | Status: DC

## 2017-05-19 MED ORDER — ASPIRIN 81 MG PO CHEW
81.0000 mg | CHEWABLE_TABLET | Freq: Every day | ORAL | 1 refills | Status: DC
Start: 2017-05-19 — End: 2024-10-30

## 2017-05-19 MED ORDER — CITALOPRAM HYDROBROMIDE 20 MG PO TABS: 20.0000 mg | ORAL_TABLET | Freq: Every day | ORAL | 1 refills | Status: DC

## 2017-05-19 MED ORDER — REPAGLINIDE 2 MG PO TABS: 1.0000 mg | ORAL_TABLET | Freq: Three times a day (TID) | ORAL | 1 refills | Status: DC

## 2017-05-19 MED ORDER — ATORVASTATIN CALCIUM 10 MG PO TABS: 10.0000 mg | ORAL_TABLET | Freq: Every day | ORAL | 1 refills | Status: DC

## 2017-05-19 MED ORDER — OMEPRAZOLE 40 MG PO CPDR
40.0000 mg | DELAYED_RELEASE_CAPSULE | Freq: Every day | ORAL | 1 refills | Status: DC
Start: 2017-05-19 — End: 2017-05-20

## 2017-05-19 NOTE — Telephone Encounter (Signed)
[facility] sent rf rq for medications.   erx sent as requested.

## 2017-05-20 ENCOUNTER — Other Ambulatory Visit: Payer: Self-pay | Admitting: Internal Medicine

## 2017-05-20 DIAGNOSIS — D508 Other iron deficiency anemias: Secondary | ICD-10-CM

## 2017-05-20 MED ORDER — OMEPRAZOLE 40 MG PO CPDR: 40.0000 mg | DELAYED_RELEASE_CAPSULE | Freq: Every day | ORAL | 1 refills | Status: DC

## 2017-05-20 MED ORDER — REPAGLINIDE 2 MG PO TABS: 1.0000 mg | ORAL_TABLET | Freq: Three times a day (TID) | ORAL | 1 refills | Status: DC

## 2017-05-20 MED ORDER — EMPAGLIFLOZIN 25 MG PO TABS: 25.0000 mg | ORAL_TABLET | Freq: Every day | ORAL | 11 refills | Status: DC

## 2017-05-20 MED ORDER — LORCASERIN HCL 10 MG PO TABS: 1.0000 | ORAL_TABLET | Freq: Two times a day (BID) | ORAL | 0 refills | Status: DC

## 2017-05-20 MED ORDER — CITALOPRAM HYDROBROMIDE 20 MG PO TABS: 20.0000 mg | ORAL_TABLET | Freq: Every day | ORAL | 1 refills | Status: DC

## 2017-05-20 MED ORDER — DULAGLUTIDE 1.5 MG/0.5ML ~~LOC~~ SOAJ: SUBCUTANEOUS | 3 refills | Status: DC

## 2017-05-20 MED ORDER — BUPROPION HCL ER (SR) 150 MG PO TB12: 150.0000 mg | ORAL_TABLET | Freq: Two times a day (BID) | ORAL | 1 refills | Status: DC

## 2017-05-20 MED ORDER — FENOFIBRATE 145 MG PO TABS: 145.0000 mg | ORAL_TABLET | Freq: Every day | ORAL | 1 refills | Status: DC

## 2017-05-20 MED ORDER — ATORVASTATIN CALCIUM 10 MG PO TABS: 10.0000 mg | ORAL_TABLET | Freq: Every day | ORAL | 1 refills | Status: DC

## 2017-05-20 MED ORDER — METFORMIN HCL 500 MG PO TABS: ORAL_TABLET | ORAL | 1 refills | Status: DC

## 2017-07-01 ENCOUNTER — Other Ambulatory Visit: Payer: Self-pay

## 2017-07-01 MED ORDER — OMEPRAZOLE 40 MG PO CPDR: 40.0000 mg | DELAYED_RELEASE_CAPSULE | Freq: Every day | ORAL | 1 refills | Status: DC

## 2017-07-07 ENCOUNTER — Other Ambulatory Visit (INDEPENDENT_AMBULATORY_CARE_PROVIDER_SITE_OTHER): Payer: BC Managed Care – PPO

## 2017-07-07 ENCOUNTER — Ambulatory Visit (INDEPENDENT_AMBULATORY_CARE_PROVIDER_SITE_OTHER): Payer: BC Managed Care – PPO | Admitting: Internal Medicine

## 2017-07-07 ENCOUNTER — Encounter: Payer: Self-pay | Admitting: Internal Medicine

## 2017-07-07 DIAGNOSIS — E669 Obesity, unspecified: Secondary | ICD-10-CM | POA: Diagnosis not present

## 2017-07-07 DIAGNOSIS — R5383 Other fatigue: Secondary | ICD-10-CM

## 2017-07-07 DIAGNOSIS — I509 Heart failure, unspecified: Secondary | ICD-10-CM | POA: Diagnosis not present

## 2017-07-07 DIAGNOSIS — E1165 Type 2 diabetes mellitus with hyperglycemia: Secondary | ICD-10-CM

## 2017-07-07 DIAGNOSIS — F411 Generalized anxiety disorder: Secondary | ICD-10-CM | POA: Diagnosis not present

## 2017-07-07 LAB — BASIC METABOLIC PANEL
BUN: 10 mg/dL (ref 6–23)
CO2: 26 meq/L (ref 19–32)
CREATININE: 0.62 mg/dL (ref 0.40–1.20)
Calcium: 9.4 mg/dL (ref 8.4–10.5)
Chloride: 100 mEq/L (ref 96–112)
GFR: 108.94 mL/min (ref >60.00)
Glucose, Bld: 291 mg/dL — ABNORMAL HIGH (ref 70–99)
Potassium: 4.2 mEq/L (ref 3.5–5.1)
Sodium: 134 mEq/L — ABNORMAL LOW (ref 135–145)

## 2017-07-07 LAB — CBC WITH DIFFERENTIAL/PLATELET
BASOS ABS: 0.1 10*3/uL (ref 0.0–0.1)
Basophils Relative: 1.3 % (ref 0.0–3.0)
EOS PCT: 2.1 % (ref 0.0–5.0)
Eosinophils Absolute: 0.2 10*3/uL (ref 0.0–0.7)
HCT: 38.4 % (ref 36.0–46.0)
HEMOGLOBIN: 12.7 g/dL (ref 12.0–15.0)
LYMPHS ABS: 1.6 10*3/uL (ref 0.7–4.0)
LYMPHS PCT: 22 % (ref 12.0–46.0)
MCHC: 33.1 g/dL (ref 30.0–36.0)
MCV: 82.6 fl (ref 78.0–100.0)
MONOS PCT: 3.7 % (ref 3.0–12.0)
Monocytes Absolute: 0.3 10*3/uL (ref 0.1–1.0)
NEUTROS PCT: 70.9 % (ref 43.0–77.0)
Neutro Abs: 5.3 10*3/uL (ref 1.4–7.7)
Platelets: 211 10*3/uL (ref 150.0–400.0)
RBC: 4.64 Mil/uL (ref 3.87–5.11)
RDW: 14.9 % (ref 11.5–15.5)
WBC: 7.4 10*3/uL (ref 4.0–10.5)

## 2017-07-07 LAB — HEPATIC FUNCTION PANEL
ALBUMIN: 4.1 g/dL (ref 3.5–5.2)
ALK PHOS: 74 U/L (ref 39–117)
ALT: 46 U/L — ABNORMAL HIGH (ref 0–35)
AST: 41 U/L — ABNORMAL HIGH (ref 0–37)
Bilirubin, Direct: 0.2 mg/dL (ref 0.0–0.3)
TOTAL PROTEIN: 7 g/dL (ref 6.0–8.3)
Total Bilirubin: 0.7 mg/dL (ref 0.2–1.2)

## 2017-07-07 LAB — TSH: TSH: 1.94 u[IU]/mL (ref 0.35–4.50)

## 2017-07-07 LAB — VITAMIN B12: VITAMIN B 12: 627 pg/mL (ref 211–911)

## 2017-07-07 LAB — HEMOGLOBIN A1C: HEMOGLOBIN A1C: 10.4 % — AB (ref 4.6–6.5)

## 2017-07-07 MED ORDER — LORCASERIN HCL 10 MG PO TABS: 1.0000 | ORAL_TABLET | Freq: Two times a day (BID) | ORAL | 0 refills | Status: DC

## 2017-07-07 NOTE — Progress Notes (Signed)
Subjective:  Patient ID: [name] [name], female    DOB: 07/25/1966  Age: 52 y.o. MRN: [id number]  CC: No chief complaint on file.   HPI [name] [name] presents for DM, CHF, HTN f/u C/o fatigue x 1 mo  Outpatient Medications Prior to Visit  Medication Sig Dispense Refill  . ALPRAZolam (XANAX) 0.5 MG tablet Take 1 tablet (0.5 mg total) by mouth 2 (two) times daily as needed. 60 tablet 3  . aspirin 81 MG chewable tablet Chew 1 tablet (81 mg total) by mouth daily. 90 tablet 1  . atorvastatin (LIPITOR) 10 MG tablet Take 1 tablet (10 mg total) by mouth daily. 90 tablet 1  . buPROPion (WELLBUTRIN SR) 150 MG 12 hr tablet Take 1 tablet (150 mg total) by mouth 2 (two) times daily. 180 tablet 1  . Cholecalciferol (EQL VITAMIN D3) 1000 units tablet Take 1 tablet (1,000 Units total) by mouth daily. 90 tablet 1  . citalopram (CELEXA) 20 MG tablet Take 1 tablet (20 mg total) by mouth daily. 90 tablet 1  . cyclobenzaprine (FLEXERIL) 5 MG tablet 1 tablet at bedtime 30 tablet 3  . Dulaglutide (TRULICITY) 1.5 WU/9.8JX SOPN USE ONE(1) PEN WEEKLY AS DIRECTED 2 mL 3  . empagliflozin (JARDIANCE) 25 MG TABS tablet Take 25 mg by mouth daily. 30 tablet 11  . fenofibrate (TRICOR) 145 MG tablet Take 1 tablet (145 mg total) by mouth daily. 90 tablet 1  . Lorcaserin HCl (BELVIQ) 10 MG TABS Take 1 tablet by mouth 2 (two) times daily. 180 tablet 0  . metFORMIN (GLUCOPHAGE) 500 MG tablet TAKE 2 TABLETS BY MOUTH TWICE DAILY 180 tablet 1  . omeprazole (PRILOSEC) 40 MG capsule Take 1 capsule (40 mg total) by mouth daily. 90 capsule 1  . repaglinide (PRANDIN) 2 MG tablet Take 0.5-1 tablets (1-2 mg total) by mouth 3 (three) times daily before meals. 270 tablet 1  . traMADol (ULTRAM) 50 MG tablet Take 1-2 tablets (50-100 mg total) by mouth 2 (two) times daily as needed. 60 tablet 3   No facility-administered medications prior to visit.     ROS Review of Systems  Constitutional: Positive for fatigue. Negative for  activity change, appetite change, chills and unexpected weight change.  HENT: Negative for congestion, mouth sores and sinus pressure.   Eyes: Negative for visual disturbance.  Respiratory: Negative for cough and chest tightness.   Gastrointestinal: Negative for abdominal pain and nausea.  Genitourinary: Negative for difficulty urinating, frequency and vaginal pain.  Musculoskeletal: Negative for back pain and gait problem.  Skin: Negative for pallor and rash.  Neurological: Negative for dizziness, tremors, weakness, numbness and headaches.  Psychiatric/Behavioral: Negative for confusion and sleep disturbance.    Objective:  BP 140/80 (BP Location: Left Arm, Patient Position: Sitting, Cuff Size: Large)   Pulse 98   Temp 98.5 F (36.9 C) (Oral)   Ht 5' 6.25" (1.683 m)   Wt 229 lb (103.9 kg)   SpO2 98%   BMI 36.68 kg/m   BP Readings from Last 3 Encounters:  07/07/17 140/80  03/05/17 122/60  05/01/16 (!) 143/68    Wt Readings from Last 3 Encounters:  07/07/17 229 lb (103.9 kg)  03/05/17 226 lb 12 oz (102.9 kg)  05/01/16 243 lb 14.4 oz (110.6 kg)    Physical Exam  Constitutional: She appears well-developed. No distress.  HENT:  Head: Normocephalic.  Right Ear: External ear normal.  Left Ear: External ear normal.  Nose: Nose normal.  Mouth/Throat: Oropharynx  is clear and moist.  Eyes: Pupils are equal, round, and reactive to light. Conjunctivae are normal. Right eye exhibits no discharge. Left eye exhibits no discharge.  Neck: Normal range of motion. Neck supple. No JVD present. No tracheal deviation present. No thyromegaly present.  Cardiovascular: Normal rate, regular rhythm and normal heart sounds.   Pulmonary/Chest: No stridor. No respiratory distress. She has no wheezes.  Abdominal: Soft. Bowel sounds are normal. She exhibits no distension and no mass. There is no tenderness. There is no rebound and no guarding.  Musculoskeletal: She exhibits tenderness. She exhibits  no edema.  Lymphadenopathy:    She has no cervical adenopathy.  Neurological: She displays normal reflexes. No cranial nerve deficit. She exhibits normal muscle tone. Coordination normal.  Skin: No rash noted. No erythema.  Psychiatric: She has a normal mood and affect. Her behavior is normal. Judgment and thought content normal.  Obese  Lab Results  Component Value Date   WBC 8.7 03/26/2017   HGB 13.2 03/26/2017   HCT 38.7 03/26/2017   PLT 201.0 03/26/2017   GLUCOSE 282 (H) 03/26/2017   CHOL 197 03/26/2017   TRIG (H) 03/26/2017    820.0 Triglyceride is over 400; calculations on Lipids are invalid.   HDL 30.40 (L) 03/26/2017   LDLDIRECT 57.0 03/26/2017   ALT 39 (H) 03/26/2017   AST 29 03/26/2017   NA 132 (L) 03/26/2017   K 4.0 03/26/2017   CL 100 03/26/2017   CREATININE 0.57 03/26/2017   BUN 9 03/26/2017   CO2 25 03/26/2017   TSH 2.35 03/26/2017   HGBA1C 9.7 (H) 03/26/2017    Mm Digital Screening  Result Date: 04/06/2012 DG SCREEN MAMMOGRAM BILATERAL Bilateral CC and MLO view(s) were taken. DIGITAL SCREENING MAMMOGRAM WITH CAD: The breast tissue is almost entirely fatty.  No masses or malignant type calcifications are identified. Images were processed with CAD. IMPRESSION: No specific mammographic evidence of malignancy.  Next screening mammogram is recommended in one year. A result letter of this screening mammogram will be mailed directly to the patient. ASSESSMENT: Negative - BI-RADS 1 Screening mammogram in 1 year. ,   Assessment & Plan:   There are no diagnoses linked to this encounter. I am having Ms. Cohill maintain her cyclobenzaprine, ALPRAZolam, traMADol, aspirin, Cholecalciferol, Lorcaserin HCl, atorvastatin, buPROPion, citalopram, Dulaglutide, empagliflozin, fenofibrate, metFORMIN, repaglinide, and omeprazole.  No orders of the defined types were placed in this encounter.    Follow-up: No Follow-up on file.  [name], MD

## 2017-07-07 NOTE — Assessment & Plan Note (Signed)
BMET 

## 2017-07-07 NOTE — Assessment & Plan Note (Signed)
Labs

## 2017-07-07 NOTE — Assessment & Plan Note (Signed)
Xanax prn 

## 2017-07-07 NOTE — Assessment & Plan Note (Signed)
Ref to Dr Tawni Levy Rx

## 2017-07-10 MED ORDER — EMPAGLIFLOZIN 25 MG PO TABS: ORAL_TABLET | ORAL | 3 refills | Status: DC

## 2017-07-14 ENCOUNTER — Ambulatory Visit: Payer: Self-pay | Admitting: Internal Medicine

## 2017-09-29 ENCOUNTER — Ambulatory Visit: Payer: Self-pay | Admitting: Internal Medicine

## 2017-10-05 ENCOUNTER — Telehealth: Payer: Self-pay

## 2017-10-05 NOTE — Telephone Encounter (Signed)
[name] [name] (Key: [id number])  Submitted but received a notice that the pt Plan does not support Electronic PA>

## 2017-10-21 ENCOUNTER — Ambulatory Visit (INDEPENDENT_AMBULATORY_CARE_PROVIDER_SITE_OTHER): Payer: BC Managed Care – PPO | Admitting: Internal Medicine

## 2017-10-21 ENCOUNTER — Encounter: Payer: Self-pay | Admitting: Internal Medicine

## 2017-10-21 ENCOUNTER — Other Ambulatory Visit: Payer: BC Managed Care – PPO

## 2017-10-21 VITALS — BP 132/78 | HR 81 | Temp 98.5°F | Ht 66.25 in | Wt 227.0 lb

## 2017-10-21 DIAGNOSIS — Z23 Encounter for immunization: Secondary | ICD-10-CM

## 2017-10-21 DIAGNOSIS — E1165 Type 2 diabetes mellitus with hyperglycemia: Secondary | ICD-10-CM

## 2017-10-21 DIAGNOSIS — M25512 Pain in left shoulder: Secondary | ICD-10-CM | POA: Diagnosis not present

## 2017-10-21 MED ORDER — OXYCODONE-ACETAMINOPHEN 5-325 MG PO TABS: 1.0000 | ORAL_TABLET | Freq: Two times a day (BID) | ORAL | 0 refills | Status: DC | PRN

## 2017-10-21 MED ORDER — METHYLPREDNISOLONE ACETATE 40 MG/ML IJ SUSP
40.0000 mg | Freq: Once | INTRAMUSCULAR | Status: AC
Start: 2017-10-21 — End: 2017-10-21
  Administered 2017-10-21: 40 mg via INTRA_ARTICULAR

## 2017-10-21 MED ORDER — METHYLPREDNISOLONE ACETATE 40 MG/ML IJ SUSP: 40.0000 mg | Freq: Once | INTRAMUSCULAR | Status: DC

## 2017-10-21 NOTE — Progress Notes (Signed)
Subjective:  Patient ID: [name] [name], female    DOB: August 22, 1966  Age: 51 y.o. MRN: [id number]  CC: No chief complaint on file.   HPI [name] [name] presents for DM, dyslipidemia, depression C/o L shoulder pain x 1 mo  Outpatient Medications Prior to Visit  Medication Sig Dispense Refill  . ALPRAZolam (XANAX) 0.5 MG tablet Take 1 tablet (0.5 mg total) by mouth 2 (two) times daily as needed. 60 tablet 3  . aspirin 81 MG chewable tablet Chew 1 tablet (81 mg total) by mouth daily. 90 tablet 1  . atorvastatin (LIPITOR) 10 MG tablet Take 1 tablet (10 mg total) by mouth daily. 90 tablet 1  . buPROPion (WELLBUTRIN SR) 150 MG 12 hr tablet Take 1 tablet (150 mg total) by mouth 2 (two) times daily. 180 tablet 1  . Cholecalciferol (EQL VITAMIN D3) 1000 units tablet Take 1 tablet (1,000 Units total) by mouth daily. 90 tablet 1  . citalopram (CELEXA) 20 MG tablet Take 1 tablet (20 mg total) by mouth daily. 90 tablet 1  . cyclobenzaprine (FLEXERIL) 5 MG tablet 1 tablet at bedtime 30 tablet 3  . empagliflozin (JARDIANCE) 25 MG TABS tablet Take 1 tablet by mouth once a day 30 tablet 3  . fenofibrate (TRICOR) 145 MG tablet Take 1 tablet (145 mg total) by mouth daily. 90 tablet 1  . metFORMIN (GLUCOPHAGE) 500 MG tablet TAKE 2 TABLETS BY MOUTH TWICE DAILY 180 tablet 1  . omeprazole (PRILOSEC) 40 MG capsule Take 1 capsule (40 mg total) by mouth daily. 90 capsule 1  . repaglinide (PRANDIN) 2 MG tablet Take 0.5-1 tablets (1-2 mg total) by mouth 3 (three) times daily before meals. 270 tablet 1  . traMADol (ULTRAM) 50 MG tablet Take 1-2 tablets (50-100 mg total) by mouth 2 (two) times daily as needed. 60 tablet 3  . Dulaglutide (TRULICITY) 1.5 PT/4.6FK SOPN USE ONE(1) PEN WEEKLY AS DIRECTED (Patient not taking: Reported on 10/21/2017) 2 mL 3  . empagliflozin (JARDIANCE) 25 MG TABS tablet Take 25 mg by mouth daily. (Patient not taking: Reported on 10/21/2017) 30 tablet 11  . Lorcaserin HCl (BELVIQ) 10 MG  TABS Take 1 tablet by mouth 2 (two) times daily. (Patient not taking: Reported on 10/21/2017) 180 tablet 0   No facility-administered medications prior to visit.     ROS Review of Systems  Objective:  BP 132/78 (BP Location: Left Arm, Patient Position: Sitting, Cuff Size: Large)   Pulse 81   Temp 98.5 F (36.9 C) (Oral)   Ht 5' 6.25" (1.683 m)   Wt 227 lb (103 kg)   SpO2 99%   BMI 36.36 kg/m   BP Readings from Last 3 Encounters:  10/21/17 132/78  07/07/17 140/80  03/05/17 122/60    Wt Readings from Last 3 Encounters:  10/21/17 227 lb (103 kg)  07/07/17 229 lb (103.9 kg)  03/05/17 226 lb 12 oz (102.9 kg)    Physical Exam  Lab Results  Component Value Date   WBC 7.4 07/07/2017   HGB 12.7 07/07/2017   HCT 38.4 07/07/2017   PLT 211.0 07/07/2017   GLUCOSE 291 (H) 07/07/2017   CHOL 197 03/26/2017   TRIG (H) 03/26/2017    820.0 Triglyceride is over 400; calculations on Lipids are invalid.   HDL 30.40 (L) 03/26/2017   LDLDIRECT 57.0 03/26/2017   ALT 46 (H) 07/07/2017   AST 41 (H) 07/07/2017   NA 134 (L) 07/07/2017   K 4.2 07/07/2017  CL 100 07/07/2017   CREATININE 0.62 07/07/2017   BUN 10 07/07/2017   CO2 26 07/07/2017   TSH 1.94 07/07/2017   HGBA1C 10.4 (H) 07/07/2017    Mm Digital Screening  Result Date: 04/06/2012 DG SCREEN MAMMOGRAM BILATERAL Bilateral CC and MLO view(s) were taken. DIGITAL SCREENING MAMMOGRAM WITH CAD: The breast tissue is almost entirely fatty.  No masses or malignant type calcifications are identified. Images were processed with CAD. IMPRESSION: No specific mammographic evidence of malignancy.  Next screening mammogram is recommended in one year. A result letter of this screening mammogram will be mailed directly to the patient. ASSESSMENT: Negative - BI-RADS 1 Screening mammogram in 1 year. ,   Assessment & Plan:   There are no diagnoses linked to this encounter. I have discontinued Ms. Eschete's Dulaglutide and Lorcaserin HCl. I am  also having her maintain her cyclobenzaprine, ALPRAZolam, traMADol, aspirin, Cholecalciferol, atorvastatin, buPROPion, citalopram, fenofibrate, metFORMIN, repaglinide, omeprazole, and empagliflozin.  No orders of the defined types were placed in this encounter.    Follow-up: No Follow-up on file.  [name], MD

## 2017-10-21 NOTE — Assessment & Plan Note (Addendum)
Labs  On Metformin, Prandin Jardiance

## 2017-10-21 NOTE — Assessment & Plan Note (Signed)
Severe Will inject Sports Med ref Percocet #10

## 2017-10-23 ENCOUNTER — Other Ambulatory Visit (INDEPENDENT_AMBULATORY_CARE_PROVIDER_SITE_OTHER): Payer: BC Managed Care – PPO

## 2017-10-23 DIAGNOSIS — IMO0002 Reserved for concepts with insufficient information to code with codable children: Secondary | ICD-10-CM

## 2017-10-23 DIAGNOSIS — E1165 Type 2 diabetes mellitus with hyperglycemia: Secondary | ICD-10-CM | POA: Diagnosis not present

## 2017-10-23 DIAGNOSIS — M25512 Pain in left shoulder: Secondary | ICD-10-CM | POA: Diagnosis not present

## 2017-10-23 LAB — BASIC METABOLIC PANEL
BUN: 13 mg/dL (ref 6–23)
CHLORIDE: 103 meq/L (ref 96–112)
CO2: 27 mEq/L (ref 19–32)
CREATININE: 0.67 mg/dL (ref 0.40–1.20)
Calcium: 9.2 mg/dL (ref 8.4–10.5)
GFR: 99.49 mL/min (ref >60.00)
GLUCOSE: 146 mg/dL — AB (ref 70–99)
POTASSIUM: 3.9 meq/L (ref 3.5–5.1)
Sodium: 136 mEq/L (ref 135–145)

## 2017-10-23 LAB — HEPATIC FUNCTION PANEL
ALBUMIN: 4.2 g/dL (ref 3.5–5.2)
ALK PHOS: 55 U/L (ref 39–117)
ALT: 31 U/L (ref 0–35)
AST: 31 U/L (ref 0–37)
Bilirubin, Direct: 0.1 mg/dL (ref 0.0–0.3)
TOTAL PROTEIN: 7.1 g/dL (ref 6.0–8.3)
Total Bilirubin: 0.6 mg/dL (ref 0.2–1.2)

## 2017-10-23 LAB — LIPID PANEL
CHOLESTEROL: 169 mg/dL (ref 0–200)
HDL: 41.8 mg/dL (ref >39.00)
LDL Cholesterol: 97 mg/dL (ref 0–99)
NONHDL: 126.92
Total CHOL/HDL Ratio: 4
Triglycerides: 149 mg/dL (ref 0.0–149.0)
VLDL: 29.8 mg/dL (ref 0.0–40.0)

## 2017-10-23 LAB — HEMOGLOBIN A1C: Hgb A1c MFr Bld: 7.4 % — ABNORMAL HIGH (ref 4.6–6.5)

## 2017-11-01 ENCOUNTER — Other Ambulatory Visit: Payer: Self-pay | Admitting: Internal Medicine

## 2017-11-09 ENCOUNTER — Encounter: Payer: Self-pay | Admitting: Internal Medicine

## 2017-11-10 MED ORDER — ATORVASTATIN CALCIUM 10 MG PO TABS: 10.0000 mg | ORAL_TABLET | Freq: Every day | ORAL | 0 refills | Status: DC

## 2017-11-10 MED ORDER — EMPAGLIFLOZIN 25 MG PO TABS: ORAL_TABLET | ORAL | 3 refills | Status: DC

## 2017-11-10 MED ORDER — CITALOPRAM HYDROBROMIDE 20 MG PO TABS: 20.0000 mg | ORAL_TABLET | Freq: Every day | ORAL | 0 refills | Status: DC

## 2017-11-10 MED ORDER — CYCLOBENZAPRINE HCL 5 MG PO TABS: ORAL_TABLET | ORAL | 0 refills | Status: DC

## 2017-11-26 ENCOUNTER — Ambulatory Visit (INDEPENDENT_AMBULATORY_CARE_PROVIDER_SITE_OTHER): Payer: BC Managed Care – PPO | Admitting: Internal Medicine

## 2017-11-26 ENCOUNTER — Encounter: Payer: Self-pay | Admitting: Internal Medicine

## 2017-11-26 VITALS — BP 132/82 | HR 95 | Temp 98.6°F | Resp 16 | Ht 66.25 in | Wt 225.0 lb

## 2017-11-26 DIAGNOSIS — J988 Other specified respiratory disorders: Secondary | ICD-10-CM | POA: Diagnosis not present

## 2017-11-26 MED ORDER — CEFDINIR 300 MG PO CAPS: 300.0000 mg | ORAL_CAPSULE | Freq: Two times a day (BID) | ORAL | 0 refills | Status: DC

## 2017-11-26 MED ORDER — HYDROCODONE-HOMATROPINE 5-1.5 MG/5ML PO SYRP: 5.0000 mL | ORAL_SOLUTION | Freq: Three times a day (TID) | ORAL | 0 refills | Status: DC | PRN

## 2017-11-26 NOTE — Patient Instructions (Signed)
Upper Respiratory Infection, Adult Most upper respiratory infections (URIs) are caused by a virus. A URI affects the nose, throat, and upper air passages. The most common type of URI is often called "the common cold." Follow these instructions at home:  Take medicines only as told by your doctor.  Gargle warm saltwater or take cough drops to comfort your throat as told by your doctor.  Use a warm mist humidifier or inhale steam from a shower to increase air moisture. This may make it easier to breathe.  Drink enough fluid to keep your pee (urine) clear or pale yellow.  Eat soups and other clear broths.  Have a healthy diet.  Rest as needed.  Go back to work when your fever is gone or your doctor says it is okay. ? You may need to stay home longer to avoid giving your URI to others. ? You can also wear a face mask and wash your hands often to prevent spread of the virus.  Use your inhaler more if you have asthma.  Do not use any tobacco products, including cigarettes, chewing tobacco, or electronic cigarettes. If you need help quitting, ask your doctor. Contact a doctor if:  You are getting worse, not better.  Your symptoms are not helped by medicine.  You have chills.  You are getting more short of breath.  You have brown or red mucus.  You have yellow or brown discharge from your nose.  You have pain in your face, especially when you bend forward.  You have a fever.  You have puffy (swollen) neck glands.  You have pain while swallowing.  You have white areas in the back of your throat. Get help right away if:  You have very bad or constant: ? Headache. ? Ear pain. ? Pain in your forehead, behind your eyes, and over your cheekbones (sinus pain). ? Chest pain.  You have long-lasting (chronic) lung disease and any of the following: ? Wheezing. ? Long-lasting cough. ? Coughing up blood. ? A change in your usual mucus.  You have a stiff neck.  You have  changes in your: ? Vision. ? Hearing. ? Thinking. ? Mood. This information is not intended to replace advice given to you by your health care provider. Make sure you discuss any questions you have with your health care provider. Document Released: 05/26/2008 Document Revised: 08/10/2016 Document Reviewed: 03/15/2014 Elsevier Interactive Patient Education  2018 [employer]  

## 2017-12-02 NOTE — Progress Notes (Signed)
Subjective:  Patient ID: [name] [name], female    DOB: [birth year]  Age: 51 y.o. MRN: [id number]  CC: URI   HPI [name] [name] presents for 2-week history of cough that is productive of thick yellow phlegm with chills but no fever.  She denies shortness of breath, wheezing, hemoptysis, or night sweats.  Outpatient Medications Prior to Visit  Medication Sig Dispense Refill  . ALPRAZolam (XANAX) 0.5 MG tablet TAKE ONE TABLET BY MOUTH TWICE DAILY 60 tablet 2  . aspirin 81 MG chewable tablet Chew 1 tablet (81 mg total) by mouth daily. 90 tablet 1  . atorvastatin (LIPITOR) 10 MG tablet Take 1 tablet (10 mg total) by mouth daily. 90 tablet 0  . buPROPion (WELLBUTRIN SR) 150 MG 12 hr tablet Take 1 tablet (150 mg total) by mouth 2 (two) times daily. 180 tablet 1  . Cholecalciferol (EQL VITAMIN D3) 1000 units tablet Take 1 tablet (1,000 Units total) by mouth daily. 90 tablet 1  . citalopram (CELEXA) 20 MG tablet Take 1 tablet (20 mg total) by mouth daily. 90 tablet 0  . cyclobenzaprine (FLEXERIL) 5 MG tablet 1 tablet at bedtime as needed 30 tablet 0  . empagliflozin (JARDIANCE) 25 MG TABS tablet Take 1 tablet by mouth once a day 30 tablet 3  . fenofibrate (TRICOR) 145 MG tablet Take 1 tablet (145 mg total) by mouth daily. 90 tablet 1  . metFORMIN (GLUCOPHAGE) 500 MG tablet TAKE 2 TABLETS BY MOUTH TWICE DAILY 180 tablet 1  . omeprazole (PRILOSEC) 40 MG capsule Take 1 capsule (40 mg total) by mouth daily. 90 capsule 1  . oxyCODONE-acetaminophen (ROXICET) 5-325 MG tablet Take 1 tablet by mouth 2 (two) times daily as needed for severe pain. 10 tablet 0  . repaglinide (PRANDIN) 2 MG tablet Take 0.5-1 tablets (1-2 mg total) by mouth 3 (three) times daily before meals. 270 tablet 1  . traMADol (ULTRAM) 50 MG tablet TAKE 1 TO 2 TABLETS BY MOUTH TWICE DAILY 60 tablet 2   No facility-administered medications prior to visit.     ROS Review of Systems  Constitutional: Positive for chills. Negative for  activity change, diaphoresis, fatigue and fever.  HENT: Negative for sore throat and trouble swallowing.   Eyes: Negative for visual disturbance.  Respiratory: Positive for cough. Negative for chest tightness, shortness of breath and wheezing.   Cardiovascular: Negative for chest pain, palpitations and leg swelling.  Gastrointestinal: Negative for abdominal pain, constipation, diarrhea, nausea and vomiting.  Endocrine: Negative.   Genitourinary: Negative.  Negative for difficulty urinating, dysuria and urgency.  Musculoskeletal: Negative.  Negative for back pain, myalgias and neck pain.  Skin: Negative.  Negative for color change and rash.  Allergic/Immunologic: Negative.   Neurological: Negative.  Negative for dizziness.  Hematological: Negative for adenopathy. Does not bruise/bleed easily.  Psychiatric/Behavioral: Negative.     Objective:  BP 132/82   Pulse 95   Temp 98.6 F (37 C) (Oral)   Resp 16   Ht 5' 6.25" (1.683 m)   Wt 225 lb (102.1 kg)   SpO2 98%   BMI 36.04 kg/m   BP Readings from Last 3 Encounters:  11/26/17 132/82  10/21/17 132/78  07/07/17 140/80    Wt Readings from Last 3 Encounters:  11/26/17 225 lb (102.1 kg)  10/21/17 227 lb (103 kg)  07/07/17 229 lb (103.9 kg)    Physical Exam  Constitutional: She is oriented to person, place, and time. No distress.  HENT:  Mouth/Throat:  Oropharynx is clear and moist. No oropharyngeal exudate.  Eyes: Conjunctivae are normal. Right eye exhibits no discharge. Left eye exhibits no discharge. No scleral icterus.  Neck: Normal range of motion. Neck supple. No JVD present. No thyromegaly present.  Cardiovascular: Normal rate, regular rhythm and intact distal pulses. Exam reveals no gallop and no friction rub.  No murmur heard. Pulmonary/Chest: Effort normal and breath sounds normal. No respiratory distress. She has no wheezes. She has no rales.  Abdominal: Soft. Bowel sounds are normal. She exhibits no distension. There  is no tenderness. There is no guarding.  Musculoskeletal: Normal range of motion. She exhibits no edema, tenderness or deformity.  Lymphadenopathy:    She has no cervical adenopathy.  Neurological: She is alert and oriented to person, place, and time.  Skin: Skin is warm and dry. No rash noted. She is not diaphoretic. No erythema. No pallor.  Vitals reviewed.   Lab Results  Component Value Date   WBC 7.4 07/07/2017   HGB 12.7 07/07/2017   HCT 38.4 07/07/2017   PLT 211.0 07/07/2017   GLUCOSE 146 (H) 10/23/2017   CHOL 169 10/23/2017   TRIG 149.0 10/23/2017   HDL 41.80 10/23/2017   LDLDIRECT 57.0 03/26/2017   LDLCALC 97 10/23/2017   ALT 31 10/23/2017   AST 31 10/23/2017   NA 136 10/23/2017   K 3.9 10/23/2017   CL 103 10/23/2017   CREATININE 0.67 10/23/2017   BUN 13 10/23/2017   CO2 27 10/23/2017   TSH 1.94 07/07/2017   HGBA1C 7.4 (H) 10/23/2017    Mm Digital Screening  Result Date: 04/06/2012 DG SCREEN MAMMOGRAM BILATERAL Bilateral CC and MLO view(s) were taken. DIGITAL SCREENING MAMMOGRAM WITH CAD: The breast tissue is almost entirely fatty.  No masses or malignant type calcifications are identified. Images were processed with CAD. IMPRESSION: No specific mammographic evidence of malignancy.  Next screening mammogram is recommended in one year. A result letter of this screening mammogram will be mailed directly to the patient. ASSESSMENT: Negative - BI-RADS 1 Screening mammogram in 1 year. ,   Assessment & Plan:   [name] [name] was seen today for uri.  Diagnoses and all orders for this visit:  RTI (respiratory tract infection)- I will treat the infection with a cephalosporin and will control the cough and other symptoms with Hycodan. -     cefdinir (OMNICEF) 300 MG capsule; Take 1 capsule (300 mg total) by mouth 2 (two) times daily. -     HYDROcodone-homatropine (HYCODAN) 5-1.5 MG/5ML syrup; Take 5 mLs by mouth every 8 (eight) hours as needed for cough.   I am having [name] S.  [name] start on cefdinir and HYDROcodone-homatropine. I am also having her maintain her aspirin, Cholecalciferol, buPROPion, fenofibrate, metFORMIN, repaglinide, omeprazole, oxyCODONE-acetaminophen, ALPRAZolam, traMADol, atorvastatin, citalopram, empagliflozin, and cyclobenzaprine.  Meds ordered this encounter  Medications  . cefdinir (OMNICEF) 300 MG capsule    Sig: Take 1 capsule (300 mg total) by mouth 2 (two) times daily.    Dispense:  20 capsule    Refill:  0  . HYDROcodone-homatropine (HYCODAN) 5-1.5 MG/5ML syrup    Sig: Take 5 mLs by mouth every 8 (eight) hours as needed for cough.    Dispense:  120 mL    Refill:  0     Follow-up: Return if symptoms worsen or fail to improve.  Scarlette Calico, MD

## 2017-12-26 NOTE — Progress Notes (Signed)
[name] [address], [place] [address] Phone: [phone] Subjective:    I'm seeing this patient by the request  of:  [name], [name], MD   CC: Left shoulder pain  TOI:[id number]  [name] [name] is a 52 y.o. female coming in with complaint of left shoulder pain. She has been having pain for the past 2 months. Patient has intermittent pain. The pain is in the posterior aspect of her shoulder. 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 She does have pain while sleeping as well. Pain character is sharp and throbbing. Does have numbness and tingling into the tricep. Has tried icing and IBU. She also tried Tramadol and another pain med from a tooth surgery.  Patient has noticed decreasing range of motion.  Rates the severity as 7 out of 10.  Starting affecting daily activities.  Ice is only been intermittently helpful.  Has tried over-the-counter medications with mild improvement.       Past Medical History:  Diagnosis Date  . Anxiety   . CHF (congestive heart failure) ([facility]) 2005   peripartum cardiomyopathy  . Depression   . Diabetes mellitus    type II  . 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 Hydradenitis    right   Past Surgical History:  Procedure Laterality Date  . CHOLECYSTECTOMY  2006   Social History   Socioeconomic History  . Marital status: Married    Spouse name: None  . Number of children: None  . Years of education: None  . Highest education level: None  Social Needs  . Financial resource strain: None  . Food insecurity - worry: None  . Food insecurity - inability: None  . Transportation needs - medical: None  . Transportation needs - non-medical: None  Occupational History  . None  Tobacco Use  . Smoking status: Light Tobacco Smoker    Packs/day: 0.50    Years: 15.00    Pack years: 7.50  . Smokeless tobacco: Never Used  Substance and Sexual Activity  . Alcohol use: No  . Drug use: No  . Sexual activity:  Yes  Other Topics Concern  . None  Social History Narrative  . None   No Known Allergies Family History  Problem Relation Age of Onset  . Diabetes Mother   . Hypertension Mother   . Hypertension Other      Past medical history, social, surgical and family history all reviewed in electronic medical record.  No pertanent information unless stated regarding to the chief complaint.   Review of Systems:Review of systems updated and as accurate as of 12/28/17  No headache, visual changes, nausea, vomiting, diarrhea, constipation, dizziness, abdominal pain, skin rash, fevers, chills, night sweats, weight loss, swollen lymph nodes, body aches, joint swelling,chest pain, shortness of breath, mood changes.  Positive muscle aches  Objective  Blood pressure 110/72, pulse 96, height 5' 7.5" (1.715 m), weight 225 lb (102.1 kg), SpO2 97 %. Systems examined below as of 12/28/17   General: No apparent distress alert and oriented x3 mood and affect normal, dressed appropriately.  HEENT: Pupils equal, extraocular movements intact  Respiratory: Patient's speak in full sentences and does not appear short of breath  Cardiovascular: No lower extremity edema, non tender, no erythema  Skin: Warm dry intact with no signs of infection or rash on extremities or on axial skeleton.  Abdomen: Soft nontender  Neuro: Cranial nerves II through XII are intact, neurovascularly intact in  all extremities with 2+ DTRs and 2+ pulses.  Lymph: No lymphadenopathy of posterior or anterior cervical chain or axillae bilaterally.  Gait normal with good balance and coordination.  MSK:  Non tender with full range of motion and good stability and symmetric strength and tone of elbows, wrist, hip, knee and ankles bilaterally.  Shoulder: left Inspection reveals no abnormalities, atrophy or asymmetry. Palpation is normal with no tenderness over AC joint or bicipital groove. Decreased range of motion actively.  Passively near full  range of motion but difficulty because of patient guarding Rotator cuff strength 4 out of 5 compared to the contralateral side signs of impingement with positive Neer and Hawkin's tests, but negative empty can sign. Speeds and Yergason's tests normal. No labral pathology noted with negative Obrien's, negative clunk and good stability. Normal scapular function observed. No painful arc and no drop arm sign. No apprehension sign Contralateral shoulder unremarkable  MSK US performed of: left This study was ordered, performed, and interpreted by Charlann Boxer D.O.  Shoulder:   Supraspinatus: Mild intrasubstance of the rotator cuff tear bursal bulge seen with shoulder abduction on impingement view. Infraspinatus:  Appears normal on long and transverse views. Significant increase in Doppler flow Subscapularis:  Appears normal on long and transverse views. Positive bursa AC joint: Mild arthritis Glenohumeral Joint:  Appears normal without effusion. Glenoid Labrum:  Intact without visualized tears. Biceps Tendon:  Appears normal on long and transverse views, no fraying of tendon, tendon located in intertubercular groove, no subluxation with shoulder internal or external rotation.  Impression: Subacromial bursitis, intrasubstance rotator cuff tear  Procedure: Real-time Ultrasound Guided Injection of left glenohumeral joint Device: GE Logiq E  Ultrasound guided injection is preferred based studies that show increased duration, increased effect, greater accuracy, decreased procedural pain, increased response rate with ultrasound guided versus blind injection.  Verbal informed consent obtained.  Time-out conducted.  Noted no overlying erythema, induration, or other signs of local infection.  Skin prepped in a sterile fashion.  Local anesthesia: Topical Ethyl chloride.  With sterile technique and under real time ultrasound guidance:  Joint visualized.  23g 1  inch needle inserted posterior approach.  Pictures taken for needle placement. Patient did have injection of 2 cc of 1% lidocaine, 2 cc of 0.5% Marcaine, and 1.0 cc of Kenalog 40 mg/dL. Completed without difficulty  Pain immediately resolved suggesting accurate placement of the medication.  Advised to call if fevers/chills, erythema, induration, drainage, or persistent bleeding.  Images permanently stored and available for review in the ultrasound unit.  Impression: Technically successful ultrasound guided injection.   97110; 15 additional minutes spent for Therapeutic exercises as stated in above notes.  This included exercises focusing on stretching, strengthening, with significant focus on eccentric aspects.   Long term goals include an improvement in range of motion, strength, endurance as well as avoiding reinjury. Patient's frequency would include in 1-2 times a day, 3-5 times a week for a duration of 6-12 weeks.  Shoulder Exercises that included:  Basic scapular stabilization to include adduction and depression of scapula Scaption, focusing on proper movement and good control Internal and External rotation utilizing a theraband, with elbow tucked at side entire time Rows with theraband which was given today   Proper technique shown and discussed handout in great detail with ATC.  All questions were discussed and answered.     Impression and Recommendations:     This case required medical decision making of moderate complexity.      Note:  This dictation was prepared with Dragon dictation along with smaller phrase technology. Any transcriptional errors that result from this process are unintentional.

## 2017-12-28 ENCOUNTER — Ambulatory Visit: Payer: Self-pay

## 2017-12-28 ENCOUNTER — Encounter: Payer: Self-pay | Admitting: Family Medicine

## 2017-12-28 ENCOUNTER — Ambulatory Visit (INDEPENDENT_AMBULATORY_CARE_PROVIDER_SITE_OTHER): Payer: BC Managed Care – PPO | Admitting: Family Medicine

## 2017-12-28 VITALS — BP 110/72 | HR 96 | Ht 67.5 in | Wt 225.0 lb

## 2017-12-28 DIAGNOSIS — G8929 Other chronic pain: Secondary | ICD-10-CM

## 2017-12-28 DIAGNOSIS — M25512 Pain in left shoulder: Principal | ICD-10-CM

## 2017-12-28 DIAGNOSIS — M75102 Unspecified rotator cuff tear or rupture of left shoulder, not specified as traumatic: Secondary | ICD-10-CM | POA: Insufficient documentation

## 2017-12-28 MED ORDER — DICLOFENAC SODIUM 2 % TD SOLN: 2.0000 g | Freq: Two times a day (BID) | TRANSDERMAL | 3 refills | Status: DC

## 2017-12-28 MED ORDER — NITROGLYCERIN 0.2 MG/HR TD PT24: MEDICATED_PATCH | TRANSDERMAL | 1 refills | Status: DC

## 2017-12-28 NOTE — Patient Instructions (Signed)
Good to see you  Ice 20 minutes 2 times daily. Usually after activity and before bed. pennsaid pinkie amount topically 2 times daily as needed.  Nitroglycerin Protocol   Apply 1/4 nitroglycerin patch to affected area daily.  Change position of patch within the affected area every 24 hours.  You may experience a headache during the first 1-2 weeks of using the patch, these should subside.  If you experience headaches after beginning nitroglycerin patch treatment, you may take your preferred over the counter pain reliever.  Another side effect of the nitroglycerin patch is skin irritation or rash related to patch adhesive.  Please notify our office if you develop more severe headaches or rash, and stop the patch.  Tendon healing with nitroglycerin patch may require 12 to 24 weeks depending on the extent of injury.  Men should not use if taking Viagra, Cialis, or Levitra.   Do not use if you have migraines or rosacea.   Keep hands within peripheral vision.  Marland KitchenzexeSee me again in 4 weeks

## 2017-12-28 NOTE — Assessment & Plan Note (Signed)
Patient found to have some mild limitation in range of motion of noted on exam today..  We discussed icing regimen and home exercises.  We discussed proper lifting mechanics.  Patient has pain medications from breakthrough pain.  Follow-up with me again in 4 weeks

## 2018-01-13 ENCOUNTER — Other Ambulatory Visit: Payer: Self-pay

## 2018-01-13 MED ORDER — OMEPRAZOLE 40 MG PO CPDR: 40.0000 mg | DELAYED_RELEASE_CAPSULE | Freq: Every day | ORAL | 1 refills | Status: DC

## 2018-01-24 NOTE — Progress Notes (Signed)
[name] [address], [place] [address] Phone: [phone] Subjective:     CC: Left shoulder follow-up  SWF:[id number]  CLARENE CURRAN is a 52 y.o. female coming in with complaint of left shoulder pain.  Patient was found to have a rotator cuff tear with loss of range of motion.  We will give her an injection December 28, 2017.  Patient wants to try exercises, icing regimen, topical anti-inflammatories.  Patient states 80% better.  States that noticing some increasing range of motion.  Though continues to have some pain.  Still has weakness compared to contralateral side.    Past Medical History:  Diagnosis Date  . Anxiety   . CHF (congestive heart failure) ([place]) 2005   peripartum cardiomyopathy  . Depression   . Diabetes mellitus    type II  . 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 Hydradenitis    right   Past Surgical History:  Procedure Laterality Date  . CHOLECYSTECTOMY  2006   Social History   Socioeconomic History  . Marital status: Married    Spouse name: None  . Number of children: None  . Years of education: None  . Highest education level: None  Social Needs  . Financial resource strain: None  . Food insecurity - worry: None  . Food insecurity - inability: None  . Transportation needs - medical: None  . Transportation needs - non-medical: None  Occupational History  . None  Tobacco Use  . Smoking status: Light Tobacco Smoker    Packs/day: 0.50    Years: 15.00    Pack years: 7.50  . Smokeless tobacco: Never Used  Substance and Sexual Activity  . Alcohol use: No  . Drug use: No  . Sexual activity: Yes  Other Topics Concern  . None  Social History Narrative  . None   No Known Allergies Family History  Problem Relation Age of Onset  . Diabetes Mother   . Hypertension Mother   . Hypertension Other      Past medical history, social, surgical and family history all reviewed in electronic medical record.  No pertanent  information unless stated regarding to the chief complaint.   Review of Systems:Review of systems updated and as accurate as of 01/25/18  No headache, visual changes, nausea, vomiting, diarrhea, constipation, dizziness, abdominal pain, skin rash, fevers, chills, night sweats, weight loss, swollen lymph nodes, body aches, joint swelling, muscle aches, chest pain, shortness of breath, mood changes.   Objective  Blood pressure (!) 162/80, pulse 92, height 5\' 7"  (1.702 m), SpO2 98 %. Systems examined below as of 01/25/18   General: No apparent distress alert and oriented x3 mood and affect normal, dressed appropriately.  HEENT: Pupils equal, extraocular movements intact  Respiratory: Patient's speak in full sentences and does not appear short of breath  Cardiovascular: No lower extremity edema, non tender, no erythema  Skin: Warm dry intact with no signs of infection or rash on extremities or on axial skeleton.  Abdomen: Soft nontender  Neuro: Cranial nerves II through XII are intact, neurovascularly intact in all extremities with 2+ DTRs and 2+ pulses.  Lymph: No lymphadenopathy of posterior or anterior cervical chain or axillae bilaterally.  Gait normal with good balance and coordination.  MSK:  Non tender with full range of motion and good stability and symmetric strength and tone of elbows, wrist, hip, knee and ankles bilaterally. Shoulder: Left Inspection reveals no abnormalities, atrophy or asymmetry. Palpation is  normal with no tenderness over AC joint or bicipital groove. ROM still shows some limited range of motion and forward flexion lacking last 10 degrees passively.  Actively patient only has 90 degrees of forward flexion. Rotator cuff strength 4 out of 5 compared to the contralateral side Positive impingement Speeds and Yergason's tests normal. No labral pathology noted with negative Obrien's, negative clunk and good stability. Normal scapular function observed. Positive painful  arc No apprehension sign Contralateral shoulder unremarkable  MSK US performed of: Left shoulder This study was ordered, performed, and interpreted by Charlann Boxer D.O.  Shoulder:   Supraspinatus: Tear noted.  No significant retraction noted.  Possible small amount of healing interval noted compared to previous examBiceps Tendon:  Appears normal on long and transverse views, no fraying of tendon, tendon located in intertubercular groove, no subluxation with shoulder internal or external rotation. No increased power doppler signal. Impression: Mild healing of the rotator cuff.     Impression and Recommendations:     This case required medical decision making of moderate complexity.      Note: This dictation was prepared with Dragon dictation along with smaller phrase technology. Any transcriptional errors that result from this process are unintentional.

## 2018-01-25 ENCOUNTER — Ambulatory Visit (INDEPENDENT_AMBULATORY_CARE_PROVIDER_SITE_OTHER): Payer: BC Managed Care – PPO | Admitting: Family Medicine

## 2018-01-25 ENCOUNTER — Encounter: Payer: Self-pay | Admitting: Family Medicine

## 2018-01-25 ENCOUNTER — Ambulatory Visit: Payer: Self-pay

## 2018-01-25 VITALS — BP 162/80 | HR 92 | Ht 67.0 in

## 2018-01-25 DIAGNOSIS — G8929 Other chronic pain: Secondary | ICD-10-CM

## 2018-01-25 DIAGNOSIS — M75102 Unspecified rotator cuff tear or rupture of left shoulder, not specified as traumatic: Secondary | ICD-10-CM

## 2018-01-25 DIAGNOSIS — M25511 Pain in right shoulder: Secondary | ICD-10-CM | POA: Diagnosis not present

## 2018-01-25 NOTE — Patient Instructions (Signed)
Good to see you  Stay active Start working on the movement Try to do the nitro to the left side  Overall I am optimistic See me again in 4 weeks.

## 2018-01-25 NOTE — Assessment & Plan Note (Signed)
Has made some improvement.  Some mild.  That this will be beneficial.  I declined to do before in the past.  Also declined the shoulder x-rays.  Encourage her to continue the home exercises of the up with me again 4 weeks

## 2018-02-21 NOTE — Progress Notes (Signed)
[name] [address] [place], [place] [address] Phone: [phone] Subjective:    I'm seeing this patient by the request  of:    CC: Left shoulder follow-up  [name] [name]  [name] [name] is a 52 y.o. female coming in with complaint of left shoulder follow-up.  Found to have a rotator cuff tear.  Patient declined formal physical therapy.  Was 80% better than last exam well.  Patient states that there was still weakness at that time.  Patient was to continue with conservative therapy.  Patient states feeling 100% better with the pain.  Still has some decreasing range of motion though.     Past Medical History:  Diagnosis Date  . Anxiety   . CHF (congestive heart failure) ([facility]) 2005   peripartum cardiomyopathy  . Depression   . Diabetes mellitus    type II  . 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 Hydradenitis    right   Past Surgical History:  Procedure Laterality Date  . CHOLECYSTECTOMY  2006   Social History   Socioeconomic History  . Marital status: Married    Spouse name: None  . Number of children: None  . Years of education: None  . Highest education level: None  Social Needs  . Financial resource strain: None  . Food insecurity - worry: None  . Food insecurity - inability: None  . Transportation needs - medical: None  . Transportation needs - non-medical: None  Occupational History  . None  Tobacco Use  . Smoking status: Light Tobacco Smoker    Packs/day: 0.50    Years: 15.00    Pack years: 7.50  . Smokeless tobacco: Never Used  Substance and Sexual Activity  . Alcohol use: No  . Drug use: No  . Sexual activity: Yes  Other Topics Concern  . None  Social History Narrative  . None   No Known Allergies Family History  Problem Relation Age of Onset  . Diabetes Mother   . Hypertension Mother   . Hypertension Other      Past medical history, social, surgical and family history all reviewed in electronic medical record.  No  pertanent information unless stated regarding to the chief complaint.   Review of Systems:Review of systems updated and as accurate as of 02/22/18  No headache, visual changes, nausea, vomiting, diarrhea, constipation, dizziness, abdominal pain, skin rash, fevers, chills, night sweats, weight loss, swollen lymph nodes, body aches, joint swelling, muscle aches, chest pain, shortness of breath, mood changes.   Objective  Blood pressure (!) 150/80, pulse (!) 102, height 5\' 7"  (1.702 m), weight 234 lb (106.1 kg), SpO2 98 %. Systems examined below as of 02/22/18   General: No apparent distress alert and oriented x3 mood and affect normal, dressed appropriately.  HEENT: Pupils equal, extraocular movements intact  Respiratory: Patient's speak in full sentences and does not appear short of breath  Cardiovascular: No lower extremity edema, non tender, no erythema  Skin: Warm dry intact with no signs of infection or rash on extremities or on axial skeleton.  Abdomen: Soft nontender  Neuro: Cranial nerves II through XII are intact, neurovascularly intact in all extremities with 2+ DTRs and 2+ pulses.  Lymph: No lymphadenopathy of posterior or anterior cervical chain or axillae bilaterally.  Gait normal with good balance and coordination.  MSK:  Non tender with full range of motion and good stability and symmetric strength and tone of  elbows, wrist, hip, knee and  ankles bilaterally.   Shoulder: Left Inspection reveals no abnormalities, atrophy or asymmetry. Palpation is normal with no tenderness over AC joint or bicipital groove. ROM lacks last 5 degrees of forward flexion, he has minimal external rotation. Rotator cuff strength 4 out of 5 compared to contralateral side. No signs of impingement with negative Neer and Hawkin's tests, empty can sign. Speeds and Yergason's tests normal. No labral pathology noted with negative Obrien's, negative clunk and good stability. Normal scapular function  observed. No painful arc and no drop arm sign. No apprehension sign      Impression and Recommendations:     This case required medical decision making of moderate complexity.      Note: This dictation was prepared with Dragon dictation along with smaller phrase technology. Any transcriptional errors that result from this process are unintentional.

## 2018-02-22 ENCOUNTER — Encounter: Payer: Self-pay | Admitting: Internal Medicine

## 2018-02-22 ENCOUNTER — Ambulatory Visit (INDEPENDENT_AMBULATORY_CARE_PROVIDER_SITE_OTHER): Payer: BC Managed Care – PPO | Admitting: Family Medicine

## 2018-02-22 ENCOUNTER — Encounter: Payer: Self-pay | Admitting: Family Medicine

## 2018-02-22 ENCOUNTER — Ambulatory Visit (INDEPENDENT_AMBULATORY_CARE_PROVIDER_SITE_OTHER): Payer: BC Managed Care – PPO | Admitting: Internal Medicine

## 2018-02-22 ENCOUNTER — Other Ambulatory Visit (INDEPENDENT_AMBULATORY_CARE_PROVIDER_SITE_OTHER): Payer: BC Managed Care – PPO

## 2018-02-22 ENCOUNTER — Ambulatory Visit: Payer: Self-pay

## 2018-02-22 VITALS — BP 142/76 | HR 96 | Temp 98.1°F | Ht 67.0 in | Wt 234.0 lb

## 2018-02-22 VITALS — BP 150/80 | HR 102 | Ht 67.0 in | Wt 234.0 lb

## 2018-02-22 DIAGNOSIS — D508 Other iron deficiency anemias: Secondary | ICD-10-CM | POA: Diagnosis not present

## 2018-02-22 DIAGNOSIS — M25512 Pain in left shoulder: Principal | ICD-10-CM

## 2018-02-22 DIAGNOSIS — G8929 Other chronic pain: Secondary | ICD-10-CM | POA: Diagnosis not present

## 2018-02-22 DIAGNOSIS — IMO0002 Reserved for concepts with insufficient information to code with codable children: Secondary | ICD-10-CM

## 2018-02-22 DIAGNOSIS — Z23 Encounter for immunization: Secondary | ICD-10-CM | POA: Diagnosis not present

## 2018-02-22 DIAGNOSIS — E1165 Type 2 diabetes mellitus with hyperglycemia: Secondary | ICD-10-CM | POA: Diagnosis not present

## 2018-02-22 DIAGNOSIS — M75102 Unspecified rotator cuff tear or rupture of left shoulder, not specified as traumatic: Secondary | ICD-10-CM | POA: Diagnosis not present

## 2018-02-22 LAB — BASIC METABOLIC PANEL
BUN: 16 mg/dL (ref 6–23)
CALCIUM: 9.4 mg/dL (ref 8.4–10.5)
CO2: 25 mEq/L (ref 19–32)
Chloride: 102 mEq/L (ref 96–112)
Creatinine, Ser: 0.71 mg/dL (ref 0.40–1.20)
GFR: 92.93 mL/min (ref >60.00)
Glucose, Bld: 159 mg/dL — ABNORMAL HIGH (ref 70–99)
POTASSIUM: 3.7 meq/L (ref 3.5–5.1)
SODIUM: 136 meq/L (ref 135–145)

## 2018-02-22 LAB — HEMOGLOBIN A1C: HEMOGLOBIN A1C: 8.1 % — AB (ref 4.6–6.5)

## 2018-02-22 MED ORDER — REPAGLINIDE 2 MG PO TABS: 1.0000 mg | ORAL_TABLET | Freq: Three times a day (TID) | ORAL | 3 refills | Status: DC

## 2018-02-22 MED ORDER — OMEPRAZOLE 40 MG PO CPDR: 40.0000 mg | DELAYED_RELEASE_CAPSULE | Freq: Every day | ORAL | 1 refills | Status: DC

## 2018-02-22 MED ORDER — BUPROPION HCL ER (SR) 150 MG PO TB12: 150.0000 mg | ORAL_TABLET | Freq: Two times a day (BID) | ORAL | 3 refills | Status: DC

## 2018-02-22 MED ORDER — EMPAGLIFLOZIN 25 MG PO TABS: ORAL_TABLET | ORAL | 3 refills | Status: DC

## 2018-02-22 MED ORDER — FENOFIBRATE 145 MG PO TABS: 145.0000 mg | ORAL_TABLET | Freq: Every day | ORAL | 1 refills | Status: DC

## 2018-02-22 MED ORDER — CITALOPRAM HYDROBROMIDE 20 MG PO TABS: 20.0000 mg | ORAL_TABLET | Freq: Every day | ORAL | 3 refills | Status: DC

## 2018-02-22 MED ORDER — METFORMIN HCL 500 MG PO TABS: ORAL_TABLET | ORAL | 1 refills | Status: DC

## 2018-02-22 MED ORDER — ATORVASTATIN CALCIUM 10 MG PO TABS: 10.0000 mg | ORAL_TABLET | Freq: Every day | ORAL | 3 refills | Status: DC

## 2018-02-22 NOTE — Progress Notes (Signed)
Subjective:  Patient ID: [name] [name], female    DOB: [birth year]  Age: 52 y.o. MRN: [id number]  CC: No chief complaint on file.   HPI [name] [name] presents for DM, HTN, CHF f/u  Outpatient Medications Prior to Visit  Medication Sig Dispense Refill  . ALPRAZolam (XANAX) 0.5 MG tablet TAKE ONE TABLET BY MOUTH TWICE DAILY 60 tablet 2  . aspirin 81 MG chewable tablet Chew 1 tablet (81 mg total) by mouth daily. 90 tablet 1  . atorvastatin (LIPITOR) 10 MG tablet Take 1 tablet (10 mg total) by mouth daily. 90 tablet 0  . buPROPion (WELLBUTRIN SR) 150 MG 12 hr tablet Take 1 tablet (150 mg total) by mouth 2 (two) times daily. 180 tablet 1  . cefdinir (OMNICEF) 300 MG capsule Take 1 capsule (300 mg total) by mouth 2 (two) times daily. 20 capsule 0  . Cholecalciferol (EQL VITAMIN D3) 1000 units tablet Take 1 tablet (1,000 Units total) by mouth daily. 90 tablet 1  . citalopram (CELEXA) 20 MG tablet Take 1 tablet (20 mg total) by mouth daily. 90 tablet 0  . cyclobenzaprine (FLEXERIL) 5 MG tablet 1 tablet at bedtime as needed 30 tablet 0  . Diclofenac Sodium 2 % SOLN Place 2 g onto the skin 2 (two) times daily. 112 g 3  . empagliflozin (JARDIANCE) 25 MG TABS tablet Take 1 tablet by mouth once a day 30 tablet 3  . fenofibrate (TRICOR) 145 MG tablet Take 1 tablet (145 mg total) by mouth daily. 90 tablet 1  . HYDROcodone-homatropine (HYCODAN) 5-1.5 MG/5ML syrup Take 5 mLs by mouth every 8 (eight) hours as needed for cough. 120 mL 0  . metFORMIN (GLUCOPHAGE) 500 MG tablet TAKE 2 TABLETS BY MOUTH TWICE DAILY 180 tablet 1  . nitroGLYCERIN (NITRODUR - DOSED IN MG/24 HR) 0.2 mg/hr patch 1/4 patch daily 30 patch 1  . omeprazole (PRILOSEC) 40 MG capsule Take 1 capsule (40 mg total) by mouth daily. 90 capsule 1  . oxyCODONE-acetaminophen (ROXICET) 5-325 MG tablet Take 1 tablet by mouth 2 (two) times daily as needed for severe pain. 10 tablet 0  . repaglinide (PRANDIN) 2 MG tablet Take 0.5-1 tablets (1-2  mg total) by mouth 3 (three) times daily before meals. 270 tablet 1  . traMADol (ULTRAM) 50 MG tablet TAKE 1 TO 2 TABLETS BY MOUTH TWICE DAILY 60 tablet 2   No facility-administered medications prior to visit.     ROS Review of Systems  Constitutional: Positive for unexpected weight change. Negative for activity change, appetite change, chills and fatigue.  HENT: Negative for congestion, mouth sores and sinus pressure.   Eyes: Negative for visual disturbance.  Respiratory: Negative for cough and chest tightness.   Gastrointestinal: Negative for abdominal pain and nausea.  Genitourinary: Negative for difficulty urinating, frequency and vaginal pain.  Musculoskeletal: Negative for back pain and gait problem.  Skin: Negative for pallor and rash.  Neurological: Negative for dizziness, tremors, weakness, numbness and headaches.  Psychiatric/Behavioral: Negative for confusion and sleep disturbance.    Objective:  BP (!) 142/76 (BP Location: Left Arm, Patient Position: Sitting, Cuff Size: Large)   Pulse 96   Temp 98.1 F (36.7 C) (Oral)   Ht 5\' 7"  (1.702 m)   Wt 234 lb (106.1 kg)   SpO2 99%   BMI 36.65 kg/m   BP Readings from Last 3 Encounters:  02/22/18 (!) 142/76  02/22/18 (!) 150/80  01/25/18 (!) 162/80    Wt Readings from  Last 3 Encounters:  02/22/18 234 lb (106.1 kg)  02/22/18 234 lb (106.1 kg)  12/28/17 225 lb (102.1 kg)    Physical Exam  Constitutional: She appears well-developed. No distress.  HENT:  Head: Normocephalic.  Right Ear: External ear normal.  Left Ear: External ear normal.  Nose: Nose normal.  Mouth/Throat: Oropharynx is clear and moist.  Eyes: Conjunctivae are normal. Pupils are equal, round, and reactive to light. Right eye exhibits no discharge. Left eye exhibits no discharge.  Neck: Normal range of motion. Neck supple. No JVD present. No tracheal deviation present. No thyromegaly present.  Cardiovascular: Normal rate, regular rhythm and normal  heart sounds.  Pulmonary/Chest: No stridor. No respiratory distress. She has no wheezes.  Abdominal: Soft. Bowel sounds are normal. She exhibits no distension and no mass. There is no tenderness. There is no rebound and no guarding.  Musculoskeletal: She exhibits no edema or tenderness.  Lymphadenopathy:    She has no cervical adenopathy.  Neurological: She displays normal reflexes. No cranial nerve deficit. She exhibits normal muscle tone. Coordination normal.  Skin: No rash noted. No erythema.  Psychiatric: She has a normal mood and affect. Her behavior is normal. Judgment and thought content normal.  Obese  Lab Results  Component Value Date   WBC 7.4 07/07/2017   HGB 12.7 07/07/2017   HCT 38.4 07/07/2017   PLT 211.0 07/07/2017   GLUCOSE 146 (H) 10/23/2017   CHOL 169 10/23/2017   TRIG 149.0 10/23/2017   HDL 41.80 10/23/2017   LDLDIRECT 57.0 03/26/2017   LDLCALC 97 10/23/2017   ALT 31 10/23/2017   AST 31 10/23/2017   NA 136 10/23/2017   K 3.9 10/23/2017   CL 103 10/23/2017   CREATININE 0.67 10/23/2017   BUN 13 10/23/2017   CO2 27 10/23/2017   TSH 1.94 07/07/2017   HGBA1C 7.4 (H) 10/23/2017    Mm Digital Screening  Result Date: 04/06/2012 DG SCREEN MAMMOGRAM BILATERAL Bilateral CC and MLO view(s) were taken. DIGITAL SCREENING MAMMOGRAM WITH CAD: The breast tissue is almost entirely fatty.  No masses or malignant type calcifications are identified. Images were processed with CAD. IMPRESSION: No specific mammographic evidence of malignancy.  Next screening mammogram is recommended in one year. A result letter of this screening mammogram will be mailed directly to the patient. ASSESSMENT: Negative - BI-RADS 1 Screening mammogram in 1 year. ,   Assessment & Plan:   There are no diagnoses linked to this encounter. I am having [name] [name] maintain her aspirin, Cholecalciferol, buPROPion, fenofibrate, metFORMIN, repaglinide, oxyCODONE-acetaminophen, ALPRAZolam, traMADol,  atorvastatin, citalopram, empagliflozin, cyclobenzaprine, cefdinir, HYDROcodone-homatropine, nitroGLYCERIN, Diclofenac Sodium, and omeprazole.  No orders of the defined types were placed in this encounter.    Follow-up: No Follow-up on file.  [name], MD

## 2018-02-22 NOTE — Addendum Note (Signed)
Addended by: Karren Cobble on: 02/22/2018 04:34 PM   Modules accepted: Orders

## 2018-02-22 NOTE — Assessment & Plan Note (Signed)
Patient is improving but very slowly.  Discussed with patient in great length about icing regimen, as well as this being a potential for reactive frozen shoulder.  Patient is going to try to increase range of motion.  Patient is happy that she does not have any pain.  We discussed him that if it does not seem to improve quickly that this can cause more discomfort and pain.  Follow-up again 8 weeks

## 2018-02-22 NOTE — Patient Instructions (Signed)
Good to see you  Ice is still your friend.  Keep moving the shoulder Nitro 2 weeks daily Then 3 times a week for 2 weeks then discontinue Try to keep hands within peripheral vision  See me again in 8 weeks if not perfect and call us at [phone] if worsening

## 2018-03-04 ENCOUNTER — Other Ambulatory Visit: Payer: Self-pay

## 2018-03-04 MED ORDER — EMPAGLIFLOZIN 25 MG PO TABS: 25.0000 mg | ORAL_TABLET | Freq: Every day | ORAL | 3 refills | Status: DC

## 2018-05-09 ENCOUNTER — Telehealth: Payer: BC Managed Care – PPO | Admitting: Physician Assistant

## 2018-05-09 DIAGNOSIS — H6690 Otitis media, unspecified, unspecified ear: Secondary | ICD-10-CM

## 2018-05-09 MED ORDER — AMOXICILLIN-POT CLAVULANATE 875-125 MG PO TABS: 1.0000 | ORAL_TABLET | Freq: Two times a day (BID) | ORAL | 0 refills | Status: DC

## 2018-05-09 NOTE — Progress Notes (Signed)
E Visit for Swimmer's Ear  We are sorry that you are not feeling well. Here is how we plan to help!  Based on what you have told me you may have a bacterial infection. I have prescribed an oral antibiotic: and Augmentin 875-125 mg one tablet by mouth twice a day for 10 days  In certain cases swimmer's ear may progress to a more serious bacterial infection of the middle or inner ear.  If you have a fever 102 and up and significantly worsening symptoms, this could indicate a more serious infection moving to the middle/inner and needs face to face evaluation in an office by a provider.  Your symptoms should improve over the next 3 days and should resolve in about 7 days.  HOME CARE:   Wash your hands frequently.  Do not place the tip of the bottle on your ear or touch it with your fingers.  You can take Acetominophen 650 mg every 4-6 hours as needed for pain.  If pain is severe or moderate, you can apply a heating pad (set on low) or hot water bottle (wrapped in a towel) to outer ear for 20 minutes.  This will also increase drainage.  Avoid ear plugs  Do not use Q-tips  After showers, help the water run out by tilting your head to one side.  GET HELP RIGHT AWAY IF:   Fever is over 102.2 degrees.  You develop progressive ear pain or hearing loss.  Ear symptoms persist longer than 3 days after treatment.  MAKE SURE YOU:   Understand these instructions.  Will watch your condition.  Will get help right away if you are not doing well or get worse.  TO PREVENT SWIMMER'S EAR:  Use a bathing cap or custom fitted swim molds to keep your ears dry.  Towel off after swimming to dry your ears.  Tilt your head or pull your earlobes to allow the water to escape your ear canal.  If there is still water in your ears, consider using a hairdryer on the lowest setting.  Thank you for choosing an e-visit. Your e-visit answers were reviewed by a board certified advanced clinical  practitioner to complete your personal care plan. Depending upon the condition, your plan could have included both over the counter or prescription medications. Please review your pharmacy choice. Be sure that the pharmacy you have chosen is open so that you can pick up your prescription now.  If there is a problem you may message your provider in [place] to have the prescription routed to another pharmacy. Your safety is important to [place]. If you have drug allergies check your prescription carefully.  For the next 24 hours, you can use MyChart to ask questions about today's visit, request a non-urgent call back, or ask for a work or school excuse from your e-visit provider. You will get an email in the next two days asking about your experience. I hope that your e-visit has been valuable and will speed your recovery.

## 2018-05-12 ENCOUNTER — Other Ambulatory Visit: Payer: Self-pay

## 2018-05-12 MED ORDER — FENOFIBRATE 145 MG PO TABS: 145.0000 mg | ORAL_TABLET | Freq: Every day | ORAL | 1 refills | Status: DC

## 2018-05-18 ENCOUNTER — Encounter: Payer: Self-pay | Admitting: Internal Medicine

## 2018-05-19 MED ORDER — METFORMIN HCL 500 MG PO TABS: ORAL_TABLET | ORAL | 1 refills | Status: DC

## 2018-05-27 ENCOUNTER — Telehealth: Payer: Self-pay

## 2018-05-27 NOTE — Telephone Encounter (Signed)
error 

## 2018-06-01 ENCOUNTER — Telehealth: Payer: Self-pay | Admitting: *Deleted

## 2018-06-01 MED ORDER — REPAGLINIDE 2 MG PO TABS: 2.0000 mg | ORAL_TABLET | Freq: Three times a day (TID) | ORAL | 2 refills | Status: DC

## 2018-06-01 NOTE — Telephone Encounter (Signed)
Rec'd call from [facility] pharmacist w/[facility] stating they received a fax back on pt Prandin refill stating an rx was sent back in March which has a year supply. [name] [name] states they never receive the script, and wanting to know can rx be resent. Inform [name] [name] will send electronically.Marland KitchenJohny Chess

## 2018-06-18 ENCOUNTER — Ambulatory Visit: Payer: BC Managed Care – PPO | Admitting: Internal Medicine

## 2018-07-15 ENCOUNTER — Other Ambulatory Visit: Payer: Self-pay

## 2018-07-15 MED ORDER — OMEPRAZOLE 40 MG PO CPDR: 40.0000 mg | DELAYED_RELEASE_CAPSULE | Freq: Every day | ORAL | 1 refills | Status: DC

## 2018-07-28 ENCOUNTER — Other Ambulatory Visit (INDEPENDENT_AMBULATORY_CARE_PROVIDER_SITE_OTHER): Payer: BC Managed Care – PPO

## 2018-07-28 ENCOUNTER — Ambulatory Visit (INDEPENDENT_AMBULATORY_CARE_PROVIDER_SITE_OTHER): Payer: BC Managed Care – PPO | Admitting: Internal Medicine

## 2018-07-28 ENCOUNTER — Encounter: Payer: Self-pay | Admitting: Internal Medicine

## 2018-07-28 DIAGNOSIS — E1165 Type 2 diabetes mellitus with hyperglycemia: Secondary | ICD-10-CM | POA: Diagnosis not present

## 2018-07-28 DIAGNOSIS — IMO0002 Reserved for concepts with insufficient information to code with codable children: Secondary | ICD-10-CM

## 2018-07-28 DIAGNOSIS — F4323 Adjustment disorder with mixed anxiety and depressed mood: Secondary | ICD-10-CM | POA: Diagnosis not present

## 2018-07-28 DIAGNOSIS — R000 Tachycardia, unspecified: Secondary | ICD-10-CM

## 2018-07-28 DIAGNOSIS — I509 Heart failure, unspecified: Secondary | ICD-10-CM | POA: Diagnosis not present

## 2018-07-28 DIAGNOSIS — E669 Obesity, unspecified: Secondary | ICD-10-CM

## 2018-07-28 DIAGNOSIS — G43009 Migraine without aura, not intractable, without status migrainosus: Secondary | ICD-10-CM

## 2018-07-28 LAB — BASIC METABOLIC PANEL
BUN: 9 mg/dL (ref 6–23)
CHLORIDE: 106 meq/L (ref 96–112)
CO2: 26 mEq/L (ref 19–32)
Calcium: 9 mg/dL (ref 8.4–10.5)
Creatinine, Ser: 0.64 mg/dL (ref 0.40–1.20)
GFR: 104.57 mL/min (ref >60.00)
Glucose, Bld: 163 mg/dL — ABNORMAL HIGH (ref 70–99)
POTASSIUM: 3.8 meq/L (ref 3.5–5.1)
Sodium: 139 mEq/L (ref 135–145)

## 2018-07-28 LAB — HEMOGLOBIN A1C: Hgb A1c MFr Bld: 8.6 % — ABNORMAL HIGH (ref 4.6–6.5)

## 2018-07-28 NOTE — Assessment & Plan Note (Signed)
BMET 

## 2018-07-28 NOTE — Patient Instructions (Signed)
RTC 4-5 mo Well exam

## 2018-07-28 NOTE — Assessment & Plan Note (Signed)
No relapse 

## 2018-07-28 NOTE — Progress Notes (Signed)
Subjective:  Patient ID: [name] [name], female    DOB: 28-Feb-1966  Age: 52 y.o. MRN: [id number]  CC: No chief complaint on file.   HPI KAMANI MAGNUSSEN presents for CHF, HTN, DM f/u   Outpatient Medications Prior to Visit  Medication Sig Dispense Refill  . ALPRAZolam (XANAX) 0.5 MG tablet TAKE ONE TABLET BY MOUTH TWICE DAILY 60 tablet 2  . aspirin 81 MG chewable tablet Chew 1 tablet (81 mg total) by mouth daily. 90 tablet 1  . atorvastatin (LIPITOR) 10 MG tablet Take 1 tablet (10 mg total) by mouth daily. 90 tablet 3  . buPROPion (WELLBUTRIN SR) 150 MG 12 hr tablet Take 1 tablet (150 mg total) by mouth 2 (two) times daily. 180 tablet 3  . Cholecalciferol (EQL VITAMIN D3) 1000 units tablet Take 1 tablet (1,000 Units total) by mouth daily. 90 tablet 1  . citalopram (CELEXA) 20 MG tablet Take 1 tablet (20 mg total) by mouth daily. 90 tablet 3  . empagliflozin (JARDIANCE) 25 MG TABS tablet Take 25 mg by mouth daily. 90 tablet 3  . fenofibrate (TRICOR) 145 MG tablet Take 1 tablet (145 mg total) by mouth daily. 90 tablet 1  . metFORMIN (GLUCOPHAGE) 500 MG tablet TAKE 2 TABLETS BY MOUTH TWICE DAILY 360 tablet 1  . omeprazole (PRILOSEC) 40 MG capsule Take 1 capsule (40 mg total) by mouth daily. 90 capsule 1  . repaglinide (PRANDIN) 2 MG tablet Take 1 tablet (2 mg total) by mouth 3 (three) times daily before meals. 270 tablet 2  . amoxicillin-clavulanate (AUGMENTIN) 875-125 MG tablet Take 1 tablet by mouth 2 (two) times daily. 20 tablet 0   No facility-administered medications prior to visit.     ROS: Review of Systems  Constitutional: Negative for activity change, appetite change, chills, fatigue and unexpected weight change.  HENT: Negative for congestion, mouth sores and sinus pressure.   Eyes: Negative for visual disturbance.  Respiratory: Negative for cough and chest tightness.   Gastrointestinal: Negative for abdominal pain and nausea.  Genitourinary: Negative for difficulty  urinating, frequency and vaginal pain.  Musculoskeletal: Negative for back pain and gait problem.  Skin: Negative for pallor and rash.  Neurological: Negative for dizziness, tremors, weakness, numbness and headaches.  Psychiatric/Behavioral: Negative for confusion and sleep disturbance.    Objective:  BP 128/76 (BP Location: Left Arm, Patient Position: Sitting, Cuff Size: Large)   Pulse 92   Temp 98.5 F (36.9 C) (Oral)   Ht 5\' 7"  (1.702 m)   Wt 230 lb (104.3 kg)   SpO2 95%   BMI 36.02 kg/m   BP Readings from Last 3 Encounters:  07/28/18 128/76  02/22/18 (!) 142/76  02/22/18 (!) 150/80    Wt Readings from Last 3 Encounters:  07/28/18 230 lb (104.3 kg)  02/22/18 234 lb (106.1 kg)  02/22/18 234 lb (106.1 kg)    Physical Exam  Constitutional: She appears well-developed. No distress.  HENT:  Head: Normocephalic.  Right Ear: External ear normal.  Left Ear: External ear normal.  Nose: Nose normal.  Mouth/Throat: Oropharynx is clear and moist.  Eyes: Pupils are equal, round, and reactive to light. Conjunctivae are normal. Right eye exhibits no discharge. Left eye exhibits no discharge.  Neck: Normal range of motion. Neck supple. No JVD present. No tracheal deviation present. No thyromegaly present.  Cardiovascular: Normal rate, regular rhythm and normal heart sounds.  Pulmonary/Chest: No stridor. No respiratory distress. She has no wheezes.  Abdominal: Soft. Bowel sounds  are normal. She exhibits no distension and no mass. There is no tenderness. There is no rebound and no guarding.  Musculoskeletal: She exhibits no edema or tenderness.  Lymphadenopathy:    She has no cervical adenopathy.  Neurological: She displays normal reflexes. No cranial nerve deficit. She exhibits normal muscle tone. Coordination normal.  Skin: No rash noted. No erythema.  Psychiatric: She has a normal mood and affect. Her behavior is normal. Judgment and thought content normal.  obese  Lab Results    Component Value Date   WBC 7.4 07/07/2017   HGB 12.7 07/07/2017   HCT 38.4 07/07/2017   PLT 211.0 07/07/2017   GLUCOSE 159 (H) 02/22/2018   CHOL 169 10/23/2017   TRIG 149.0 10/23/2017   HDL 41.80 10/23/2017   LDLDIRECT 57.0 03/26/2017   LDLCALC 97 10/23/2017   ALT 31 10/23/2017   AST 31 10/23/2017   NA 136 02/22/2018   K 3.7 02/22/2018   CL 102 02/22/2018   CREATININE 0.71 02/22/2018   BUN 16 02/22/2018   CO2 25 02/22/2018   TSH 1.94 07/07/2017   HGBA1C 8.1 (H) 02/22/2018    Mm Digital Screening  Result Date: 04/06/2012 DG SCREEN MAMMOGRAM BILATERAL Bilateral CC and MLO view(s) were taken. DIGITAL SCREENING MAMMOGRAM WITH CAD: The breast tissue is almost entirely fatty.  No masses or malignant type calcifications are identified. Images were processed with CAD. IMPRESSION: No specific mammographic evidence of malignancy.  Next screening mammogram is recommended in one year. A result letter of this screening mammogram will be mailed directly to the patient. ASSESSMENT: Negative - BI-RADS 1 Screening mammogram in 1 year. ,   Assessment & Plan:   There are no diagnoses linked to this encounter.   No orders of the defined types were placed in this encounter.    Follow-up: No follow-ups on file.  [name], MD

## 2018-07-28 NOTE — Assessment & Plan Note (Signed)
Xanax prn  Potential benefits of a long term benzodiazepines  use as well as potential risks  and complications were explained to the patient and were aknowledged. 

## 2018-07-28 NOTE — Assessment & Plan Note (Signed)
Metformin, Prandin Jardiance

## 2018-07-28 NOTE — Assessment & Plan Note (Signed)
HR 92 No sx's

## 2018-07-28 NOTE — Assessment & Plan Note (Signed)
Jardiance helps

## 2018-10-10 ENCOUNTER — Other Ambulatory Visit: Payer: Self-pay

## 2018-10-10 ENCOUNTER — Encounter (HOSPITAL_COMMUNITY): Payer: Self-pay | Admitting: *Deleted

## 2018-10-10 ENCOUNTER — Emergency Department (HOSPITAL_COMMUNITY)
Admission: EM | Admit: 2018-10-10 | Discharge: 2018-10-10 | Disposition: A | Discharge status: Home / Self Care | Payer: BC Managed Care – PPO | Attending: Emergency Medicine | Admitting: Emergency Medicine

## 2018-10-10 DIAGNOSIS — Y939 Activity, unspecified: Secondary | ICD-10-CM | POA: Diagnosis not present

## 2018-10-10 DIAGNOSIS — Y929 Unspecified place or not applicable: Secondary | ICD-10-CM | POA: Insufficient documentation

## 2018-10-10 DIAGNOSIS — Z7982 Long term (current) use of aspirin: Secondary | ICD-10-CM | POA: Diagnosis not present

## 2018-10-10 DIAGNOSIS — I509 Heart failure, unspecified: Secondary | ICD-10-CM | POA: Insufficient documentation

## 2018-10-10 DIAGNOSIS — Y999 Unspecified external cause status: Secondary | ICD-10-CM | POA: Diagnosis not present

## 2018-10-10 DIAGNOSIS — M546 Pain in thoracic spine: Secondary | ICD-10-CM | POA: Insufficient documentation

## 2018-10-10 DIAGNOSIS — I110 Hypertensive heart disease with heart failure: Secondary | ICD-10-CM | POA: Diagnosis not present

## 2018-10-10 DIAGNOSIS — S46812A Strain of other muscles, fascia and tendons at shoulder and upper arm level, left arm, initial encounter: Secondary | ICD-10-CM | POA: Diagnosis not present

## 2018-10-10 DIAGNOSIS — Z7984 Long term (current) use of oral hypoglycemic drugs: Secondary | ICD-10-CM | POA: Insufficient documentation

## 2018-10-10 DIAGNOSIS — X58XXXA Exposure to other specified factors, initial encounter: Secondary | ICD-10-CM | POA: Diagnosis not present

## 2018-10-10 DIAGNOSIS — Z720 Tobacco use: Secondary | ICD-10-CM | POA: Insufficient documentation

## 2018-10-10 DIAGNOSIS — E119 Type 2 diabetes mellitus without complications: Secondary | ICD-10-CM | POA: Insufficient documentation

## 2018-10-10 DIAGNOSIS — Z79899 Other long term (current) drug therapy: Secondary | ICD-10-CM | POA: Insufficient documentation

## 2018-10-10 MED ORDER — MORPHINE SULFATE (PF) 4 MG/ML IV SOLN
4.0000 mg | Freq: Once | INTRAVENOUS | Status: AC
  Administered 2018-10-10: 4 mg via INTRAVENOUS
  Filled 2018-10-10: qty 1

## 2018-10-10 MED ORDER — METHOCARBAMOL 500 MG PO TABS: 500.0000 mg | ORAL_TABLET | Freq: Two times a day (BID) | ORAL | 0 refills | Status: DC | PRN

## 2018-10-10 MED ORDER — METHOCARBAMOL 500 MG PO TABS
750.0000 mg | ORAL_TABLET | Freq: Once | ORAL | Status: AC
  Administered 2018-10-10: 750 mg via ORAL
  Filled 2018-10-10: qty 2

## 2018-10-10 MED ORDER — KETOROLAC TROMETHAMINE 15 MG/ML IJ SOLN
15.0000 mg | Freq: Once | INTRAMUSCULAR | Status: AC
  Administered 2018-10-10: 15 mg via INTRAVENOUS
  Filled 2018-10-10: qty 1

## 2018-10-10 NOTE — ED Provider Notes (Signed)
[facility] DEPT Provider Note   CSN: [id number] Arrival date & time: 10/10/18  0515     History   Chief Complaint Chief Complaint  Patient presents with  . Back Pain    HPI [name] [name] is a 52 y.o. female presenting for evaluation of left upper back pain.  Patient states for the past 5 days, she has been having persistent left upper back pain.  It is constant, worse with movement.  Pain initially began as a pulling/spasming pain.  Patient has tried ibuprofen, tramadol, Flexeril, and muscle patches without improvement of symptoms.  She denies fall, trauma, or injury.  She denies symptoms on the right side.  She denies fevers, chills, chest pain, shortness of breath, cough, nausea, vomiting, abdominal pain.  She denies numbness or tingling of the left arm.  She denies recent travel, surgeries, immobilization, history of cancer, history of previous DVT/PE, or hormone use.  Patient has a history of left rotator cuff tear, but states this pain is different. Pt's orthopedic doctor is Dr. Tamala Julian.   HPI  Past Medical History:  Diagnosis Date  . Anxiety   . CHF (congestive heart failure) ([place]) 2005   peripartum cardiomyopathy  . Depression   . Diabetes mellitus    type II  . 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 Hydradenitis    right    Patient Active Problem List   Diagnosis Date Noted  . Rotator cuff tear, non-traumatic, left 12/28/2017  . RTI (respiratory tract infection) 11/26/2017  . Shoulder pain, left 10/21/2017  . Obesity (BMI 35.0-39.9 without comorbidity) 07/07/2017  . Fatigue 07/07/2017  . Hydradenitis 03/20/2016  . Eczema 03/20/2016  . Anemia 05/30/2015  . Family history of colon cancer 02/28/2014  . Right leg pain 02/28/2014  . Right elbow pain 02/28/2014  . Sinusitis, acute 11/09/2013  . Asthmatic bronchitis 11/09/2013  . Well adult exam 05/26/2012  . Microcytic anemia 05/26/2012  . Bronchitis, acute 10/02/2011  . Migraine 09/23/2011    . Tachycardia 01/21/2011  . DYSPNEA 01/12/2009  . TOBACCO USE DISORDER/SMOKER-SMOKING CESSATION DISCUSSED 04/13/2008  . Diabetes type 2, controlled ([place]) 01/11/2008  . Anxiety state 01/11/2008  . Adjustment disorder with mixed anxiety and depressed mood 01/11/2008  . POLYCYSTIC OVARIAN DISEASE 09/24/2007  . Congestive heart failure ([place]) 09/24/2007  . LIVER FUNCTION TESTS, ABNORMAL 09/24/2007    Past Surgical History:  Procedure Laterality Date  . CHOLECYSTECTOMY  2006     OB History   None      Home Medications    Prior to Admission medications   Medication Sig Start Date End Date Taking? Authorizing Provider  ALPRAZolam Duanne Moron) 0.5 MG tablet TAKE ONE TABLET BY MOUTH TWICE DAILY 11/06/17   [name], [name], MD  aspirin 81 MG chewable tablet Chew 1 tablet (81 mg total) by mouth daily. 05/19/17   [name], [name], MD  atorvastatin (LIPITOR) 10 MG tablet Take 1 tablet (10 mg total) by mouth daily. 02/22/18   [name], [name], MD  buPROPion (WELLBUTRIN SR) 150 MG 12 hr tablet Take 1 tablet (150 mg total) by mouth 2 (two) times daily. 02/22/18   [name], [name], MD  Cholecalciferol (EQL VITAMIN D3) 1000 units tablet Take 1 tablet (1,000 Units total) by mouth daily. 05/19/17   [name], [name], MD  citalopram (CELEXA) 20 MG tablet Take 1 tablet (20 mg total) by mouth daily. 02/22/18   [name], [name], MD  empagliflozin (JARDIANCE) 25 MG TABS tablet Take 25  mg by mouth daily. 03/04/18   [name], [name], MD  fenofibrate (TRICOR) 145 MG tablet Take 1 tablet (145 mg total) by mouth daily. 05/12/18   [name], [name], MD  metFORMIN (GLUCOPHAGE) 500 MG tablet TAKE 2 TABLETS BY MOUTH TWICE DAILY 05/19/18   [name], [name], MD  methocarbamol (ROBAXIN) 500 MG tablet Take 1 tablet (500 mg total) by mouth 2 (two) times daily as needed for muscle spasms. 10/10/18   Shaleka Brines, PA-C  omeprazole (PRILOSEC) 40 MG capsule Take 1 capsule (40 mg total) by mouth  daily. 07/15/18   [name], [name], MD  repaglinide (PRANDIN) 2 MG tablet Take 1 tablet (2 mg total) by mouth 3 (three) times daily before meals. 06/01/18   [name], [name], MD    Family History Family History  Problem Relation Age of Onset  . Diabetes Mother   . Hypertension Mother   . Hypertension Other     Social History Social History   Tobacco Use  . Smoking status: Light Tobacco Smoker    Packs/day: 0.50    Years: 15.00    Pack years: 7.50  . Smokeless tobacco: Never Used  Substance Use Topics  . Alcohol use: No  . Drug use: No     Allergies   Patient has no known allergies.   Review of Systems Review of Systems  Musculoskeletal: Positive for back pain.  All other systems reviewed and are negative.    Physical Exam Updated Vital Signs BP 136/90 (BP Location: Right Arm)   Pulse 97   Temp 98 F (36.7 C) (Oral)   Resp 17   SpO2 99%   Physical Exam  Constitutional: She is oriented to person, place, and time. She appears well-developed and well-nourished. No distress.  Obese female who appears uncomfortable due to pain in no acute distress  HENT:  Head: Normocephalic and atraumatic.  Eyes: Pupils are equal, round, and reactive to light. Conjunctivae and EOM are normal.  Neck: Normal range of motion. Neck supple.  Cardiovascular: Normal rate, regular rhythm and intact distal pulses.  Pulmonary/Chest: Effort normal and breath sounds normal. No respiratory distress. She has no wheezes.  Clear lung sounds in all fields.  Speaking in full sentences.  Abdominal: Soft. She exhibits no distension and no mass. There is no tenderness. There is no guarding.  Musculoskeletal: Normal range of motion.  Tenderness palpation of the left trapezius muscle.  No test palpation over midline spine or along the right side.  Pain extends into the left neck musculature.  Patient is moving head easily during exam.  Moving arms easily during exam.  Radial pulses intact  bilaterally.  Grip strength intact bilaterally.  Full active range of motion of upper extremities.  Soft compartments.  Neurological: She is alert and oriented to person, place, and time.  Skin: Skin is warm and dry. Capillary refill takes less than 2 seconds.  Psychiatric: She has a normal mood and affect.  Nursing note and vitals reviewed.    ED Treatments / Results  Labs (all labs ordered are listed, but only abnormal results are displayed) Labs Reviewed - No data to display  EKG None  Radiology No results found.  Procedures Procedures (including critical care time)  Medications Ordered in ED Medications  ketorolac (TORADOL) 15 MG/ML injection 15 mg (15 mg Intravenous Given 10/10/18 0611)  morphine 4 MG/ML injection 4 mg (4 mg Intravenous Given 10/10/18 0611)  methocarbamol (ROBAXIN) tablet 750 mg (750 mg Oral Given 10/10/18 0607)  Initial Impression / Assessment and Plan / ED Course  I have reviewed the triage vital signs and the nursing notes.  Pertinent labs & imaging results that were available during my care of the patient were reviewed by me and considered in my medical decision making (see chart for details).     Pt presenting for evaluation of back pain.  Physical exam reassuring, she is neurovascularly intact.  No pain over midline spine.  No red flags of back pain.  Pain is reproducible with palpation of the musculature. Pulmonary exam reassuring. Doubt PNA or PE. Offered cxr for further evaluation, pt declined. Will try medication for sx control, and plan for d/c with f/u with ortho.    On reevaluation, patient reports pain is improving with medications.  She feels comfortable going home and following up with orthopedics.  Will send home on Robaxin.  Discussed continued symptomatic treatment with Tylenol, ibuprofen, and her home tramadol.  At this time, patient appears safe for discharge.  Return percussions given.  Patient states she understands and agrees to  plan.    Final Clinical Impressions(s) / ED Diagnoses   Final diagnoses:  Acute left-sided thoracic back pain  Strain of left trapezius muscle, initial encounter    ED Discharge Orders         Ordered    methocarbamol (ROBAXIN) 500 MG tablet  2 times daily PRN     10/10/18 0653           Franchot Heidelberg, PA-C 33/43/56 [id number]    Delora Fuel, MD 83/72/90 [id number]

## 2018-10-10 NOTE — Discharge Instructions (Signed)
Take ibuprofen 3 times a day with meals.  Take 800 mg at a time. Do not take other anti-inflammatories at the same time open (Advil, Motrin, ibuprofen, Aleve). You may supplement with Tylenol if you need further pain control. You may continue taking your home tramadol as needed for severe or breakthrough pain.  Use Robaxin as needed for muscle stiffness or soreness. Have caution, as this may make you tired or groggy. Do not drive or operate heavy machinery while taking this medication.  Use heat to help relax the muscles. Follow up with your orthopedic doctor if pain is not improving with this treatment.  Return to the ER if you develop high fevers, numbness, difficulty breathing, or any new or concerning symptoms.

## 2018-10-10 NOTE — ED Triage Notes (Signed)
Pt reports "throbbing, spasm" pain that started in her mid back just left of the spine. She says the pain has moved up in her neck that makes it difficult and painful to turn her neck. Pain started to radiate into the left arm yesterday. Denies injury. Took tramadol last night without relief.

## 2018-10-26 NOTE — Progress Notes (Signed)
[name] [address] [place], [place] [address] Phone: [phone] Subjective:   Fontaine No, am serving as a scribe for Dr. Hulan Saas.  I'm seeing this patient by the request  of:    CC: Left neck and shoulder pain  EQA:[id number]  [name] [name] is a 52 y.o. female coming in with complaint of left scapula pain for 3 weeks. Pain radiates into neck and into her hand: 2nd and 3rd finger. Was in so much pain that she had to go to ER. Has been using Tramadol, IBU, and mm relaxers for her pain. Pain when lying down at night. Pain is sharp. Does have relief with putting her hand on top of her head.        Past Medical History:  Diagnosis Date  . Anxiety   . CHF (congestive heart failure) ([place]) 2005   peripartum cardiomyopathy  . Depression   . Diabetes mellitus    type II  . 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 Hydradenitis    right   Past Surgical History:  Procedure Laterality Date  . CHOLECYSTECTOMY  2006   Social History   Socioeconomic History  . Marital status: Married    Spouse name: Not on file  . Number of children: Not on file  . Years of education: Not on file  . Highest education level: Not on file  Occupational History  . Not on file  Social Needs  . Financial resource strain: Not on file  . Food insecurity:    Worry: Not on file    Inability: Not on file  . Transportation needs:    Medical: Not on file    Non-medical: Not on file  Tobacco Use  . Smoking status: Light Tobacco Smoker    Packs/day: 0.50    Years: 15.00    Pack years: 7.50  . Smokeless tobacco: Never Used  Substance and Sexual Activity  . Alcohol use: No  . Drug use: No  . Sexual activity: Yes  Lifestyle  . Physical activity:    Days per week: Not on file    Minutes per session: Not on file  . Stress: Not on file  Relationships  . Social connections:    Talks on phone: Not on file    Gets together: Not on file    Attends religious service: Not  on file    Active member of club or organization: Not on file    Attends meetings of clubs or organizations: Not on file    Relationship status: Not on file  Other Topics Concern  . Not on file  Social History Narrative  . Not on file   No Known Allergies Family History  Problem Relation Age of Onset  . Diabetes Mother   . Hypertension Mother   . Hypertension Other     Current Outpatient Medications (Endocrine & Metabolic):  .  empagliflozin (JARDIANCE) 25 MG TABS tablet, Take 25 mg by mouth daily. .  metFORMIN (GLUCOPHAGE) 500 MG tablet, TAKE 2 TABLETS BY MOUTH TWICE DAILY .  repaglinide (PRANDIN) 2 MG tablet, Take 1 tablet (2 mg total) by mouth 3 (three) times daily before meals.  Current Outpatient Medications (Cardiovascular):  .  atorvastatin (LIPITOR) 10 MG tablet, Take 1 tablet (10 mg total) by mouth daily. .  fenofibrate (TRICOR) 145 MG tablet, Take 1 tablet (145 mg total) by mouth daily.   Current Outpatient Medications (Analgesics):  .  aspirin 81 MG  chewable tablet, Chew 1 tablet (81 mg total) by mouth daily.   Current Outpatient Medications (Other):  [name]  ALPRAZolam (XANAX) 0.5 MG tablet, TAKE ONE TABLET BY MOUTH TWICE DAILY .  buPROPion (WELLBUTRIN SR) 150 MG 12 hr tablet, Take 1 tablet (150 mg total) by mouth 2 (two) times daily. .  Cholecalciferol (EQL VITAMIN D3) 1000 units tablet, Take 1 tablet (1,000 Units total) by mouth daily. .  citalopram (CELEXA) 20 MG tablet, Take 1 tablet (20 mg total) by mouth daily. .  methocarbamol (ROBAXIN) 500 MG tablet, Take 1 tablet (500 mg total) by mouth 2 (two) times daily as needed for muscle spasms. [name]  omeprazole (PRILOSEC) 40 MG capsule, Take 1 capsule (40 mg total) by mouth daily. [name]  gabapentin (NEURONTIN) 100 MG capsule, Take 2 capsules (200 mg total) by mouth at bedtime. [name]  tiZANidine (ZANAFLEX) 2 MG tablet, Take 1 tablet (2 mg total) by mouth every 8 (eight) hours as needed for muscle spasms.    Past medical history,  social, surgical and family history all reviewed in electronic medical record.  No pertanent information unless stated regarding to the chief complaint.   Review of Systems:  No headache, visual changes, nausea, vomiting, diarrhea, constipation, dizziness, abdominal pain, skin rash, fevers, chills, night sweats, weight loss, swollen lymph nodes, body aches, joint swelling, chest pain, shortness of breath, mood changes.  Positive muscle aches  Objective  Blood pressure (!) 144/90, pulse (!) 111, height 5\' 7"  (1.702 m), weight 224 lb (101.6 kg), SpO2 98 %.    General: No apparent distress alert and oriented x3 mood and affect normal, dressed appropriately.  HEENT: Pupils equal, extraocular movements intact  Respiratory: Patient's speak in full sentences and does not appear short of breath  Cardiovascular: No lower extremity edema, non tender, no erythema  Skin: Warm dry intact with no signs of infection or rash on extremities or on axial skeleton.  Abdomen: Soft nontender  Neuro: Cranial nerves II through XII are intact, neurovascularly intact in all extremities with 2+ DTRs and 2+ pulses.  Lymph: No lymphadenopathy of posterior or anterior cervical chain or axillae bilaterally.  Gait normal with good balance and coordination.  MSK:  Non tender with full range of motion and good stability and symmetric strength and tone of shoulders, elbows, wrist, hip, knee and ankles bilaterally.  Neck: Inspection loss of lordosis. No palpable stepoffs. Positive Spurling's maneuver. Decreased range of motion in all planes especially with sidebending and rotation. Mild weakness with left-sided C7 distribution compared to the contralateral side Negative Hoffman sign bilaterally Reflexes normal   Impression and Recommendations:     This case required medical decision making of moderate complexity. The above documentation has been reviewed and is accurate and complete Lyndal Pulley, DO       Note:  This dictation was prepared with Dragon dictation along with smaller phrase technology. Any transcriptional errors that result from this process are unintentional.

## 2018-10-27 ENCOUNTER — Encounter: Payer: Self-pay | Admitting: Family Medicine

## 2018-10-27 ENCOUNTER — Ambulatory Visit (INDEPENDENT_AMBULATORY_CARE_PROVIDER_SITE_OTHER): Payer: BC Managed Care – PPO | Admitting: Family Medicine

## 2018-10-27 ENCOUNTER — Ambulatory Visit (INDEPENDENT_AMBULATORY_CARE_PROVIDER_SITE_OTHER)
Admission: RE | Admit: 2018-10-27 | Discharge: 2018-10-27 | Disposition: A | Discharge status: Home / Self Care | Payer: BC Managed Care – PPO | Source: Ambulatory Visit | Attending: Family Medicine | Admitting: Family Medicine

## 2018-10-27 VITALS — BP 144/90 | HR 111 | Ht 67.0 in | Wt 224.0 lb

## 2018-10-27 DIAGNOSIS — M5412 Radiculopathy, cervical region: Secondary | ICD-10-CM

## 2018-10-27 MED ORDER — METHYLPREDNISOLONE ACETATE 80 MG/ML IJ SUSP
80.0000 mg | Freq: Once | INTRAMUSCULAR | Status: AC
  Administered 2018-10-27: 80 mg via INTRAMUSCULAR

## 2018-10-27 MED ORDER — TIZANIDINE HCL 2 MG PO TABS: 2.0000 mg | ORAL_TABLET | Freq: Three times a day (TID) | ORAL | 1 refills | Status: AC | PRN

## 2018-10-27 MED ORDER — KETOROLAC TROMETHAMINE 60 MG/2ML IM SOLN
60.0000 mg | Freq: Once | INTRAMUSCULAR | Status: AC
  Administered 2018-10-27: 60 mg via INTRAMUSCULAR

## 2018-10-27 MED ORDER — GABAPENTIN 100 MG PO CAPS: 200.0000 mg | ORAL_CAPSULE | Freq: Every day | ORAL | 3 refills | Status: DC

## 2018-10-27 NOTE — Assessment & Plan Note (Signed)
Mild weakness on the left side.  Positive Spurling's.  Patient's deep tendon reflexes though are intact.  Started gabapentin, muscle relaxers given, discussed icing regimen.  We discussed the possibility of prednisone the patient has actually had some difficulty with her blood sugars and her diabetes recently.  Toradol injection and Depo-Medrol given low today and warned to still monitor blood sugars.  Follow-up again in 1 to 2 weeks for further evaluation.  X-rays pending

## 2018-10-27 NOTE — Patient Instructions (Addendum)
Good to see you  Ice is your friend  Xray downstairs Exercises 3 times a week.  Gabapentin 200mg  at night  zanaflex up to 3 times daily buit try it at home first to see how you react to it.  Se eme gaain in 1-2 weeks (ok to double book)

## 2018-11-08 ENCOUNTER — Ambulatory Visit: Payer: BC Managed Care – PPO | Admitting: Family Medicine

## 2018-11-17 ENCOUNTER — Other Ambulatory Visit: Payer: Self-pay

## 2018-11-17 MED ORDER — FENOFIBRATE 145 MG PO TABS: 145.0000 mg | ORAL_TABLET | Freq: Every day | ORAL | 1 refills | Status: DC

## 2018-11-17 MED ORDER — METFORMIN HCL 500 MG PO TABS: ORAL_TABLET | ORAL | 1 refills | Status: DC

## 2018-11-23 NOTE — Progress Notes (Signed)
[name] [address] [place],  [address] Phone: [phone] Subjective:    I [name] [name] am serving as a Education administrator for Dr. Hulan Saas.  CC: Neck pain follow-up  [id number]  [name] [name] is a 52 y.o. female coming in with complaint of neck pain. States the neck is better than it was.  Patient is making progress overall.  Patient states that she is approximately 50 to 60% better.  Pain is still there but is bearable at this moment      Past Medical History:  Diagnosis Date  . Anxiety   . 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 Depression   . Diabetes mellitus    type II  . 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 Hydradenitis    right   Past Surgical History:  Procedure Laterality Date  . CHOLECYSTECTOMY  2006   Social History   Socioeconomic History  . Marital status: Married    Spouse name: Not on file  . Number of children: Not on file  . Years of education: Not on file  . Highest education level: Not on file  Occupational History  . Not on file  Social Needs  . Financial resource strain: Not on file  . Food insecurity:    Worry: Not on file    Inability: Not on file  . Transportation needs:    Medical: Not on file    Non-medical: Not on file  Tobacco Use  . Smoking status: Light Tobacco Smoker    Packs/day: 0.50    Years: 15.00    Pack years: 7.50  . Smokeless tobacco: Never Used  Substance and Sexual Activity  . Alcohol use: No  . Drug use: No  . Sexual activity: Yes  Lifestyle  . Physical activity:    Days per week: Not on file    Minutes per session: Not on file  . Stress: Not on file  Relationships  . Social connections:    Talks on phone: Not on file    Gets together: Not on file    Attends religious service: Not on file    Active member of club or organization: Not on file    Attends meetings of clubs or organizations: Not on file    Relationship status: Not on file    Other Topics Concern  . Not on file  Social History Narrative  . Not on file   No Known Allergies Family History  Problem Relation Age of Onset  . Diabetes Mother   . Hypertension Mother   . Hypertension Other     Current Outpatient Medications (Endocrine & Metabolic):  .  empagliflozin (JARDIANCE) 25 MG TABS tablet, Take 25 mg by mouth daily. .  metFORMIN (GLUCOPHAGE) 500 MG tablet, TAKE 2 TABLETS BY MOUTH TWICE DAILY .  repaglinide (PRANDIN) 2 MG tablet, Take 1 tablet (2 mg total) by mouth 3 (three) times daily before meals.  Current Outpatient Medications (Cardiovascular):  .  atorvastatin (LIPITOR) 10 MG tablet, Take 1 tablet (10 mg total) by mouth daily. .  fenofibrate (TRICOR) 145 MG tablet, Take 1 tablet (145 mg total) by mouth daily.   Current Outpatient Medications (Analgesics):  .  aspirin 81 MG chewable tablet, Chew 1 tablet (81 mg total) by mouth daily.   Current Outpatient Medications (Other):  [name]  ALPRAZolam (XANAX) 0.5 MG tablet, TAKE ONE TABLET BY MOUTH TWICE DAILY .  buPROPion [facility] SR) 150  MG 12 hr tablet, Take 1 tablet (150 mg total) by mouth 2 (two) times daily. .  Cholecalciferol (EQL VITAMIN D3) 1000 units tablet, Take 1 tablet (1,000 Units total) by mouth daily. .  citalopram (CELEXA) 20 MG tablet, Take 1 tablet (20 mg total) by mouth daily. [name]  gabapentin (NEURONTIN) 100 MG capsule, Take 2 capsules (200 mg total) by mouth at bedtime. .  methocarbamol (ROBAXIN) 500 MG tablet, Take 1 tablet (500 mg total) by mouth 2 (two) times daily as needed for muscle spasms. [name]  omeprazole (PRILOSEC) 40 MG capsule, Take 1 capsule (40 mg total) by mouth daily. [name]  tiZANidine (ZANAFLEX) 2 MG tablet, Take 1 tablet (2 mg total) by mouth every 8 (eight) hours as needed for muscle spasms.    Past medical history, social, surgical and family history all reviewed in electronic medical record.  No pertanent information unless stated regarding to the chief complaint.    Review of Systems:  No headache, visual changes, nausea, vomiting, diarrhea, constipation, dizziness, abdominal pain, skin rash, fevers, chills, night sweats, weight loss, swollen lymph nodes, body aches, joint swelling,  chest pain, shortness of breath, mood changes.  Positive muscle aches  Objective  Blood pressure (!) 160/80, pulse 90, height 5\' 7"  (1.702 m), weight 226 lb (102.5 kg), SpO2 98 %.   General: No apparent distress alert and oriented x3 mood and affect normal, dressed appropriately.  HEENT: Pupils equal, extraocular movements intact  Respiratory: Patient's speak in full sentences and does not appear short of breath  Cardiovascular: No lower extremity edema, non tender, no erythema  Skin: Warm dry intact with no signs of infection or rash on extremities or on axial skeleton.  Abdomen: Soft nontender  Neuro: Cranial nerves II through XII are intact, neurovascularly intact in all extremities with 2+ DTRs and 2+ pulses.  Lymph: No lymphadenopathy of posterior or anterior cervical chain or axillae bilaterally.  Gait normal with good balance and coordination.  MSK:  Non tender with full range of motion and good stability and symmetric strength and tone of shoulders, elbows, wrist, hip, knee and ankles bilaterally.  Neck: Inspection mild loss of lordosis. No palpable stepoffs. Mild positive Spurling's maneuver.  Nuclear symptoms noted going down the left arm Mild limited range of motion in all planes Grip strength and sensation normal in bilateral hands Strength good C4 to T1 distribution No sensory change to C4 to T1 Negative Hoffman sign bilaterally Reflexes normal Tightness of the left trapezius    Impression and Recommendations:      The above documentation has been reviewed and is accurate and complete Lyndal Pulley, DO       Note: This dictation was prepared with Dragon dictation along with smaller phrase technology. Any transcriptional errors that result  from this process are unintentional.

## 2018-11-24 ENCOUNTER — Encounter: Payer: Self-pay | Admitting: Family Medicine

## 2018-11-24 ENCOUNTER — Ambulatory Visit (INDEPENDENT_AMBULATORY_CARE_PROVIDER_SITE_OTHER): Payer: BC Managed Care – PPO | Admitting: Family Medicine

## 2018-11-24 DIAGNOSIS — M5412 Radiculopathy, cervical region: Secondary | ICD-10-CM

## 2018-11-24 NOTE — Assessment & Plan Note (Signed)
Patient does have more cervical radiculopathy.  Patient does have some radiation of the pain and symptoms been going down the left arm.  Continue the gabapentin.  Patient will check with physical therapy.  Follow-up with me again in 4 to 6 weeks.

## 2018-11-24 NOTE — Patient Instructions (Signed)
Good to see you  Ice is your friend Stay active Check with insurance and if possible PT would be good Continue the home exercises Keep hands within peripheral vision  See me again in 4-6 weeks Happy holidays!

## 2018-12-16 ENCOUNTER — Ambulatory Visit: Payer: BC Managed Care – PPO | Admitting: Internal Medicine

## 2019-01-24 ENCOUNTER — Other Ambulatory Visit: Payer: Self-pay

## 2019-01-24 ENCOUNTER — Encounter: Payer: Self-pay | Admitting: Internal Medicine

## 2019-01-24 ENCOUNTER — Ambulatory Visit (INDEPENDENT_AMBULATORY_CARE_PROVIDER_SITE_OTHER): Payer: BC Managed Care – PPO | Admitting: Internal Medicine

## 2019-01-24 VITALS — BP 140/68 | HR 107 | Temp 98.1°F | Ht 67.0 in | Wt 223.0 lb

## 2019-01-24 DIAGNOSIS — E1165 Type 2 diabetes mellitus with hyperglycemia: Secondary | ICD-10-CM | POA: Diagnosis not present

## 2019-01-24 DIAGNOSIS — E669 Obesity, unspecified: Secondary | ICD-10-CM | POA: Diagnosis not present

## 2019-01-24 DIAGNOSIS — R6889 Other general symptoms and signs: Secondary | ICD-10-CM | POA: Diagnosis not present

## 2019-01-24 DIAGNOSIS — J209 Acute bronchitis, unspecified: Secondary | ICD-10-CM | POA: Diagnosis not present

## 2019-01-24 LAB — POC INFLUENZA A&B (BINAX/QUICKVUE)
INFLUENZA B, POC: NEGATIVE
Influenza A, POC: NEGATIVE

## 2019-01-24 MED ORDER — TRIAMCINOLONE ACETONIDE 0.1 % EX OINT: 1.0000 "application " | TOPICAL_OINTMENT | Freq: Three times a day (TID) | CUTANEOUS | 1 refills | Status: DC

## 2019-01-24 MED ORDER — AZITHROMYCIN 250 MG PO TABS: ORAL_TABLET | ORAL | 0 refills | Status: DC

## 2019-01-24 NOTE — Assessment & Plan Note (Signed)
Zpack if worse 

## 2019-01-24 NOTE — Progress Notes (Signed)
Subjective:  Patient ID: [name] [name], female    DOB: [birth year]  Age: 53 y.o. MRN: [id number]  CC: No chief complaint on file.   HPI [name] [name] presents for URI sx's since Sat - ST, achy, cough F/u DM  Outpatient Medications Prior to Visit  Medication Sig Dispense Refill  . ALPRAZolam (XANAX) 0.5 MG tablet TAKE ONE TABLET BY MOUTH TWICE DAILY 60 tablet 2  . aspirin 81 MG chewable tablet Chew 1 tablet (81 mg total) by mouth daily. 90 tablet 1  . atorvastatin (LIPITOR) 10 MG tablet Take 1 tablet (10 mg total) by mouth daily. 90 tablet 3  . buPROPion (WELLBUTRIN SR) 150 MG 12 hr tablet Take 1 tablet (150 mg total) by mouth 2 (two) times daily. 180 tablet 3  . Cholecalciferol (EQL VITAMIN D3) 1000 units tablet Take 1 tablet (1,000 Units total) by mouth daily. 90 tablet 1  . citalopram (CELEXA) 20 MG tablet Take 1 tablet (20 mg total) by mouth daily. 90 tablet 3  . empagliflozin (JARDIANCE) 25 MG TABS tablet Take 25 mg by mouth daily. 90 tablet 3  . fenofibrate (TRICOR) 145 MG tablet Take 1 tablet (145 mg total) by mouth daily. 90 tablet 1  . metFORMIN (GLUCOPHAGE) 500 MG tablet TAKE 2 TABLETS BY MOUTH TWICE DAILY 360 tablet 1  . omeprazole (PRILOSEC) 40 MG capsule Take 1 capsule (40 mg total) by mouth daily. 90 capsule 1  . repaglinide (PRANDIN) 2 MG tablet Take 1 tablet (2 mg total) by mouth 3 (three) times daily before meals. 270 tablet 2  . gabapentin (NEURONTIN) 100 MG capsule Take 2 capsules (200 mg total) by mouth at bedtime. 60 capsule 3  . methocarbamol (ROBAXIN) 500 MG tablet Take 1 tablet (500 mg total) by mouth 2 (two) times daily as needed for muscle spasms. 15 tablet 0   No facility-administered medications prior to visit.     ROS: Review of Systems  Constitutional: Negative for activity change, appetite change, chills, fatigue and unexpected weight change.  HENT: Positive for sore throat. Negative for congestion, mouth sores and sinus pressure.   Eyes: Negative  for visual disturbance.  Respiratory: Positive for cough. Negative for chest tightness.   Gastrointestinal: Negative for abdominal pain and nausea.  Genitourinary: Negative for difficulty urinating, frequency and vaginal pain.  Musculoskeletal: Negative for back pain and gait problem.  Skin: Negative for pallor and rash.  Neurological: Negative for dizziness, tremors, weakness, numbness and headaches.  Psychiatric/Behavioral: Negative for confusion and sleep disturbance.    Objective:  BP 140/68 (BP Location: Left Arm, Patient Position: Sitting, Cuff Size: Large)   Pulse (!) 107   Temp 98.1 F (36.7 C) (Oral)   Ht 5\' 7"  (1.702 m)   Wt 223 lb (101.2 kg)   SpO2 97%   BMI 34.93 kg/m   BP Readings from Last 3 Encounters:  01/24/19 140/68  11/24/18 (!) 160/80  10/27/18 (!) 144/90    Wt Readings from Last 3 Encounters:  01/24/19 223 lb (101.2 kg)  11/24/18 226 lb (102.5 kg)  10/27/18 224 lb (101.6 kg)    Physical Exam Constitutional:      General: She is not in acute distress.    Appearance: She is well-developed.  HENT:     Head: Normocephalic.     Right Ear: External ear normal.     Left Ear: External ear normal.     Nose: Nose normal.     Mouth/Throat:     Pharynx:  Posterior oropharyngeal erythema present.  Eyes:     General:        Right eye: No discharge.        Left eye: No discharge.     Conjunctiva/sclera: Conjunctivae normal.     Pupils: Pupils are equal, round, and reactive to light.  Neck:     Musculoskeletal: Normal range of motion and neck supple.     Thyroid: No thyromegaly.     Vascular: No JVD.     Trachea: No tracheal deviation.  Cardiovascular:     Rate and Rhythm: Normal rate and regular rhythm.     Heart sounds: Normal heart sounds.  Pulmonary:     Effort: No respiratory distress.     Breath sounds: No stridor. No wheezing.  Abdominal:     General: Bowel sounds are normal. There is no distension.     Palpations: Abdomen is soft. There is no  mass.     Tenderness: There is no abdominal tenderness. There is no guarding or rebound.  Musculoskeletal:        General: No tenderness.  Lymphadenopathy:     Cervical: No cervical adenopathy.  Skin:    Findings: No erythema or rash.  Neurological:     Cranial Nerves: No cranial nerve deficit.     Motor: No abnormal muscle tone.     Coordination: Coordination normal.     Deep Tendon Reflexes: Reflexes normal.  Psychiatric:        Behavior: Behavior normal.        Thought Content: Thought content normal.        Judgment: Judgment normal.   eryth throat  Lab Results  Component Value Date   WBC 7.4 07/07/2017   HGB 12.7 07/07/2017   HCT 38.4 07/07/2017   PLT 211.0 07/07/2017   GLUCOSE 163 (H) 07/28/2018   CHOL 169 10/23/2017   TRIG 149.0 10/23/2017   HDL 41.80 10/23/2017   LDLDIRECT 57.0 03/26/2017   LDLCALC 97 10/23/2017   ALT 31 10/23/2017   AST 31 10/23/2017   NA 139 07/28/2018   K 3.8 07/28/2018   CL 106 07/28/2018   CREATININE 0.64 07/28/2018   BUN 9 07/28/2018   CO2 26 07/28/2018   TSH 1.94 07/07/2017   HGBA1C 8.6 (H) 07/28/2018    Dg Cervical Spine 2 Or 3 Views  Result Date: 10/28/2018 CLINICAL DATA:  Left neck and shoulder pain for 3 weeks EXAM: CERVICAL SPINE - 2-3 VIEW COMPARISON:  None. FINDINGS: Bulky ventral endplate spurring from K5-L9 without disc space loss, compatible with diffuse idiopathic skeletal hyperostosis. This is bridging at C2-3 at least. There is mass effect on the posterior pharynx. No evidence of fracture or bone lesion. IMPRESSION: No acute finding. 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 Electronically Signed   By: Monte Fantasia M.D.   On: 10/28/2018 09:09    Assessment & Plan:   There are no diagnoses linked to this encounter.   No orders of the defined types were placed in this encounter.    Follow-up: No follow-ups on file.  [name], MD

## 2019-01-24 NOTE — Addendum Note (Signed)
Addended by: Karren Cobble on: 01/24/2019 02:53 PM   Modules accepted: Orders

## 2019-01-24 NOTE — Patient Instructions (Signed)
You can use over-the-counter  "cold" medicines  such as  "Afrin" nasal spray for nasal congestion as directed. Use " Delsym" or" Robitussin" cough syrup varietis for cough.  You can use plain "Tylenol" or "Advil" for fever, chills and achyness. Use Halls or Ricola cough drops.     Please, make an appointment if you are not better or if you're worse.  

## 2019-01-24 NOTE — Assessment & Plan Note (Signed)
Wt Readings from Last 3 Encounters:  01/24/19 223 lb (101.2 kg)  11/24/18 226 lb (102.5 kg)  10/27/18 224 lb (101.6 kg)

## 2019-01-24 NOTE — Assessment & Plan Note (Signed)
Labs

## 2019-03-04 ENCOUNTER — Other Ambulatory Visit: Payer: Self-pay | Admitting: Internal Medicine

## 2019-03-04 DIAGNOSIS — D508 Other iron deficiency anemias: Secondary | ICD-10-CM

## 2019-03-21 ENCOUNTER — Other Ambulatory Visit: Payer: Self-pay | Admitting: Internal Medicine

## 2019-04-04 ENCOUNTER — Ambulatory Visit: Payer: BC Managed Care – PPO | Admitting: Internal Medicine

## 2019-04-06 ENCOUNTER — Other Ambulatory Visit: Payer: Self-pay | Admitting: Internal Medicine

## 2019-05-18 ENCOUNTER — Other Ambulatory Visit: Payer: Self-pay | Admitting: Internal Medicine

## 2019-07-04 ENCOUNTER — Other Ambulatory Visit: Payer: Self-pay | Admitting: Internal Medicine

## 2019-08-09 ENCOUNTER — Other Ambulatory Visit: Payer: Self-pay

## 2019-08-09 ENCOUNTER — Encounter: Payer: Self-pay | Admitting: Internal Medicine

## 2019-08-09 ENCOUNTER — Ambulatory Visit (INDEPENDENT_AMBULATORY_CARE_PROVIDER_SITE_OTHER): Payer: BC Managed Care – PPO | Admitting: Internal Medicine

## 2019-08-09 VITALS — BP 120/76 | HR 92 | Ht 67.0 in | Wt 223.0 lb

## 2019-08-09 DIAGNOSIS — G43009 Migraine without aura, not intractable, without status migrainosus: Secondary | ICD-10-CM | POA: Diagnosis not present

## 2019-08-09 DIAGNOSIS — E1165 Type 2 diabetes mellitus with hyperglycemia: Secondary | ICD-10-CM

## 2019-08-09 DIAGNOSIS — Z0000 Encounter for general adult medical examination without abnormal findings: Secondary | ICD-10-CM

## 2019-08-09 DIAGNOSIS — D509 Iron deficiency anemia, unspecified: Secondary | ICD-10-CM | POA: Diagnosis not present

## 2019-08-09 DIAGNOSIS — Z23 Encounter for immunization: Secondary | ICD-10-CM

## 2019-08-09 DIAGNOSIS — R1319 Other dysphagia: Secondary | ICD-10-CM

## 2019-08-09 DIAGNOSIS — R1310 Dysphagia, unspecified: Secondary | ICD-10-CM | POA: Insufficient documentation

## 2019-08-09 MED ORDER — PANTOPRAZOLE SODIUM 40 MG PO TBEC: 40.0000 mg | DELAYED_RELEASE_TABLET | Freq: Two times a day (BID) | ORAL | 3 refills | Status: DC

## 2019-08-09 MED ORDER — METFORMIN HCL 500 MG PO TABS: 1000.0000 mg | ORAL_TABLET | Freq: Two times a day (BID) | ORAL | 3 refills | Status: DC

## 2019-08-09 MED ORDER — ALPRAZOLAM 0.5 MG PO TABS: 0.5000 mg | ORAL_TABLET | Freq: Two times a day (BID) | ORAL | 2 refills | Status: DC

## 2019-08-09 NOTE — Progress Notes (Signed)
Subjective:  Patient ID: [name] [name], female    DOB: [birth year]  Age: 53 y.o. MRN: [id number]  CC: No chief complaint on file.   HPI RUBI TOOLEY presents for well exam C/o trouble swallowing x months C/o stress F/u depressed mood, DM  Outpatient Medications Prior to Visit  Medication Sig Dispense Refill  . aspirin 81 MG chewable tablet Chew 1 tablet (81 mg total) by mouth daily. 90 tablet 1  . atorvastatin (LIPITOR) 10 MG tablet TAKE 1 TABLET(10 MG) BY MOUTH DAILY 90 tablet 3  . buPROPion (WELLBUTRIN SR) 150 MG 12 hr tablet TAKE 1 TABLET(150 MG) BY MOUTH TWICE DAILY 180 tablet 3  . Cholecalciferol (EQL VITAMIN D3) 1000 units tablet Take 1 tablet (1,000 Units total) by mouth daily. 90 tablet 1  . citalopram (CELEXA) 20 MG tablet TAKE 1 TABLET(20 MG) BY MOUTH DAILY 90 tablet 3  . fenofibrate (TRICOR) 145 MG tablet Take 1 tablet (145 mg total) by mouth daily. Patient needs office visit before refills will be given 90 tablet 1  . JARDIANCE 25 MG TABS tablet TAKE 1 TABLET BY MOUTH DAILY 90 tablet 3  . omeprazole (PRILOSEC) 40 MG capsule TAKE 1 CAPSULE(40 MG) BY MOUTH DAILY 90 capsule 3  . repaglinide (PRANDIN) 2 MG tablet TAKE 1 TABLET(2 MG) BY MOUTH THREE TIMES DAILY BEFORE MEALS 270 tablet 3  . triamcinolone ointment (KENALOG) 0.1 % Apply 1 application topically 3 (three) times daily. 30 g 1  . ALPRAZolam (XANAX) 0.5 MG tablet TAKE ONE TABLET BY MOUTH TWICE DAILY 60 tablet 2  . metFORMIN (GLUCOPHAGE) 500 MG tablet Take 2 tablets (1,000 mg total) by mouth 2 (two) times a day. Patient needs office visit before refills will be given 360 tablet 0  . azithromycin (ZITHROMAX Z-PAK) 250 MG tablet As directed 6 tablet 0   No facility-administered medications prior to visit.     ROS: Review of Systems  Constitutional: Negative for activity change, appetite change, chills, fatigue and unexpected weight change.  HENT: Negative for congestion, mouth sores and sinus pressure.   Eyes:  Negative for visual disturbance.  Respiratory: Negative for cough and chest tightness.   Gastrointestinal: Negative for abdominal pain and nausea.  Genitourinary: Negative for difficulty urinating, frequency and vaginal pain.  Musculoskeletal: Negative for back pain and gait problem.  Skin: Negative for pallor and rash.  Neurological: Negative for dizziness, tremors, weakness, numbness and headaches.  Psychiatric/Behavioral: Negative for confusion and sleep disturbance. The patient is nervous/anxious.   dysphagia Eating ice  Objective:  BP 120/76 (BP Location: Left Arm, Patient Position: Sitting, Cuff Size: Large)   Pulse 92   Ht 5\' 7"  (1.702 m)   Wt 223 lb (101.2 kg)   SpO2 98%   BMI 34.93 kg/m   BP Readings from Last 3 Encounters:  08/09/19 120/76  01/24/19 140/68  11/24/18 (!) 160/80    Wt Readings from Last 3 Encounters:  08/09/19 223 lb (101.2 kg)  01/24/19 223 lb (101.2 kg)  11/24/18 226 lb (102.5 kg)    Physical Exam Constitutional:      General: She is not in acute distress.    Appearance: She is well-developed.  HENT:     Head: Normocephalic.     Right Ear: External ear normal.     Left Ear: External ear normal.     Nose: Nose normal.  Eyes:     General:        Right eye: No discharge.  Left eye: No discharge.     Conjunctiva/sclera: Conjunctivae normal.     Pupils: Pupils are equal, round, and reactive to light.  Neck:     Musculoskeletal: Normal range of motion and neck supple.     Thyroid: No thyromegaly.     Vascular: No JVD.     Trachea: No tracheal deviation.  Cardiovascular:     Rate and Rhythm: Normal rate and regular rhythm.     Heart sounds: Normal heart sounds.  Pulmonary:     Effort: No respiratory distress.     Breath sounds: No stridor. No wheezing.  Abdominal:     General: Bowel sounds are normal. There is no distension.     Palpations: Abdomen is soft. There is no mass.     Tenderness: There is no abdominal tenderness. There  is no guarding or rebound.  Musculoskeletal:        General: No tenderness.  Lymphadenopathy:     Cervical: No cervical adenopathy.  Skin:    Findings: No erythema or rash.  Neurological:     Cranial Nerves: No cranial nerve deficit.     Motor: No abnormal muscle tone.     Coordination: Coordination normal.     Deep Tendon Reflexes: Reflexes normal.  Psychiatric:        Behavior: Behavior normal.        Thought Content: Thought content normal.        Judgment: Judgment normal.     Lab Results  Component Value Date   WBC 7.4 07/07/2017   HGB 12.7 07/07/2017   HCT 38.4 07/07/2017   PLT 211.0 07/07/2017   GLUCOSE 163 (H) 07/28/2018   CHOL 169 10/23/2017   TRIG 149.0 10/23/2017   HDL 41.80 10/23/2017   LDLDIRECT 57.0 03/26/2017   LDLCALC 97 10/23/2017   ALT 31 10/23/2017   AST 31 10/23/2017   NA 139 07/28/2018   K 3.8 07/28/2018   CL 106 07/28/2018   CREATININE 0.64 07/28/2018   BUN 9 07/28/2018   CO2 26 07/28/2018   TSH 1.94 07/07/2017   HGBA1C 8.6 (H) 07/28/2018    Dg Cervical Spine 2 Or 3 Views  Result Date: 10/28/2018 CLINICAL DATA:  Left neck and shoulder pain for 3 weeks EXAM: CERVICAL SPINE - 2-3 VIEW COMPARISON:  None. FINDINGS: Bulky ventral endplate spurring from O8-C1 without disc space loss, compatible with diffuse idiopathic skeletal hyperostosis. This is bridging at C2-3 at least. There is mass effect on the posterior pharynx. No evidence of fracture or bone lesion. IMPRESSION: No acute finding. 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 Electronically Signed   By: Monte Fantasia M.D.   On: 10/28/2018 09:09    Assessment & Plan:   Diagnoses and all orders for this visit:  Need for influenza vaccination -     Flu Vaccine QUAD 36+ mos IM  Other orders -     ALPRAZolam (XANAX) 0.5 MG tablet; Take 1 tablet (0.5 mg total) by mouth 2 (two) times daily. -     metFORMIN (GLUCOPHAGE) 500 MG tablet; Take 2 tablets (1,000 mg total) by mouth 2 (two)  times daily with a meal. Patient needs office visit before refills will be given     Meds ordered this encounter  Medications  . ALPRAZolam (XANAX) 0.5 MG tablet    Sig: Take 1 tablet (0.5 mg total) by mouth 2 (two) times daily.    Dispense:  60 tablet    Refill:  2  . metFORMIN (GLUCOPHAGE)  500 MG tablet    Sig: Take 2 tablets (1,000 mg total) by mouth 2 (two) times daily with a meal. Patient needs office visit before refills will be given    Dispense:  360 tablet    Refill:  3     Follow-up: No follow-ups on file.  [name], MD

## 2019-08-09 NOTE — Assessment & Plan Note (Signed)
Labs

## 2019-08-09 NOTE — Assessment & Plan Note (Signed)
No HAs 

## 2019-08-09 NOTE — Assessment & Plan Note (Signed)
We discussed age appropriate health related issues, including available/recomended screening tests and vaccinations. We discussed a need for adhering to healthy diet and exercise. Labs were ordered to be later reviewed . All questions were answered.   

## 2019-08-09 NOTE — Patient Instructions (Addendum)
These suggestions will probably help you to improve your metabolism if you are not overweight and to lose weight if you are overweight: 1.  Reduce your consumption of sugars and starches.  Eliminate high fructose corn syrup from your diet.  Reduce your consumption of processed foods.  For desserts try to have seasonal fruits, berries, nuts, cheeses or dark chocolate with more than 70% cacao. 2.  Do not snack 3.  You do not have to eat breakfast.  If you choose to have breakfast-eat plain greek yogurt, eggs, oatmeal (without sugar) 4.  Drink water, freshly brewed unsweetened tea (green, black or herbal) or coffee.  Do not drink sodas including diet sodas , juices, beverages sweetened with artificial sweeteners. 5.  Reduce your consumption of refined grains. 6.  Avoid protein drinks such as Optifast, Slim fast etc. Eat chicken, fish, meat, dairy and beans for your sources of protein 7.  Natural unprocessed fats like cold pressed virgin olive oil, butter, coconut oil are good for you.  Eat avocados 8.  Increase your consumption of fiber.  Fruits, berries, vegetables, whole grains, flaxseeds, Chia seeds, beans, popcorn, nuts, oatmeal are good sources of fiber 9.  Use vinegar in your diet, i.e. apple cider vinegar, red wine or balsamic vinegar 10.  You can try fasting.  For example you can skip breakfast and lunch every other day (24-hour fast) 11.  Stress reduction, good night sleep, relaxation, meditation, yoga and other physical activity is likely to help you to maintain low weight too. 12.  If you drink alcohol, limit your alcohol intake to no more than 2 drinks a day.   Mediterranean diet is good for you. ([employer] has a typical Mediterranean cuisine menu) The Mediterranean diet is a way of eating based on the traditional cuisine of countries bordering the [place] Sea. While there is no single definition of the Mediterranean diet, it is typically high in vegetables, fruits, whole grains,  beans, nut and seeds, and olive oil. The main components of Mediterranean diet include: . Daily consumption of vegetables, fruits, whole grains and healthy fats  . Weekly intake of fish, poultry, beans and eggs  . Moderate portions of dairy products  . Limited intake of red meat Other important elements of the Mediterranean diet are sharing meals with family and friends, enjoying a glass of red wine and being physically active. Health benefits of a Mediterranean diet: A traditional Mediterranean diet consisting of large quantities of fresh fruits and vegetables, nuts, fish and olive oil-coupled with physical activity-can reduce your risk of serious mental and physical health problems by: Preventing heart disease and strokes. Following a Mediterranean diet limits your intake of refined breads, processed foods, and red meat, and encourages drinking red wine instead of hard liquor-all factors that can help prevent heart disease and stroke. Keeping you agile. If you're an older adult, the nutrients gained with a Mediterranean diet may reduce your risk of developing muscle weakness and other signs of frailty by about 70 percent. Reducing the risk of Alzheimer's. Research suggests that the Mediterranean diet may improve cholesterol, blood sugar levels, and overall blood vessel health, which in turn may reduce your risk of Alzheimer's disease or dementia. Halving the risk of Parkinson's disease. The high levels of antioxidants in the Mediterranean diet can prevent cells from undergoing a damaging process called oxidative stress, thereby cutting the risk of Parkinson's disease in half. Increasing longevity. By reducing your risk of developing heart disease or cancer with the Mediterranean diet,   you're reducing your risk of death at any age by 20%. Protecting against type 2 diabetes. A Mediterranean diet is rich in fiber which digests slowly, prevents huge swings in blood sugar, and can help you maintain a  healthy weight.    Cabbage soup recipe that will not make you gain weight: Take 1 small head of cabbage, 1 average pack of celery, 4 green peppers, 4 onions, 2 cans diced tomatoes (they are not available without salt), salt and spices to taste.  Chop cabbage, celery, peppers and onions.  And tomatoes and 2-2.5 liters (2.5 quarts) of water so that it would just cover the vegetables.  Bring to boil.  Add spices and salt.  Turn heat to low/medium and simmer for 20-25 minutes.  Naturally, you can make a smaller batch and change some of the ingredients.        Gastroesophageal Reflux Disease, Adult Gastroesophageal reflux (GER) happens when acid from the stomach flows up into the tube that connects the mouth and the stomach (esophagus). Normally, food travels down the esophagus and stays in the stomach to be digested. With GER, food and stomach acid sometimes move back up into the esophagus. You may have a disease called gastroesophageal reflux disease (GERD) if the reflux:  Happens often.  Causes frequent or very bad symptoms.  Causes problems such as damage to the esophagus. When this happens, the esophagus becomes sore and swollen (inflamed). Over time, GERD can make small holes (ulcers) in the lining of the esophagus. What are the causes? This condition is caused by a problem with the muscle between the esophagus and the stomach. When this muscle is weak or not normal, it does not close properly to keep food and acid from coming back up from the stomach. The muscle can be weak because of:  Tobacco use.  Pregnancy.  Having a certain type of hernia (hiatal hernia).  Alcohol use.  Certain foods and drinks, such as coffee, chocolate, onions, and peppermint. What increases the risk? You are more likely to develop this condition if you:  Are overweight.  Have a disease that affects your connective tissue.  Use NSAID medicines. What are the signs or symptoms? Symptoms of this  condition include:  Heartburn.  Difficult or painful swallowing.  The feeling of having a lump in the throat.  A bitter taste in the mouth.  Bad breath.  Having a lot of saliva.  Having an upset or bloated stomach.  Belching.  Chest pain. Different conditions can cause chest pain. Make sure you see your doctor if you have chest pain.  Shortness of breath or noisy breathing (wheezing).  Ongoing (chronic) cough or a cough at night.  Wearing away of the surface of teeth (tooth enamel).  Weight loss. How is this treated? Treatment will depend on how bad your symptoms are. Your doctor may suggest:  Changes to your diet.  Medicine.  Surgery. Follow these instructions at home: Eating and drinking   Follow a diet as told by your doctor. You may need to avoid foods and drinks such as: ? Coffee and tea (with or without caffeine). ? Drinks that contain alcohol. ? Energy drinks and sports drinks. ? Bubbly (carbonated) drinks or sodas. ? Chocolate and cocoa. ? Peppermint and mint flavorings. ? Garlic and onions. ? Horseradish. ? Spicy and acidic foods. These include peppers, chili powder, curry powder, vinegar, hot sauces, and BBQ sauce. ? Citrus fruit juices and citrus fruits, such as oranges, lemons, and limes. ?  Tomato-based foods. These include red sauce, chili, salsa, and pizza with red sauce. ? Fried and fatty foods. These include donuts, french fries, potato chips, and high-fat dressings. ? High-fat meats. These include hot dogs, rib eye steak, sausage, ham, and bacon. ? High-fat dairy items, such as whole milk, butter, and cream cheese.  Eat small meals often. Avoid eating large meals.  Avoid drinking large amounts of liquid with your meals.  Avoid eating meals during the 2-3 hours before bedtime.  Avoid lying down right after you eat.  Do not exercise right after you eat. Lifestyle   Do not use any products that contain nicotine or tobacco. These include  cigarettes, e-cigarettes, and chewing tobacco. If you need help quitting, ask your doctor.  Try to lower your stress. If you need help doing this, ask your doctor.  If you are overweight, lose an amount of weight that is healthy for you. Ask your doctor about a safe weight loss goal. General instructions  Pay attention to any changes in your symptoms.  Take over-the-counter and prescription medicines only as told by your doctor. Do not take aspirin, ibuprofen, or other NSAIDs unless your doctor says it is okay.  Wear loose clothes. Do not wear anything tight around your waist.  Raise (elevate) the head of your bed about 6 inches (15 cm).  Avoid bending over if this makes your symptoms worse.  Keep all follow-up visits as told by your doctor. This is important. Contact a doctor if:  You have new symptoms.  You lose weight and you do not know why.  You have trouble swallowing or it hurts to swallow.  You have wheezing or a cough that keeps happening.  Your symptoms do not get better with treatment.  You have a hoarse voice. Get help right away if:  You have pain in your arms, neck, jaw, teeth, or back.  You feel sweaty, dizzy, or light-headed.  You have chest pain or shortness of breath.  You throw up (vomit) and your throw-up looks like blood or coffee grounds.  You pass out (faint).  Your poop (stool) is bloody or black.  You cannot swallow, drink, or eat. Summary  If a person has gastroesophageal reflux disease (GERD), food and stomach acid move back up into the esophagus and cause symptoms or problems such as damage to the esophagus.  Treatment will depend on how bad your symptoms are.  Follow a diet as told by your doctor.  Take all medicines only as told by your doctor. This information is not intended to replace advice given to you by your health care provider. Make sure you discuss any questions you have with your health care provider. Document Released:  05/26/2008 Document Revised: 06/16/2018 Document Reviewed: 06/16/2018 Elsevier Patient Education  2020 [employer].

## 2019-08-09 NOTE — Assessment & Plan Note (Addendum)
x months Protonix BID GI ref

## 2019-08-09 NOTE — Assessment & Plan Note (Signed)
Labs Cont Rx

## 2019-08-11 ENCOUNTER — Other Ambulatory Visit (INDEPENDENT_AMBULATORY_CARE_PROVIDER_SITE_OTHER): Payer: BC Managed Care – PPO

## 2019-08-11 DIAGNOSIS — Z0000 Encounter for general adult medical examination without abnormal findings: Secondary | ICD-10-CM | POA: Diagnosis not present

## 2019-08-11 DIAGNOSIS — E1165 Type 2 diabetes mellitus with hyperglycemia: Secondary | ICD-10-CM

## 2019-08-11 DIAGNOSIS — D509 Iron deficiency anemia, unspecified: Secondary | ICD-10-CM

## 2019-08-11 LAB — URINALYSIS
Bilirubin Urine: NEGATIVE
Hgb urine dipstick: NEGATIVE
Ketones, ur: NEGATIVE
Leukocytes,Ua: NEGATIVE
Nitrite: NEGATIVE
Specific Gravity, Urine: 1.01 (ref 1.000–1.030)
Total Protein, Urine: NEGATIVE
Urine Glucose: >=1000 — AB
Urobilinogen, UA: 0.2 (ref 0.0–1.0)
pH: 5.5 (ref 5.0–8.0)

## 2019-08-11 LAB — CBC WITH DIFFERENTIAL/PLATELET
Basophils Absolute: 0.1 10*3/uL (ref 0.0–0.1)
Basophils Relative: 1.3 % (ref 0.0–3.0)
Eosinophils Absolute: 0.1 10*3/uL (ref 0.0–0.7)
Eosinophils Relative: 2.1 % (ref 0.0–5.0)
HCT: 29.7 % — ABNORMAL LOW (ref 36.0–46.0)
Hemoglobin: 9.2 g/dL — ABNORMAL LOW (ref 12.0–15.0)
Lymphocytes Relative: 23.6 % (ref 12.0–46.0)
Lymphs Abs: 1.4 10*3/uL (ref 0.7–4.0)
MCHC: 31 g/dL (ref 30.0–36.0)
MCV: 74.1 fl — ABNORMAL LOW (ref 78.0–100.0)
Monocytes Absolute: 0.3 10*3/uL (ref 0.1–1.0)
Monocytes Relative: 5 % (ref 3.0–12.0)
Neutro Abs: 4.2 10*3/uL (ref 1.4–7.7)
Neutrophils Relative %: 68 % (ref 43.0–77.0)
Platelets: 194 10*3/uL (ref 150.0–400.0)
RBC: 4 Mil/uL (ref 3.87–5.11)
RDW: 17.4 % — ABNORMAL HIGH (ref 11.5–15.5)
WBC: 6.1 10*3/uL (ref 4.0–10.5)

## 2019-08-11 LAB — BASIC METABOLIC PANEL
BUN: 15 mg/dL (ref 6–23)
CO2: 22 mEq/L (ref 19–32)
Calcium: 9.4 mg/dL (ref 8.4–10.5)
Chloride: 102 mEq/L (ref 96–112)
Creatinine, Ser: 0.78 mg/dL (ref 0.40–1.20)
GFR: 77.97 mL/min (ref >60.00)
Glucose, Bld: 250 mg/dL — ABNORMAL HIGH (ref 70–99)
Potassium: 4.1 mEq/L (ref 3.5–5.1)
Sodium: 136 mEq/L (ref 135–145)

## 2019-08-11 LAB — LDL CHOLESTEROL, DIRECT: Direct LDL: 69 mg/dL

## 2019-08-11 LAB — TSH: TSH: 2.31 u[IU]/mL (ref 0.35–4.50)

## 2019-08-11 LAB — HEPATIC FUNCTION PANEL
ALT: 43 U/L — ABNORMAL HIGH (ref 0–35)
AST: 42 U/L — ABNORMAL HIGH (ref 0–37)
Albumin: 4.5 g/dL (ref 3.5–5.2)
Alkaline Phosphatase: 60 U/L (ref 39–117)
Bilirubin, Direct: 0.1 mg/dL (ref 0.0–0.3)
Total Bilirubin: 0.7 mg/dL (ref 0.2–1.2)
Total Protein: 7.2 g/dL (ref 6.0–8.3)

## 2019-08-11 LAB — LIPID PANEL
Cholesterol: 156 mg/dL (ref 0–200)
HDL: 28.1 mg/dL — ABNORMAL LOW (ref >39.00)
Total CHOL/HDL Ratio: 6
Triglycerides: 554 mg/dL — ABNORMAL HIGH (ref 0.0–149.0)

## 2019-08-11 LAB — MICROALBUMIN / CREATININE URINE RATIO
Creatinine,U: 21.1 mg/dL
Microalb Creat Ratio: 7.3 mg/g (ref 0.0–30.0)
Microalb, Ur: 1.5 mg/dL (ref 0.0–1.9)

## 2019-08-11 LAB — HEMOGLOBIN A1C: Hgb A1c MFr Bld: 8.8 % — ABNORMAL HIGH (ref 4.6–6.5)

## 2019-08-12 LAB — IRON,TIBC AND FERRITIN PANEL
%SAT: 8 % (calc) — ABNORMAL LOW (ref 16–45)
Ferritin: 9 ng/mL — ABNORMAL LOW (ref 16–232)
Iron: 36 ug/dL — ABNORMAL LOW (ref 45–160)
TIBC: 438 mcg/dL (calc) (ref 250–450)

## 2019-08-13 ENCOUNTER — Other Ambulatory Visit: Payer: Self-pay | Admitting: Internal Medicine

## 2019-08-13 MED ORDER — FERROUS SULFATE 325 (65 FE) MG PO TABS: 325.0000 mg | ORAL_TABLET | Freq: Every day | ORAL | 1 refills | Status: DC

## 2019-08-16 ENCOUNTER — Encounter: Payer: Self-pay | Admitting: Internal Medicine

## 2019-08-16 ENCOUNTER — Other Ambulatory Visit: Payer: Self-pay | Admitting: Internal Medicine

## 2019-08-16 DIAGNOSIS — D509 Iron deficiency anemia, unspecified: Secondary | ICD-10-CM

## 2019-08-23 ENCOUNTER — Telehealth: Payer: Self-pay | Admitting: Oncology

## 2019-08-23 NOTE — Telephone Encounter (Signed)
Returned patient's phone call regarding scheduling an appointment, sent a message to provider for more assistance before scheduling this appointment. Patient will get a call back.

## 2019-08-24 ENCOUNTER — Other Ambulatory Visit: Payer: Self-pay

## 2019-08-24 ENCOUNTER — Ambulatory Visit (INDEPENDENT_AMBULATORY_CARE_PROVIDER_SITE_OTHER): Payer: BC Managed Care – PPO | Admitting: Internal Medicine

## 2019-08-24 ENCOUNTER — Encounter: Payer: Self-pay | Admitting: Internal Medicine

## 2019-08-24 VITALS — BP 138/72 | HR 108 | Temp 97.8°F | Ht 67.5 in | Wt 220.0 lb

## 2019-08-24 DIAGNOSIS — R1310 Dysphagia, unspecified: Secondary | ICD-10-CM

## 2019-08-24 DIAGNOSIS — D509 Iron deficiency anemia, unspecified: Secondary | ICD-10-CM

## 2019-08-24 DIAGNOSIS — K219 Gastro-esophageal reflux disease without esophagitis: Secondary | ICD-10-CM

## 2019-08-24 DIAGNOSIS — Z1211 Encounter for screening for malignant neoplasm of colon: Secondary | ICD-10-CM | POA: Diagnosis not present

## 2019-08-24 MED ORDER — NA SULFATE-K SULFATE-MG SULF 17.5-3.13-1.6 GM/177ML PO SOLN: 1.0000 | Freq: Once | ORAL | 0 refills | Status: AC

## 2019-08-24 NOTE — Progress Notes (Signed)
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 Alain Marion regarding new onset dysphasia.  Patient has a 20-year history of GERD requiring PPI therapy for control of symptoms.  Without PPI therapy, she has severe GERD symptoms.  Over the past 4 to 5 months she reports intermittent solid food dysphasia.  She points toward the cervical esophagus.  Occasionally will regurgitate food.  No weight loss.  About 2 weeks ago she was changed from omeprazole to pantoprazole and has noticed no difference in her swallowing issues.  Reflux continues to be controlled in terms of pyrosis.  Next, she has been noted to have recurrent iron deficiency anemia.  She was initially diagnosed in June 2013.  Around that time the hemoglobin was 7.9 with MCV 67.7.  She was seen by hematology and underwent iron replacement therapy.  Blood work from July 2018 showed normalization of hemoglobin at 12.7 with MCV 82.6.  No interval blood work until recently.  Currently hemoglobin 9.2 with MCV 74.1.  Ferritin 9.  Iron saturation 8%.  Patient was initially felt to have iron deficiency anemia based on heavy menstrual flow.  However, she has not had a menstrual period in a few years.  She denies melena or hematochezia.  She has not undergone prior upper endoscopy or colonoscopy.  She does have a history of colon cancer in a first cousin about her age.  She is diabetic.  Review of x-ray file shows normal intraoperative cholangiogram July 2006.  She does tell me that she was found to have  REVIEW OF SYSTEMS:  All non-GI ROS negative unless otherwise stated in the HPI except for depression, fatigue, headaches  Past Medical History:  Diagnosis Date  . Anxiety   . CHF (congestive heart failure) ([place]) 2005   peripartum cardiomyopathy  . Depression   . Diabetes mellitus    type II  . 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 Hydradenitis     right    Past Surgical History:  Procedure Laterality Date  . CHOLECYSTECTOMY  2006    Social History JOSEE STIGEN  reports that she has been smoking. She has a 7.50 pack-year smoking history. She has never used smokeless tobacco. She reports that she does not drink alcohol or use drugs.  family history includes Colon cancer in her paternal aunt; Colon polyps in her maternal grandmother; Diabetes in her mother; Hypertension in her mother and another family member; Inflammatory bowel disease in her maternal aunt.  No Known Allergies     PHYSICAL EXAMINATION: Vital signs: BP 138/72 (BP Location: Right Arm, Patient Position: Sitting, Cuff Size: Normal)   Pulse (!) 108   Temp 97.8 F (36.6 C) (Oral)   Ht 5' 7.5" (1.715 m)   Wt 220 lb (99.8 kg)   BMI 33.95 kg/m   Constitutional: generally well-appearing, no acute distress Psychiatric: alert and oriented x3, cooperative Eyes: extraocular movements intact, anicteric, conjunctiva slightly pale Mouth: oral pharynx moist, no lesions Neck: supple no lymphadenopathy Cardiovascular: heart regular rate and rhythm, no murmur Lungs: clear to auscultation bilaterally Abdomen: soft, obese, nontender, nondistended, no obvious ascites, no peritoneal signs, normal bowel sounds, no organomegaly Rectal: Deferred until colonoscopy Extremities: no clubbing, cyanosis, or lower extremity edema bilaterally Skin: no lesions on visible extremities Neuro: No focal deficits.  Cranial nerves intact  ASSESSMENT:  1.  GERD.  Longstanding and requiring PPI  therapy to control symptoms.  Rule out Barrett's esophagus 2.  New onset intermittent solid food dysphasia.  Rule out peptic stricture.  Rule out neoplasm 3.  Iron deficiency anemia.  Recurrent.  Previously attributed to heavy menstrual flow.  No menses in 2 years.  Rule out underlying GI mucosal abnormality 4.  Morbid obesity 5.  Diabetes mellitus 6.  Status post cholecystectomy 7.  Colon cancer  screening.  Due  PLAN:  1.  Reflux precautions with attention to weight loss 2.  Continue pantoprazole 40 mg daily 3.  Schedule upper endoscopy with possible esophageal dilation to evaluate chronic GERD, dysphagia, and iron deficiency anemia.  Patient is high risk given her diabetes and body habitus.The nature of the procedure, as well as the risks, benefits, and alternatives were carefully and thoroughly reviewed with the patient. Ample time for discussion and questions allowed. The patient understood, was satisfied, and agreed to proceed. 4.  Schedule colonoscopy for colon cancer screening and to evaluate iron deficiency anemia.  Patient is high risk as above.The nature of the procedure, as well as the risks, benefits, and alternatives were carefully and thoroughly reviewed with the patient. Ample time for discussion and questions allowed. The patient understood, was satisfied, and agreed to proceed. 5.  Hold diabetic medications the day of the procedures in order to avoid unwanted hypoglycemia 6.  Iron replacement therapy continues to be under the direction of hematology.  She has scheduled a follow-up appointment Copy this consultation note has been sent to Dr. Alain Marion

## 2019-08-24 NOTE — Patient Instructions (Signed)
You have been scheduled for an endoscopy and colonoscopy. Please follow the written instructions given to you at your visit today. Please pick up your prep supplies at the pharmacy within the next 1-3 days. If you use inhalers (even only as needed), please bring them with you on the day of your procedure.  

## 2019-08-30 ENCOUNTER — Telehealth: Payer: Self-pay | Admitting: Nurse Practitioner

## 2019-08-30 NOTE — Telephone Encounter (Signed)
Called patient regarding scheduled appointments on 09/23, patient has been notified. Provider approved.

## 2019-09-13 ENCOUNTER — Other Ambulatory Visit: Payer: Self-pay | Admitting: *Deleted

## 2019-09-13 DIAGNOSIS — D509 Iron deficiency anemia, unspecified: Secondary | ICD-10-CM

## 2019-09-13 NOTE — Progress Notes (Addendum)
[place]   [phone] [phone]   Clinic Follow up Note   Patient Care Team: [name], [name], MD as PCP - General Paula Compton, MD (Obstetrics and Gynecology) Lelon Perla, MD (Cardiology) 09/14/2019  CHIEF COMPLAINT: Iron deficiency anemia  CURRENT THERAPY: Periodic IV Feraheme, last dose 12/19/2015  INTERVAL HISTORY: Mr. Ludwigsen presents for labs and follow-up.  She was last seen by Dr. Alen Blew on 05/01/2016, labs at that time showed normal hemoglobin 13.7, ferritin 46, serum iron 60 all in normal range. Her last IV Feraheme infusion was in 12/19/2015. Initially her iron deficiency anemia was felt to be related to heavy menstrual bleeding. She has not had a menstrual period in a few years. She was found to have recurrence of iron deficiency anemia in 07/2019, Hgb 9.2, MCV 74.1, low serum iron 36 and elevated TIBC 438, ferritin of 9.   Today, she presents by herself. She reports feeling increasingly fatigued over the last year. She is a Pharmacist, hospital, working virtually for now. She can barely make it through a day and could go to sleep at 3 pm. Has mild dyspnea on exertion. Denies cough, chest pain. She reports pica with ice also over 1 year. She has been seen by PCP q4 months in the interim but iron not checked until recently. She was prescribed oral iron tab once daily but has not started yet. She reports tolerating oral iron fine without GI upset. Denies n/v/c/d. Denies any pain. Takes baby aspirin for heart health. Denies GI, Gu/GYN, oral bleeding or epistaxis. LMP 2 years ago.  She eats normal diet with meat occasionally. She notes solid dysphagia over the last 6 months with unintentional weight loss over 3 months, she lost 7 lbs in the last month. She recently saw Dr. Henrene Pastor who has scheduled her to EGD/colonoscopy on 10/5. She is not up to date on mammogram or PAP but plans to do all screenings later in October.    MEDICAL HISTORY:  Past  Medical History:  Diagnosis Date  . Anxiety   . CHF (congestive heart failure) ([place]) 2005   peripartum cardiomyopathy  . Depression   . Diabetes mellitus    type II  . 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 Hydradenitis    right    SURGICAL HISTORY: Past Surgical History:  Procedure Laterality Date  . CHOLECYSTECTOMY  2006    I have reviewed the social history and family history with the patient and they are unchanged from previous note.  ALLERGIES:  has No Known Allergies.  MEDICATIONS:  Current Outpatient Medications  Medication Sig Dispense Refill  . ALPRAZolam (XANAX) 0.5 MG tablet Take 1 tablet (0.5 mg total) by mouth 2 (two) times daily. 60 tablet 2  . aspirin 81 MG chewable tablet Chew 1 tablet (81 mg total) by mouth daily. 90 tablet 1  . atorvastatin (LIPITOR) 10 MG tablet TAKE 1 TABLET(10 MG) BY MOUTH DAILY 90 tablet 3  . buPROPion (WELLBUTRIN SR) 150 MG 12 hr tablet TAKE 1 TABLET(150 MG) BY MOUTH TWICE DAILY 180 tablet 3  . Cholecalciferol (EQL VITAMIN D3) 1000 units tablet Take 1 tablet (1,000 Units total) by mouth daily. 90 tablet 1  . citalopram (CELEXA) 20 MG tablet TAKE 1 TABLET(20 MG) BY MOUTH DAILY 90 tablet 3  . fenofibrate (TRICOR) 145 MG tablet Take 1 tablet (145 mg total) by mouth daily. Patient needs office visit before refills will be given 90 tablet 1  . ferrous sulfate 325 (65  FE) MG tablet Take 1 tablet (325 mg total) by mouth daily. 90 tablet 1  . JARDIANCE 25 MG TABS tablet TAKE 1 TABLET BY MOUTH DAILY 90 tablet 3  . metFORMIN (GLUCOPHAGE) 500 MG tablet Take 2 tablets (1,000 mg total) by mouth 2 (two) times daily with a meal. Patient needs office visit before refills will be given 360 tablet 3  . pantoprazole (PROTONIX) 40 MG tablet Take 1 tablet (40 mg total) by mouth 2 (two) times daily before a meal. 180 tablet 3  . repaglinide (PRANDIN) 2 MG tablet TAKE 1 TABLET(2 MG) BY MOUTH THREE TIMES DAILY BEFORE MEALS 270 tablet 3  . triamcinolone ointment (KENALOG) 0.1 %  Apply 1 application topically 3 (three) times daily. 30 g 1   No current facility-administered medications for this visit.     PHYSICAL EXAMINATION: ECOG PERFORMANCE STATUS: 1 - Symptomatic but completely ambulatory  Vitals:   09/14/19 0807  BP: (!) 168/67  Pulse: 97  Resp: 17  Temp: 98 F (36.7 C)  SpO2: 98%   Filed Weights   09/14/19 0807  Weight: 217 lb 3.2 oz (98.5 kg)    GENERAL:alert, no distress and comfortable SKIN: no rash  EYES: sclera clear NECK: without mass LUNGS: clear with normal breathing effort HEART: regular rate & rhythm, no lower extremity edema ABDOMEN: abdomen soft, non-tender and normal bowel sounds Musculoskeletal:no cyanosis of digits  NEURO: alert & oriented x 3 with fluent speech, normal gait   LABORATORY DATA:  I have reviewed the data as listed CBC Latest Ref Rng & Units 09/14/2019 08/11/2019 07/07/2017  WBC 4.0 - 10.5 K/uL 7.2 6.1 7.4  Hemoglobin 12.0 - 15.0 g/dL 9.8(L) 9.2(L) 12.7  Hematocrit 36.0 - 46.0 % 35.0(L) 29.7(L) 38.4  Platelets 150 - 400 K/uL 239 194.0 211.0     CMP Latest Ref Rng & Units 08/11/2019 07/28/2018 02/22/2018  Glucose 70 - 99 mg/dL 250(H) 163(H) 159(H)  BUN 6 - 23 mg/dL 15 9 16   Creatinine 0.40 - 1.20 mg/dL 0.78 0.64 0.71  Sodium 135 - 145 mEq/L 136 139 136  Potassium 3.5 - 5.1 mEq/L 4.1 3.8 3.7  Chloride 96 - 112 mEq/L 102 106 102  CO2 19 - 32 mEq/L 22 26 25   Calcium 8.4 - 10.5 mg/dL 9.4 9.0 9.4  Total Protein 6.0 - 8.3 g/dL 7.2 - -  Total Bilirubin 0.2 - 1.2 mg/dL 0.7 - -  Alkaline Phos 39 - 117 U/L 60 - -  AST 0 - 37 U/L 42(H) - -  ALT 0 - 35 U/L 43(H) - -      RADIOGRAPHIC STUDIES: I have personally reviewed the radiological images as listed and agreed with the findings in the report. No results found.   ASSESSMENT & PLAN: 53 y/o female with iron deficiency anemia   1. Iron deficiency anemia  -last seen by Dr. Alen Blew in 2017.  -Her IDA was previously attributed to heavy menstrual bleeding. LMP now  over 2 years ago -Her anemia resolved with IV Feraheme replacement, last given 11/2015.  -She has had a recurrence of iron deficiency anemia, Hgb 9.8, ferritin today is 14, serum iron 37, TIBC 454 and 8% transferrin saturation.  -Etiology of her recurrence is unknown. She is currently undergoing GI work up with Dr. Henrene Pastor for dysphagia.   2. Dysphagia, weight loss  -progressive over last 6 months  -on PPI for GERD -currently under care of GI Dr. Henrene Pastor -scheduled for EGD/colonoscopy on 09/26/19   3. Health maintenance  -  overdue for age-appropriate cancer screenings  -followed by PCP  Disposition:  Ms. Stidd appears stable. She presents with recurrence of iron deficiency anemia. The cause is unknown. She is symptomatic of anemia, with pica, fatigue, and DOE. She denies bleeding. Due to dyspnagia and recurrent IDA, she is currently being worked up by GI, Dr. Henrene Pastor, with EGD and colonoscopy on 10/5.   In the meantime, I recommend she restart oral iron 1-2 times daily, and take with vitamin C for increased absorption. Given her calculated iron deficit, will schedule her for weekly venofer 200 mg x5. I discussed with pharmacy.   She will return for lab and f/u with Dr. Alen Blew in 3 months. She will have completed GI work up. Will continue to monitor and support with IV iron as needed.  I reviewed the plan with Dr. Alen Blew.   We discussed health maintenance and age appropriate cancer screenings. She is overdue for mammogram, last done in 2013. She will have mammogram and PAP done on 10/30.     Orders Placed This Encounter  Procedures  . CBC with Differential (Cancer Center Only)    Standing Status:   Future    Standing Expiration Date:   09/13/2020  . Iron and TIBC    Standing Status:   Future    Standing Expiration Date:   09/13/2020  . Ferritin    Standing Status:   Future    Standing Expiration Date:   09/13/2020   All questions were answered. The patient knows to call the clinic with any  problems, questions or concerns. No barriers to learning was detected. I spent 20 minutes counseling the patient face to face. The total time spent in the appointment was 25 minutes and more than 50% was on counseling and review of test results     Alla Feeling, NP 09/14/19

## 2019-09-14 ENCOUNTER — Encounter: Payer: Self-pay | Admitting: Nurse Practitioner

## 2019-09-14 ENCOUNTER — Inpatient Hospital Stay: Payer: BC Managed Care – PPO

## 2019-09-14 ENCOUNTER — Inpatient Hospital Stay: Payer: BC Managed Care – PPO | Attending: Nurse Practitioner | Admitting: Nurse Practitioner

## 2019-09-14 ENCOUNTER — Telehealth: Payer: Self-pay | Admitting: Oncology

## 2019-09-14 ENCOUNTER — Other Ambulatory Visit: Payer: Self-pay

## 2019-09-14 ENCOUNTER — Other Ambulatory Visit: Payer: BC Managed Care – PPO

## 2019-09-14 VITALS — BP 168/67 | HR 97 | Temp 98.0°F | Resp 17 | Ht 67.5 in | Wt 217.2 lb

## 2019-09-14 DIAGNOSIS — Z79899 Other long term (current) drug therapy: Secondary | ICD-10-CM | POA: Insufficient documentation

## 2019-09-14 DIAGNOSIS — I509 Heart failure, unspecified: Secondary | ICD-10-CM | POA: Insufficient documentation

## 2019-09-14 DIAGNOSIS — Z7982 Long term (current) use of aspirin: Secondary | ICD-10-CM | POA: Diagnosis not present

## 2019-09-14 DIAGNOSIS — F418 Other specified anxiety disorders: Secondary | ICD-10-CM | POA: Insufficient documentation

## 2019-09-14 DIAGNOSIS — R1310 Dysphagia, unspecified: Secondary | ICD-10-CM | POA: Insufficient documentation

## 2019-09-14 DIAGNOSIS — D509 Iron deficiency anemia, unspecified: Secondary | ICD-10-CM

## 2019-09-14 DIAGNOSIS — K219 Gastro-esophageal reflux disease without esophagitis: Secondary | ICD-10-CM | POA: Insufficient documentation

## 2019-09-14 DIAGNOSIS — N920 Excessive and frequent menstruation with regular cycle: Secondary | ICD-10-CM | POA: Diagnosis not present

## 2019-09-14 DIAGNOSIS — E119 Type 2 diabetes mellitus without complications: Secondary | ICD-10-CM | POA: Insufficient documentation

## 2019-09-14 DIAGNOSIS — Z7984 Long term (current) use of oral hypoglycemic drugs: Secondary | ICD-10-CM | POA: Diagnosis not present

## 2019-09-14 DIAGNOSIS — Z8619 Personal history of other infectious and parasitic diseases: Secondary | ICD-10-CM | POA: Diagnosis not present

## 2019-09-14 LAB — CBC WITH DIFFERENTIAL (CANCER CENTER ONLY)
Abs Immature Granulocytes: 0.05 10*3/uL (ref 0.00–0.07)
Basophils Absolute: 0.1 10*3/uL (ref 0.0–0.1)
Basophils Relative: 1 %
Eosinophils Absolute: 0.2 10*3/uL (ref 0.0–0.5)
Eosinophils Relative: 3 %
HCT: 35 % — ABNORMAL LOW (ref 36.0–46.0)
Hemoglobin: 9.8 g/dL — ABNORMAL LOW (ref 12.0–15.0)
Immature Granulocytes: 1 %
Lymphocytes Relative: 22 %
Lymphs Abs: 1.6 10*3/uL (ref 0.7–4.0)
MCH: 22.1 pg — ABNORMAL LOW (ref 26.0–34.0)
MCHC: 28 g/dL — ABNORMAL LOW (ref 30.0–36.0)
MCV: 78.8 fL — ABNORMAL LOW (ref 80.0–100.0)
Monocytes Absolute: 0.4 10*3/uL (ref 0.1–1.0)
Monocytes Relative: 5 %
Neutro Abs: 4.9 10*3/uL (ref 1.7–7.7)
Neutrophils Relative %: 68 %
Platelet Count: 239 10*3/uL (ref 150–400)
RBC: 4.44 MIL/uL (ref 3.87–5.11)
RDW: 16 % — ABNORMAL HIGH (ref 11.5–15.5)
WBC Count: 7.2 10*3/uL (ref 4.0–10.5)
nRBC: 0 % (ref 0.0–0.2)

## 2019-09-14 LAB — IRON AND TIBC
Iron: 37 ug/dL — ABNORMAL LOW (ref 41–142)
Saturation Ratios: 8 % — ABNORMAL LOW (ref 21–57)
TIBC: 454 ug/dL — ABNORMAL HIGH (ref 236–444)
UIBC: 416 ug/dL — ABNORMAL HIGH (ref 120–384)

## 2019-09-14 LAB — FERRITIN: Ferritin: 14 ng/mL (ref 11–307)

## 2019-09-14 NOTE — Telephone Encounter (Signed)
Spoke with patient re appointments for September thru December. Appointments scheduled per 9/23 schedule message. Per message 9/23 schedule message overrides 9/23 los. Message to [facility] re starting iron infusion for next week due to preauth guideline.

## 2019-09-21 ENCOUNTER — Inpatient Hospital Stay: Payer: BC Managed Care – PPO

## 2019-09-21 ENCOUNTER — Other Ambulatory Visit: Payer: Self-pay

## 2019-09-21 VITALS — BP 142/78 | HR 100 | Temp 97.6°F | Resp 18

## 2019-09-21 DIAGNOSIS — D509 Iron deficiency anemia, unspecified: Secondary | ICD-10-CM | POA: Diagnosis not present

## 2019-09-21 MED ORDER — SODIUM CHLORIDE 0.9 % IV SOLN
200.0000 mg | Freq: Once | INTRAVENOUS | Status: AC
  Administered 2019-09-21: 200 mg via INTRAVENOUS
  Filled 2019-09-21: qty 10

## 2019-09-21 MED ORDER — SODIUM CHLORIDE 0.9 % IV SOLN
Freq: Once | INTRAVENOUS | Status: AC
  Administered 2019-09-21: 08:00:00 via INTRAVENOUS
  Filled 2019-09-21: qty 250

## 2019-09-21 NOTE — Patient Instructions (Signed)

## 2019-09-21 NOTE — Progress Notes (Signed)
Patient declined to stay for 30-minute observation after iron infusion

## 2019-09-26 ENCOUNTER — Other Ambulatory Visit: Payer: Self-pay

## 2019-09-26 ENCOUNTER — Ambulatory Visit (AMBULATORY_SURGERY_CENTER): Payer: BC Managed Care – PPO | Admitting: Internal Medicine

## 2019-09-26 ENCOUNTER — Encounter: Payer: Self-pay | Admitting: Internal Medicine

## 2019-09-26 VITALS — BP 115/62 | HR 85 | Temp 98.0°F | Resp 14 | Ht 67.0 in | Wt 220.0 lb

## 2019-09-26 DIAGNOSIS — K222 Esophageal obstruction: Secondary | ICD-10-CM

## 2019-09-26 DIAGNOSIS — R1310 Dysphagia, unspecified: Secondary | ICD-10-CM

## 2019-09-26 DIAGNOSIS — Z1211 Encounter for screening for malignant neoplasm of colon: Secondary | ICD-10-CM | POA: Diagnosis not present

## 2019-09-26 DIAGNOSIS — K3189 Other diseases of stomach and duodenum: Secondary | ICD-10-CM

## 2019-09-26 DIAGNOSIS — D120 Benign neoplasm of cecum: Secondary | ICD-10-CM | POA: Diagnosis not present

## 2019-09-26 DIAGNOSIS — D122 Benign neoplasm of ascending colon: Secondary | ICD-10-CM | POA: Diagnosis not present

## 2019-09-26 DIAGNOSIS — D509 Iron deficiency anemia, unspecified: Secondary | ICD-10-CM

## 2019-09-26 DIAGNOSIS — D123 Benign neoplasm of transverse colon: Secondary | ICD-10-CM

## 2019-09-26 DIAGNOSIS — K219 Gastro-esophageal reflux disease without esophagitis: Secondary | ICD-10-CM | POA: Diagnosis not present

## 2019-09-26 MED ORDER — SODIUM CHLORIDE 0.9 % IV SOLN: 500.0000 mL | Freq: Once | INTRAVENOUS | Status: DC

## 2019-09-26 NOTE — Op Note (Signed)
[place] Patient Name: [name] [name] Procedure Date: 09/26/2019 10:04 AM MRN: [id number] Endoscopist: Docia Chuck. Henrene Pastor , MD Age: 53 Referring MD:  Date of Birth: [birth year] Gender: Female Account #: [id number] Procedure:                Upper GI endoscopy with biopsy; with Venia Minks                            dilation 54 French Indications:              Dysphagia, Iron deficiency anemia Medicines:                Monitored Anesthesia Care Procedure:                Pre-Anesthesia Assessment:                           - Prior to the procedure, a History and Physical                            was performed, and patient medications and                            allergies were reviewed. The patient's tolerance of                            previous anesthesia was also reviewed. The risks                            and benefits of the procedure and the sedation                            options and risks were discussed with the patient.                            All questions were answered, and informed consent                            was obtained. Prior Anticoagulants: The patient has                            taken no previous anticoagulant or antiplatelet                            agents. ASA Grade Assessment: II - A patient with                            mild systemic disease. After reviewing the risks                            and benefits, the patient was deemed in                            satisfactory condition to undergo the procedure.  After obtaining informed consent, the endoscope was                            passed under direct vision. Throughout the                            procedure, the patient's blood pressure, pulse, and                            oxygen saturations were monitored continuously. The                            Endoscope was introduced through the mouth, and                            advanced to the second part  of duodenum. The upper                            GI endoscopy was accomplished without difficulty.                            The patient tolerated the procedure well. Scope In: Scope Out: Findings:                 One benign-appearing, intrinsic mild stenosis was                            found 37 cm from the incisors. This stenosis                            measured 1.5 cm (inner diameter). The scope was                            withdrawn after completing the endoscopic survey.                            Dilation was performed with a Maloney dilator with                            no resistance at 66 Fr.                           The exam of the esophagus was otherwise normal. No                            inflammation. No Barrett's.                           The stomach was normal except for multiple                            diminutive benign fundic gland type polyps.                           The examined  duodenum was normal. Biopsies for                            histology were taken with a cold forceps for                            evaluation of celiac disease.                           The cardia and gastric fundus were normal on                            retroflexion. Complications:            No immediate complications. Estimated Blood Loss:     Estimated blood loss: none. Impression:               1. GERD                           2. Large caliber distal esophageal ring status post                            dilation                           3. Otherwise unremarkable exam status post duodenal                            biopsies to rule out celiac disease for iron                            deficiency anemia. Recommendation:           1. Post dilation diet                           2. Continue current medications                           3. Continue iron replacement therapy under the                            direction of your hematologist                            4. Routine office follow-up with Dr. Henrene Pastor in 4 to                            6 weeks. Docia Chuck. Henrene Pastor, MD 09/26/2019 10:47:01 AM This report has been signed electronically.

## 2019-09-26 NOTE — Op Note (Signed)
[place] Patient Name: [name] [name] Procedure Date: 09/26/2019 10:05 AM MRN: [id number] Endoscopist: Docia Chuck. Henrene Pastor , MD Age: 53 Referring MD:  Date of Birth: [birth year] Gender: Female Account #: [id number] Procedure:                Colonoscopy with cold snare polypectomy x 4 Indications:              Screening for colorectal malignant neoplasm Medicines:                Monitored Anesthesia Care Procedure:                Pre-Anesthesia Assessment:                           - Prior to the procedure, a History and Physical                            was performed, and patient medications and                            allergies were reviewed. The patient's tolerance of                            previous anesthesia was also reviewed. The risks                            and benefits of the procedure and the sedation                            options and risks were discussed with the patient.                            All questions were answered, and informed consent                            was obtained. Prior Anticoagulants: The patient has                            taken no previous anticoagulant or antiplatelet                            agents. ASA Grade Assessment: II - A patient with                            mild systemic disease. After reviewing the risks                            and benefits, the patient was deemed in                            satisfactory condition to undergo the procedure.                           After obtaining informed consent, the colonoscope  was passed under direct vision. Throughout the                            procedure, the patient's blood pressure, pulse, and                            oxygen saturations were monitored continuously. The                            Colonoscope was introduced through the anus and                            advanced to the the cecum, identified by        appendiceal orifice and ileocecal valve. The                            ileocecal valve, appendiceal orifice, and rectum                            were photographed. The quality of the bowel                            preparation was excellent. The colonoscopy was                            performed without difficulty. The patient tolerated                            the procedure well. The bowel preparation used was                            SUPREP via split dose instruction. Scope In: 10:10:34 AM Scope Out: 10:28:21 AM Scope Withdrawal Time: 0 hours 8 minutes 39 seconds  Total Procedure Duration: 0 hours 17 minutes 47 seconds  Findings:                 Four polyps were found in the transverse colon,                            ascending colon and cecum. The polyps were 2 to 6                            mm in size. These polyps were removed with a cold                            snare. Resection and retrieval were complete.                           The exam was otherwise without abnormality on                            direct and retroflexion views. Complications:            No immediate complications. Estimated blood loss:  None. Estimated Blood Loss:     Estimated blood loss: none. Impression:               - Four 2 to 6 mm polyps in the transverse colon, in                            the ascending colon and in the cecum, removed with                            a cold snare. Resected and retrieved.                           - The examination was otherwise normal on direct                            and retroflexion views. Recommendation:           - Repeat colonoscopy in 3 - 5 years for                            surveillance.                           - Patient has a contact number available for                            emergencies. The signs and symptoms of potential                            delayed complications were discussed with the                             patient. Return to normal activities tomorrow.                            Written discharge instructions were provided to the                            patient.                           - Resume previous diet.                           - Continue present medications.                           - Await pathology results.                           - EGD today. Please see report regarding findings                            and final recommendations  N. Henrene Pastor, MD 09/26/2019 10:33:10 AM This report has been signed electronically.

## 2019-09-26 NOTE — Patient Instructions (Addendum)
Thank you for allowing [place] to care for you today!  Await pathology results by mail, approximately 2 weeks.  Recommendation for next colonoscopy will be made at that time.  GERD handout provided.  Resume previous medications today.  Return to your normal activities tomorrow.  Follow post -dilation diet today.  Handout provided.  Continue iron replacement therapy under the direction of your hematologist.  Routine office follow up with Dr Henrene Pastor in 4-6 weeks.  We will call you  to make this appointment.  If you have not heard from [place] within a week, please call us.     YOU HAD AN ENDOSCOPIC PROCEDURE TODAY AT [facility]:   Refer to the procedure report that was given to you for any specific questions about what was found during the examination.  If the procedure report does not answer your questions, please call your gastroenterologist to clarify.  If you requested that your care partner not be given the details of your procedure findings, then the procedure report has been included in a sealed envelope for you to review at your convenience later.  YOU SHOULD EXPECT: Some feelings of bloating in the abdomen. Passage of more gas than usual.  Walking can help get rid of the air that was put into your GI tract during the procedure and reduce the bloating. If you had a lower endoscopy (such as a colonoscopy or flexible sigmoidoscopy) you may notice spotting of blood in your stool or on the toilet paper. If you underwent a bowel prep for your procedure, you may not have a normal bowel movement for a few days.  Please Note:  You might notice some irritation and congestion in your nose or some drainage.  This is from the oxygen used during your procedure.  There is no need for concern and it should clear up in a day or so.  SYMPTOMS TO REPORT IMMEDIATELY:   Following lower endoscopy (colonoscopy or flexible sigmoidoscopy):  Excessive amounts of blood in the stool  Significant  tenderness or worsening of abdominal pains  Swelling of the abdomen that is new, acute  Fever of 100F or higher   Following upper endoscopy (EGD)  Vomiting of blood or coffee ground material  New chest pain or pain under the shoulder blades  Painful or persistently difficult swallowing  New shortness of breath  Fever of 100F or higher  Black, tarry-looking stools  For urgent or emergent issues, a gastroenterologist can be reached at any hour by calling [phone].   DIET:  We do recommend a small meal at first, but then you may proceed to your regular diet.  Drink plenty of fluids but you should avoid alcoholic beverages for 24 hours.  ACTIVITY:  You should plan to take it easy for the rest of today and you should NOT DRIVE or use heavy machinery until tomorrow (because of the sedation medicines used during the test).    FOLLOW UP: Our staff will call the number listed on your records 48-72 hours following your procedure to check on you and address any questions or concerns that you may have regarding the information given to you following your procedure. If we do not reach you, we will leave a message.  We will attempt to reach you two times.  During this call, we will ask if you have developed any symptoms of COVID 19. If you develop any symptoms (ie: fever, flu-like symptoms, shortness of breath, cough etc.) before then, please call [phone].  If you test positive for Covid 19 in the 2 weeks post procedure, please call and report this information to [place].    If any biopsies were taken you will be contacted by phone or by letter within the next 1-3 weeks.  Please call us at [phone] if you have not heard about the biopsies in 3 weeks.    SIGNATURES/CONFIDENTIALITY: You and/or your care partner have signed paperwork which will be entered into your electronic medical record.  These signatures attest to the fact that that the information above on your After Visit Summary has  been reviewed and is understood.  Full responsibility of the confidentiality of this discharge information lies with you and/or your care-partner.

## 2019-09-26 NOTE — Progress Notes (Signed)
Called to room to assist during endoscopic procedure.  Patient ID and intended procedure confirmed with present staff. Received instructions for my participation in the procedure from the performing physician.  

## 2019-09-26 NOTE — Progress Notes (Signed)
PT taken to PACU. Monitors in place. VSS. Report given to RN. 

## 2019-09-26 NOTE — Progress Notes (Signed)
Pt's states no medical or surgical changes since previsit or office visit.  [place]

## 2019-09-28 ENCOUNTER — Inpatient Hospital Stay: Payer: BC Managed Care – PPO | Attending: Nurse Practitioner

## 2019-09-28 ENCOUNTER — Other Ambulatory Visit: Payer: Self-pay

## 2019-09-28 ENCOUNTER — Telehealth: Payer: Self-pay

## 2019-09-28 VITALS — BP 143/70 | HR 84 | Temp 98.8°F | Resp 16

## 2019-09-28 DIAGNOSIS — D509 Iron deficiency anemia, unspecified: Secondary | ICD-10-CM

## 2019-09-28 DIAGNOSIS — Z79899 Other long term (current) drug therapy: Secondary | ICD-10-CM | POA: Diagnosis not present

## 2019-09-28 MED ORDER — SODIUM CHLORIDE 0.9 % IV SOLN
200.0000 mg | Freq: Once | INTRAVENOUS | Status: AC
  Administered 2019-09-28: 15:00:00 200 mg via INTRAVENOUS
  Filled 2019-09-28: qty 10

## 2019-09-28 MED ORDER — SODIUM CHLORIDE 0.9 % IV SOLN
Freq: Once | INTRAVENOUS | Status: AC
  Administered 2019-09-28: 15:00:00 via INTRAVENOUS
  Filled 2019-09-28: qty 250

## 2019-09-28 NOTE — Telephone Encounter (Signed)
LVM

## 2019-09-28 NOTE — Progress Notes (Signed)
Patient refused to stay 30 min post observation Venefor. Patient has no complaints. Vitals are stable.

## 2019-09-28 NOTE — Telephone Encounter (Signed)
  Follow up Call-  Call back number 09/26/2019  Post procedure Call Back phone  # [phone]  Permission to leave phone message Yes  Some recent data might be hidden     Patient questions:  Do you have a fever, pain , or abdominal swelling? No. Pain Score  0 *  Have you tolerated food without any problems? Yes.    Have you been able to return to your normal activities? Yes.    Do you have any questions about your discharge instructions: Diet   No. Medications  No. Follow up visit  No.  Do you have questions or concerns about your Care? No.  Actions: * If pain score is 4 or above: No action needed, pain <4.  1. Have you developed a fever since your procedure? no  2.   Have you had an respiratory symptoms (SOB or cough) since your procedure? no  3.   Have you tested positive for COVID 19 since your procedure no  4.   Have you had any family members/close contacts diagnosed with the COVID 19 since your procedure?  no   If yes to any of these questions please route to Joylene John, RN and Alphonsa Gin, Therapist, sports.

## 2019-09-28 NOTE — Patient Instructions (Signed)

## 2019-09-30 ENCOUNTER — Encounter: Payer: Self-pay | Admitting: Internal Medicine

## 2019-10-05 ENCOUNTER — Inpatient Hospital Stay: Payer: BC Managed Care – PPO

## 2019-10-05 ENCOUNTER — Other Ambulatory Visit: Payer: Self-pay

## 2019-10-05 VITALS — BP 158/79 | HR 83 | Temp 98.1°F | Resp 16

## 2019-10-05 DIAGNOSIS — D509 Iron deficiency anemia, unspecified: Secondary | ICD-10-CM

## 2019-10-05 MED ORDER — SODIUM CHLORIDE 0.9 % IV SOLN
200.0000 mg | Freq: Once | INTRAVENOUS | Status: AC
  Administered 2019-10-05: 200 mg via INTRAVENOUS
  Filled 2019-10-05: qty 10

## 2019-10-05 MED ORDER — SODIUM CHLORIDE 0.9 % IV SOLN
Freq: Once | INTRAVENOUS | Status: AC
  Administered 2019-10-05: 15:00:00 via INTRAVENOUS
  Filled 2019-10-05: qty 250

## 2019-10-05 NOTE — Patient Instructions (Signed)

## 2019-10-05 NOTE — Progress Notes (Signed)
Pt refused to stay for 30 minute post-observation. VSS at d/c.

## 2019-10-12 ENCOUNTER — Other Ambulatory Visit: Payer: Self-pay

## 2019-10-12 ENCOUNTER — Inpatient Hospital Stay: Payer: BC Managed Care – PPO

## 2019-10-12 VITALS — BP 166/66 | HR 84 | Temp 97.8°F | Resp 18

## 2019-10-12 DIAGNOSIS — D509 Iron deficiency anemia, unspecified: Secondary | ICD-10-CM | POA: Diagnosis not present

## 2019-10-12 MED ORDER — SODIUM CHLORIDE 0.9 % IV SOLN
INTRAVENOUS | Status: DC
  Administered 2019-10-12: 15:00:00 via INTRAVENOUS
  Filled 2019-10-12: qty 250

## 2019-10-12 MED ORDER — SODIUM CHLORIDE 0.9 % IV SOLN
200.0000 mg | Freq: Once | INTRAVENOUS | Status: AC
  Administered 2019-10-12: 200 mg via INTRAVENOUS
  Filled 2019-10-12: qty 10

## 2019-10-12 NOTE — Progress Notes (Signed)
Patient declined to remain for 30 minute post Venofer observation period. Vital signs taken prior to patient leaving.

## 2019-10-12 NOTE — Patient Instructions (Signed)

## 2019-10-19 ENCOUNTER — Inpatient Hospital Stay: Payer: BC Managed Care – PPO

## 2019-10-19 ENCOUNTER — Other Ambulatory Visit: Payer: Self-pay

## 2019-10-19 VITALS — BP 172/71 | HR 99 | Temp 98.2°F

## 2019-10-19 DIAGNOSIS — D509 Iron deficiency anemia, unspecified: Secondary | ICD-10-CM | POA: Diagnosis not present

## 2019-10-19 MED ORDER — SODIUM CHLORIDE 0.9 % IV SOLN
200.0000 mg | Freq: Once | INTRAVENOUS | Status: AC
  Administered 2019-10-19: 200 mg via INTRAVENOUS
  Filled 2019-10-19: qty 10

## 2019-10-19 MED ORDER — SODIUM CHLORIDE 0.9 % IV SOLN
INTRAVENOUS | Status: DC
  Administered 2019-10-19: 15:00:00 via INTRAVENOUS
  Filled 2019-10-19: qty 250

## 2019-10-19 NOTE — Patient Instructions (Signed)

## 2019-10-19 NOTE — Progress Notes (Deleted)
Pt got up to BR, was almost at the end of her taxol infusion, tubing was attached to her IV with a phaseal.  The injector on the phaseal came loose while she was in the bathroom, pt had blood & ? Taxol leaking.  Taxol bag was empty upon pt's return to her chair, IV remains intact with saline infusing.  Shelia Media PA examined pt's skin & hand, RN instructed to continue with carbo infusion.

## 2019-10-19 NOTE — Progress Notes (Signed)
Pt declined to stay for 30 minute post venofer observation.

## 2019-11-09 ENCOUNTER — Ambulatory Visit: Payer: BC Managed Care – PPO | Admitting: Internal Medicine

## 2019-11-10 ENCOUNTER — Other Ambulatory Visit: Payer: Self-pay | Admitting: Internal Medicine

## 2019-12-12 ENCOUNTER — Ambulatory Visit: Payer: BC Managed Care – PPO | Admitting: Internal Medicine

## 2019-12-14 ENCOUNTER — Inpatient Hospital Stay: Payer: BC Managed Care – PPO | Attending: Nurse Practitioner | Admitting: Oncology

## 2019-12-14 ENCOUNTER — Ambulatory Visit (INDEPENDENT_AMBULATORY_CARE_PROVIDER_SITE_OTHER): Payer: BC Managed Care – PPO | Admitting: Internal Medicine

## 2019-12-14 ENCOUNTER — Encounter: Payer: Self-pay | Admitting: Internal Medicine

## 2019-12-14 ENCOUNTER — Ambulatory Visit: Payer: BC Managed Care – PPO | Admitting: Internal Medicine

## 2019-12-14 ENCOUNTER — Other Ambulatory Visit: Payer: Self-pay

## 2019-12-14 ENCOUNTER — Inpatient Hospital Stay: Payer: BC Managed Care – PPO

## 2019-12-14 VITALS — BP 150/78 | HR 98 | Ht 68.0 in | Wt 221.0 lb

## 2019-12-14 VITALS — BP 168/79 | HR 93 | Temp 98.0°F | Resp 16 | Ht 67.0 in | Wt 220.4 lb

## 2019-12-14 DIAGNOSIS — K219 Gastro-esophageal reflux disease without esophagitis: Secondary | ICD-10-CM

## 2019-12-14 DIAGNOSIS — Z79899 Other long term (current) drug therapy: Secondary | ICD-10-CM | POA: Insufficient documentation

## 2019-12-14 DIAGNOSIS — K222 Esophageal obstruction: Secondary | ICD-10-CM

## 2019-12-14 DIAGNOSIS — R1310 Dysphagia, unspecified: Secondary | ICD-10-CM | POA: Diagnosis not present

## 2019-12-14 DIAGNOSIS — D509 Iron deficiency anemia, unspecified: Secondary | ICD-10-CM

## 2019-12-14 DIAGNOSIS — Z7984 Long term (current) use of oral hypoglycemic drugs: Secondary | ICD-10-CM | POA: Insufficient documentation

## 2019-12-14 DIAGNOSIS — D123 Benign neoplasm of transverse colon: Secondary | ICD-10-CM

## 2019-12-14 LAB — IRON AND TIBC
Iron: 70 ug/dL (ref 41–142)
Saturation Ratios: 21 % (ref 21–57)
TIBC: 330 ug/dL (ref 236–444)
UIBC: 260 ug/dL (ref 120–384)

## 2019-12-14 LAB — CBC WITH DIFFERENTIAL (CANCER CENTER ONLY)
Abs Immature Granulocytes: 0.04 10*3/uL (ref 0.00–0.07)
Basophils Absolute: 0.1 10*3/uL (ref 0.0–0.1)
Basophils Relative: 2 %
Eosinophils Absolute: 0.2 10*3/uL (ref 0.0–0.5)
Eosinophils Relative: 3 %
HCT: 40.6 % (ref 36.0–46.0)
Hemoglobin: 13.2 g/dL (ref 12.0–15.0)
Immature Granulocytes: 1 %
Lymphocytes Relative: 24 %
Lymphs Abs: 1.4 10*3/uL (ref 0.7–4.0)
MCH: 29.3 pg (ref 26.0–34.0)
MCHC: 32.5 g/dL (ref 30.0–36.0)
MCV: 90 fL (ref 80.0–100.0)
Monocytes Absolute: 0.3 10*3/uL (ref 0.1–1.0)
Monocytes Relative: 4 %
Neutro Abs: 3.9 10*3/uL (ref 1.7–7.7)
Neutrophils Relative %: 66 %
Platelet Count: 141 10*3/uL — ABNORMAL LOW (ref 150–400)
RBC: 4.51 MIL/uL (ref 3.87–5.11)
RDW: 13.9 % (ref 11.5–15.5)
WBC Count: 5.9 10*3/uL (ref 4.0–10.5)
nRBC: 0 % (ref 0.0–0.2)

## 2019-12-14 LAB — FERRITIN: Ferritin: 81 ng/mL (ref 11–307)

## 2019-12-14 NOTE — Patient Instructions (Signed)
Please follow up in one year 

## 2019-12-14 NOTE — Progress Notes (Signed)
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  See that dictation for details.  She subsequently underwent colonoscopy and upper endoscopy September 26, 2019.  Colonoscopy revealed multiple adenomatous polyps all less than 1 cm.  The exam was otherwise normal.  Follow-up in 3 years recommended.  Upper endoscopy revealed a distal esophageal stricture which was dilated with 54 French Maloney dilator.  Duodenal biopsies were negative for sprue.  She was continued on pantoprazole 40 mg daily, advised with regards to reflux precautions, and asked to follow-up at this time.  Patient is pleased to report that her dysphagia is essentially resolved.  For the most part reflux symptoms are controlled though she will have nocturnal regurgitation with pyrosis.  Not following reflux precautions strictly.  No new complaints.  She did see her hematologist earlier today.  She has been receiving iron infusion therapy as she has not responded to oral iron supplementation.  Her blood work, Haematologist, has normalized.  From this morning hemoglobin 13.2, MCV 90, iron saturation 21%, ferritin level 81.  REVIEW OF SYSTEMS:  All non-GI ROS negative unless otherwise stated in the HPI except for headaches, hearing problems, depression, anxiety, fatigue  Past Medical History:  Diagnosis Date  . Anemia   . Anxiety   . 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 Depression   . Diabetes mellitus    type II  . 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 Hydradenitis    right    Past Surgical History:  Procedure Laterality Date  . CESAREAN SECTION     x 2  . CHOLECYSTECTOMY  2006    Social History DONNAMARIA KOFFLER  reports that she has been smoking. She has a 7.50  pack-year smoking history. She has never used smokeless tobacco. She reports that she does not drink alcohol or use drugs.  family history includes Colon cancer in her paternal aunt; Colon polyps in her maternal grandmother; Diabetes in her mother; Hypertension in her mother and another family member; Inflammatory bowel disease in her maternal aunt.  No Known Allergies     PHYSICAL EXAMINATION:  Vital signs: BP (!) 150/78   Pulse 98   Ht 5\' 8"  (1.727 m)   Wt 221 lb (100.2 kg)   BMI 33.60 kg/m   Constitutional: generally well-appearing, no acute distress Psychiatric: alert and oriented x3, cooperative Eyes: extraocular movements intact, anicteric, conjunctiva pink Mouth: Mask Abdomen: Omitted Rectal: Not repeated Skin: no lesions on visible extremities Neuro: No focal deficits apparent  ASSESSMENT:  1.  Chronic GERD.  Occasional breakthrough symptoms on pantoprazole 40 mg daily 2.  Esophageal stricture with resultant dysphagia.  Improved post dilation 3.  Obesity 4.  Iron deficiency anemia.  Improved after iron infusion therapy.  Under the watchful eye of hematology 5.  Multiple adenomatous colon polyps on recent colonoscopy   PLAN:  1.  Reflux precautions.  We discussed weight loss, avoidance of large meals and late night meals, as well as elevation of head of bed as principal measures. 2.  Continue pantoprazole 40 mg daily.  We discussed the prospects of increasing the medication to twice daily to address breakthrough symptoms, though she would rather work on reflux precautions aggressively.  I agree.  Medication risks reviewed 3.  Continue to monitor blood counts and iron levels with your hematologist 4.  Surveillance colonoscopy around October 2023 5.  Routine GI office follow-up 1 year.  Contact the office in the interim for any questions or problems

## 2019-12-14 NOTE — Progress Notes (Signed)
Hematology and Oncology Follow Up Visit  [name] [name] [id number] 09-Sep-1966 53 y.o. 12/14/2019 8:25 AM [name], [name], MDPlotnikov, [name], MD   Principle Diagnosis: 53 year old woman with iron deficiency anemia noted in 2014.  She was found to have iron level of 25 and ferritin of 5 at the time of diagnosis.  This is related to chronic menstrual losses.   Prior Therapy: She is status post IV iron in the form of Feraheme previously.  Last treatment received a event of her in October 2020.  Current therapy: Repeat IV iron as needed..  Interim History: [name] returns today for repeat evaluation.  Since her last visit, she received vera for intravenous iron infusion without any major complications.  She did report improvement in her overall energy and performance status.  She denies any hematochezia, melena or epistaxis.  She is no longer having any menstrual cycles.  Patient denied any alteration mental status, neuropathy, confusion or dizziness.  Denies any headaches or lethargy.  Denies any night sweats, weight loss or changes in appetite.  Denied orthopnea, dyspnea on exertion or chest discomfort.  Denies shortness of breath, difficulty breathing hemoptysis or cough.  Denies any abdominal distention, nausea, early satiety or dyspepsia.  Denies any hematuria, frequency, dysuria or nocturia.  Denies any skin irritation, dryness or rash.  Denies any ecchymosis or petechiae.  Denies any lymphadenopathy or clotting.  Denies any heat or cold intolerance.  Denies any anxiety or depression.  Remaining review of system is negative.     Medications: Updated without any changes. Current Outpatient Medications  Medication Sig Dispense Refill  . ALPRAZolam (XANAX) 0.5 MG tablet Take 1 tablet (0.5 mg total) by mouth 2 (two) times daily. 60 tablet 2  . aspirin 81 MG chewable tablet Chew 1 tablet (81 mg total) by mouth daily. 90 tablet 1  . atorvastatin (LIPITOR) 10 MG tablet TAKE 1  TABLET(10 MG) BY MOUTH DAILY 90 tablet 3  . buPROPion (WELLBUTRIN SR) 150 MG 12 hr tablet TAKE 1 TABLET(150 MG) BY MOUTH TWICE DAILY 180 tablet 3  . Cholecalciferol (EQL VITAMIN D3) 1000 units tablet Take 1 tablet (1,000 Units total) by mouth daily. 90 tablet 1  . citalopram (CELEXA) 20 MG tablet TAKE 1 TABLET(20 MG) BY MOUTH DAILY 90 tablet 3  . fenofibrate (TRICOR) 145 MG tablet TAKE 1 TABLET(145 MG) BY MOUTH DAILY 90 tablet 3  . ferrous sulfate 325 (65 FE) MG tablet Take 1 tablet (325 mg total) by mouth daily. 90 tablet 1  . JARDIANCE 25 MG TABS tablet TAKE 1 TABLET BY MOUTH DAILY 90 tablet 3  . metFORMIN (GLUCOPHAGE) 500 MG tablet Take 2 tablets (1,000 mg total) by mouth 2 (two) times daily with a meal. Patient needs office visit before refills will be given 360 tablet 3  . pantoprazole (PROTONIX) 40 MG tablet Take 1 tablet (40 mg total) by mouth 2 (two) times daily before a meal. 180 tablet 3  . repaglinide (PRANDIN) 2 MG tablet TAKE 1 TABLET(2 MG) BY MOUTH THREE TIMES DAILY BEFORE MEALS 270 tablet 3  . triamcinolone ointment (KENALOG) 0.1 % Apply 1 application topically 3 (three) times daily. 30 g 1   No current facility-administered medications for this visit.     Allergies: No Known Allergies  Past Medical History, Surgical history, Social history, and Family History reviewed without any changes.   Physical Exam: Blood pressure (!) 168/79, pulse 93, temperature 98 F (36.7 C), temperature source Temporal, resp. rate 16, height  5\' 7"  (1.702 m), weight 220 lb 6.4 oz (100 kg), SpO2 98 %.   ECOG: 0    General appearance: Comfortable appearing without any discomfort Head: Normocephalic without any trauma Oropharynx: Mucous membranes are moist and pink without any thrush or ulcers. Eyes: Pupils are equal and round reactive to light. Lymph nodes: No cervical, supraclavicular, inguinal or axillary lymphadenopathy.   Heart:regular rate and rhythm.  S1 and S2 without leg  edema. Lung: Clear without any rhonchi or wheezes.  No dullness to percussion. Abdomin: Soft, nontender, nondistended with good bowel sounds.  No hepatosplenomegaly. Musculoskeletal: No joint deformity or effusion.  Full range of motion noted. Neurological: No deficits noted on motor, sensory and deep tendon reflex exam. Skin: No petechial rash or dryness.  Appeared moist.     Lab Results: Lab Results  Component Value Date   WBC 7.2 09/14/2019   HGB 9.8 (L) 09/14/2019   HCT 35.0 (L) 09/14/2019   MCV 78.8 (L) 09/14/2019   PLT 239 09/14/2019     Chemistry      Component Value Date/Time   NA 136 08/11/2019 0742   NA 135 (L) 12/06/2015 1316   K 4.1 08/11/2019 0742   K 3.7 12/06/2015 1316   CL 102 08/11/2019 0742   CO2 22 08/11/2019 0742   CO2 21 (L) 12/06/2015 1316   BUN 15 08/11/2019 0742   BUN 7.2 12/06/2015 1316   CREATININE 0.78 08/11/2019 0742   CREATININE 0.7 12/06/2015 1316      Component Value Date/Time   CALCIUM 9.4 08/11/2019 0742   CALCIUM 9.1 12/06/2015 1316   ALKPHOS 60 08/11/2019 0742   ALKPHOS 99 12/06/2015 1316   AST 42 (H) 08/11/2019 0742   AST 17 12/06/2015 1316   ALT 43 (H) 08/11/2019 0742   ALT 21 12/06/2015 1316   BILITOT 0.7 08/11/2019 0742   BILITOT 0.63 12/06/2015 1316      Results for [name] [name], [name] [name] (MRN [id number]) as of 12/14/2019 08:27  Ref. Range 09/14/2019 07:52  Iron Latest Ref Range: 41 - 142 ug/dL 37 (L)  UIBC Latest Ref Range: 120 - 384 ug/dL 416 (H)  TIBC Latest Ref Range: 236 - 444 ug/dL 454 (H)  Saturation Ratios Latest Ref Range: 21 - 57 % 8 (L)  Ferritin Latest Ref Range: 11 - 307 ng/mL 14     Impression and Plan:  53 year old woman with:   1.  Iron deficiency anemia diagnosed in 2016.  This is related to chronic menstrual blood losses with intermittent IV iron replacement.   Iron studies obtained in August and September 2020 showed worsening deficiency and received Venofer in October 2020.  Hemoglobin from today is  back to normal with improvement in her MCV with iron studies are currently pending.  Risks and benefits of continued active surveillance and repeat intravenous iron was reviewed.  Potential complications that include arthralgias, myalgias and infusion related complications and rarely anaphylaxis were reiterated.  For the time being we will continue with active surveillance and repeat iron intravenously as needed.  2.  Age-appropriate cancer screening: She is up-to-date and receive colonoscopy.  3. Follow-up: In 4 months.   15  minutes was spent with the patient face-to-face today.  More than 50% of time was spent on reviewing laboratory data, treatment options and complications related to therapy.    Zola Button, MD 12/23/20208:25 AM

## 2019-12-15 ENCOUNTER — Telehealth: Payer: Self-pay | Admitting: Oncology

## 2019-12-15 NOTE — Telephone Encounter (Signed)
Scheduled appt per 12/23 los.  Sent a message to HIM pool to get a calendar mailed out. 

## 2019-12-19 ENCOUNTER — Telehealth: Payer: Self-pay

## 2019-12-19 NOTE — Telephone Encounter (Signed)
-----   Message from [name], MD sent at 12/19/2019  8:05 AM EST ----- Please let her know iron studies are normal.

## 2019-12-19 NOTE — Telephone Encounter (Signed)
Called and informed patient of information below. Patient verbalized understanding.

## 2020-03-11 ENCOUNTER — Other Ambulatory Visit: Payer: Self-pay | Admitting: Internal Medicine

## 2020-03-11 DIAGNOSIS — D508 Other iron deficiency anemias: Secondary | ICD-10-CM

## 2020-03-16 ENCOUNTER — Other Ambulatory Visit: Payer: Self-pay | Admitting: Internal Medicine

## 2020-03-26 ENCOUNTER — Ambulatory Visit (INDEPENDENT_AMBULATORY_CARE_PROVIDER_SITE_OTHER): Payer: BC Managed Care – PPO

## 2020-03-26 ENCOUNTER — Ambulatory Visit: Payer: Self-pay

## 2020-03-26 ENCOUNTER — Ambulatory Visit (INDEPENDENT_AMBULATORY_CARE_PROVIDER_SITE_OTHER): Payer: BC Managed Care – PPO | Admitting: Family Medicine

## 2020-03-26 ENCOUNTER — Other Ambulatory Visit: Payer: Self-pay

## 2020-03-26 DIAGNOSIS — M79642 Pain in left hand: Secondary | ICD-10-CM

## 2020-03-26 NOTE — Progress Notes (Signed)
Rito Ehrlich, am serving as a Education administrator for Dr. Lynne Leader.  [name] [name] is a 54 y.o. female who presents to [facility] today for L thumb pain. Patient last saw Dr. Tamala Julian 11/24/2018 for neck pain.  Pain is located at the base of the thumb and interdigital webspace between the thumb and index finger on her left hand.  Symptoms present now for a few months.  She notes symptoms typically occur with activity including pinching and grasping.  She notes this causes intermittent sharp pain.  She denies significant weakness or numbness or loss of dysfunction.  She denies significant neck pain or radiating pain.  She is not tried much treatment yet.      Pertinent review of systems: No fevers or chills  Relevant historical information: History heart failure due to preeclampsia.  History diabetes   Exam:   General: Well Developed, well nourished, and in no acute distress.   MSK: C-spine: Normal-appearing nontender decreased cervical motion negative Spurling's test. Left arm normal-appearing Left hand normal-appearing with no significant atrophy. Mildly tender palpation first [place].  Nontender interdigital webspace or elsewhere into the hand or wrist. Normal hand and wrist motion. Normal strength. Pulses cap refill and sensation are intact distally. Negative Tinel's and Phalen's test.    Lab and Radiology Results  Diagnostic Limited MSK Ultrasound of: Left hand Degenerative appearance with mild effusion of first [place].  Hand is otherwise normal-appearing with no significant abnormality of thenar musculature or interdigital webspace musculature. Normal bony structures otherwise Impression: First CMC DJD  X-ray images left hand obtained today personally and independently reviewed Mild first CMC DJD normal otherwise Await formal radiology review   Assessment and Plan: 54 y.o. female with 4 months of intermittent left hand pain at the base of the thumb  and interdigital webspace between the thumb and index finger.  Most dominant finding on today's exam as DJD first [place].  Discussed options.  Plan for Voltaren gel and referral to occupational therapy.  If not improved will return to clinic.  Likely neck step will be injection if needed.   PDMP not reviewed this encounter. Orders Placed This Encounter  Procedures  . [place] LIMITED JOINT SPACE STRUCTURES UP LEFT(NO LINKED CHARGES)    Order Specific Question:   Reason for Exam (SYMPTOM  OR DIAGNOSIS REQUIRED)    Answer:   eval L hand pain    Order Specific Question:   Preferred imaging location?    Answer:   [place]  . DG Hand Complete Left    Standing Status:   Future    Standing Expiration Date:   05/26/2021    Order Specific Question:   Reason for Exam (SYMPTOM  OR DIAGNOSIS REQUIRED)    Answer:   pain base of thumb and interweb space 1-2    Order Specific Question:   Is patient pregnant?    Answer:   No    Order Specific Question:   Preferred imaging location?    Answer:   [facility]    Order Specific Question:   Radiology Contrast Protocol - do NOT remove file path    Answer:   [email or web address]  . Ambulatory referral to Occupational Therapy    Referral Priority:   Routine    Referral Type:   Occupational Therapy    Referral Reason:   Specialty Services Required    Requested Specialty:   Occupational Therapy    Number of Visits Requested:  1   No orders of the defined types were placed in this encounter.    Discussed warning signs or symptoms. Please see discharge instructions. Patient expresses understanding.   The above documentation has been reviewed and is accurate and complete Lynne Leader

## 2020-03-26 NOTE — Patient Instructions (Signed)
Thank you for coming in today. Use voltaren gel over the counter up to 4x daily or use the left over pennsiad 2x daily.  Ok to use heat.  Attend Occupational Therapy.  Recheck with me or Dr Tamala Julian in the future if not improving . OT lasts 4-6 weeks.   Next step is likley injection.   Get xray today on your way out.

## 2020-03-27 NOTE — Progress Notes (Signed)
Hand x-ray is largely normal-appearing according to radiology

## 2020-04-02 ENCOUNTER — Other Ambulatory Visit: Payer: Self-pay | Admitting: Internal Medicine

## 2020-04-13 ENCOUNTER — Inpatient Hospital Stay: Payer: BC Managed Care – PPO

## 2020-04-13 ENCOUNTER — Inpatient Hospital Stay: Payer: BC Managed Care – PPO | Admitting: Oncology

## 2020-04-27 ENCOUNTER — Other Ambulatory Visit: Payer: Self-pay

## 2020-04-27 ENCOUNTER — Ambulatory Visit (HOSPITAL_COMMUNITY)
Admission: EM | Admit: 2020-04-27 | Discharge: 2020-04-27 | Disposition: A | Discharge status: Home / Self Care | Payer: No Typology Code available for payment source | Attending: Physician Assistant | Admitting: Physician Assistant

## 2020-04-27 ENCOUNTER — Encounter (HOSPITAL_COMMUNITY): Payer: Self-pay

## 2020-04-27 DIAGNOSIS — M25462 Effusion, left knee: Secondary | ICD-10-CM | POA: Diagnosis not present

## 2020-04-27 DIAGNOSIS — M25562 Pain in left knee: Secondary | ICD-10-CM | POA: Diagnosis not present

## 2020-04-27 MED ORDER — ACETAMINOPHEN 325 MG PO TABS: 650.0000 mg | ORAL_TABLET | Freq: Four times a day (QID) | ORAL | 0 refills | Status: DC | PRN

## 2020-04-27 MED ORDER — DICLOFENAC SODIUM 1 % EX GEL: 4.0000 g | Freq: Four times a day (QID) | CUTANEOUS | 0 refills | Status: DC

## 2020-04-27 NOTE — ED Triage Notes (Signed)
Pt presents to [facility] with left knee pain after she fell over her left side at work and injured her left knee 2 weeks ago. Pt states struggling walking, standing up and the pain is progressively worst. Ibuprofen 600-800 daily without relief.

## 2020-04-27 NOTE — ED Provider Notes (Signed)
[facility]    CSN: [id number] Arrival date & time: 04/27/20  1702      History   Chief Complaint Chief Complaint  Patient presents with  . Knee Injury    HPI [name] [name] is a 54 y.o. female.   Patient presents for evaluation of left knee pain.  She reports she injured the knee at work 2 weeks ago.  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  She does not remember if she fell directly onto the left knee, does not remember feeling a pop.  She reports having soreness in the knee develop over the next few days following it.  She reports being able to bear weight and walking without much issue other than soreness for the first week or so.  She reports she had some swelling and bruising at the knee that resolved over time.  She reports however over the last week she has had some more pain develop as she describes" behind the kneecap" when standing and walking.  She reports the pain is worse if she has been seated for a while and then stands and with slow lowering to a chair.  She reports the pain is up some if she is up and moving around for longer periods of time.  She also feels the knee might give out occasionally.  She reports she is taken over-the-counter medicines and try to ice and elevation.     Past Medical History:  Diagnosis Date  . Anemia   . Anxiety   . 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 Depression   . Diabetes mellitus    type II  . 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 Hydradenitis    right    Patient Active Problem List   Diagnosis Date Noted  . Dysphagia 08/09/2019  . Cervical radiculopathy at C7 10/27/2018  . Rotator cuff tear, non-traumatic, left 12/28/2017  . RTI (respiratory tract infection) 11/26/2017  . Shoulder pain, left 10/21/2017  . Obesity (BMI 35.0-39.9 without comorbidity)  07/07/2017  . Fatigue 07/07/2017  . Hydradenitis 03/20/2016  . Eczema 03/20/2016  . Anemia 05/30/2015  . Family history of colon cancer 02/28/2014  . Right leg pain 02/28/2014  . Right elbow pain 02/28/2014  . Sinusitis, acute 11/09/2013  . Asthmatic bronchitis 11/09/2013  . Well adult exam 05/26/2012  . Microcytic anemia 05/26/2012  . Bronchitis, acute 10/02/2011  . Migraine 09/23/2011  . Tachycardia 01/21/2011  . DYSPNEA 01/12/2009  . TOBACCO USE DISORDER/SMOKER-SMOKING CESSATION DISCUSSED 04/13/2008  . Diabetes type 2, controlled ([place]) 01/11/2008  . Anxiety state 01/11/2008  . Adjustment disorder with mixed anxiety and depressed mood 01/11/2008  . POLYCYSTIC OVARIAN DISEASE 09/24/2007  . Congestive heart failure ([place]) 09/24/2007  . LIVER FUNCTION TESTS, ABNORMAL 09/24/2007    Past Surgical History:  Procedure Laterality Date  . CESAREAN SECTION     x 2  . CHOLECYSTECTOMY  2006    OB History   No obstetric history on file.      Home Medications    Prior to Admission medications   Medication Sig Start Date End Date Taking? Authorizing Provider  acetaminophen (TYLENOL) 325 MG tablet Take 2 tablets (650 mg total) by mouth every 6 (six) hours as needed. 04/27/20   Romeo Zielinski, Marguerita Beards, PA-C  ALPRAZolam Duanne Moron) 0.5  MG tablet TAKE 1 TABLET(0.5 MG) BY MOUTH TWICE DAILY 03/11/20   [name], [name], MD  aspirin 81 MG chewable tablet Chew 1 tablet (81 mg total) by mouth daily. 05/19/17   [name], [name], MD  atorvastatin (LIPITOR) 10 MG tablet Take 1 tablet (10 mg total) by mouth daily at 6 PM. Office visit needed before refills will be given 03/16/20   [name], [name], MD  buPROPion (WELLBUTRIN SR) 150 MG 12 hr tablet TAKE 1 TABLET(150 MG) BY MOUTH TWICE DAILY 03/11/20   [name], [name], MD  Cholecalciferol (EQL VITAMIN D3) 1000 units tablet Take 1 tablet (1,000 Units total) by mouth daily. 05/19/17   [name], [name], MD  citalopram (CELEXA) 20 MG tablet TAKE 1  TABLET(20 MG) BY MOUTH DAILY 03/11/20   [name], [name], MD  diclofenac Sodium (VOLTAREN) 1 % GEL Apply 4 g topically 4 (four) times daily. 04/27/20   Skyeler Smola, Marguerita Beards, PA-C  empagliflozin (JARDIANCE) 25 MG TABS tablet Take 25 mg by mouth daily. Annual appt due in August must see provider for future refills 04/03/20   [name], [name], MD  fenofibrate (TRICOR) 145 MG tablet TAKE 1 TABLET(145 MG) BY MOUTH DAILY 11/10/19   [name], [name], MD  ferrous sulfate 325 (65 FE) MG tablet Take 1 tablet (325 mg total) by mouth daily. 08/13/19 08/12/20  [name], [name], MD  metFORMIN (GLUCOPHAGE) 500 MG tablet Take 2 tablets (1,000 mg total) by mouth 2 (two) times daily with a meal. Patient needs office visit before refills will be given 08/09/19   [name], [name], MD  pantoprazole (PROTONIX) 40 MG tablet Take 1 tablet (40 mg total) by mouth 2 (two) times daily before a meal. 08/09/19   [name], [name], MD  repaglinide (PRANDIN) 2 MG tablet Take 1 tablet (2 mg total) by mouth 3 (three) times daily before meals. Office visit needed before refills will be given 03/16/20   [name], [name], MD  triamcinolone ointment (KENALOG) 0.1 % APPLY EXTERNALLY TO THE AFFECTED AREA THREE TIMES DAILY 03/11/20   [name], [name], MD    Family History Family History  Problem Relation Age of Onset  . Diabetes Mother   . Hypertension Mother   . Hypertension Other   . Inflammatory bowel disease Maternal Aunt   . Colon cancer Paternal Aunt   . Colon polyps Maternal Grandmother   . Liver cancer Neg Hx   . Pancreatic cancer Neg Hx   . Rectal cancer Neg Hx   . Stomach cancer Neg Hx   . Esophageal cancer Neg Hx     Social History Social History   Tobacco Use  . Smoking status: Light Tobacco Smoker    Packs/day: 0.50    Years: 15.00    Pack years: 7.50  . Smokeless tobacco: Never Used  . Tobacco comment: info given 12/14/2019  Substance Use Topics  . Alcohol use: No  . Drug use: No      Allergies   Patient has no known allergies.   Review of Systems Review of Systems   Physical Exam Triage Vital Signs ED Triage Vitals  Enc Vitals Group     BP 04/27/20 1751 (!) 158/68     Pulse --      Resp 04/27/20 1751 18     Temp 04/27/20 1751 98.3 F (36.8 C)     Temp Source 04/27/20 1751 Oral     SpO2 04/27/20 1751 98 %     Weight --  Height --      Head Circumference --      Peak Flow --      Pain Score 04/27/20 1748 7     Pain Loc --      Pain Edu? --      Excl. in [place]? --    No data found.  Updated Vital Signs BP (!) 158/68 (BP Location: Right Arm)   Temp 98.3 F (36.8 C) (Oral)   Resp 18   SpO2 98%   Visual Acuity Right Eye Distance:   Left Eye Distance:   Bilateral Distance:    Right Eye Near:   Left Eye Near:    Bilateral Near:     Physical Exam Vitals and nursing note reviewed.  Constitutional:      General: She is not in acute distress.    Appearance: Normal appearance. She is not ill-appearing.  Cardiovascular:     Rate and Rhythm: Normal rate.  Pulmonary:     Effort: Pulmonary effort is normal. No respiratory distress.  Musculoskeletal:     Right lower leg: No edema.     Comments: Patient is ambulatory.  She does report some pain with walking however has a fluid gait without significant limp.  Left knee does have some resolving ecchymosis over the anterior knee.  There is a minimal to mild effusion.  There are some tenderness to palpation over the anterior knee.  No medial or lateral joint line tenderness.  No significant pain elicited with patellar compression.  There is some pain elicited with terminal flexion of the knee.  Joint stable to anterior and posterior drawer test.  No varus or valgus laxity.  No crepitus appreciated on range of motion and with McMurray's.  There are several varicosities of the superficial veins  Skin:    General: Skin is warm and dry.  Neurological:     General: No focal deficit present.      Mental Status: She is alert and oriented to person, place, and time.      UC Treatments / Results  Labs (all labs ordered are listed, but only abnormal results are displayed) Labs Reviewed - No data to display  EKG   Radiology No results found.  Procedures Procedures (including critical care time)  Medications Ordered in UC Medications - No data to display  Initial Impression / Assessment and Plan / UC Course  I have reviewed the triage vital signs and the nursing notes.  Pertinent labs & imaging results that were available during my care of the patient were reviewed by me and considered in my medical decision making (see chart for details).     #Acute left knee pain #Knee effusion Patient is a 54 year old presenting with acute left knee pain with knee effusion.  Given she is ambulatory and history doubt fracture.  Will defer x-ray.  There is possible soft tissue injury however joint is grossly stable at this time.  Some suspicion for development of patellofemoral syndrome.  Will place in a knee sleeve and recommend Voltaren gel.  Recommend follow-up with primary care or sports medicine clinic for further evaluation.  Patient verbalized understanding.  Final Clinical Impressions(s) / UC Diagnoses   Final diagnoses:  Acute pain of left knee  Effusion of left knee     Discharge Instructions     Add tylenol to your current pain management. Try the voltaren gel on the knee 4 times a day Wear the sleeve/brace   Follow up with your primary care  or the sports medicine group next week for evaluation of progress.      ED Prescriptions    Medication Sig Dispense Auth. Provider   acetaminophen (TYLENOL) 325 MG tablet Take 2 tablets (650 mg total) by mouth every 6 (six) hours as needed. 30 tablet Yissel Habermehl, Marguerita Beards, PA-C   diclofenac Sodium (VOLTAREN) 1 % GEL Apply 4 g topically 4 (four) times daily. 100 g Lyncoln Ledgerwood, Marguerita Beards, PA-C     PDMP not reviewed this encounter.   Purnell Shoemaker, PA-C 04/27/20 2129

## 2020-04-27 NOTE — Discharge Instructions (Addendum)
Add tylenol to your current pain management. Try the voltaren gel on the knee 4 times a day Wear the sleeve/brace   Follow up with your primary care or the sports medicine group next week for evaluation of progress.

## 2020-06-12 ENCOUNTER — Other Ambulatory Visit: Payer: Self-pay | Admitting: Internal Medicine

## 2020-06-12 ENCOUNTER — Inpatient Hospital Stay: Payer: BC Managed Care – PPO

## 2020-06-12 ENCOUNTER — Inpatient Hospital Stay: Payer: BC Managed Care – PPO | Admitting: Oncology

## 2020-06-20 ENCOUNTER — Inpatient Hospital Stay: Payer: BC Managed Care – PPO | Attending: Oncology

## 2020-06-20 ENCOUNTER — Other Ambulatory Visit: Payer: Self-pay

## 2020-06-20 ENCOUNTER — Inpatient Hospital Stay (HOSPITAL_BASED_OUTPATIENT_CLINIC_OR_DEPARTMENT_OTHER): Payer: BC Managed Care – PPO | Admitting: Oncology

## 2020-06-20 VITALS — BP 147/68 | HR 102 | Temp 97.8°F | Resp 18 | Ht 68.0 in | Wt 221.3 lb

## 2020-06-20 DIAGNOSIS — Z79899 Other long term (current) drug therapy: Secondary | ICD-10-CM | POA: Insufficient documentation

## 2020-06-20 DIAGNOSIS — R5383 Other fatigue: Secondary | ICD-10-CM | POA: Insufficient documentation

## 2020-06-20 DIAGNOSIS — D509 Iron deficiency anemia, unspecified: Secondary | ICD-10-CM | POA: Diagnosis present

## 2020-06-20 DIAGNOSIS — Z7984 Long term (current) use of oral hypoglycemic drugs: Secondary | ICD-10-CM | POA: Insufficient documentation

## 2020-06-20 DIAGNOSIS — N920 Excessive and frequent menstruation with regular cycle: Secondary | ICD-10-CM | POA: Diagnosis not present

## 2020-06-20 DIAGNOSIS — R531 Weakness: Secondary | ICD-10-CM | POA: Insufficient documentation

## 2020-06-20 LAB — CBC WITH DIFFERENTIAL (CANCER CENTER ONLY)
Abs Immature Granulocytes: 0.04 10*3/uL (ref 0.00–0.07)
Basophils Absolute: 0.1 10*3/uL (ref 0.0–0.1)
Basophils Relative: 1 %
Eosinophils Absolute: 0.2 10*3/uL (ref 0.0–0.5)
Eosinophils Relative: 3 %
HCT: 38.3 % (ref 36.0–46.0)
Hemoglobin: 12.6 g/dL (ref 12.0–15.0)
Immature Granulocytes: 1 %
Lymphocytes Relative: 30 %
Lymphs Abs: 2.4 10*3/uL (ref 0.7–4.0)
MCH: 29.6 pg (ref 26.0–34.0)
MCHC: 32.9 g/dL (ref 30.0–36.0)
MCV: 89.9 fL (ref 80.0–100.0)
Monocytes Absolute: 0.4 10*3/uL (ref 0.1–1.0)
Monocytes Relative: 5 %
Neutro Abs: 4.9 10*3/uL (ref 1.7–7.7)
Neutrophils Relative %: 60 %
Platelet Count: 169 10*3/uL (ref 150–400)
RBC: 4.26 MIL/uL (ref 3.87–5.11)
RDW: 13.7 % (ref 11.5–15.5)
WBC Count: 8.1 10*3/uL (ref 4.0–10.5)
nRBC: 0 % (ref 0.0–0.2)

## 2020-06-20 NOTE — Progress Notes (Signed)
Hematology and Oncology Follow Up Visit  [name] [name] [id number] 1966-03-30 54 y.o. 06/20/2020 3:16 PM [name], [name], MDPlotnikov, [name], MD   Principle Diagnosis: 54 year old woman with anemia diagnosed in 2014.  She was found to have iron deficiency related to chronic menstrual blood losses.     Prior Therapy: IV iron infusion as needed.  She received Venofer in October 2020.  Current therapy: Active surveillance and repeat iron infusion as needed.  Interim History: [name] [name] is here for return evaluation.  Since the last visit, she reports no major changes in her health.  She does report some mild fatigue and tiredness but no other complaints.  She denies any hematochezia or melena.  She denies any hemoptysis or hematemesis.  She is currently in menopause and has not had any menstrual cycles.     Medications: Unchanged on review. Current Outpatient Medications  Medication Sig Dispense Refill  . acetaminophen (TYLENOL) 325 MG tablet Take 2 tablets (650 mg total) by mouth every 6 (six) hours as needed. 30 tablet 0  . ALPRAZolam (XANAX) 0.5 MG tablet TAKE 1 TABLET(0.5 MG) BY MOUTH TWICE DAILY 60 tablet 2  . aspirin 81 MG chewable tablet Chew 1 tablet (81 mg total) by mouth daily. 90 tablet 1  . atorvastatin (LIPITOR) 10 MG tablet TAKE 1 TABLET BY MOUTH DAILY AT 6PM 90 tablet 3  . buPROPion (WELLBUTRIN SR) 150 MG 12 hr tablet TAKE 1 TABLET(150 MG) BY MOUTH TWICE DAILY 180 tablet 3  . Cholecalciferol (EQL VITAMIN D3) 1000 units tablet Take 1 tablet (1,000 Units total) by mouth daily. 90 tablet 1  . citalopram (CELEXA) 20 MG tablet TAKE 1 TABLET(20 MG) BY MOUTH DAILY 90 tablet 3  . diclofenac Sodium (VOLTAREN) 1 % GEL Apply 4 g topically 4 (four) times daily. 100 g 0  . empagliflozin (JARDIANCE) 25 MG TABS tablet Take 25 mg by mouth daily. Annual appt due in August must see provider for future refills 90 tablet 0  . fenofibrate (TRICOR) 145 MG tablet TAKE 1 TABLET(145 MG) BY  MOUTH DAILY 90 tablet 3  . ferrous sulfate 325 (65 FE) MG tablet Take 1 tablet (325 mg total) by mouth daily. 90 tablet 1  . metFORMIN (GLUCOPHAGE) 500 MG tablet Take 2 tablets (1,000 mg total) by mouth 2 (two) times daily with a meal. Patient needs office visit before refills will be given 360 tablet 3  . pantoprazole (PROTONIX) 40 MG tablet Take 1 tablet (40 mg total) by mouth 2 (two) times daily before a meal. 180 tablet 3  . repaglinide (PRANDIN) 2 MG tablet TAKE 1 TABLET BY MOUTH THREE TIMES DAILY BEFORE MEALS 270 tablet 3  . triamcinolone ointment (KENALOG) 0.1 % APPLY EXTERNALLY TO THE AFFECTED AREA THREE TIMES DAILY 30 g 1   No current facility-administered medications for this visit.     Allergies: No Known Allergies     Physical Exam: Blood pressure (!) 147/68, pulse (!) 102, temperature 97.8 F (36.6 C), temperature source Temporal, resp. rate 18, height 5\' 8"  (1.727 m), weight 221 lb 4.8 oz (100.4 kg), SpO2 96 %.   ECOG: 0    General appearance: Alert, awake without any distress. Head: Atraumatic without abnormalities Oropharynx: Without any thrush or ulcers. Eyes: No scleral icterus. Lymph nodes: No lymphadenopathy noted in the cervical, supraclavicular, or axillary nodes Heart:regular rate and rhythm, without any murmurs or gallops.   Lung: Clear to auscultation without any rhonchi, wheezes or dullness to percussion. Abdomin: Soft,  nontender without any shifting dullness or ascites. Musculoskeletal: No clubbing or cyanosis. Neurological: No motor or sensory deficits. Skin: No rashes or lesions.      Lab Results: Lab Results  Component Value Date   WBC 5.9 12/14/2019   HGB 13.2 12/14/2019   HCT 40.6 12/14/2019   MCV 90.0 12/14/2019   PLT 141 (L) 12/14/2019     Chemistry      Component Value Date/Time   NA 136 08/11/2019 0742   NA 135 (L) 12/06/2015 1316   K 4.1 08/11/2019 0742   K 3.7 12/06/2015 1316   CL 102 08/11/2019 0742   CO2 22 08/11/2019  0742   CO2 21 (L) 12/06/2015 1316   BUN 15 08/11/2019 0742   BUN 7.2 12/06/2015 1316   CREATININE 0.78 08/11/2019 0742   CREATININE 0.7 12/06/2015 1316      Component Value Date/Time   CALCIUM 9.4 08/11/2019 0742   CALCIUM 9.1 12/06/2015 1316   ALKPHOS 60 08/11/2019 0742   ALKPHOS 99 12/06/2015 1316   AST 42 (H) 08/11/2019 0742   AST 17 12/06/2015 1316   ALT 43 (H) 08/11/2019 0742   ALT 21 12/06/2015 1316   BILITOT 0.7 08/11/2019 0742   BILITOT 0.63 12/06/2015 1316      Results for [name] [name] (MRN [id number]) as of 06/20/2020 14:45  Ref. Range 12/14/2019 08:12  Iron Latest Ref Range: 41 - 142 ug/dL 70  UIBC Latest Ref Range: 120 - 384 ug/dL 260  TIBC Latest Ref Range: 236 - 444 ug/dL 330  Saturation Ratios Latest Ref Range: 21 - 57 % 21  Ferritin Latest Ref Range: 11 - 307 ng/mL 81      Impression and Plan:  54 year old woman with:   1.  Anemia diagnosed in 2014 related to chronic menstrual losses and iron deficiency.  She is status post intravenous iron infusion given in the past with the last infusion given in October 2020.  Iron studies in December at that time showed complete normalization.  Risks and benefits of repeat intravenous iron were reviewed.  Complications include arthralgias, myalgias and infusion related issues were reiterated.  Hemoglobin today reviewed and appears to be within normal range and IV iron infusion will be deferred unless she develops worsening deficiency stricture.  I anticipate improvement in her iron deficiency given lack of menstrual blood losses.  2.  Age-appropriate cancer screening: He is currently up-to-date on his colonoscopy and mammography..  3.  Covid vaccination considerations: She is up-to-date and completed vaccination series.  4. Follow-up: Will be in 8 months for repeat follow-up.  30  minutes were dedicated to this visit. The time was spent on reviewing laboratory data, discussing treatment options,  and answering  questions regarding future plan.     Zola Button, MD 6/30/20213:16 PM

## 2020-06-21 LAB — IRON AND TIBC
Iron: 80 ug/dL (ref 41–142)
Saturation Ratios: 23 % (ref 21–57)
TIBC: 348 ug/dL (ref 236–444)
UIBC: 268 ug/dL (ref 120–384)

## 2020-06-21 LAB — FERRITIN: Ferritin: 53 ng/mL (ref 11–307)

## 2020-06-29 ENCOUNTER — Telehealth: Payer: Self-pay | Admitting: Oncology

## 2020-06-29 NOTE — Telephone Encounter (Signed)
Scheduled per los, patient has been called and notified. 

## 2020-07-04 ENCOUNTER — Other Ambulatory Visit: Payer: Self-pay | Admitting: Internal Medicine

## 2020-07-11 ENCOUNTER — Ambulatory Visit (INDEPENDENT_AMBULATORY_CARE_PROVIDER_SITE_OTHER): Payer: BC Managed Care – PPO | Admitting: Internal Medicine

## 2020-07-11 ENCOUNTER — Other Ambulatory Visit: Payer: Self-pay

## 2020-07-11 ENCOUNTER — Encounter: Payer: Self-pay | Admitting: Internal Medicine

## 2020-07-11 VITALS — BP 138/66 | HR 100 | Temp 98.3°F | Ht 68.0 in | Wt 221.0 lb

## 2020-07-11 DIAGNOSIS — R000 Tachycardia, unspecified: Secondary | ICD-10-CM

## 2020-07-11 DIAGNOSIS — R5383 Other fatigue: Secondary | ICD-10-CM

## 2020-07-11 DIAGNOSIS — Z23 Encounter for immunization: Secondary | ICD-10-CM | POA: Diagnosis not present

## 2020-07-11 DIAGNOSIS — I509 Heart failure, unspecified: Secondary | ICD-10-CM

## 2020-07-11 DIAGNOSIS — E1165 Type 2 diabetes mellitus with hyperglycemia: Secondary | ICD-10-CM

## 2020-07-11 MED ORDER — METFORMIN HCL 500 MG PO TABS: 1000.0000 mg | ORAL_TABLET | Freq: Two times a day (BID) | ORAL | 3 refills | Status: DC

## 2020-07-11 MED ORDER — EMPAGLIFLOZIN 25 MG PO TABS: 25.0000 mg | ORAL_TABLET | Freq: Every day | ORAL | 3 refills | Status: DC

## 2020-07-11 MED ORDER — PANTOPRAZOLE SODIUM 40 MG PO TBEC: 40.0000 mg | DELAYED_RELEASE_TABLET | Freq: Two times a day (BID) | ORAL | 3 refills | Status: DC

## 2020-07-11 MED ORDER — FREESTYLE LIBRE 14 DAY READER DEVI: 1.0000 [IU] | Freq: Every day | 1 refills | Status: DC

## 2020-07-11 MED ORDER — CITALOPRAM HYDROBROMIDE 40 MG PO TABS: 40.0000 mg | ORAL_TABLET | Freq: Every day | ORAL | 3 refills | Status: DC

## 2020-07-11 MED ORDER — FREESTYLE LIBRE 14 DAY SENSOR MISC: 1.0000 [IU] | 3 refills | Status: DC

## 2020-07-11 NOTE — Assessment & Plan Note (Signed)
Labs

## 2020-07-11 NOTE — Assessment & Plan Note (Signed)
No relapse 

## 2020-07-11 NOTE — Progress Notes (Signed)
Subjective:  Patient ID: [name] [name], female    DOB: 06-30-1966  Age: 54 y.o. MRN: [id number]  CC: No chief complaint on file.   HPI [name] [name] presents for DM, depression, anxiety  Outpatient Medications Prior to Visit  Medication Sig Dispense Refill  . ALPRAZolam (XANAX) 0.5 MG tablet TAKE 1 TABLET(0.5 MG) BY MOUTH TWICE DAILY 60 tablet 2  . aspirin 81 MG chewable tablet Chew 1 tablet (81 mg total) by mouth daily. 90 tablet 1  . atorvastatin (LIPITOR) 10 MG tablet TAKE 1 TABLET BY MOUTH DAILY AT 6PM 90 tablet 3  . buPROPion (WELLBUTRIN SR) 150 MG 12 hr tablet TAKE 1 TABLET(150 MG) BY MOUTH TWICE DAILY 180 tablet 3  . Cholecalciferol (EQL VITAMIN D3) 1000 units tablet Take 1 tablet (1,000 Units total) by mouth daily. 90 tablet 1  . citalopram (CELEXA) 20 MG tablet TAKE 1 TABLET(20 MG) BY MOUTH DAILY 90 tablet 3  . fenofibrate (TRICOR) 145 MG tablet TAKE 1 TABLET(145 MG) BY MOUTH DAILY 90 tablet 3  . methocarbamol (ROBAXIN) 500 MG tablet methocarbamol 500 mg tablet    . nystatin-triamcinolone (MYCOLOG II) cream nystatin-triamcinolone 100,000 unit/g-0.1 % topical cream  APPLY TO THE AFFECTED AREA(S) BY TOPICAL ROUTE 2 TIMES PER DAY IN THE MORNING AND EVENING    . omeprazole (PRILOSEC) 40 MG capsule Take 40 mg by mouth daily.    . promethazine-codeine (PHENERGAN WITH CODEINE) 6.25-10 MG/5ML syrup promethazine 6.25 mg-codeine 10 mg/5 mL syrup    . repaglinide (PRANDIN) 2 MG tablet TAKE 1 TABLET BY MOUTH THREE TIMES DAILY BEFORE MEALS 270 tablet 3  . triamcinolone ointment (KENALOG) 0.1 % APPLY EXTERNALLY TO THE AFFECTED AREA THREE TIMES DAILY 30 g 1  . empagliflozin (JARDIANCE) 25 MG TABS tablet Take 1 tablet (25 mg total) by mouth daily. Annual appt due in August must see provider for future refills 30 tablet 0  . metFORMIN (GLUCOPHAGE) 500 MG tablet Take 2 tablets (1,000 mg total) by mouth 2 (two) times daily with a meal. Patient needs office visit before refills will be given  360 tablet 3  . pantoprazole (PROTONIX) 40 MG tablet Take 1 tablet (40 mg total) by mouth 2 (two) times daily before a meal. 180 tablet 3  . acetaminophen (TYLENOL) 325 MG tablet Take 2 tablets (650 mg total) by mouth every 6 (six) hours as needed. 30 tablet 0  . diclofenac Sodium (VOLTAREN) 1 % GEL Apply 4 g topically 4 (four) times daily. 100 g 0  . ferrous sulfate 325 (65 FE) MG tablet Take 1 tablet (325 mg total) by mouth daily. 90 tablet 1   No facility-administered medications prior to visit.    ROS: Review of Systems  Constitutional: Negative for activity change, appetite change, chills, fatigue and unexpected weight change.  HENT: Negative for congestion, mouth sores and sinus pressure.   Eyes: Negative for visual disturbance.  Respiratory: Negative for cough and chest tightness.   Gastrointestinal: Negative for abdominal pain and nausea.  Genitourinary: Negative for difficulty urinating, frequency and vaginal pain.  Musculoskeletal: Negative for back pain and gait problem.  Skin: Negative for pallor and rash.  Neurological: Negative for dizziness, tremors, weakness, numbness and headaches.  Psychiatric/Behavioral: Positive for dysphoric mood. Negative for confusion, sleep disturbance and suicidal ideas. The patient is nervous/anxious.     Objective:  BP 138/66 (BP Location: Right Arm, Patient Position: Sitting, Cuff Size: Large)   Pulse 100   Temp 98.3 F (36.8 C) (Oral)  Ht 5\' 8"  (1.727 m)   Wt 221 lb (100.2 kg)   SpO2 96%   BMI 33.60 kg/m   BP Readings from Last 3 Encounters:  07/11/20 138/66  06/20/20 (!) 147/68  04/27/20 (!) 158/68    Wt Readings from Last 3 Encounters:  07/11/20 221 lb (100.2 kg)  06/20/20 221 lb 4.8 oz (100.4 kg)  12/14/19 221 lb (100.2 kg)    Physical Exam Constitutional:      General: She is not in acute distress.    Appearance: She is well-developed. She is obese.  HENT:     Head: Normocephalic.     Right Ear: External ear  normal.     Left Ear: External ear normal.     Nose: Nose normal.  Eyes:     General:        Right eye: No discharge.        Left eye: No discharge.     Conjunctiva/sclera: Conjunctivae normal.     Pupils: Pupils are equal, round, and reactive to light.  Neck:     Thyroid: No thyromegaly.     Vascular: No JVD.     Trachea: No tracheal deviation.  Cardiovascular:     Rate and Rhythm: Normal rate and regular rhythm.     Heart sounds: Normal heart sounds.  Pulmonary:     Effort: No respiratory distress.     Breath sounds: No stridor. No wheezing.  Abdominal:     General: Bowel sounds are normal. There is no distension.     Palpations: Abdomen is soft. There is no mass.     Tenderness: There is no abdominal tenderness. There is no guarding or rebound.  Musculoskeletal:        General: No tenderness.     Cervical back: Normal range of motion and neck supple.  Lymphadenopathy:     Cervical: No cervical adenopathy.  Skin:    Findings: No erythema or rash.  Neurological:     Cranial Nerves: No cranial nerve deficit.     Motor: No abnormal muscle tone.     Coordination: Coordination normal.     Deep Tendon Reflexes: Reflexes normal.  Psychiatric:        Behavior: Behavior normal.        Thought Content: Thought content normal.        Judgment: Judgment normal.     Lab Results  Component Value Date   WBC 8.1 06/20/2020   HGB 12.6 06/20/2020   HCT 38.3 06/20/2020   PLT 169 06/20/2020   GLUCOSE 250 (H) 08/11/2019   CHOL 156 08/11/2019   TRIG (H) 08/11/2019    554.0 Triglyceride is over 400; calculations on Lipids are invalid.   HDL 28.10 (L) 08/11/2019   LDLDIRECT 69.0 08/11/2019   LDLCALC 97 10/23/2017   ALT 43 (H) 08/11/2019   AST 42 (H) 08/11/2019   NA 136 08/11/2019   K 4.1 08/11/2019   CL 102 08/11/2019   CREATININE 0.78 08/11/2019   BUN 15 08/11/2019   CO2 22 08/11/2019   TSH 2.31 08/11/2019   HGBA1C 8.8 (H) 08/11/2019   MICROALBUR 1.5 08/11/2019    No  results found.  Assessment & Plan:   There are no diagnoses linked to this encounter.   Meds ordered this encounter  Medications  . pantoprazole (PROTONIX) 40 MG tablet    Sig: Take 1 tablet (40 mg total) by mouth 2 (two) times daily before a meal.    Dispense:  180  tablet    Refill:  3  . metFORMIN (GLUCOPHAGE) 500 MG tablet    Sig: Take 2 tablets (1,000 mg total) by mouth 2 (two) times daily with a meal.    Dispense:  360 tablet    Refill:  3  . empagliflozin (JARDIANCE) 25 MG TABS tablet    Sig: Take 1 tablet (25 mg total) by mouth daily.    Dispense:  90 tablet    Refill:  3     Follow-up: No follow-ups on file.  [name], MD

## 2020-07-31 ENCOUNTER — Other Ambulatory Visit (INDEPENDENT_AMBULATORY_CARE_PROVIDER_SITE_OTHER): Payer: BC Managed Care – PPO

## 2020-07-31 DIAGNOSIS — R5383 Other fatigue: Secondary | ICD-10-CM

## 2020-07-31 DIAGNOSIS — R000 Tachycardia, unspecified: Secondary | ICD-10-CM | POA: Diagnosis not present

## 2020-07-31 DIAGNOSIS — Z794 Long term (current) use of insulin: Secondary | ICD-10-CM | POA: Diagnosis not present

## 2020-07-31 DIAGNOSIS — E11641 Type 2 diabetes mellitus with hypoglycemia with coma: Secondary | ICD-10-CM | POA: Diagnosis not present

## 2020-07-31 DIAGNOSIS — E1165 Type 2 diabetes mellitus with hyperglycemia: Secondary | ICD-10-CM | POA: Diagnosis not present

## 2020-07-31 LAB — BASIC METABOLIC PANEL
BUN: 10 mg/dL (ref 6–23)
CO2: 26 mEq/L (ref 19–32)
Calcium: 9.4 mg/dL (ref 8.4–10.5)
Chloride: 100 mEq/L (ref 96–112)
Creatinine, Ser: 0.64 mg/dL (ref 0.40–1.20)
GFR: 97.59 mL/min (ref >60.00)
Glucose, Bld: 243 mg/dL — ABNORMAL HIGH (ref 70–99)
Potassium: 3.9 mEq/L (ref 3.5–5.1)
Sodium: 137 mEq/L (ref 135–145)

## 2020-07-31 LAB — MICROALBUMIN / CREATININE URINE RATIO
Creatinine,U: 20.7 mg/dL
Microalb Creat Ratio: 14.2 mg/g (ref 0.0–30.0)
Microalb, Ur: 2.9 mg/dL — ABNORMAL HIGH (ref 0.0–1.9)

## 2020-07-31 LAB — VITAMIN B12: Vitamin B-12: 691 pg/mL (ref 211–911)

## 2020-07-31 LAB — T4, FREE: Free T4: 1.03 ng/dL (ref 0.60–1.60)

## 2020-07-31 LAB — HEMOGLOBIN A1C: Hgb A1c MFr Bld: 9.9 % — ABNORMAL HIGH (ref 4.6–6.5)

## 2020-07-31 LAB — TSH: TSH: 3.1 u[IU]/mL (ref 0.35–4.50)

## 2020-07-31 NOTE — Addendum Note (Signed)
Addended by: Jacob Moores on: 07/31/2020 07:29 AM   Modules accepted: Orders

## 2020-08-03 ENCOUNTER — Other Ambulatory Visit: Payer: Self-pay | Admitting: Internal Medicine

## 2020-11-12 ENCOUNTER — Ambulatory Visit: Payer: BC Managed Care – PPO | Admitting: Internal Medicine

## 2020-11-12 DIAGNOSIS — Z0289 Encounter for other administrative examinations: Secondary | ICD-10-CM

## 2020-11-14 ENCOUNTER — Other Ambulatory Visit: Payer: Self-pay

## 2020-11-14 ENCOUNTER — Encounter: Payer: Self-pay | Admitting: Internal Medicine

## 2020-11-14 ENCOUNTER — Ambulatory Visit (INDEPENDENT_AMBULATORY_CARE_PROVIDER_SITE_OTHER): Payer: BC Managed Care – PPO | Admitting: Internal Medicine

## 2020-11-14 DIAGNOSIS — F17200 Nicotine dependence, unspecified, uncomplicated: Secondary | ICD-10-CM

## 2020-11-14 DIAGNOSIS — E1165 Type 2 diabetes mellitus with hyperglycemia: Secondary | ICD-10-CM

## 2020-11-14 DIAGNOSIS — Z23 Encounter for immunization: Secondary | ICD-10-CM

## 2020-11-14 DIAGNOSIS — E1142 Type 2 diabetes mellitus with diabetic polyneuropathy: Secondary | ICD-10-CM

## 2020-11-14 DIAGNOSIS — IMO0002 Reserved for concepts with insufficient information to code with codable children: Secondary | ICD-10-CM

## 2020-11-14 DIAGNOSIS — E1140 Type 2 diabetes mellitus with diabetic neuropathy, unspecified: Secondary | ICD-10-CM | POA: Insufficient documentation

## 2020-11-14 LAB — HEMOGLOBIN A1C: Hgb A1c MFr Bld: 9.5 % — ABNORMAL HIGH (ref 4.6–6.5)

## 2020-11-14 LAB — BASIC METABOLIC PANEL
BUN: 15 mg/dL (ref 6–23)
CO2: 26 mEq/L (ref 19–32)
Calcium: 9.7 mg/dL (ref 8.4–10.5)
Chloride: 99 mEq/L (ref 96–112)
Creatinine, Ser: 0.73 mg/dL (ref 0.40–1.20)
GFR: 94.9 mL/min (ref >60.00)
Glucose, Bld: 221 mg/dL — ABNORMAL HIGH (ref 70–99)
Potassium: 4.3 mEq/L (ref 3.5–5.1)
Sodium: 134 mEq/L — ABNORMAL LOW (ref 135–145)

## 2020-11-14 MED ORDER — EMPAGLIFLOZIN-LINAGLIPTIN 25-5 MG PO TABS: 1.0000 | ORAL_TABLET | ORAL | 3 refills | Status: AC

## 2020-11-14 MED ORDER — CITALOPRAM HYDROBROMIDE 40 MG PO TABS
40.0000 mg | ORAL_TABLET | Freq: Every day | ORAL | 3 refills | Status: DC
Start: 2020-11-14 — End: 2020-11-14

## 2020-11-14 MED ORDER — CITALOPRAM HYDROBROMIDE 40 MG PO TABS
40.0000 mg | ORAL_TABLET | Freq: Every day | ORAL | 3 refills | Status: DC
Start: 2020-11-14 — End: 2021-08-15

## 2020-11-14 NOTE — Assessment & Plan Note (Signed)
Rare smoking - almost quit Discussed options. Increased Celexa to 40 mg/d RTC 3 mo

## 2020-11-14 NOTE — Progress Notes (Signed)
Subjective:  Patient ID: [name] [name], female    DOB: [birth year]  Age: 54 y.o. MRN: [id number]  CC: Annual Exam   HPI [name] [name] presents for DM w/HgbA1c >200 a lot  C/o fatigue, neuropathy.  Follow-up on dyslipidemia.   Outpatient Medications Prior to Visit  Medication Sig Dispense Refill  . acetaminophen (TYLENOL) 325 MG tablet Take 2 tablets (650 mg total) by mouth every 6 (six) hours as needed. 30 tablet 0  . ALPRAZolam (XANAX) 0.5 MG tablet TAKE 1 TABLET(0.5 MG) BY MOUTH TWICE DAILY 60 tablet 2  . aspirin 81 MG chewable tablet Chew 1 tablet (81 mg total) by mouth daily. 90 tablet 1  . atorvastatin (LIPITOR) 10 MG tablet TAKE 1 TABLET BY MOUTH DAILY AT 6PM 90 tablet 3  . buPROPion (WELLBUTRIN SR) 150 MG 12 hr tablet TAKE 1 TABLET(150 MG) BY MOUTH TWICE DAILY 180 tablet 3  . Cholecalciferol (EQL VITAMIN D3) 1000 units tablet Take 1 tablet (1,000 Units total) by mouth daily. 90 tablet 1  . citalopram (CELEXA) 40 MG tablet Take 1 tablet (40 mg total) by mouth daily. 90 tablet 3  . Continuous Blood Gluc Receiver (FREESTYLE LIBRE 14 DAY READER) DEVI 1 Units by Does not apply route daily. 1 each 1  . Continuous Blood Gluc Sensor (FREESTYLE LIBRE 14 DAY SENSOR) MISC 1 Units by Does not apply route every 14 (fourteen) days. 6 each 3  . diclofenac Sodium (VOLTAREN) 1 % GEL Apply 4 g topically 4 (four) times daily. 100 g 0  . fenofibrate (TRICOR) 145 MG tablet TAKE 1 TABLET(145 MG) BY MOUTH DAILY 90 tablet 3  . JARDIANCE 25 MG TABS tablet TAKE 1 TABLET BY MOUTH DAILY. ANNUAL APPT DUE IN AUGUST MUST SEE PROVIDER FOR FUTURE REFILLS 30 tablet 11  . metFORMIN (GLUCOPHAGE) 500 MG tablet Take 2 tablets (1,000 mg total) by mouth 2 (two) times daily with a meal. 360 tablet 3  . methocarbamol (ROBAXIN) 500 MG tablet methocarbamol 500 mg tablet    . nystatin-triamcinolone (MYCOLOG II) cream nystatin-triamcinolone 100,000 unit/g-0.1 % topical cream  APPLY TO THE AFFECTED AREA(S) BY TOPICAL  ROUTE 2 TIMES PER DAY IN THE MORNING AND EVENING    . omeprazole (PRILOSEC) 40 MG capsule Take 40 mg by mouth daily.    . pantoprazole (PROTONIX) 40 MG tablet Take 1 tablet (40 mg total) by mouth 2 (two) times daily before a meal. 180 tablet 3  . promethazine-codeine (PHENERGAN WITH CODEINE) 6.25-10 MG/5ML syrup promethazine 6.25 mg-codeine 10 mg/5 mL syrup    . repaglinide (PRANDIN) 2 MG tablet TAKE 1 TABLET BY MOUTH THREE TIMES DAILY BEFORE MEALS 270 tablet 3  . triamcinolone ointment (KENALOG) 0.1 % APPLY EXTERNALLY TO THE AFFECTED AREA THREE TIMES DAILY 30 g 1  . ferrous sulfate 325 (65 FE) MG tablet Take 1 tablet (325 mg total) by mouth daily. 90 tablet 1  . citalopram (CELEXA) 20 MG tablet TAKE 1 TABLET(20 MG) BY MOUTH DAILY (Patient not taking: Reported on 11/14/2020) 90 tablet 3   No facility-administered medications prior to visit.    ROS: Review of Systems  Constitutional: Positive for fatigue. Negative for activity change, appetite change, chills and unexpected weight change.  HENT: Negative for congestion, mouth sores and sinus pressure.   Eyes: Negative for visual disturbance.  Respiratory: Negative for cough and chest tightness.   Gastrointestinal: Negative for abdominal pain and nausea.  Genitourinary: Negative for difficulty urinating, frequency and vaginal pain.  Musculoskeletal: Negative for  back pain and gait problem.  Skin: Negative for pallor and rash.  Neurological: Negative for dizziness, tremors, weakness, numbness and headaches.  Psychiatric/Behavioral: Positive for dysphoric mood. Negative for confusion and sleep disturbance. The patient is nervous/anxious.     Objective:  BP 140/82 (BP Location: Left Arm)   Pulse 93   Temp 98.6 F (37 C) (Oral)   Wt 215 lb 6.4 oz (97.7 kg)   SpO2 97%   BMI 32.75 kg/m   BP Readings from Last 3 Encounters:  11/14/20 140/82  07/11/20 138/66  06/20/20 (!) 147/68    Wt Readings from Last 3 Encounters:  11/14/20 215 lb  6.4 oz (97.7 kg)  07/11/20 221 lb (100.2 kg)  06/20/20 221 lb 4.8 oz (100.4 kg)    Physical Exam Constitutional:      General: She is not in acute distress.    Appearance: She is well-developed. She is obese.  HENT:     Head: Normocephalic.     Right Ear: External ear normal.     Left Ear: External ear normal.     Nose: Nose normal.  Eyes:     General:        Right eye: No discharge.        Left eye: No discharge.     Conjunctiva/sclera: Conjunctivae normal.     Pupils: Pupils are equal, round, and reactive to light.  Neck:     Thyroid: No thyromegaly.     Vascular: No JVD.     Trachea: No tracheal deviation.  Cardiovascular:     Rate and Rhythm: Normal rate and regular rhythm.     Heart sounds: Normal heart sounds.  Pulmonary:     Effort: No respiratory distress.     Breath sounds: No stridor. No wheezing.  Abdominal:     General: Bowel sounds are normal. There is no distension.     Palpations: Abdomen is soft. There is no mass.     Tenderness: There is no abdominal tenderness. There is no guarding or rebound.  Musculoskeletal:        General: No tenderness.     Cervical back: Normal range of motion and neck supple.  Lymphadenopathy:     Cervical: No cervical adenopathy.  Skin:    Findings: No erythema or rash.  Neurological:     Cranial Nerves: No cranial nerve deficit.     Motor: No abnormal muscle tone.     Coordination: Coordination normal.     Deep Tendon Reflexes: Reflexes normal.  Psychiatric:        Behavior: Behavior normal.        Thought Content: Thought content normal.        Judgment: Judgment normal.     Lab Results  Component Value Date   WBC 8.1 06/20/2020   HGB 12.6 06/20/2020   HCT 38.3 06/20/2020   PLT 169 06/20/2020   GLUCOSE 243 (H) 07/31/2020   CHOL 156 08/11/2019   TRIG (H) 08/11/2019    554.0 Triglyceride is over 400; calculations on Lipids are invalid.   HDL 28.10 (L) 08/11/2019   LDLDIRECT 69.0 08/11/2019   LDLCALC 97  10/23/2017   ALT 43 (H) 08/11/2019   AST 42 (H) 08/11/2019   NA 137 07/31/2020   K 3.9 07/31/2020   CL 100 07/31/2020   CREATININE 0.64 07/31/2020   BUN 10 07/31/2020   CO2 26 07/31/2020   TSH 3.10 07/31/2020   HGBA1C 9.9 (H) 07/31/2020   MICROALBUR 2.9 (H)  07/31/2020    No results found.  Assessment & Plan:    [name], MD

## 2020-11-14 NOTE — Patient Instructions (Signed)
   B-complex with Niacin 100 mg    Lion's mane  

## 2020-11-14 NOTE — Assessment & Plan Note (Addendum)
High CBGs D/c Jardiance, start Glyxambi 5/25

## 2020-11-14 NOTE — Assessment & Plan Note (Signed)
B complex w/Niacin, Lion'smane

## 2020-11-30 ENCOUNTER — Other Ambulatory Visit: Payer: Self-pay | Admitting: *Deleted

## 2020-11-30 MED ORDER — FENOFIBRATE 145 MG PO TABS
ORAL_TABLET | ORAL | 3 refills | Status: DC
Start: 2020-11-30 — End: 2021-08-15

## 2020-12-30 ENCOUNTER — Other Ambulatory Visit: Payer: Self-pay | Admitting: Internal Medicine

## 2020-12-31 NOTE — Telephone Encounter (Signed)
Check [place] registry last filled 07/04/2020.MD is out of the office pls advise...[name] [name]

## 2021-01-23 ENCOUNTER — Other Ambulatory Visit: Payer: Self-pay | Admitting: Oncology

## 2021-01-23 DIAGNOSIS — D509 Iron deficiency anemia, unspecified: Secondary | ICD-10-CM

## 2021-01-24 ENCOUNTER — Inpatient Hospital Stay (HOSPITAL_BASED_OUTPATIENT_CLINIC_OR_DEPARTMENT_OTHER): Payer: BC Managed Care – PPO | Admitting: Oncology

## 2021-01-24 ENCOUNTER — Encounter: Payer: Self-pay | Admitting: Internal Medicine

## 2021-01-24 ENCOUNTER — Ambulatory Visit (INDEPENDENT_AMBULATORY_CARE_PROVIDER_SITE_OTHER): Payer: BC Managed Care – PPO | Admitting: Internal Medicine

## 2021-01-24 ENCOUNTER — Other Ambulatory Visit: Payer: Self-pay

## 2021-01-24 ENCOUNTER — Inpatient Hospital Stay: Payer: BC Managed Care – PPO | Attending: Oncology

## 2021-01-24 VITALS — BP 164/69 | HR 102 | Temp 97.4°F | Resp 20 | Ht 68.0 in | Wt 211.7 lb

## 2021-01-24 DIAGNOSIS — Z7984 Long term (current) use of oral hypoglycemic drugs: Secondary | ICD-10-CM | POA: Insufficient documentation

## 2021-01-24 DIAGNOSIS — L710 Perioral dermatitis: Secondary | ICD-10-CM | POA: Insufficient documentation

## 2021-01-24 DIAGNOSIS — E1165 Type 2 diabetes mellitus with hyperglycemia: Secondary | ICD-10-CM

## 2021-01-24 DIAGNOSIS — Z7982 Long term (current) use of aspirin: Secondary | ICD-10-CM | POA: Diagnosis not present

## 2021-01-24 DIAGNOSIS — D509 Iron deficiency anemia, unspecified: Secondary | ICD-10-CM

## 2021-01-24 DIAGNOSIS — D500 Iron deficiency anemia secondary to blood loss (chronic): Secondary | ICD-10-CM | POA: Insufficient documentation

## 2021-01-24 DIAGNOSIS — Z79899 Other long term (current) drug therapy: Secondary | ICD-10-CM | POA: Insufficient documentation

## 2021-01-24 DIAGNOSIS — J0100 Acute maxillary sinusitis, unspecified: Secondary | ICD-10-CM

## 2021-01-24 DIAGNOSIS — F419 Anxiety disorder, unspecified: Secondary | ICD-10-CM

## 2021-01-24 LAB — CBC WITH DIFFERENTIAL (CANCER CENTER ONLY)
Abs Immature Granulocytes: 0.05 10*3/uL (ref 0.00–0.07)
Basophils Absolute: 0.1 10*3/uL (ref 0.0–0.1)
Basophils Relative: 1 %
Eosinophils Absolute: 0.2 10*3/uL (ref 0.0–0.5)
Eosinophils Relative: 3 %
HCT: 41.8 % (ref 36.0–46.0)
Hemoglobin: 13.5 g/dL (ref 12.0–15.0)
Immature Granulocytes: 1 %
Lymphocytes Relative: 25 %
Lymphs Abs: 1.9 10*3/uL (ref 0.7–4.0)
MCH: 28.5 pg (ref 26.0–34.0)
MCHC: 32.3 g/dL (ref 30.0–36.0)
MCV: 88.4 fL (ref 80.0–100.0)
Monocytes Absolute: 0.3 10*3/uL (ref 0.1–1.0)
Monocytes Relative: 5 %
Neutro Abs: 5.1 10*3/uL (ref 1.7–7.7)
Neutrophils Relative %: 65 %
Platelet Count: 190 10*3/uL (ref 150–400)
RBC: 4.73 MIL/uL (ref 3.87–5.11)
RDW: 13.3 % (ref 11.5–15.5)
WBC Count: 7.6 10*3/uL (ref 4.0–10.5)
nRBC: 0 % (ref 0.0–0.2)

## 2021-01-24 LAB — IRON AND TIBC
Iron: 93 ug/dL (ref 41–142)
Saturation Ratios: 23 % (ref 21–57)
TIBC: 405 ug/dL (ref 236–444)
UIBC: 312 ug/dL (ref 120–384)

## 2021-01-24 LAB — BASIC METABOLIC PANEL - CANCER CENTER ONLY
Anion gap: 10 (ref 5–15)
BUN: 17 mg/dL (ref 6–20)
CO2: 23 mmol/L (ref 22–32)
Calcium: 9.5 mg/dL (ref 8.9–10.3)
Chloride: 105 mmol/L (ref 98–111)
Creatinine: 0.79 mg/dL (ref 0.44–1.00)
GFR, Estimated: >60 mL/min (ref >60)
Glucose, Bld: 201 mg/dL — ABNORMAL HIGH (ref 70–99)
Potassium: 4.5 mmol/L (ref 3.5–5.1)
Sodium: 138 mmol/L (ref 135–145)

## 2021-01-24 LAB — FERRITIN: Ferritin: 35 ng/mL (ref 11–307)

## 2021-01-24 LAB — HEMOGLOBIN A1C
Hgb A1c MFr Bld: 9.7 % — ABNORMAL HIGH (ref 4.8–5.6)
Mean Plasma Glucose: 231.69 mg/dL

## 2021-01-24 MED ORDER — AMOXICILLIN-POT CLAVULANATE 875-125 MG PO TABS: 1.0000 | ORAL_TABLET | Freq: Two times a day (BID) | ORAL | 1 refills | Status: DC

## 2021-01-24 MED ORDER — CLINDAMYCIN PHOSPHATE 1 % EX GEL: Freq: Two times a day (BID) | CUTANEOUS | 2 refills | Status: DC

## 2021-01-24 NOTE — Assessment & Plan Note (Signed)
Refractory  Augmentin po

## 2021-01-24 NOTE — Progress Notes (Signed)
Subjective:  Patient ID: [name] [name], female    DOB: [birth year]  Age: 55 y.o. MRN: [id number]  CC: Headache (SINUS DRAINAGE)   HPI [name] [name] presents for HAs and sinus congestion, a lot of mucus of several weeks duration Follow-up on diabetes.  On [place] her supply and taking Metformin and Prandin.  She has not started the [facility] yet.   Outpatient Medications Prior to Visit  Medication Sig Dispense Refill  . ALPRAZolam (XANAX) 0.5 MG tablet TAKE 1 TABLET(0.5 MG) BY MOUTH TWICE DAILY 60 tablet 0  . aspirin 81 MG chewable tablet Chew 1 tablet (81 mg total) by mouth daily. 90 tablet 1  . atorvastatin (LIPITOR) 10 MG tablet TAKE 1 TABLET BY MOUTH DAILY AT 6PM 90 tablet 3  . buPROPion (WELLBUTRIN SR) 150 MG 12 hr tablet TAKE 1 TABLET(150 MG) BY MOUTH TWICE DAILY 180 tablet 3  . Cholecalciferol (EQL VITAMIN D3) 1000 units tablet Take 1 tablet (1,000 Units total) by mouth daily. 90 tablet 1  . citalopram (CELEXA) 40 MG tablet Take 1 tablet (40 mg total) by mouth daily. 90 tablet 3  . fenofibrate (TRICOR) 145 MG tablet TAKE 1 TABLET(145 MG) BY MOUTH DAILY 90 tablet 3  . metFORMIN (GLUCOPHAGE) 500 MG tablet Take 2 tablets (1,000 mg total) by mouth 2 (two) times daily with a meal. 360 tablet 3  . methocarbamol (ROBAXIN) 500 MG tablet methocarbamol 500 mg tablet    . nystatin-triamcinolone (MYCOLOG II) cream nystatin-triamcinolone 100,000 unit/g-0.1 % topical cream  APPLY TO THE AFFECTED AREA(S) BY TOPICAL ROUTE 2 TIMES PER DAY IN THE MORNING AND EVENING    . pantoprazole (PROTONIX) 40 MG tablet Take 1 tablet (40 mg total) by mouth 2 (two) times daily before a meal. 180 tablet 3  . repaglinide (PRANDIN) 2 MG tablet TAKE 1 TABLET BY MOUTH THREE TIMES DAILY BEFORE MEALS 270 tablet 3  . acetaminophen (TYLENOL) 325 MG tablet Take 2 tablets (650 mg total) by mouth every 6 (six) hours as needed. 30 tablet 0  . Continuous Blood Gluc Receiver (FREESTYLE LIBRE 14 DAY READER) DEVI 1  Units by Does not apply route daily. 1 each 1  . Continuous Blood Gluc Sensor (FREESTYLE LIBRE 14 DAY SENSOR) MISC 1 Units by Does not apply route every 14 (fourteen) days. 6 each 3  . diclofenac Sodium (VOLTAREN) 1 % GEL Apply 4 g topically 4 (four) times daily. 100 g 0  . ferrous sulfate 325 (65 FE) MG tablet Take 1 tablet (325 mg total) by mouth daily. 90 tablet 1  . omeprazole (PRILOSEC) 40 MG capsule Take 40 mg by mouth daily. (Patient not taking: Reported on 01/24/2021)    . promethazine-codeine (PHENERGAN WITH CODEINE) 6.25-10 MG/5ML syrup promethazine 6.25 mg-codeine 10 mg/5 mL syrup    . triamcinolone ointment (KENALOG) 0.1 % APPLY EXTERNALLY TO THE AFFECTED AREA THREE TIMES DAILY 30 g 1   No facility-administered medications prior to visit.    ROS: Review of Systems  Constitutional: Positive for fatigue. Negative for activity change, appetite change, chills and unexpected weight change.  HENT: Positive for congestion, postnasal drip, rhinorrhea and sinus pressure. Negative for mouth sores.   Eyes: Negative for visual disturbance.  Respiratory: Negative for cough and chest tightness.   Gastrointestinal: Negative for abdominal pain and nausea.  Genitourinary: Negative for difficulty urinating, frequency and vaginal pain.  Musculoskeletal: Negative for back pain and gait problem.  Skin: Negative for pallor and rash.  Neurological: Negative for dizziness, tremors,  weakness, numbness and headaches.  Psychiatric/Behavioral: Negative for confusion and sleep disturbance.    Objective:  BP (!) 148/70 (BP Location: Left Arm)   Pulse 94   Temp 98 F (36.7 C) (Oral)   Ht 5\' 8"  (1.727 m)   Wt 214 lb 3.2 oz (97.2 kg)   SpO2 97%   BMI 32.57 kg/m   BP Readings from Last 3 Encounters:  01/24/21 (!) 148/70  01/24/21 (!) 164/69  11/14/20 140/82    Wt Readings from Last 3 Encounters:  01/24/21 214 lb 3.2 oz (97.2 kg)  01/24/21 211 lb 11.2 oz (96 kg)  11/14/20 215 lb 6.4 oz (97.7 kg)     Physical Exam Constitutional:      General: She is not in acute distress.    Appearance: She is well-developed. She is obese.  HENT:     Head: Normocephalic.     Right Ear: External ear normal.     Left Ear: External ear normal.     Nose: Nose normal.     Mouth/Throat:     Mouth: Oropharynx is clear and moist.  Eyes:     General:        Right eye: No discharge.        Left eye: No discharge.     Conjunctiva/sclera: Conjunctivae normal.     Pupils: Pupils are equal, round, and reactive to light.  Neck:     Thyroid: No thyromegaly.     Vascular: No JVD.     Trachea: No tracheal deviation.  Cardiovascular:     Rate and Rhythm: Normal rate and regular rhythm.     Heart sounds: Normal heart sounds.  Pulmonary:     Effort: No respiratory distress.     Breath sounds: No stridor. No wheezing.  Abdominal:     General: Bowel sounds are normal. There is no distension.     Palpations: Abdomen is soft. There is no mass.     Tenderness: There is no abdominal tenderness. There is no guarding or rebound.  Musculoskeletal:        General: No tenderness or edema.     Cervical back: Normal range of motion and neck supple.  Lymphadenopathy:     Cervical: No cervical adenopathy.  Skin:    Findings: No erythema or rash.  Neurological:     Cranial Nerves: No cranial nerve deficit.     Motor: No abnormal muscle tone.     Coordination: Coordination normal.     Deep Tendon Reflexes: Reflexes normal.  Psychiatric:        Mood and Affect: Mood and affect normal.        Behavior: Behavior normal.        Thought Content: Thought content normal.        Judgment: Judgment normal.   Yellow mucus in the nares Perioral rash-papules Lab Results  Component Value Date   WBC 7.6 01/24/2021   HGB 13.5 01/24/2021   HCT 41.8 01/24/2021   PLT 190 01/24/2021   GLUCOSE 201 (H) 01/24/2021   CHOL 156 08/11/2019   TRIG (H) 08/11/2019    554.0 Triglyceride is over 400; calculations on Lipids are  invalid.   HDL 28.10 (L) 08/11/2019   LDLDIRECT 69.0 08/11/2019   LDLCALC 97 10/23/2017   ALT 43 (H) 08/11/2019   AST 42 (H) 08/11/2019   NA 138 01/24/2021   K 4.5 01/24/2021   CL 105 01/24/2021   CREATININE 0.79 01/24/2021   BUN 17 01/24/2021  CO2 23 01/24/2021   TSH 3.10 07/31/2020   HGBA1C 9.7 (H) 01/24/2021   MICROALBUR 2.9 (H) 07/31/2020    No results found.  Assessment & Plan:    [name], MD

## 2021-01-24 NOTE — Progress Notes (Signed)
Hematology and Oncology Follow Up Visit  [name] [name] [id number] [birth year] 55 y.o. 01/24/2021 9:17 AM [name], [name], MDPlotnikov, [name], MD   Principle Diagnosis: 55 year old woman with iron deficiency anemia related to chronic menstrual blood losses diagnosed in 2014.    Prior Therapy: IV iron infusion as needed.  Last infusion was in the form of Venofer in October 2020.  Current therapy: Repeat intravenous iron as needed.  Interim History: [name] presents today for a follow-up visit.  Since her last visit, she reports no major changes in her health.  She denies any recent hospitalization or illnesses.  She denies any hematochezia, melena or hemoptysis.  She denies any shortness of breath or difficulty breathing.     Medications: Updated on review. Current Outpatient Medications  Medication Sig Dispense Refill  . ALPRAZolam (XANAX) 0.5 MG tablet TAKE 1 TABLET(0.5 MG) BY MOUTH TWICE DAILY 60 tablet 0  . acetaminophen (TYLENOL) 325 MG tablet Take 2 tablets (650 mg total) by mouth every 6 (six) hours as needed. 30 tablet 0  . aspirin 81 MG chewable tablet Chew 1 tablet (81 mg total) by mouth daily. 90 tablet 1  . atorvastatin (LIPITOR) 10 MG tablet TAKE 1 TABLET BY MOUTH DAILY AT 6PM 90 tablet 3  . buPROPion (WELLBUTRIN SR) 150 MG 12 hr tablet TAKE 1 TABLET(150 MG) BY MOUTH TWICE DAILY 180 tablet 3  . Cholecalciferol (EQL VITAMIN D3) 1000 units tablet Take 1 tablet (1,000 Units total) by mouth daily. 90 tablet 1  . citalopram (CELEXA) 40 MG tablet Take 1 tablet (40 mg total) by mouth daily. 90 tablet 3  . Continuous Blood Gluc Receiver (FREESTYLE LIBRE 14 DAY READER) DEVI 1 Units by Does not apply route daily. 1 each 1  . Continuous Blood Gluc Sensor (FREESTYLE LIBRE 14 DAY SENSOR) MISC 1 Units by Does not apply route every 14 (fourteen) days. 6 each 3  . diclofenac Sodium (VOLTAREN) 1 % GEL Apply 4 g topically 4 (four) times daily. 100 g 0  . fenofibrate (TRICOR) 145 MG  tablet TAKE 1 TABLET(145 MG) BY MOUTH DAILY 90 tablet 3  . ferrous sulfate 325 (65 FE) MG tablet Take 1 tablet (325 mg total) by mouth daily. 90 tablet 1  . metFORMIN (GLUCOPHAGE) 500 MG tablet Take 2 tablets (1,000 mg total) by mouth 2 (two) times daily with a meal. 360 tablet 3  . methocarbamol (ROBAXIN) 500 MG tablet methocarbamol 500 mg tablet    . nystatin-triamcinolone (MYCOLOG II) cream nystatin-triamcinolone 100,000 unit/g-0.1 % topical cream  APPLY TO THE AFFECTED AREA(S) BY TOPICAL ROUTE 2 TIMES PER DAY IN THE MORNING AND EVENING    . omeprazole (PRILOSEC) 40 MG capsule Take 40 mg by mouth daily.    . pantoprazole (PROTONIX) 40 MG tablet Take 1 tablet (40 mg total) by mouth 2 (two) times daily before a meal. 180 tablet 3  . promethazine-codeine (PHENERGAN WITH CODEINE) 6.25-10 MG/5ML syrup promethazine 6.25 mg-codeine 10 mg/5 mL syrup    . repaglinide (PRANDIN) 2 MG tablet TAKE 1 TABLET BY MOUTH THREE TIMES DAILY BEFORE MEALS 270 tablet 3  . triamcinolone ointment (KENALOG) 0.1 % APPLY EXTERNALLY TO THE AFFECTED AREA THREE TIMES DAILY 30 g 1   No current facility-administered medications for this visit.     Allergies: No Known Allergies     Physical Exam: Blood pressure (!) 164/69, pulse (!) 102, temperature (!) 97.4 F (36.3 C), temperature source Tympanic, resp. rate 20, height 5\' 8"  (1.727 m),  weight 211 lb 11.2 oz (96 kg), SpO2 98 %.    ECOG: 0    General appearance: Comfortable appearing without any discomfort Head: Normocephalic without any trauma Oropharynx: Mucous membranes are moist and pink without any thrush or ulcers. Eyes: Pupils are equal and round reactive to light. Lymph nodes: No cervical, supraclavicular, inguinal or axillary lymphadenopathy.   Heart:regular rate and rhythm.  S1 and S2 without leg edema. Lung: Clear without any rhonchi or wheezes.  No dullness to percussion. Abdomin: Soft, nontender, nondistended with good bowel sounds.  No  hepatosplenomegaly. Musculoskeletal: No joint deformity or effusion.  Full range of motion noted. Neurological: No deficits noted on motor, sensory and deep tendon reflex exam. Skin: No petechial rash or dryness.  Appeared moist.        Lab Results: Lab Results  Component Value Date   WBC 8.1 06/20/2020   HGB 12.6 06/20/2020   HCT 38.3 06/20/2020   MCV 89.9 06/20/2020   PLT 169 06/20/2020     Chemistry      Component Value Date/Time   NA 134 (L) 11/14/2020 1052   NA 135 (L) 12/06/2015 1316   K 4.3 11/14/2020 1052   K 3.7 12/06/2015 1316   CL 99 11/14/2020 1052   CO2 26 11/14/2020 1052   CO2 21 (L) 12/06/2015 1316   BUN 15 11/14/2020 1052   BUN 7.2 12/06/2015 1316   CREATININE 0.73 11/14/2020 1052   CREATININE 0.7 12/06/2015 1316      Component Value Date/Time   CALCIUM 9.7 11/14/2020 1052   CALCIUM 9.1 12/06/2015 1316   ALKPHOS 60 08/11/2019 0742   ALKPHOS 99 12/06/2015 1316   AST 42 (H) 08/11/2019 0742   AST 17 12/06/2015 1316   ALT 43 (H) 08/11/2019 0742   ALT 21 12/06/2015 1316   BILITOT 0.7 08/11/2019 0742   BILITOT 0.63 12/06/2015 1316      Results for [name] [name] (MRN [id number]) as of 06/20/2020 14:45  Ref. Range 12/14/2019 08:12  Iron Latest Ref Range: 41 - 142 ug/dL 70  UIBC Latest Ref Range: 120 - 384 ug/dL 260  TIBC Latest Ref Range: 236 - 444 ug/dL 330  Saturation Ratios Latest Ref Range: 21 - 57 % 21  Ferritin Latest Ref Range: 11 - 307 ng/mL 81      Impression and Plan:  55 year old woman with:   1.  Iron deficiency anemia diagnosed in 2014 related to chronic menstrual losses.  Laboratory data the last 6 months were reviewed and showed normalization of her hemoglobin and iron studies and has not required any intravenous iron since October 2020.  Risks and benefits of repeat intravenous iron were discussed at this time. The likelihood of needing IV iron infusion were reiterated.  Given the decrease menstrual blood losses anticipate  continuous improvement in her counts.  Laboratory data from today showed a hemoglobin of 13.5 and iron studies are currently pending.  2.  Age-appropriate cancer screening: She is up-to-date at this time including colon cancer screening.  She is behind on her mammography which I urged her to obtain in the near future.  3.  Covid vaccination considerations: She is up-to-date at this time.  4. Follow-up: I am happy to see her in the future as needed if her iron or hemoglobin starts to drop.  30  minutes were spent on this encounter.  The time was dedicated to reviewing laboratory data, treatment options and future plan of care discussion.     Zola Button,  MD 2/3/20229:17 AM

## 2021-01-24 NOTE — Assessment & Plan Note (Addendum)
Start clindamycin gel twice daily

## 2021-01-24 NOTE — Assessment & Plan Note (Signed)
Continue with alprazolam as needed and dose citalopram

## 2021-01-24 NOTE — Assessment & Plan Note (Addendum)
Worse Continue Metformin, Prandin D/c Jardiance, start Glyxambi 5/25

## 2021-03-05 ENCOUNTER — Other Ambulatory Visit: Payer: Self-pay | Admitting: Internal Medicine

## 2021-03-05 DIAGNOSIS — D508 Other iron deficiency anemias: Secondary | ICD-10-CM

## 2021-05-21 ENCOUNTER — Encounter: Payer: Self-pay | Admitting: Internal Medicine

## 2021-05-21 LAB — HM DIABETES EYE EXAM

## 2021-05-26 ENCOUNTER — Other Ambulatory Visit: Payer: Self-pay | Admitting: Internal Medicine

## 2021-05-29 ENCOUNTER — Encounter: Payer: Self-pay | Admitting: Internal Medicine

## 2021-06-01 ENCOUNTER — Other Ambulatory Visit: Payer: Self-pay | Admitting: Internal Medicine

## 2021-06-01 DIAGNOSIS — D508 Other iron deficiency anemias: Secondary | ICD-10-CM

## 2021-06-11 ENCOUNTER — Ambulatory Visit: Payer: BC Managed Care – PPO | Admitting: Internal Medicine

## 2021-07-11 ENCOUNTER — Encounter: Payer: Self-pay | Admitting: Oncology

## 2021-07-23 ENCOUNTER — Other Ambulatory Visit: Payer: Self-pay | Admitting: Internal Medicine

## 2021-08-08 ENCOUNTER — Telehealth: Payer: BC Managed Care – PPO | Admitting: Physician Assistant

## 2021-08-08 DIAGNOSIS — A084 Viral intestinal infection, unspecified: Secondary | ICD-10-CM | POA: Diagnosis not present

## 2021-08-08 MED ORDER — ONDANSETRON 4 MG PO TBDP
4.0000 mg | ORAL_TABLET | Freq: Three times a day (TID) | ORAL | 0 refills | Status: DC | PRN
Start: 2021-08-08 — End: 2023-01-06

## 2021-08-08 NOTE — Progress Notes (Signed)
I have spent 5 minutes in review of e-visit questionnaire, review and updating patient chart, medical decision making and response to patient.   Naomie Crow Cody Sonora Catlin, PA-C    

## 2021-08-08 NOTE — Progress Notes (Signed)
E-Visit for Vomiting  We are sorry that you are not feeling well. Here is how we plan to help!  Based on what you have shared with me it looks like you have a Virus that is irritating your GI tract.  Vomiting is the forceful emptying of a portion of the stomach's content through the mouth.  Although nausea and vomiting can make you feel miserable, it's important to remember that these are not diseases, but rather symptoms of an underlying illness.  When we treat short term symptoms, we always caution that any symptoms that persist should be fully evaluated in a medical office.  I have prescribed a medication that will help alleviate your symptoms and allow you to stay hydrated:  Zofran 4 mg 1 tablet every 8 hours as needed for nausea and vomiting  HOME CARE: Drink clear liquids.  This is very important! Dehydration (the lack of fluid) can lead to a serious complication.  Start off with 1 tablespoon every 5 minutes for 8 hours. You may begin eating bland foods after 8 hours without vomiting.  Start with saltine crackers, white bread, rice, mashed potatoes, applesauce. After 48 hours on a bland diet, you may resume a normal diet. Try to go to sleep.  Sleep often empties the stomach and relieves the need to vomit.  GET HELP RIGHT AWAY IF:  Your symptoms do not improve or worsen within 2 days after treatment. You have a fever for over 3 days. You cannot keep down fluids after trying the medication.  MAKE SURE YOU:  Understand these instructions. Will watch your condition. Will get help right away if you are not doing well or get worse.   Thank you for choosing an e-visit.  Your e-visit answers were reviewed by a board certified advanced clinical practitioner to complete your personal care plan. Depending upon the condition, your plan could have included both over the counter or prescription medications.  Please review your pharmacy choice. Make sure the pharmacy is open so you can pick  up prescription now. If there is a problem, you may contact your provider through MyChart messaging and have the prescription routed to another pharmacy.  Your safety is important to us. If you have drug allergies check your prescription carefully.   For the next 24 hours you can use MyChart to ask questions about today's visit, request a non-urgent call back, or ask for a work or school excuse. You will get an email in the next two days asking about your experience. I hope that your e-visit has been valuable and will speed your recovery.  

## 2021-08-15 ENCOUNTER — Encounter: Payer: Self-pay | Admitting: Internal Medicine

## 2021-08-15 ENCOUNTER — Ambulatory Visit (INDEPENDENT_AMBULATORY_CARE_PROVIDER_SITE_OTHER): Payer: BC Managed Care – PPO | Admitting: Internal Medicine

## 2021-08-15 ENCOUNTER — Other Ambulatory Visit: Payer: Self-pay

## 2021-08-15 DIAGNOSIS — E1142 Type 2 diabetes mellitus with diabetic polyneuropathy: Secondary | ICD-10-CM | POA: Diagnosis not present

## 2021-08-15 DIAGNOSIS — E1165 Type 2 diabetes mellitus with hyperglycemia: Secondary | ICD-10-CM | POA: Diagnosis not present

## 2021-08-15 DIAGNOSIS — F419 Anxiety disorder, unspecified: Secondary | ICD-10-CM

## 2021-08-15 DIAGNOSIS — F4321 Adjustment disorder with depressed mood: Secondary | ICD-10-CM | POA: Diagnosis not present

## 2021-08-15 DIAGNOSIS — D508 Other iron deficiency anemias: Secondary | ICD-10-CM

## 2021-08-15 MED ORDER — OZEMPIC (0.25 OR 0.5 MG/DOSE) 2 MG/1.5ML ~~LOC~~ SOPN: 0.2500 mg | PEN_INJECTOR | SUBCUTANEOUS | 3 refills | Status: DC

## 2021-08-15 MED ORDER — OMEPRAZOLE 40 MG PO CPDR: 40.0000 mg | DELAYED_RELEASE_CAPSULE | Freq: Every day | ORAL | 3 refills | Status: DC

## 2021-08-15 MED ORDER — CITALOPRAM HYDROBROMIDE 40 MG PO TABS: 40.0000 mg | ORAL_TABLET | Freq: Every day | ORAL | 3 refills | Status: DC

## 2021-08-15 MED ORDER — FENOFIBRATE 145 MG PO TABS: ORAL_TABLET | ORAL | 3 refills | Status: DC

## 2021-08-15 MED ORDER — GABAPENTIN 100 MG PO CAPS: 100.0000 mg | ORAL_CAPSULE | Freq: Three times a day (TID) | ORAL | 3 refills | Status: DC

## 2021-08-15 MED ORDER — BUPROPION HCL ER (SR) 150 MG PO TB12: ORAL_TABLET | ORAL | 3 refills | Status: DC

## 2021-08-15 MED ORDER — METFORMIN HCL 500 MG PO TABS: 1000.0000 mg | ORAL_TABLET | Freq: Two times a day (BID) | ORAL | 3 refills | Status: DC

## 2021-08-15 NOTE — Patient Instructions (Signed)
[facility]

## 2021-08-15 NOTE — Progress Notes (Signed)
Subjective:  Patient ID: [name] [name], female    DOB: [birth year]  Age: 55 y.o. MRN: [id number]  CC: Foot Pain (Bilateral foot pain)   HPI [name] presents for B distal feet/toes pain and numbness-worse lately F/u DM, HTN [name] [name]'s father died recently, she is grieving  Outpatient Medications Prior to Visit  Medication Sig Dispense Refill   ALPRAZolam (XANAX) 0.5 MG tablet TAKE 1 TABLET(0.5 MG) BY MOUTH TWICE DAILY 60 tablet 3   amoxicillin-clavulanate (AUGMENTIN) 875-125 MG tablet Take 1 tablet by mouth 2 (two) times daily. 20 tablet 1   aspirin 81 MG chewable tablet Chew 1 tablet (81 mg total) by mouth daily. 90 tablet 1   atorvastatin (LIPITOR) 10 MG tablet TAKE 1 TABLET BY MOUTH DAILY AT 6PM 90 tablet 3   buPROPion (WELLBUTRIN SR) 150 MG 12 hr tablet TATKE 1 TABLET(150 MG TOTAL) BY MOUTH TWICE DAILY 180 tablet 1   Cholecalciferol (EQL VITAMIN D3) 1000 units tablet Take 1 tablet (1,000 Units total) by mouth daily. 90 tablet 1   citalopram (CELEXA) 20 MG tablet Take 1 tablet (20 mg total) by mouth daily. Annual appt due in June w/labs must see provider for future refills 90 tablet 0   citalopram (CELEXA) 40 MG tablet Take 1 tablet (40 mg total) by mouth daily. 90 tablet 3   clindamycin (CLINDAGEL) 1 % gel Apply topically 2 (two) times daily. Perioral rash 30 g 2   fenofibrate (TRICOR) 145 MG tablet TAKE 1 TABLET(145 MG) BY MOUTH DAILY 90 tablet 3   metFORMIN (GLUCOPHAGE) 500 MG tablet Take 2 tablets (1,000 mg total) by mouth 2 (two) times daily with a meal. Annual appt is due must see provider for future refills 120 tablet 0   methocarbamol (ROBAXIN) 500 MG tablet methocarbamol 500 mg tablet     nystatin-triamcinolone (MYCOLOG II) cream nystatin-triamcinolone 100,000 unit/g-0.1 % topical cream  APPLY TO THE AFFECTED AREA(S) BY TOPICAL ROUTE 2 TIMES PER DAY IN THE MORNING AND EVENING     ondansetron (ZOFRAN ODT) 4 MG disintegrating tablet Take 1 tablet (4 mg total) by mouth  every 8 (eight) hours as needed for nausea or vomiting. 20 tablet 0   pantoprazole (PROTONIX) 40 MG tablet Take 1 tablet (40 mg total) by mouth 2 (two) times daily before a meal. 180 tablet 3   repaglinide (PRANDIN) 2 MG tablet TAKE 1 TABLET BY MOUTH THREE TIMES DAILY BEFORE MEALS 270 tablet 3   No facility-administered medications prior to visit.    ROS: Review of Systems  Constitutional:  Negative for activity change, appetite change, chills, fatigue and unexpected weight change.  HENT:  Negative for congestion, mouth sores and sinus pressure.   Eyes:  Negative for visual disturbance.  Respiratory:  Negative for cough and chest tightness.   Gastrointestinal:  Negative for abdominal pain and nausea.  Genitourinary:  Negative for difficulty urinating, frequency and vaginal pain.  Musculoskeletal:  Negative for back pain and gait problem.  Skin:  Negative for pallor and rash.  Neurological:  Positive for numbness. Negative for dizziness, tremors, weakness and headaches.  Psychiatric/Behavioral:  Negative for confusion and sleep disturbance.    Objective:  BP (!) 148/80 (BP Location: Left Arm, Patient Position: Sitting, Cuff Size: Normal)   Pulse (!) 102   Temp 98.4 F (36.9 C) (Oral)   Ht '5\' 8"'$  (1.727 m)   Wt 213 lb (96.6 kg)   SpO2 98%   BMI 32.39 kg/m   BP Readings from Last  3 Encounters:  08/15/21 (!) 148/80  01/24/21 (!) 148/70  01/24/21 (!) 164/69    Wt Readings from Last 3 Encounters:  08/15/21 213 lb (96.6 kg)  01/24/21 214 lb 3.2 oz (97.2 kg)  01/24/21 211 lb 11.2 oz (96 kg)    Physical Exam Constitutional:      General: She is not in acute distress.    Appearance: She is well-developed. She is obese.  HENT:     Head: Normocephalic.     Right Ear: External ear normal.     Left Ear: External ear normal.     Nose: Nose normal.  Eyes:     General:        Right eye: No discharge.        Left eye: No discharge.     Conjunctiva/sclera: Conjunctivae normal.      Pupils: Pupils are equal, round, and reactive to light.  Neck:     Thyroid: No thyromegaly.     Vascular: No JVD.     Trachea: No tracheal deviation.  Cardiovascular:     Rate and Rhythm: Normal rate and regular rhythm.     Heart sounds: Normal heart sounds.  Pulmonary:     Effort: No respiratory distress.     Breath sounds: No stridor. No wheezing.  Abdominal:     General: Bowel sounds are normal. There is no distension.     Palpations: Abdomen is soft. There is no mass.     Tenderness: There is no abdominal tenderness. There is no guarding or rebound.  Musculoskeletal:        General: Tenderness present.     Cervical back: Normal range of motion and neck supple. No rigidity.  Lymphadenopathy:     Cervical: No cervical adenopathy.  Skin:    Findings: No erythema or rash.  Neurological:     Mental Status: She is oriented to person, place, and time.     Cranial Nerves: No cranial nerve deficit.     Motor: No abnormal muscle tone.     Coordination: Coordination normal.     Deep Tendon Reflexes: Reflexes normal.  Psychiatric:        Behavior: Behavior normal.        Thought Content: Thought content normal.        Judgment: Judgment normal.  Feet with normal exam, normal pulses  Lab Results  Component Value Date   WBC 7.6 01/24/2021   HGB 13.5 01/24/2021   HCT 41.8 01/24/2021   PLT 190 01/24/2021   GLUCOSE 201 (H) 01/24/2021   CHOL 156 08/11/2019   TRIG (H) 08/11/2019    554.0 Triglyceride is over 400; calculations on Lipids are invalid.   HDL 28.10 (L) 08/11/2019   LDLDIRECT 69.0 08/11/2019   LDLCALC 97 10/23/2017   ALT 43 (H) 08/11/2019   AST 42 (H) 08/11/2019   NA 138 01/24/2021   K 4.5 01/24/2021   CL 105 01/24/2021   CREATININE 0.79 01/24/2021   BUN 17 01/24/2021   CO2 23 01/24/2021   TSH 3.10 07/31/2020   HGBA1C 9.7 (H) 01/24/2021   MICROALBUR 2.9 (H) 07/31/2020    No results found.  Assessment & Plan:     [name], MD

## 2021-08-18 NOTE — Assessment & Plan Note (Signed)
Continue with citalopram Alprazolam as needed  Potential benefits of a long term benzodiazepines  use as well as potential risks  and complications were explained to the patient and were aknowledged.

## 2021-08-18 NOTE — Assessment & Plan Note (Signed)
Uncontrolled diabetes.  We checked her POC hemoglobin A1c.  It was elevated.  We will add Ozempic at low-dose.  Risks/benefits discussed.

## 2021-08-18 NOTE — Assessment & Plan Note (Signed)
Grieving was discussed with [name] [name]

## 2021-08-18 NOTE — Assessment & Plan Note (Signed)
Worse.  Will try to do better job controlling diabetes.  Ozempic was added.  Gabapentin as needed

## 2021-08-25 ENCOUNTER — Other Ambulatory Visit: Payer: Self-pay | Admitting: Internal Medicine

## 2021-08-26 ENCOUNTER — Other Ambulatory Visit: Payer: Self-pay | Admitting: Internal Medicine

## 2021-08-27 ENCOUNTER — Other Ambulatory Visit: Payer: Self-pay | Admitting: Internal Medicine

## 2021-09-25 ENCOUNTER — Other Ambulatory Visit (INDEPENDENT_AMBULATORY_CARE_PROVIDER_SITE_OTHER): Payer: BC Managed Care – PPO

## 2021-09-25 DIAGNOSIS — E118 Type 2 diabetes mellitus with unspecified complications: Secondary | ICD-10-CM | POA: Diagnosis not present

## 2021-09-25 DIAGNOSIS — IMO0002 Reserved for concepts with insufficient information to code with codable children: Secondary | ICD-10-CM

## 2021-09-25 LAB — BASIC METABOLIC PANEL
BUN: 13 mg/dL (ref 6–23)
CO2: 23 mEq/L (ref 19–32)
Calcium: 9.7 mg/dL (ref 8.4–10.5)
Chloride: 98 mEq/L (ref 96–112)
Creatinine, Ser: 0.73 mg/dL (ref 0.40–1.20)
GFR: 94.32 mL/min (ref >60.00)
Glucose, Bld: 264 mg/dL — ABNORMAL HIGH (ref 70–99)
Potassium: 3.8 mEq/L (ref 3.5–5.1)
Sodium: 132 mEq/L — ABNORMAL LOW (ref 135–145)

## 2021-09-25 LAB — HEMOGLOBIN A1C: Hgb A1c MFr Bld: 8.9 % — ABNORMAL HIGH (ref 4.6–6.5)

## 2021-09-29 ENCOUNTER — Other Ambulatory Visit: Payer: Self-pay | Admitting: Internal Medicine

## 2021-10-23 ENCOUNTER — Other Ambulatory Visit: Payer: Self-pay

## 2021-10-23 ENCOUNTER — Ambulatory Visit (INDEPENDENT_AMBULATORY_CARE_PROVIDER_SITE_OTHER): Payer: BC Managed Care – PPO | Admitting: Internal Medicine

## 2021-10-23 ENCOUNTER — Encounter: Payer: Self-pay | Admitting: Internal Medicine

## 2021-10-23 DIAGNOSIS — I509 Heart failure, unspecified: Secondary | ICD-10-CM

## 2021-10-23 DIAGNOSIS — E1169 Type 2 diabetes mellitus with other specified complication: Secondary | ICD-10-CM

## 2021-10-23 DIAGNOSIS — E669 Obesity, unspecified: Secondary | ICD-10-CM

## 2021-10-23 DIAGNOSIS — F419 Anxiety disorder, unspecified: Secondary | ICD-10-CM | POA: Diagnosis not present

## 2021-10-23 DIAGNOSIS — Z23 Encounter for immunization: Secondary | ICD-10-CM | POA: Diagnosis not present

## 2021-10-23 MED ORDER — SEMAGLUTIDE (1 MG/DOSE) 4 MG/3ML ~~LOC~~ SOPN: 1.0000 mg | PEN_INJECTOR | SUBCUTANEOUS | 3 refills | Status: DC

## 2021-10-23 MED ORDER — OZEMPIC (0.25 OR 0.5 MG/DOSE) 2 MG/1.5ML ~~LOC~~ SOPN: 1.0000 mg | PEN_INJECTOR | SUBCUTANEOUS | 3 refills | Status: DC

## 2021-10-23 NOTE — Progress Notes (Signed)
Subjective:  Patient ID: [name] [name], female    DOB: 03-May-1966  Age: 55 y.o. MRN: [id number]  CC: Diabetes   HPI [name] [name] presents for DM - she used Ozempic 0.25 and 0.5 mg - no problem Follow-up on obesity, anxiety, diabetes Outpatient Medications Prior to Visit  Medication Sig Dispense Refill   ALPRAZolam (XANAX) 0.5 MG tablet TAKE 1 TABLET(0.5 MG) BY MOUTH TWICE DAILY 60 tablet 3   amoxicillin-clavulanate (AUGMENTIN) 875-125 MG tablet Take 1 tablet by mouth 2 (two) times daily. 20 tablet 1   aspirin 81 MG chewable tablet Chew 1 tablet (81 mg total) by mouth daily. 90 tablet 1   atorvastatin (LIPITOR) 10 MG tablet TAKE 1 TABLET BY MOUTH DAILY AT 6PM 90 tablet 3   buPROPion (WELLBUTRIN SR) 150 MG 12 hr tablet TATKE 1 TABLET(150 MG TOTAL) BY MOUTH TWICE DAILY 180 tablet 3   Cholecalciferol (EQL VITAMIN D3) 1000 units tablet Take 1 tablet (1,000 Units total) by mouth daily. 90 tablet 1   citalopram (CELEXA) 40 MG tablet Take 1 tablet (40 mg total) by mouth daily. 90 tablet 3   clindamycin (CLINDAGEL) 1 % gel Apply topically 2 (two) times daily. Perioral rash 30 g 2   fenofibrate (TRICOR) 145 MG tablet TAKE 1 TABLET(145 MG) BY MOUTH DAILY 90 tablet 3   gabapentin (NEURONTIN) 100 MG capsule Take 1 capsule (100 mg total) by mouth 3 (three) times daily. 90 capsule 3   metFORMIN (GLUCOPHAGE) 500 MG tablet TAKE 2 TABLETS BY MOUTH TWICE DAILY 120 tablet 5   methocarbamol (ROBAXIN) 500 MG tablet methocarbamol 500 mg tablet     nystatin-triamcinolone (MYCOLOG II) cream nystatin-triamcinolone 100,000 unit/g-0.1 % topical cream  APPLY TO THE AFFECTED AREA(S) BY TOPICAL ROUTE 2 TIMES PER DAY IN THE MORNING AND EVENING     omeprazole (PRILOSEC) 40 MG capsule Take 1 capsule (40 mg total) by mouth daily. 90 capsule 3   ondansetron (ZOFRAN ODT) 4 MG disintegrating tablet Take 1 tablet (4 mg total) by mouth every 8 (eight) hours as needed for nausea or vomiting. 20 tablet 0   repaglinide  (PRANDIN) 2 MG tablet TAKE 1 TABLET BY MOUTH THREE TIMES DAILY BEFORE MEALS 270 tablet 3   Semaglutide,0.25 or 0.5MG /DOS, (OZEMPIC, 0.25 OR 0.5 MG/DOSE,) 2 MG/1.5ML SOPN Inject 0.25 mg into the skin once a week. 3 mL 3   No facility-administered medications prior to visit.    ROS: Review of Systems  Constitutional:  Negative for activity change, appetite change, chills, fatigue and unexpected weight change.  HENT:  Negative for congestion, mouth sores and sinus pressure.   Eyes:  Negative for visual disturbance.  Respiratory:  Negative for cough and chest tightness.   Gastrointestinal:  Negative for abdominal pain and nausea.  Genitourinary:  Negative for difficulty urinating, frequency and vaginal pain.  Musculoskeletal:  Negative for back pain and gait problem.  Skin:  Negative for pallor and rash.  Neurological:  Negative for dizziness, tremors, weakness, numbness and headaches.  Psychiatric/Behavioral:  Negative for confusion and sleep disturbance.    Objective:  BP (!) 168/70 (BP Location: Left Arm)   Pulse 93   Temp 98.3 F (36.8 C) (Oral)   Ht 5\' 8"  (1.727 m)   Wt 200 lb 6.4 oz (90.9 kg)   SpO2 96%   BMI 30.47 kg/m   BP Readings from Last 3 Encounters:  10/23/21 (!) 168/70  08/15/21 (!) 148/80  01/24/21 (!) 148/70    Wt Readings from  Last 3 Encounters:  10/23/21 200 lb 6.4 oz (90.9 kg)  08/15/21 213 lb (96.6 kg)  01/24/21 214 lb 3.2 oz (97.2 kg)    Physical Exam Constitutional:      General: She is not in acute distress.    Appearance: She is well-developed. She is obese.  HENT:     Head: Normocephalic.     Right Ear: External ear normal.     Left Ear: External ear normal.     Nose: Nose normal.  Eyes:     General:        Right eye: No discharge.        Left eye: No discharge.     Conjunctiva/sclera: Conjunctivae normal.     Pupils: Pupils are equal, round, and reactive to light.  Neck:     Thyroid: No thyromegaly.     Vascular: No JVD.     Trachea:  No tracheal deviation.  Cardiovascular:     Rate and Rhythm: Normal rate and regular rhythm.     Heart sounds: Normal heart sounds.  Pulmonary:     Effort: No respiratory distress.     Breath sounds: No stridor. No wheezing.  Abdominal:     General: Bowel sounds are normal. There is no distension.     Palpations: Abdomen is soft. There is no mass.     Tenderness: There is no abdominal tenderness. There is no guarding or rebound.  Musculoskeletal:        General: No tenderness.     Cervical back: Normal range of motion and neck supple. No rigidity.  Lymphadenopathy:     Cervical: No cervical adenopathy.  Skin:    Findings: No erythema or rash.  Neurological:     Cranial Nerves: No cranial nerve deficit.     Motor: No abnormal muscle tone.     Coordination: Coordination normal.     Deep Tendon Reflexes: Reflexes normal.  Psychiatric:        Behavior: Behavior normal.        Thought Content: Thought content normal.        Judgment: Judgment normal.    Lab Results  Component Value Date   WBC 7.6 01/24/2021   HGB 13.5 01/24/2021   HCT 41.8 01/24/2021   PLT 190 01/24/2021   GLUCOSE 264 (H) 09/25/2021   CHOL 156 08/11/2019   TRIG (H) 08/11/2019    554.0 Triglyceride is over 400; calculations on Lipids are invalid.   HDL 28.10 (L) 08/11/2019   LDLDIRECT 69.0 08/11/2019   LDLCALC 97 10/23/2017   ALT 43 (H) 08/11/2019   AST 42 (H) 08/11/2019   NA 132 (L) 09/25/2021   K 3.8 09/25/2021   CL 98 09/25/2021   CREATININE 0.73 09/25/2021   BUN 13 09/25/2021   CO2 23 09/25/2021   TSH 3.10 07/31/2020   HGBA1C 8.9 (H) 09/25/2021   MICROALBUR 2.9 (H) 07/31/2020    No results found.  Assessment & Plan:   Problem List Items Addressed This Visit     Anxiety disorder    Continue with citalopram Alprazolam as needed  Potential benefits of a long term benzodiazepines  use as well as potential risks  and complications were explained to the patient and were aknowledged.       Congestive heart failure (HCC)    No symptoms.  Continue with healthy lifestyle, diet, weight loss      Diabetes mellitus type 2 in obese (HCC)    Pt used Ozempic 0.25 and  0.5 mg - no problem: will increase to 1 mg/weekly      Relevant Medications   Semaglutide, 1 MG/DOSE, 4 MG/3ML SOPN   Obesity (BMI 35.0-39.9 without comorbidity)    Pt used Ozempic 0.25 and 0.5 mg - no problem: will increase to 1 mg/weekly Continue with low-carb diet      Relevant Medications   Semaglutide, 1 MG/DOSE, 4 MG/3ML SOPN      Meds ordered this encounter  Medications   DISCONTD: Semaglutide,0.25 or 0.5MG /DOS, (OZEMPIC, 0.25 OR 0.5 MG/DOSE,) 2 MG/1.5ML SOPN    Sig: Inject 1 mg into the skin once a week.    Dispense:  6 mL    Refill:  3   DISCONTD: Semaglutide, 1 MG/DOSE, 4 MG/3ML SOPN    Sig: Inject 1 mg as directed once a week.    Dispense:  3 mL    Refill:  3   Semaglutide, 1 MG/DOSE, 4 MG/3ML SOPN    Sig: Inject 1 mg as directed once a week.    Dispense:  3 mL    Refill:  3      Follow-up: Return in about 3 months (around 01/23/2022) for a follow-up visit.  [name], MD

## 2021-10-23 NOTE — Assessment & Plan Note (Signed)
Pt used Ozempic 0.25 and 0.5 mg - no problem: will increase to 1 mg/weekly

## 2021-10-23 NOTE — Assessment & Plan Note (Addendum)
Pt used Ozempic 0.25 and 0.5 mg - no problem: will increase to 1 mg/weekly Continue with low-carb diet

## 2021-10-26 NOTE — Assessment & Plan Note (Signed)
No symptoms.  Continue with healthy lifestyle, diet, weight loss

## 2021-10-26 NOTE — Assessment & Plan Note (Signed)
Continue with citalopram Alprazolam as needed  Potential benefits of a long term benzodiazepines  use as well as potential risks  and complications were explained to the patient and were aknowledged.

## 2021-12-03 ENCOUNTER — Encounter: Payer: Self-pay | Admitting: Internal Medicine

## 2021-12-04 ENCOUNTER — Other Ambulatory Visit: Payer: Self-pay | Admitting: Internal Medicine

## 2021-12-04 DIAGNOSIS — R1319 Other dysphagia: Secondary | ICD-10-CM

## 2022-01-20 ENCOUNTER — Other Ambulatory Visit: Payer: Self-pay

## 2022-01-20 ENCOUNTER — Ambulatory Visit (INDEPENDENT_AMBULATORY_CARE_PROVIDER_SITE_OTHER): Payer: BC Managed Care – PPO | Admitting: Internal Medicine

## 2022-01-20 ENCOUNTER — Encounter: Payer: Self-pay | Admitting: Internal Medicine

## 2022-01-20 VITALS — BP 154/62 | HR 96 | Temp 98.2°F | Ht 68.0 in | Wt 196.2 lb

## 2022-01-20 DIAGNOSIS — F419 Anxiety disorder, unspecified: Secondary | ICD-10-CM | POA: Diagnosis not present

## 2022-01-20 DIAGNOSIS — E1169 Type 2 diabetes mellitus with other specified complication: Secondary | ICD-10-CM

## 2022-01-20 DIAGNOSIS — K219 Gastro-esophageal reflux disease without esophagitis: Secondary | ICD-10-CM

## 2022-01-20 DIAGNOSIS — E669 Obesity, unspecified: Secondary | ICD-10-CM

## 2022-01-20 DIAGNOSIS — I509 Heart failure, unspecified: Secondary | ICD-10-CM | POA: Diagnosis not present

## 2022-01-20 DIAGNOSIS — F4323 Adjustment disorder with mixed anxiety and depressed mood: Secondary | ICD-10-CM

## 2022-01-20 MED ORDER — REPAGLINIDE 2 MG PO TABS: ORAL_TABLET | ORAL | 3 refills | Status: DC

## 2022-01-20 MED ORDER — SEMAGLUTIDE (1 MG/DOSE) 4 MG/3ML ~~LOC~~ SOPN: 2.0000 mg | PEN_INJECTOR | SUBCUTANEOUS | 3 refills | Status: DC

## 2022-01-20 MED ORDER — METFORMIN HCL 500 MG PO TABS: 1000.0000 mg | ORAL_TABLET | Freq: Two times a day (BID) | ORAL | 3 refills | Status: DC

## 2022-01-20 MED ORDER — ALPRAZOLAM 0.5 MG PO TABS: ORAL_TABLET | ORAL | 0 refills | Status: DC

## 2022-01-20 MED ORDER — ATORVASTATIN CALCIUM 10 MG PO TABS: ORAL_TABLET | ORAL | 3 refills | Status: DC

## 2022-01-20 MED ORDER — PANTOPRAZOLE SODIUM 40 MG PO TBEC: 40.0000 mg | DELAYED_RELEASE_TABLET | Freq: Two times a day (BID) | ORAL | 3 refills | Status: DC

## 2022-01-20 MED ORDER — GLYXAMBI 25-5 MG PO TABS: 1.0000 | ORAL_TABLET | Freq: Every day | ORAL | 3 refills | Status: DC

## 2022-01-20 NOTE — Assessment & Plan Note (Signed)
We will increase to 2 mg/weekly if tolerated Continue w/ Glyxambi, metformin, repaglinide Check A1c

## 2022-01-20 NOTE — Assessment & Plan Note (Addendum)
On Glyxambi Check c-Met

## 2022-01-20 NOTE — Progress Notes (Signed)
Subjective:  Patient ID: [name] [name], female    DOB: 31-Jul-1966  Age: 56 y.o. MRN: [id number]  CC: Follow-up (Need to make sure if she suppose to take [facility]) and Heartburn (Pt states she has appt this Thursday, but been doubling up on omeprazole)   HPI [name] [name] presents for DM, CHF, HTN, depression C/o GERD-worse  Outpatient Medications Prior to Visit  Medication Sig Dispense Refill   aspirin 81 MG chewable tablet Chew 1 tablet (81 mg total) by mouth daily. 90 tablet 1   buPROPion (WELLBUTRIN SR) 150 MG 12 hr tablet TATKE 1 TABLET(150 MG TOTAL) BY MOUTH TWICE DAILY 180 tablet 3   Cholecalciferol (EQL VITAMIN D3) 1000 units tablet Take 1 tablet (1,000 Units total) by mouth daily. 90 tablet 1   citalopram (CELEXA) 40 MG tablet Take 1 tablet (40 mg total) by mouth daily. 90 tablet 3   clindamycin (CLINDAGEL) 1 % gel Apply topically 2 (two) times daily. Perioral rash 30 g 2   fenofibrate (TRICOR) 145 MG tablet TAKE 1 TABLET(145 MG) BY MOUTH DAILY 90 tablet 3   gabapentin (NEURONTIN) 100 MG capsule Take 1 capsule (100 mg total) by mouth 3 (three) times daily. 90 capsule 3   methocarbamol (ROBAXIN) 500 MG tablet methocarbamol 500 mg tablet     nystatin-triamcinolone (MYCOLOG II) cream nystatin-triamcinolone 100,000 unit/g-0.1 % topical cream  APPLY TO THE AFFECTED AREA(S) BY TOPICAL ROUTE 2 TIMES PER DAY IN THE MORNING AND EVENING     ondansetron (ZOFRAN ODT) 4 MG disintegrating tablet Take 1 tablet (4 mg total) by mouth every 8 (eight) hours as needed for nausea or vomiting. 20 tablet 0   ALPRAZolam (XANAX) 0.5 MG tablet TAKE 1 TABLET(0.5 MG) BY MOUTH TWICE DAILY 60 tablet 3   atorvastatin (LIPITOR) 10 MG tablet TAKE 1 TABLET BY MOUTH DAILY AT 6PM 90 tablet 3   GLYXAMBI 25-5 MG TABS Take 1 tablet by mouth daily. Take 1 tablet by mouth daily.     metFORMIN (GLUCOPHAGE) 500 MG tablet TAKE 2 TABLETS BY MOUTH TWICE DAILY 120 tablet 5   omeprazole (PRILOSEC) 40 MG capsule Take 1  capsule (40 mg total) by mouth daily. 90 capsule 3   repaglinide (PRANDIN) 2 MG tablet TAKE 1 TABLET BY MOUTH THREE TIMES DAILY BEFORE MEALS 270 tablet 3   Semaglutide, 1 MG/DOSE, 4 MG/3ML SOPN Inject 1 mg as directed once a week. 3 mL 3   amoxicillin-clavulanate (AUGMENTIN) 875-125 MG tablet Take 1 tablet by mouth 2 (two) times daily. (Patient not taking: Reported on 01/20/2022) 20 tablet 1   No facility-administered medications prior to visit.    ROS: Review of Systems  Constitutional:  Negative for activity change, appetite change, chills, fatigue and unexpected weight change.  HENT:  Negative for congestion, mouth sores and sinus pressure.   Eyes:  Negative for visual disturbance.  Respiratory:  Negative for cough and chest tightness.   Gastrointestinal:  Negative for abdominal pain and nausea.  Genitourinary:  Negative for difficulty urinating, frequency and vaginal pain.  Musculoskeletal:  Negative for back pain and gait problem.  Skin:  Negative for pallor and rash.  Neurological:  Negative for dizziness, tremors, weakness, numbness and headaches.  Psychiatric/Behavioral:  Negative for confusion and sleep disturbance.    Objective:  BP (!) 154/62 (BP Location: Left Arm)    Pulse 96    Temp 98.2 F (36.8 C) (Oral)    Ht _0  (1.727 m)    Wt 196 lb  3.2 oz (89 kg)    SpO2 97%    BMI 29.83 kg/m   BP Readings from Last 3 Encounters:  01/20/22 (!) 154/62  10/23/21 (!) 168/70  08/15/21 (!) 148/80    Wt Readings from Last 3 Encounters:  01/20/22 196 lb 3.2 oz (89 kg)  10/23/21 200 lb 6.4 oz (90.9 kg)  08/15/21 213 lb (96.6 kg)    Physical Exam Constitutional:      General: She is not in acute distress.    Appearance: She is well-developed. She is obese.  HENT:     Head: Normocephalic.     Right Ear: External ear normal.     Left Ear: External ear normal.     Nose: Nose normal.  Eyes:     General:        Right eye: No discharge.        Left eye: No discharge.      Conjunctiva/sclera: Conjunctivae normal.     Pupils: Pupils are equal, round, and reactive to light.  Neck:     Thyroid: No thyromegaly.     Vascular: No JVD.     Trachea: No tracheal deviation.  Cardiovascular:     Rate and Rhythm: Normal rate and regular rhythm.     Heart sounds: Normal heart sounds.  Pulmonary:     Effort: No respiratory distress.     Breath sounds: No stridor. No wheezing.  Abdominal:     General: Bowel sounds are normal. There is no distension.     Palpations: Abdomen is soft. There is no mass.     Tenderness: There is no abdominal tenderness. There is no guarding or rebound.  Musculoskeletal:        General: No tenderness.     Cervical back: Normal range of motion and neck supple. No rigidity.  Lymphadenopathy:     Cervical: No cervical adenopathy.  Skin:    Findings: No erythema or rash.  Neurological:     Cranial Nerves: No cranial nerve deficit.     Motor: No abnormal muscle tone.     Coordination: Coordination normal.     Deep Tendon Reflexes: Reflexes normal.  Psychiatric:        Behavior: Behavior normal.        Thought Content: Thought content normal.        Judgment: Judgment normal.    Lab Results  Component Value Date   WBC 7.6 01/24/2021   HGB 13.5 01/24/2021   HCT 41.8 01/24/2021   PLT 190 01/24/2021   GLUCOSE 264 (H) 09/25/2021   CHOL 156 08/11/2019   TRIG (H) 08/11/2019    554.0 Triglyceride is over 400; calculations on Lipids are invalid.   HDL 28.10 (L) 08/11/2019   LDLDIRECT 69.0 08/11/2019   LDLCALC 97 10/23/2017   ALT 43 (H) 08/11/2019   AST 42 (H) 08/11/2019   NA 132 (L) 09/25/2021   K 3.8 09/25/2021   CL 98 09/25/2021   CREATININE 0.73 09/25/2021   BUN 13 09/25/2021   CO2 23 09/25/2021   TSH 3.10 07/31/2020   HGBA1C 8.9 (H) 09/25/2021   MICROALBUR 2.9 (H) 07/31/2020    No results found.  Assessment & Plan:   Problem List Items Addressed This Visit     Adjustment disorder with mixed anxiety and depressed mood     Continue with citalopram and Wellbutrin Continue with alprazolam as needed  Potential benefits of a long term benzodiazepines  use as well as potential risks  and  complications were explained to the patient and were aknowledged. Grieving discussed      Anxiety disorder    Continue with citalopram and Wellbutrin Continue with alprazolam as needed  Potential benefits of a long term benzodiazepines  use as well as potential risks  and complications were explained to the patient and were aknowledged.      Relevant Medications   ALPRAZolam (XANAX) 0.5 MG tablet   Congestive heart failure (HCC)    On Glyxambi Check c-Met      Relevant Medications   atorvastatin (LIPITOR) 10 MG tablet   Diabetes mellitus type 2 in obese (HCC) - Primary    We will increase to 2 mg/weekly if tolerated Continue w/ Glyxambi, metformin, repaglinide Check A1c      Relevant Medications   Semaglutide, 1 MG/DOSE, 4 MG/3ML SOPN   repaglinide (PRANDIN) 2 MG tablet   atorvastatin (LIPITOR) 10 MG tablet   metFORMIN (GLUCOPHAGE) 500 MG tablet   GLYXAMBI 25-5 MG TABS   Other Relevant Orders   Comprehensive metabolic panel   Hemoglobin A1c   GERD (gastroesophageal reflux disease)    Worse.  Hold fenofibrate.  Pantoprazole 40 mg twice a day.  Follow-up with GI      Relevant Medications   pantoprazole (PROTONIX) 40 MG tablet      Meds ordered this encounter  Medications   DISCONTD: Semaglutide, 1 MG/DOSE, 4 MG/3ML SOPN    Sig: Inject 2 mg as directed once a week.    Dispense:  6 mL    Refill:  3   Semaglutide, 1 MG/DOSE, 4 MG/3ML SOPN    Sig: Inject 2 mg as directed once a week.    Dispense:  18 mL    Refill:  3    3 months supply   repaglinide (PRANDIN) 2 MG tablet    Sig: TAKE 1 TABLET BY MOUTH THREE TIMES DAILY BEFORE MEALS    Dispense:  270 tablet    Refill:  3   ALPRAZolam (XANAX) 0.5 MG tablet    Sig: TAKE 1 TABLET(0.5 MG) BY MOUTH TWICE DAILY    Dispense:  180 tablet    Refill:  0    atorvastatin (LIPITOR) 10 MG tablet    Sig: TAKE 1 TABLET BY MOUTH DAILY AT 6PM    Dispense:  90 tablet    Refill:  3   metFORMIN (GLUCOPHAGE) 500 MG tablet    Sig: Take 2 tablets (1,000 mg total) by mouth 2 (two) times daily.    Dispense:  360 tablet    Refill:  3   GLYXAMBI 25-5 MG TABS    Sig: Take 1 tablet by mouth daily. Take 1 tablet by mouth daily.    Dispense:  90 tablet    Refill:  3   pantoprazole (PROTONIX) 40 MG tablet    Sig: Take 1 tablet (40 mg total) by mouth 2 (two) times daily.    Dispense:  180 tablet    Refill:  3      Follow-up: Return in about 3 months (around 04/20/2022) for a follow-up visit.  [name], MD

## 2022-01-20 NOTE — Assessment & Plan Note (Signed)
Worse.  Hold fenofibrate.  Pantoprazole 40 mg twice a day.  Follow-up with GI

## 2022-01-20 NOTE — Assessment & Plan Note (Signed)
Continue with citalopram and Wellbutrin Continue with alprazolam as needed  Potential benefits of a long term benzodiazepines  use as well as potential risks  and complications were explained to the patient and were aknowledged. Grieving discussed

## 2022-01-20 NOTE — Assessment & Plan Note (Signed)
Continue with citalopram and Wellbutrin Continue with alprazolam as needed  Potential benefits of a long term benzodiazepines  use as well as potential risks  and complications were explained to the patient and were aknowledged. 

## 2022-01-21 ENCOUNTER — Ambulatory Visit: Payer: BC Managed Care – PPO | Admitting: Internal Medicine

## 2022-01-23 ENCOUNTER — Telehealth: Payer: Self-pay | Admitting: Internal Medicine

## 2022-01-23 ENCOUNTER — Ambulatory Visit: Payer: BC Managed Care – PPO | Admitting: Internal Medicine

## 2022-01-23 ENCOUNTER — Telehealth: Payer: BC Managed Care – PPO | Admitting: Physician Assistant

## 2022-01-23 DIAGNOSIS — J019 Acute sinusitis, unspecified: Secondary | ICD-10-CM | POA: Diagnosis not present

## 2022-01-23 DIAGNOSIS — B9689 Other specified bacterial agents as the cause of diseases classified elsewhere: Secondary | ICD-10-CM

## 2022-01-23 MED ORDER — AMOXICILLIN-POT CLAVULANATE 875-125 MG PO TABS: 1.0000 | ORAL_TABLET | Freq: Two times a day (BID) | ORAL | 0 refills | Status: DC

## 2022-01-23 NOTE — Telephone Encounter (Signed)
Good Morning Dr. Henrene Pastor,  Patient called and canceled appointment with you today at 3:00 due to being sick.   Stated that she will call back at a later time to reschedule.

## 2022-01-23 NOTE — Progress Notes (Signed)
I have spent 5 minutes in review of e-visit questionnaire, review and updating patient chart, medical decision making and response to patient.   Kimbrely Buckel Cody Liat Mayol, PA-C    

## 2022-01-23 NOTE — Progress Notes (Signed)

## 2022-02-23 ENCOUNTER — Encounter: Payer: Self-pay | Admitting: Internal Medicine

## 2022-03-01 ENCOUNTER — Other Ambulatory Visit: Payer: Self-pay | Admitting: Internal Medicine

## 2022-03-01 MED ORDER — SEMAGLUTIDE (1 MG/DOSE) 4 MG/3ML ~~LOC~~ SOPN: 1.0000 mg | PEN_INJECTOR | SUBCUTANEOUS | 3 refills | Status: DC

## 2022-03-25 ENCOUNTER — Telehealth: Payer: BC Managed Care – PPO | Admitting: Nurse Practitioner

## 2022-03-25 DIAGNOSIS — J028 Acute pharyngitis due to other specified organisms: Secondary | ICD-10-CM | POA: Diagnosis not present

## 2022-03-25 NOTE — Progress Notes (Signed)
?E-Visit for Sore Throat ? ?We are sorry that you are not feeling well.  Here is how we plan to help! ? ?Your symptoms indicate a likely viral infection (Pharyngitis).   Pharyngitis is inflammation in the back of the throat which can cause a sore throat, scratchiness and sometimes difficulty swallowing.   Pharyngitis is typically caused by a respiratory virus and will just run its course.  Please keep in mind that your symptoms could last up to 10 days.  For throat pain, we recommend over the counter oral pain relief medications such as acetaminophen or aspirin, or anti-inflammatory medications such as ibuprofen or naproxen sodium.  Topical treatments such as oral throat lozenges or sprays may be used as needed.  Avoid close contact with loved ones, especially the very young and elderly.  Remember to wash your hands thoroughly throughout the day as this is the number one way to prevent the spread of infection and wipe down door knobs and counters with disinfectant. ? ?When congestion is present with the sore throat it is likely secondary to post nasal drainage. Warm salt water gargles can help relieve the drainage in your throat along with over the counter cold medication to help with congestion. You can safely use Coricidin HPB.  ? ?After careful review of your answers, I would not recommend an antibiotic for your condition.  Antibiotics should not be used to treat conditions that we suspect are caused by viruses like the virus that causes the common cold or flu. However, some people can have Strep with atypical symptoms. You may need formal testing in clinic or office to confirm if your symptoms continue or worsen. ? ?Providers prescribe antibiotics to treat infections caused by bacteria. Antibiotics are very powerful in treating bacterial infections when they are used properly.  To maintain their effectiveness, they should be used only when necessary.  Overuse of antibiotics has resulted in the development of  super bugs that are resistant to treatment!   ? ?Home Care: ?Only take medications as instructed by your medical team. ?Do not drink alcohol while taking these medications. ?A steam or ultrasonic humidifier can help congestion.  You can place a towel over your head and breathe in the steam from hot water coming from a faucet. ?Avoid close contacts especially the very young and the elderly. ?Cover your mouth when you cough or sneeze. ?Always remember to wash your hands. ? ?Get Help Right Away If: ?You develop worsening fever or throat pain. ?You develop a severe head ache or visual changes. ?Your symptoms persist after you have completed your treatment plan. ? ?Make sure you ?Understand these instructions. ?Will watch your condition. ?Will get help right away if you are not doing well or get worse. ? ? ?Thank you for choosing an e-visit. ? ?Your e-visit answers were reviewed by a board certified advanced clinical practitioner to complete your personal care plan. Depending upon the condition, your plan could have included both over the counter or prescription medications. ? ?Please review your pharmacy choice. Make sure the pharmacy is open so you can pick up prescription now. If there is a problem, you may contact your provider through [employer] and have the prescription routed to another pharmacy.  Your safety is important to [place]. If you have drug allergies check your prescription carefully.  ? ?For the next 24 hours you can use MyChart to ask questions about today's visit, request a non-urgent call back, or ask for a work or school excuse. ?You  will get an email in the next two days asking about your experience. I hope that your e-visit has been valuable and will speed your recovery.  ? ?I spent approximately 5 minutes reviewing the patient's history, current symptoms and coordinating their plan of care today.   ?

## 2022-03-30 ENCOUNTER — Other Ambulatory Visit: Payer: Self-pay | Admitting: Internal Medicine

## 2022-04-09 ENCOUNTER — Other Ambulatory Visit (INDEPENDENT_AMBULATORY_CARE_PROVIDER_SITE_OTHER): Payer: BC Managed Care – PPO

## 2022-04-09 DIAGNOSIS — E669 Obesity, unspecified: Secondary | ICD-10-CM

## 2022-04-09 DIAGNOSIS — E1169 Type 2 diabetes mellitus with other specified complication: Secondary | ICD-10-CM

## 2022-04-09 LAB — COMPREHENSIVE METABOLIC PANEL
ALT: 23 U/L (ref 0–35)
AST: 30 U/L (ref 0–37)
Albumin: 4.2 g/dL (ref 3.5–5.2)
Alkaline Phosphatase: 48 U/L (ref 39–117)
BUN: 12 mg/dL (ref 6–23)
CO2: 26 mEq/L (ref 19–32)
Calcium: 8.8 mg/dL (ref 8.4–10.5)
Chloride: 103 mEq/L (ref 96–112)
Creatinine, Ser: 0.68 mg/dL (ref 0.40–1.20)
GFR: 99.51 mL/min (ref >60.00)
Glucose, Bld: 101 mg/dL — ABNORMAL HIGH (ref 70–99)
Potassium: 3.4 mEq/L — ABNORMAL LOW (ref 3.5–5.1)
Sodium: 138 mEq/L (ref 135–145)
Total Bilirubin: 0.4 mg/dL (ref 0.2–1.2)
Total Protein: 6.8 g/dL (ref 6.0–8.3)

## 2022-04-09 LAB — HEMOGLOBIN A1C: Hgb A1c MFr Bld: 6.7 % — ABNORMAL HIGH (ref 4.6–6.5)

## 2022-04-24 ENCOUNTER — Encounter: Payer: Self-pay | Admitting: Internal Medicine

## 2022-04-24 ENCOUNTER — Ambulatory Visit (INDEPENDENT_AMBULATORY_CARE_PROVIDER_SITE_OTHER): Payer: BC Managed Care – PPO | Admitting: Internal Medicine

## 2022-04-24 DIAGNOSIS — E1142 Type 2 diabetes mellitus with diabetic polyneuropathy: Secondary | ICD-10-CM

## 2022-04-24 DIAGNOSIS — E1169 Type 2 diabetes mellitus with other specified complication: Secondary | ICD-10-CM

## 2022-04-24 DIAGNOSIS — F4323 Adjustment disorder with mixed anxiety and depressed mood: Secondary | ICD-10-CM

## 2022-04-24 DIAGNOSIS — E119 Type 2 diabetes mellitus without complications: Secondary | ICD-10-CM

## 2022-04-24 DIAGNOSIS — I509 Heart failure, unspecified: Secondary | ICD-10-CM

## 2022-04-24 DIAGNOSIS — E669 Obesity, unspecified: Secondary | ICD-10-CM

## 2022-04-24 DIAGNOSIS — R5383 Other fatigue: Secondary | ICD-10-CM | POA: Diagnosis not present

## 2022-04-24 MED ORDER — SEMAGLUTIDE (2 MG/DOSE) 8 MG/3ML ~~LOC~~ SOPN: 2.0000 mg | PEN_INJECTOR | SUBCUTANEOUS | 3 refills | Status: DC

## 2022-04-24 MED ORDER — CITALOPRAM HYDROBROMIDE 40 MG PO TABS: 40.0000 mg | ORAL_TABLET | Freq: Every day | ORAL | 3 refills | Status: DC

## 2022-04-24 NOTE — Assessment & Plan Note (Signed)
Much better ?Increase Ozempic ?D/c Prandin ?

## 2022-04-24 NOTE — Assessment & Plan Note (Signed)
Cont Glyxambi ?

## 2022-04-24 NOTE — Patient Instructions (Signed)
Blue-Emu cream -- use 2-3 times a day ? ?

## 2022-04-24 NOTE — Assessment & Plan Note (Signed)
Blue-Emu cream -- use 2-3 times a day ? ?

## 2022-04-24 NOTE — Assessment & Plan Note (Signed)
Chronic. 

## 2022-04-24 NOTE — Assessment & Plan Note (Signed)
Grieving ?Xanax prn ? Potential benefits of a long term benzodiazepines  use as well as potential risks  and complications were explained to the patient and were aknowledged. ?

## 2022-04-24 NOTE — Progress Notes (Signed)
? ?Subjective:  ?Patient ID: [name] [name], female    DOB: 1966-06-15  Age: 56 y.o. MRN: [id number] ? ?CC: Follow-up (She would like to discuss her ozempic prescription. ) ? ? ?HPI ?[name] [name] presents for DM, HTN, anxiety ? ?Outpatient Medications Prior to Visit  ?Medication Sig Dispense Refill  ? ALPRAZolam (XANAX) 0.5 MG tablet TAKE 1 TABLET(0.5 MG) BY MOUTH TWICE DAILY 180 tablet 0  ? aspirin 81 MG chewable tablet Chew 1 tablet (81 mg total) by mouth daily. 90 tablet 1  ? atorvastatin (LIPITOR) 10 MG tablet TAKE 1 TABLET BY MOUTH DAILY AT 6PM 90 tablet 3  ? buPROPion (WELLBUTRIN SR) 150 MG 12 hr tablet TATKE 1 TABLET(150 MG TOTAL) BY MOUTH TWICE DAILY 180 tablet 3  ? Cholecalciferol (EQL VITAMIN D3) 1000 units tablet Take 1 tablet (1,000 Units total) by mouth daily. 90 tablet 1  ? clindamycin (CLINDAGEL) 1 % gel Apply topically 2 (two) times daily. Perioral rash 30 g 2  ? gabapentin (NEURONTIN) 100 MG capsule Take 1 capsule (100 mg total) by mouth 3 (three) times daily. 90 capsule 3  ? GLYXAMBI 25-5 MG TABS Take 1 tablet by mouth daily. Take 1 tablet by mouth daily. 90 tablet 3  ? metFORMIN (GLUCOPHAGE) 500 MG tablet Take 2 tablets (1,000 mg total) by mouth 2 (two) times daily with a meal. Overdue for follow-up must see MD for future refills 360 tablet 0  ? methocarbamol (ROBAXIN) 500 MG tablet methocarbamol 500 mg tablet    ? nystatin-triamcinolone (MYCOLOG II) cream nystatin-triamcinolone 100,000 unit/g-0.1 % topical cream ? APPLY TO THE AFFECTED AREA(S) BY TOPICAL ROUTE 2 TIMES PER DAY IN THE MORNING AND EVENING    ? ondansetron (ZOFRAN ODT) 4 MG disintegrating tablet Take 1 tablet (4 mg total) by mouth every 8 (eight) hours as needed for nausea or vomiting. 20 tablet 0  ? pantoprazole (PROTONIX) 40 MG tablet Take 1 tablet (40 mg total) by mouth 2 (two) times daily. 180 tablet 3  ? repaglinide (PRANDIN) 2 MG tablet TAKE 1 TABLET BY MOUTH THREE TIMES DAILY BEFORE MEALS 270 tablet 3  ?  amoxicillin-clavulanate (AUGMENTIN) 875-125 MG tablet Take 1 tablet by mouth 2 (two) times daily. 14 tablet 0  ? citalopram (CELEXA) 40 MG tablet Take 1 tablet (40 mg total) by mouth daily. 90 tablet 3  ? fenofibrate (TRICOR) 145 MG tablet TAKE 1 TABLET(145 MG) BY MOUTH DAILY 90 tablet 3  ? Semaglutide, 1 MG/DOSE, 4 MG/3ML SOPN Inject 1 mg as directed once a week. 3 mL 3  ? ?No facility-administered medications prior to visit.  ? ? ?ROS: ?Review of Systems  ?Constitutional:  Negative for activity change, appetite change, chills, fatigue and unexpected weight change.  ?HENT:  Negative for congestion, mouth sores and sinus pressure.   ?Eyes:  Negative for visual disturbance.  ?Respiratory:  Negative for cough and chest tightness.   ?Gastrointestinal:  Negative for abdominal pain and nausea.  ?Genitourinary:  Negative for difficulty urinating, frequency and vaginal pain.  ?Musculoskeletal:  Negative for back pain and gait problem.  ?Skin:  Negative for pallor and rash.  ?Neurological:  Negative for dizziness, tremors, weakness, numbness and headaches.  ?Psychiatric/Behavioral:  Negative for confusion and sleep disturbance.   ? ?Objective:  ?BP 126/80   Pulse 96   Temp 98.3 ?F (36.8 ?C) (Oral)   Ht '5\' 8"'$  (1.727 m)   Wt 191 lb (86.6 kg)   SpO2 93%   BMI 29.04 kg/m?  ? ?  BP Readings from Last 3 Encounters:  ?04/24/22 126/80  ?01/20/22 (!) 154/62  ?10/23/21 (!) 168/70  ? ? ?Wt Readings from Last 3 Encounters:  ?04/24/22 191 lb (86.6 kg)  ?01/20/22 196 lb 3.2 oz (89 kg)  ?10/23/21 200 lb 6.4 oz (90.9 kg)  ? ? ?Physical Exam ?Constitutional:   ?   General: She is not in acute distress. ?   Appearance: She is well-developed. She is obese.  ?HENT:  ?   Head: Normocephalic.  ?   Right Ear: External ear normal.  ?   Left Ear: External ear normal.  ?   Nose: Nose normal.  ?Eyes:  ?   General:     ?   Right eye: No discharge.     ?   Left eye: No discharge.  ?   Conjunctiva/sclera: Conjunctivae normal.  ?   Pupils: Pupils are  equal, round, and reactive to light.  ?Neck:  ?   Thyroid: No thyromegaly.  ?   Vascular: No JVD.  ?   Trachea: No tracheal deviation.  ?Cardiovascular:  ?   Rate and Rhythm: Normal rate and regular rhythm.  ?   Heart sounds: Normal heart sounds.  ?Pulmonary:  ?   Effort: No respiratory distress.  ?   Breath sounds: No stridor. No wheezing.  ?Abdominal:  ?   General: Bowel sounds are normal. There is no distension.  ?   Palpations: Abdomen is soft. There is no mass.  ?   Tenderness: There is no abdominal tenderness. There is no guarding or rebound.  ?Musculoskeletal:     ?   General: No tenderness.  ?   Cervical back: Normal range of motion and neck supple. No rigidity.  ?Lymphadenopathy:  ?   Cervical: No cervical adenopathy.  ?Skin: ?   Findings: No erythema or rash.  ?Neurological:  ?   Cranial Nerves: No cranial nerve deficit.  ?   Motor: No abnormal muscle tone.  ?   Coordination: Coordination normal.  ?   Deep Tendon Reflexes: Reflexes normal.  ?Psychiatric:     ?   Behavior: Behavior normal.     ?   Thought Content: Thought content normal.     ?   Judgment: Judgment normal.  ? ? ?Lab Results  ?Component Value Date  ? WBC 7.6 01/24/2021  ? HGB 13.5 01/24/2021  ? HCT 41.8 01/24/2021  ? PLT 190 01/24/2021  ? GLUCOSE 101 (H) 04/09/2022  ? CHOL 156 08/11/2019  ? TRIG (H) 08/11/2019  ?  554.0 Triglyceride is over 400; calculations on Lipids are invalid.  ? HDL 28.10 (L) 08/11/2019  ? LDLDIRECT 69.0 08/11/2019  ? [place] 97 10/23/2017  ? ALT 23 04/09/2022  ? AST 30 04/09/2022  ? NA 138 04/09/2022  ? K 3.4 (L) 04/09/2022  ? CL 103 04/09/2022  ? CREATININE 0.68 04/09/2022  ? BUN 12 04/09/2022  ? CO2 26 04/09/2022  ? TSH 3.10 07/31/2020  ? HGBA1C 6.7 (H) 04/09/2022  ? MICROALBUR 2.9 (H) 07/31/2020  ? ? ?No results found. ? ?Assessment & Plan:  ? ?Problem List Items Addressed This Visit   ? ? Diabetes mellitus type 2 in obese [facility])  ?  Much better ?Increase Ozempic ?D/c Prandin ? ?  ?  ? Relevant Medications  ?  Semaglutide, 2 MG/DOSE, 8 MG/3ML SOPN  ? Adjustment disorder with mixed anxiety and depressed mood  ?  Grieving ?Xanax prn ? Potential benefits of a long term benzodiazepines  use  as well as potential risks  and complications were explained to the patient and were aknowledged. ?  ?  ? Congestive heart failure ([place])  ?  Cont Glyxambi ?  ?  ? Fatigue  ?  Chronic ? ?  ?  ? Relevant Medications  ? citalopram (CELEXA) 40 MG tablet  ? Other Relevant Orders  ? Hemoglobin A1c  ? Comprehensive metabolic panel  ? Diabetic neuropathy ([place])  ?  Blue-Emu cream --use 2-3 times a day ?  ?  ? Relevant Medications  ? Semaglutide, 2 MG/DOSE, 8 MG/3ML SOPN  ? citalopram (CELEXA) 40 MG tablet  ? Other Relevant Orders  ? Hemoglobin A1c  ? Comprehensive metabolic panel  ?  ? ? ?Meds ordered this encounter  ?Medications  ? Semaglutide, 2 MG/DOSE, 8 MG/3ML SOPN  ?  Sig: Inject 2 mg as directed once a week.  ?  Dispense:  3 mL  ?  Refill:  3  ? citalopram (CELEXA) 40 MG tablet  ?  Sig: Take 1 tablet (40 mg total) by mouth at bedtime.  ?  Dispense:  90 tablet  ?  Refill:  3  ?  ? ? ?Follow-up: Return in about 3 months (around 07/25/2022) for a follow-up visit. ? ?[name], MD ?

## 2022-07-28 ENCOUNTER — Ambulatory Visit: Payer: BC Managed Care – PPO | Admitting: Internal Medicine

## 2022-08-06 ENCOUNTER — Ambulatory Visit: Payer: BC Managed Care – PPO | Admitting: Internal Medicine

## 2022-08-14 ENCOUNTER — Ambulatory Visit (INDEPENDENT_AMBULATORY_CARE_PROVIDER_SITE_OTHER): Payer: BC Managed Care – PPO | Admitting: Internal Medicine

## 2022-08-14 ENCOUNTER — Encounter: Payer: Self-pay | Admitting: Internal Medicine

## 2022-08-14 VITALS — BP 138/78 | HR 88 | Temp 98.5°F | Ht 68.0 in | Wt 193.6 lb

## 2022-08-14 DIAGNOSIS — D509 Iron deficiency anemia, unspecified: Secondary | ICD-10-CM | POA: Diagnosis not present

## 2022-08-14 DIAGNOSIS — E1169 Type 2 diabetes mellitus with other specified complication: Secondary | ICD-10-CM | POA: Diagnosis not present

## 2022-08-14 DIAGNOSIS — R5383 Other fatigue: Secondary | ICD-10-CM

## 2022-08-14 DIAGNOSIS — F419 Anxiety disorder, unspecified: Secondary | ICD-10-CM

## 2022-08-14 DIAGNOSIS — F4323 Adjustment disorder with mixed anxiety and depressed mood: Secondary | ICD-10-CM

## 2022-08-14 DIAGNOSIS — E669 Obesity, unspecified: Secondary | ICD-10-CM

## 2022-08-14 MED ORDER — SEMAGLUTIDE (2 MG/DOSE) 8 MG/3ML ~~LOC~~ SOPN: 2.0000 mg | PEN_INJECTOR | SUBCUTANEOUS | 3 refills | Status: DC

## 2022-08-14 NOTE — Progress Notes (Signed)
Subjective:  Patient ID: [name] [name], female    DOB: 1966-08-15  Age: 56 y.o. MRN: [id number]  CC: Follow-up (3 month f/u)   HPI [name] [name] presents for DM, HTN Pt was out of Rx x 6 weeks  Outpatient Medications Prior to Visit  Medication Sig Dispense Refill   ALPRAZolam (XANAX) 0.5 MG tablet TAKE 1 TABLET(0.5 MG) BY MOUTH TWICE DAILY 180 tablet 0   aspirin 81 MG chewable tablet Chew 1 tablet (81 mg total) by mouth daily. 90 tablet 1   atorvastatin (LIPITOR) 10 MG tablet TAKE 1 TABLET BY MOUTH DAILY AT 6PM 90 tablet 3   buPROPion (WELLBUTRIN SR) 150 MG 12 hr tablet TATKE 1 TABLET(150 MG TOTAL) BY MOUTH TWICE DAILY 180 tablet 3   Cholecalciferol (EQL VITAMIN D3) 1000 units tablet Take 1 tablet (1,000 Units total) by mouth daily. 90 tablet 1   citalopram (CELEXA) 40 MG tablet Take 1 tablet (40 mg total) by mouth at bedtime. 90 tablet 3   gabapentin (NEURONTIN) 100 MG capsule Take 1 capsule (100 mg total) by mouth 3 (three) times daily. 90 capsule 3   GLYXAMBI 25-5 MG TABS Take 1 tablet by mouth daily. Take 1 tablet by mouth daily. 90 tablet 3   metFORMIN (GLUCOPHAGE) 500 MG tablet Take 2 tablets (1,000 mg total) by mouth 2 (two) times daily with a meal. Overdue for follow-up must see MD for future refills 360 tablet 0   ondansetron (ZOFRAN ODT) 4 MG disintegrating tablet Take 1 tablet (4 mg total) by mouth every 8 (eight) hours as needed for nausea or vomiting. 20 tablet 0   pantoprazole (PROTONIX) 40 MG tablet Take 1 tablet (40 mg total) by mouth 2 (two) times daily. 180 tablet 3   repaglinide (PRANDIN) 2 MG tablet TAKE 1 TABLET BY MOUTH THREE TIMES DAILY BEFORE MEALS 270 tablet 3   Semaglutide, 2 MG/DOSE, 8 MG/3ML SOPN Inject 2 mg as directed once a week. 3 mL 3   clindamycin (CLINDAGEL) 1 % gel Apply topically 2 (two) times daily. Perioral rash (Patient not taking: Reported on 08/14/2022) 30 g 2   methocarbamol (ROBAXIN) 500 MG tablet methocarbamol 500 mg tablet (Patient not  taking: Reported on 08/14/2022)     nystatin-triamcinolone (MYCOLOG II) cream nystatin-triamcinolone 100,000 unit/g-0.1 % topical cream  APPLY TO THE AFFECTED AREA(S) BY TOPICAL ROUTE 2 TIMES PER DAY IN THE MORNING AND EVENING (Patient not taking: Reported on 08/14/2022)     No facility-administered medications prior to visit.    ROS: Review of Systems  Constitutional:  Positive for fatigue. Negative for activity change, appetite change, chills and unexpected weight change.  HENT:  Negative for congestion, mouth sores and sinus pressure.   Eyes:  Negative for visual disturbance.  Respiratory:  Negative for cough and chest tightness.   Gastrointestinal:  Negative for abdominal pain and nausea.  Genitourinary:  Negative for difficulty urinating, frequency and vaginal pain.  Musculoskeletal:  Negative for back pain and gait problem.  Skin:  Negative for pallor and rash.  Neurological:  Negative for dizziness, tremors, weakness, numbness and headaches.  Psychiatric/Behavioral:  Negative for confusion and sleep disturbance.     Objective:  BP 138/78 (BP Location: Left Arm)   Pulse 88   Temp 98.5 F (36.9 C) (Oral)   Ht '5\' 8"'$  (1.727 m)   Wt 193 lb 9.6 oz (87.8 kg)   SpO2 97%   BMI 29.44 kg/m   BP Readings from Last 3 Encounters:  08/14/22 138/78  04/24/22 126/80  01/20/22 (!) 154/62    Wt Readings from Last 3 Encounters:  08/14/22 193 lb 9.6 oz (87.8 kg)  04/24/22 191 lb (86.6 kg)  01/20/22 196 lb 3.2 oz (89 kg)    Physical Exam Constitutional:      General: She is not in acute distress.    Appearance: She is well-developed. She is obese.  HENT:     Head: Normocephalic.     Right Ear: External ear normal.     Left Ear: External ear normal.     Nose: Nose normal.  Eyes:     General:        Right eye: No discharge.        Left eye: No discharge.     Conjunctiva/sclera: Conjunctivae normal.     Pupils: Pupils are equal, round, and reactive to light.  Neck:      Thyroid: No thyromegaly.     Vascular: No JVD.     Trachea: No tracheal deviation.  Cardiovascular:     Rate and Rhythm: Normal rate and regular rhythm.     Heart sounds: Normal heart sounds.  Pulmonary:     Effort: No respiratory distress.     Breath sounds: No stridor. No wheezing.  Abdominal:     General: Bowel sounds are normal. There is no distension.     Palpations: Abdomen is soft. There is no mass.     Tenderness: There is no abdominal tenderness. There is no guarding or rebound.  Musculoskeletal:        General: No tenderness.     Cervical back: Normal range of motion and neck supple. No rigidity.  Lymphadenopathy:     Cervical: No cervical adenopathy.  Skin:    Findings: No erythema or rash.  Neurological:     Cranial Nerves: No cranial nerve deficit.     Motor: No abnormal muscle tone.     Coordination: Coordination normal.     Deep Tendon Reflexes: Reflexes normal.  Psychiatric:        Behavior: Behavior normal.        Thought Content: Thought content normal.        Judgment: Judgment normal.     Lab Results  Component Value Date   WBC 6.3 08/15/2022   HGB 12.6 08/15/2022   HCT 38.5 08/15/2022   PLT 187.0 08/15/2022   GLUCOSE 117 (H) 08/15/2022   CHOL 156 08/11/2019   TRIG (H) 08/11/2019    554.0 Triglyceride is over 400; calculations on Lipids are invalid.   HDL 28.10 (L) 08/11/2019   LDLDIRECT 69.0 08/11/2019   LDLCALC 97 10/23/2017   ALT 25 08/15/2022   AST 25 08/15/2022   NA 139 08/15/2022   K 4.1 08/15/2022   CL 102 08/15/2022   CREATININE 0.63 08/15/2022   BUN 8 08/15/2022   CO2 25 08/15/2022   TSH 2.63 08/15/2022   HGBA1C 7.0 (H) 08/15/2022   MICROALBUR 2.9 (H) 07/31/2020    No results found.  Assessment & Plan:   Problem List Items Addressed This Visit     Adjustment disorder with mixed anxiety and depressed mood    Cont on Xanax prn  Potential benefits of a long term benzodiazepines  use as well as potential risks  and  complications were explained to the patient and were aknowledged.       Relevant Orders   CBC with Differential/Platelet (Completed)   Iron, TIBC and Ferritin Panel (Completed)   Comprehensive  metabolic panel (Completed)   Hemoglobin A1c (Completed)   TSH (Completed)   Anemia    If anemic, consider Accrufer Check CBC, iron test      Relevant Orders   CBC with Differential/Platelet (Completed)   Iron, TIBC and Ferritin Panel (Completed)   Anxiety disorder    Continue with citalopram and Wellbutrin Continue with alprazolam as needed  Potential benefits of a long term benzodiazepines  use as well as potential risks  and complications were explained to the patient and were aknowledged.      Diabetes mellitus type 2 in obese [facility]) - Primary    The patient was not able to get her Ozempic filled for 6 weeks due to shortage.  Will restart.  Labs reviewed      Relevant Medications   Semaglutide, 2 MG/DOSE, 8 MG/3ML SOPN   Other Relevant Orders   Hemoglobin A1c (Completed)   Fatigue    Chronic sx's      Relevant Orders   CBC with Differential/Platelet (Completed)   Iron, TIBC and Ferritin Panel (Completed)   Comprehensive metabolic panel (Completed)   Hemoglobin A1c (Completed)   TSH (Completed)      Meds ordered this encounter  Medications   Semaglutide, 2 MG/DOSE, 8 MG/3ML SOPN    Sig: Inject 2 mg as directed once a week.    Dispense:  12 mL    Refill:  3      Follow-up: Return in about 3 months (around 11/14/2022) for a follow-up visit.  [name], MD

## 2022-08-14 NOTE — Assessment & Plan Note (Signed)
Chronic sx's 

## 2022-08-14 NOTE — Assessment & Plan Note (Signed)
Cont on Xanax prn °Potential benefits of a long term benzodiazepines  use as well as potential risks  and complications were explained to the patient and were aknowledged. °

## 2022-08-15 ENCOUNTER — Other Ambulatory Visit (INDEPENDENT_AMBULATORY_CARE_PROVIDER_SITE_OTHER): Payer: BC Managed Care – PPO

## 2022-08-15 DIAGNOSIS — D509 Iron deficiency anemia, unspecified: Secondary | ICD-10-CM

## 2022-08-15 DIAGNOSIS — E1169 Type 2 diabetes mellitus with other specified complication: Secondary | ICD-10-CM | POA: Diagnosis not present

## 2022-08-15 DIAGNOSIS — F4323 Adjustment disorder with mixed anxiety and depressed mood: Secondary | ICD-10-CM

## 2022-08-15 DIAGNOSIS — R5383 Other fatigue: Secondary | ICD-10-CM | POA: Diagnosis not present

## 2022-08-15 DIAGNOSIS — E669 Obesity, unspecified: Secondary | ICD-10-CM

## 2022-08-15 LAB — TSH: TSH: 2.63 u[IU]/mL (ref 0.35–5.50)

## 2022-08-15 LAB — COMPREHENSIVE METABOLIC PANEL
ALT: 25 U/L (ref 0–35)
AST: 25 U/L (ref 0–37)
Albumin: 4.2 g/dL (ref 3.5–5.2)
Alkaline Phosphatase: 84 U/L (ref 39–117)
BUN: 8 mg/dL (ref 6–23)
CO2: 25 mEq/L (ref 19–32)
Calcium: 9.1 mg/dL (ref 8.4–10.5)
Chloride: 102 mEq/L (ref 96–112)
Creatinine, Ser: 0.63 mg/dL (ref 0.40–1.20)
GFR: 101.11 mL/min (ref >60.00)
Glucose, Bld: 117 mg/dL — ABNORMAL HIGH (ref 70–99)
Potassium: 4.1 mEq/L (ref 3.5–5.1)
Sodium: 139 mEq/L (ref 135–145)
Total Bilirubin: 0.7 mg/dL (ref 0.2–1.2)
Total Protein: 7 g/dL (ref 6.0–8.3)

## 2022-08-15 LAB — CBC WITH DIFFERENTIAL/PLATELET
Basophils Absolute: 0.1 10*3/uL (ref 0.0–0.1)
Basophils Relative: 0.8 % (ref 0.0–3.0)
Eosinophils Absolute: 0.2 10*3/uL (ref 0.0–0.7)
Eosinophils Relative: 2.8 % (ref 0.0–5.0)
HCT: 38.5 % (ref 36.0–46.0)
Hemoglobin: 12.6 g/dL (ref 12.0–15.0)
Lymphocytes Relative: 23.8 % (ref 12.0–46.0)
Lymphs Abs: 1.5 10*3/uL (ref 0.7–4.0)
MCHC: 32.7 g/dL (ref 30.0–36.0)
MCV: 80.8 fl (ref 78.0–100.0)
Monocytes Absolute: 0.3 10*3/uL (ref 0.1–1.0)
Monocytes Relative: 4.7 % (ref 3.0–12.0)
Neutro Abs: 4.3 10*3/uL (ref 1.4–7.7)
Neutrophils Relative %: 67.9 % (ref 43.0–77.0)
Platelets: 187 10*3/uL (ref 150.0–400.0)
RBC: 4.77 Mil/uL (ref 3.87–5.11)
RDW: 14.9 % (ref 11.5–15.5)
WBC: 6.3 10*3/uL (ref 4.0–10.5)

## 2022-08-15 LAB — HEMOGLOBIN A1C: Hgb A1c MFr Bld: 7 % — ABNORMAL HIGH (ref 4.6–6.5)

## 2022-08-16 LAB — IRON,TIBC AND FERRITIN PANEL
%SAT: 12 % (calc) — ABNORMAL LOW (ref 16–45)
Ferritin: 8 ng/mL — ABNORMAL LOW (ref 16–232)
Iron: 45 ug/dL (ref 45–160)
TIBC: 362 mcg/dL (calc) (ref 250–450)

## 2022-08-17 NOTE — Assessment & Plan Note (Signed)
The patient was not able to get her Ozempic filled for 6 weeks due to shortage.  Will restart.  Labs reviewed

## 2022-08-17 NOTE — Assessment & Plan Note (Signed)
Continue with citalopram and Wellbutrin Continue with alprazolam as needed  Potential benefits of a long term benzodiazepines  use as well as potential risks  and complications were explained to the patient and were aknowledged.

## 2022-08-17 NOTE — Assessment & Plan Note (Signed)
If anemic, consider Accrufer Check CBC, iron test

## 2022-08-27 ENCOUNTER — Encounter: Payer: Self-pay | Admitting: Oncology

## 2022-08-27 ENCOUNTER — Other Ambulatory Visit: Payer: Self-pay | Admitting: Family Medicine

## 2022-08-27 ENCOUNTER — Ambulatory Visit
Admission: RE | Admit: 2022-08-27 | Discharge: 2022-08-27 | Disposition: A | Discharge status: Home / Self Care | Payer: Worker's Compensation | Source: Ambulatory Visit | Attending: Family Medicine | Admitting: Family Medicine

## 2022-08-27 DIAGNOSIS — M79641 Pain in right hand: Secondary | ICD-10-CM

## 2022-09-03 ENCOUNTER — Other Ambulatory Visit: Payer: Self-pay | Admitting: Internal Medicine

## 2022-09-03 DIAGNOSIS — E1142 Type 2 diabetes mellitus with diabetic polyneuropathy: Secondary | ICD-10-CM

## 2022-09-03 DIAGNOSIS — R5383 Other fatigue: Secondary | ICD-10-CM

## 2022-09-08 ENCOUNTER — Encounter: Payer: Self-pay | Admitting: Internal Medicine

## 2022-09-08 NOTE — Telephone Encounter (Signed)
Medication is not on med list pls advise.Marland KitchenJohny [name]

## 2022-09-19 ENCOUNTER — Other Ambulatory Visit: Payer: Self-pay | Admitting: Internal Medicine

## 2022-09-19 DIAGNOSIS — D508 Other iron deficiency anemias: Secondary | ICD-10-CM

## 2022-09-24 ENCOUNTER — Other Ambulatory Visit: Payer: Self-pay | Admitting: *Deleted

## 2022-09-24 ENCOUNTER — Other Ambulatory Visit: Payer: Self-pay | Admitting: Internal Medicine

## 2022-09-24 DIAGNOSIS — D508 Other iron deficiency anemias: Secondary | ICD-10-CM

## 2022-09-24 MED ORDER — BUPROPION HCL ER (SR) 150 MG PO TB12: ORAL_TABLET | ORAL | 3 refills | Status: DC

## 2022-09-24 MED ORDER — ACCRUFER 30 MG PO CAPS: ORAL_CAPSULE | ORAL | 5 refills | Status: DC

## 2022-09-24 MED ORDER — FENOFIBRATE 145 MG PO TABS: 145.0000 mg | ORAL_TABLET | Freq: Every day | ORAL | 3 refills | Status: DC

## 2022-09-25 ENCOUNTER — Telehealth: Payer: Self-pay | Admitting: *Deleted

## 2022-09-25 NOTE — Telephone Encounter (Signed)
Pt need PA on her Accrufer 30 mg submitted w/ (Key: [id number]) PA sent to insurance plan.Marland KitchenJohny Chess

## 2022-09-25 NOTE — Telephone Encounter (Signed)
Rec'd fax from [employer]-my-meds stating they do not do PA for [facility]. Called [employer] gave response was given new Key# [id number].[name] Completed waiting on determination.Marland KitchenChryl Heck

## 2022-10-09 NOTE — Telephone Encounter (Signed)
Called [employer] inform about PA that was sent almost 2 weeks ago. He states med was shipped out and delivered.Marland KitchenJohny [name]

## 2022-10-14 ENCOUNTER — Encounter: Payer: Self-pay | Admitting: Internal Medicine

## 2022-11-12 ENCOUNTER — Ambulatory Visit: Payer: BC Managed Care – PPO | Admitting: Internal Medicine

## 2022-11-19 ENCOUNTER — Telehealth: Payer: BC Managed Care – PPO | Admitting: Physician Assistant

## 2022-11-19 DIAGNOSIS — R042 Hemoptysis: Secondary | ICD-10-CM

## 2022-11-19 NOTE — Progress Notes (Signed)
Because of difficulty actually swallowing along with ongoing phlegm and not producing blood, I feel your condition warrants further evaluation and I recommend that you be seen in a face to face visit.   NOTE: There will be NO CHARGE for this eVisit   If you are having a true medical emergency please call 911.      For an urgent face to face visit, [facility] has seven urgent care centers for your convenience:     [place] [facility] [place] [facility] [place] Get Driving Directions [phone] [address] [place], [place] [address]    [place] [facility]) Get Driving Directions [phone] [place], [place] [address]  [place] [facility] [place] ([place]) Get Driving Directions [phone] [address] [place],  [place]  [address]  [place] [facility] - at [address] Get Driving Directions  [phone] [address] [place],  [place] [address]   [place] [facility] Get Driving Directions [phone] [address] [place], [place], [place] [address]   [place] [facility] Get Driving Directions  [phone] [address] [place], [place] [address]    [facility]gent [facility] at [place] Get Driving Directions [phone] [address] [place],  [address]  Your MyChart E-visit questionnaire answers were reviewed by a board certified advanced clinical practitioner to complete your personal care plan based on your specific symptoms.  Thank you for using e-Visits.

## 2022-12-05 ENCOUNTER — Other Ambulatory Visit: Payer: Self-pay | Admitting: Internal Medicine

## 2022-12-24 ENCOUNTER — Encounter: Payer: Self-pay | Admitting: Internal Medicine

## 2023-01-03 ENCOUNTER — Emergency Department (HOSPITAL_COMMUNITY): Payer: BC Managed Care – PPO

## 2023-01-03 ENCOUNTER — Ambulatory Visit (HOSPITAL_COMMUNITY): Admission: RE | Admit: 2023-01-03 | Payer: BC Managed Care – PPO | Source: Ambulatory Visit

## 2023-01-03 ENCOUNTER — Other Ambulatory Visit: Payer: Self-pay

## 2023-01-03 ENCOUNTER — Emergency Department (HOSPITAL_COMMUNITY)
Admission: EM | Admit: 2023-01-03 | Discharge: 2023-01-03 | Discharge status: Against Medical Advice | Payer: BC Managed Care – PPO | Attending: Emergency Medicine | Admitting: Emergency Medicine

## 2023-01-03 DIAGNOSIS — R42 Dizziness and giddiness: Secondary | ICD-10-CM | POA: Insufficient documentation

## 2023-01-03 DIAGNOSIS — Z7982 Long term (current) use of aspirin: Secondary | ICD-10-CM | POA: Diagnosis not present

## 2023-01-03 DIAGNOSIS — Z7984 Long term (current) use of oral hypoglycemic drugs: Secondary | ICD-10-CM | POA: Diagnosis not present

## 2023-01-03 DIAGNOSIS — E119 Type 2 diabetes mellitus without complications: Secondary | ICD-10-CM | POA: Insufficient documentation

## 2023-01-03 DIAGNOSIS — I509 Heart failure, unspecified: Secondary | ICD-10-CM | POA: Diagnosis not present

## 2023-01-03 DIAGNOSIS — I493 Ventricular premature depolarization: Secondary | ICD-10-CM | POA: Insufficient documentation

## 2023-01-03 DIAGNOSIS — Z5329 Procedure and treatment not carried out because of patient's decision for other reasons: Secondary | ICD-10-CM | POA: Diagnosis not present

## 2023-01-03 DIAGNOSIS — R7989 Other specified abnormal findings of blood chemistry: Secondary | ICD-10-CM | POA: Insufficient documentation

## 2023-01-03 DIAGNOSIS — R002 Palpitations: Secondary | ICD-10-CM | POA: Diagnosis present

## 2023-01-03 LAB — CBC WITH DIFFERENTIAL/PLATELET
Abs Immature Granulocytes: 0.05 10*3/uL (ref 0.00–0.07)
Basophils Absolute: 0.1 10*3/uL (ref 0.0–0.1)
Basophils Relative: 1 %
Eosinophils Absolute: 0.2 10*3/uL (ref 0.0–0.5)
Eosinophils Relative: 2 %
HCT: 41.9 % (ref 36.0–46.0)
Hemoglobin: 13 g/dL (ref 12.0–15.0)
Immature Granulocytes: 1 %
Lymphocytes Relative: 31 %
Lymphs Abs: 3.5 10*3/uL (ref 0.7–4.0)
MCH: 25.8 pg — ABNORMAL LOW (ref 26.0–34.0)
MCHC: 31 g/dL (ref 30.0–36.0)
MCV: 83.1 fL (ref 80.0–100.0)
Monocytes Absolute: 0.5 10*3/uL (ref 0.1–1.0)
Monocytes Relative: 5 %
Neutro Abs: 6.7 10*3/uL (ref 1.7–7.7)
Neutrophils Relative %: 60 %
Platelets: 229 10*3/uL (ref 150–400)
RBC: 5.04 MIL/uL (ref 3.87–5.11)
RDW: 15.8 % — ABNORMAL HIGH (ref 11.5–15.5)
WBC: 11.1 10*3/uL — ABNORMAL HIGH (ref 4.0–10.5)
nRBC: 0 % (ref 0.0–0.2)

## 2023-01-03 LAB — COMPREHENSIVE METABOLIC PANEL
ALT: 23 U/L (ref 0–44)
AST: 24 U/L (ref 15–41)
Albumin: 3.9 g/dL (ref 3.5–5.0)
Alkaline Phosphatase: 74 U/L (ref 38–126)
Anion gap: 11 (ref 5–15)
BUN: 11 mg/dL (ref 6–20)
CO2: 21 mmol/L — ABNORMAL LOW (ref 22–32)
Calcium: 9.4 mg/dL (ref 8.9–10.3)
Chloride: 105 mmol/L (ref 98–111)
Creatinine, Ser: 0.6 mg/dL (ref 0.44–1.00)
GFR, Estimated: >60 mL/min (ref >60)
Glucose, Bld: 117 mg/dL — ABNORMAL HIGH (ref 70–99)
Potassium: 4.1 mmol/L (ref 3.5–5.1)
Sodium: 137 mmol/L (ref 135–145)
Total Bilirubin: 0.7 mg/dL (ref 0.3–1.2)
Total Protein: 7 g/dL (ref 6.5–8.1)

## 2023-01-03 LAB — D-DIMER, QUANTITATIVE: D-Dimer, Quant: 1.83 ug/mL-FEU — ABNORMAL HIGH (ref 0.00–0.50)

## 2023-01-03 LAB — MAGNESIUM: Magnesium: 2 mg/dL (ref 1.7–2.4)

## 2023-01-03 LAB — TROPONIN I (HIGH SENSITIVITY)
Troponin I (High Sensitivity): 5 ng/L (ref <18)
Troponin I (High Sensitivity): 6 ng/L (ref <18)

## 2023-01-03 MED ORDER — ACETAMINOPHEN 500 MG PO TABS
1000.0000 mg | ORAL_TABLET | Freq: Once | ORAL | Status: AC
  Administered 2023-01-03: 1000 mg via ORAL
  Filled 2023-01-03: qty 2

## 2023-01-03 MED ORDER — METOCLOPRAMIDE HCL 5 MG/ML IJ SOLN
10.0000 mg | Freq: Once | INTRAMUSCULAR | Status: AC
  Administered 2023-01-03: 10 mg via INTRAVENOUS
  Filled 2023-01-03: qty 2

## 2023-01-03 MED ORDER — LACTATED RINGERS IV BOLUS
1000.0000 mL | Freq: Once | INTRAVENOUS | Status: AC
  Administered 2023-01-03: 1000 mL via INTRAVENOUS

## 2023-01-03 MED ORDER — KETOROLAC TROMETHAMINE 15 MG/ML IJ SOLN
15.0000 mg | Freq: Once | INTRAMUSCULAR | Status: AC
  Administered 2023-01-03: 15 mg via INTRAVENOUS
  Filled 2023-01-03: qty 1

## 2023-01-03 NOTE — ED Notes (Addendum)
Pt stated she wants to go home she's been here for 5 hrs and needed to go to the bathroom and no one ever came stated she will leave AMA if she has to, sent a message to the Dr and included the nurse

## 2023-01-03 NOTE — Discharge Instructions (Signed)
You were seen in the emergency department for your heart palpitations, dizziness and shortness of breath.  Your workup showed that you had extra beats on your EKG called PVCs which can cause the palpitations and may be related to your caffeine use.  You had no signs of heart attack.  You did have a positive D-dimer level concerning that you might have a blood clot in your lungs and you are recommended to have a CAT scan to further evaluate for blood clots, however you chose to leave [place].  Blood clots can lead to worsening shortness of breath, heart dysfunction and death.  You should have close follow-up with your primary doctor to have your symptoms rechecked and you can return to the emergency department at any time should you want completion of your workup or if you have any new or worsening symptoms.

## 2023-01-03 NOTE — ED Notes (Signed)
Pt stated she needed air and was going to wait for her discharge papers in the lobby, I let the dr know, said I can give them to the pt once printed, Went to the lobby twice to provide paperwork pt left without paperwork advised the MD and RN

## 2023-01-03 NOTE — ED Triage Notes (Addendum)
Pt reports palpitations and nausea that started this morning, dizziness and headache x 3 days.

## 2023-01-03 NOTE — ED Provider Triage Note (Cosign Needed)
Emergency Medicine Provider Triage Evaluation Note  [name] [name] , a 57 y.o. female  was evaluated in triage.  Pt complains of headache, lightheadedness for the past 3 days.  Palpitations and nausea started today.  She states she was trying to wait until Tuesday for her PCP appointment however just could not given her symptoms worsened today.  States she at times feels palpitations like her "heart will beat out of her chest", other times has chest pressure.  Hypertensive in triage.  Denies known history of hypertension.  Does not take any antihypertensive.  Review of Systems  Positive: As above Negative: As above  Physical Exam  BP (!) 205/104 (BP Location: Right Arm)   Pulse (!) 114   Temp 98.3 F (36.8 C) (Oral)   Resp 14   Ht '5\' 8"'$  (1.727 m)   Wt 81.6 kg   SpO2 100%   BMI 27.37 kg/m  Gen:   Awake, no distress   Resp:  Normal effort  MSK:   Moves extremities without difficulty  Other:    Medical Decision Making  Medically screening exam initiated at 5:43 PM.  Appropriate orders placed.  BERNIS STECHER was informed that the remainder of the evaluation will be completed by another provider, this initial triage assessment does not replace that evaluation, and the importance of remaining in the ED until their evaluation is complete.  Sinus rhythm with frequent PVCs noted on telemetry   Evlyn Courier, [place] 01/03/23 1745

## 2023-01-03 NOTE — ED Provider Notes (Signed)
[facility] DEPT Provider Note   CSN: [id number] Arrival date & time: 01/03/23  1652     History  Chief Complaint  Patient presents with   Palpitations    [name] [name] is a 57 y.o. female.  Patient is a 57 year old female with a past medical history of diabetes, CHF, and anxiety presenting to the emergency department with heart palpitations and dizziness.  States that she has been having headaches for the last 3 days with associated photophobia and mild nausea but denies any vomiting.  She states that she does get headaches but they typically do not last this long.  She states that today she began to feel lightheaded and dizzy and had associated shortness of breath.  She denied any chest pain.  She denies any lower extremity swelling, fevers or cough.  Dates that she felt like her heart was racing.  She denies any recent hospitalizations or surgeries, recent long travels in the car or plane, blood thinner use, hormone use or cancer history.  The history is provided by the patient.  Palpitations      Home Medications Prior to Admission medications   Medication Sig Start Date End Date Taking? Authorizing Provider  ALPRAZolam (XANAX) 0.5 MG tablet TAKE 1 TABLET(0.5 MG) BY MOUTH TWICE DAILY 01/20/22   [name], [name], MD  aspirin 81 MG chewable tablet Chew 1 tablet (81 mg total) by mouth daily. 05/19/17   [name], [name], MD  atorvastatin (LIPITOR) 10 MG tablet TAKE 1 TABLET BY MOUTH DAILY AT [facility] 01/20/22   [name], [name], MD  buPROPion (WELLBUTRIN SR) 150 MG 12 hr tablet TAKE 1 TABLET BY MOUTH TWICE DAILY 09/24/22   [name], [name], MD  Cholecalciferol (EQL VITAMIN D3) 1000 units tablet Take 1 tablet (1,000 Units total) by mouth daily. 05/19/17   [name], [name], MD  citalopram (CELEXA) 40 MG tablet TAKE 1 TABLET(40 MG) BY MOUTH DAILY 09/04/22   [name], [name], MD  fenofibrate (TRICOR) 145 MG tablet Take 1 tablet (145 mg total)  by mouth daily. 09/24/22   [name], [name], MD  Ferric Maltol (ACCRUFER) 30 MG CAPS 1 po bid 09/24/22   [name], [name], MD  gabapentin (NEURONTIN) 100 MG capsule Take 1 capsule (100 mg total) by mouth 3 (three) times daily. 08/15/21   [name], [name], MD  GLYXAMBI 25-5 MG TABS Take 1 tablet by mouth daily. Take 1 tablet by mouth daily. 01/20/22   [name], [name], MD  metFORMIN (GLUCOPHAGE) 500 MG tablet Take 2 tablets (1,000 mg total) by mouth 2 (two) times daily with a meal. Overdue for follow-up must see MD for future refills 03/31/22   [name], [name], MD  ondansetron (ZOFRAN ODT) 4 MG disintegrating tablet Take 1 tablet (4 mg total) by mouth every 8 (eight) hours as needed for nausea or vomiting. 08/08/21   Brunetta Jeans, PA-C  OZEMPIC, 2 MG/DOSE, 8 MG/3ML SOPN INJECT '2MG'$  INTO THE SKIN ONCE A WEEK 12/05/22   [name], [name], MD  pantoprazole (PROTONIX) 40 MG tablet Take 1 tablet (40 mg total) by mouth 2 (two) times daily. 01/20/22   [name], [name], MD  repaglinide (PRANDIN) 2 MG tablet TAKE 1 TABLET BY MOUTH THREE TIMES DAILY BEFORE MEALS 01/20/22   [name], [name], MD      Allergies    Patient has no known allergies.    Review of Systems   Review of Systems  Cardiovascular:  Positive for palpitations.  Physical Exam Updated Vital Signs BP (!) 158/91   Pulse (!) 101   Temp 98.3 F (36.8 C) (Oral)   Resp 14   Ht '5\' 8"'$  (1.727 m)   Wt 81.6 kg   SpO2 98%   BMI 27.37 kg/m  Physical Exam Vitals and nursing note reviewed.  Constitutional:      General: She is not in acute distress.    Appearance: Normal appearance. She is obese.  HENT:     Head: Normocephalic and atraumatic.     Nose: Nose normal.     Mouth/Throat:     Mouth: Mucous membranes are moist.     Pharynx: Oropharynx is clear.  Eyes:     Extraocular Movements: Extraocular movements intact.     Conjunctiva/sclera: Conjunctivae normal.     Pupils: Pupils are equal, round, and  reactive to light.  Cardiovascular:     Rate and Rhythm: Regular rhythm. Tachycardia present.     Heart sounds: Normal heart sounds.  Pulmonary:     Effort: Pulmonary effort is normal.     Breath sounds: Normal breath sounds.  Abdominal:     General: Abdomen is flat.     Palpations: Abdomen is soft.     Tenderness: There is no abdominal tenderness.  Musculoskeletal:        General: Normal range of motion.     Cervical back: Normal range of motion and neck supple.     Right lower leg: No edema.     Left lower leg: No edema.  Skin:    General: Skin is warm and dry.  Neurological:     General: No focal deficit present.     Mental Status: She is alert and oriented to person, place, and time.     Cranial Nerves: No cranial nerve deficit.     Sensory: No sensory deficit.     Motor: No weakness.     Coordination: Coordination normal.  Psychiatric:        Mood and Affect: Mood normal.        Behavior: Behavior normal.     ED Results / Procedures / Treatments   Labs (all labs ordered are listed, but only abnormal results are displayed) Labs Reviewed  CBC WITH DIFFERENTIAL/PLATELET - Abnormal; Notable for the following components:      Result Value   WBC 11.1 (*)    MCH 25.8 (*)    RDW 15.8 (*)    All other components within normal limits  COMPREHENSIVE METABOLIC PANEL - Abnormal; Notable for the following components:   CO2 21 (*)    Glucose, Bld 117 (*)    All other components within normal limits  D-DIMER, QUANTITATIVE - Abnormal; Notable for the following components:   D-Dimer, Quant 1.83 (*)    All other components within normal limits  MAGNESIUM  TSH  TROPONIN I (HIGH SENSITIVITY)  TROPONIN I (HIGH SENSITIVITY)    EKG EKG Interpretation  Date/Time:  Saturday January 03 2023 17:30:42 EST Ventricular Rate:  117 PR Interval:  129 QRS Duration: 154 QT Interval:  366 QTC Calculation: 509 R Axis:   -44 Text Interpretation: Sinus tachycardia Paired ventricular  premature complexes Right bundle branch block Inferior infarct, old RBBB and frequent PVCs new compared to prior EKG Confirmed by Leanord Asal ([id number]) on 01/03/2023 6:57:58 PM  Radiology DG Chest 2 View  Result Date: 01/03/2023 CLINICAL DATA:  chest pain EXAM: CHEST - 2 VIEW COMPARISON:  January 12, 2009 FINDINGS: The cardiomediastinal silhouette is  unchanged in contour. No pleural effusion. No pneumothorax. No acute pleuroparenchymal abnormality. Visualized abdomen is unremarkable. Multilevel degenerative changes of the thoracic spine. IMPRESSION: No acute cardiopulmonary abnormality. Electronically Signed   By: Valentino Saxon M.D.   On: 01/03/2023 18:15    Procedures Procedures    Medications Ordered in ED Medications  ketorolac (TORADOL) 15 MG/ML injection 15 mg (15 mg Intravenous Given 01/03/23 2026)  metoCLOPramide (REGLAN) injection 10 mg (10 mg Intravenous Given 01/03/23 2026)  acetaminophen (TYLENOL) tablet 1,000 mg (1,000 mg Oral Given 01/03/23 2024)  lactated ringers bolus 1,000 mL (1,000 mLs Intravenous New Bag/Given 01/03/23 2027)    ED Course/ Medical Decision Making/ A&P Clinical Course as of 01/03/23 2136  Sat Jan 03, 2023  2045 D-dimer is elevated to 1.8 and CTPE study will be performed. [VK]  2129 The patient has decided to leave against medical advice.  The patient had a normal mental status examination and understands her condition and the risks of leaving including blood clots, worsening cardiac function and death.  The patient has had an opportunity to ask  questions about his/her medical condition. The patient has been informed  that she may return for care at any time and has been referred to her   physician.   [VK]    Clinical Course User Index [VK] Kemper Durie, DO                             Medical Decision Making This patient presents to the ED with chief complaint(s) of headache, palpitations, dizziness with pertinent past medical history  of diabetes, CHF, anxiety which further complicates the presenting complaint. The complaint involves an extensive differential diagnosis and also carries with it a high risk of complications and morbidity.    The differential diagnosis includes patient has no focal neurologic deficits, headache is similar to prior in the past and has not had any vomiting making ICH or mass effect unlikely, considering arrhythmia, anemia, ACS, electrolyte abnormality, dehydration, patient has no known PE risk factors but is tachycardic concerning for possible PE  Additional history obtained: Additional history obtained from N/A Records reviewed Primary Care Documents  ED Course and Reassessment: She was initially evaluated by provider in triage and had EKG and labs including troponin performed.  EKG showed normal sinus rhythm with frequent PVCs but no ischemic changes.  She has had negative troponin.  Patient's labs are otherwise within normal range including normal mag and potassium.  Patient's chest x-ray shows no acute disease.  She has been persistently tachycardic and will have D-dimer performed to assess for PE.  She will be treated with migraine cocktail and will be closely reassessed.  Independent labs interpretation:  The following labs were independently interpreted: Elevated D-dimer, otherwise within normal range  Independent visualization of imaging: - I independently visualized the following imaging with scope of interpretation limited to determining acute life threatening conditions related to emergency care: Chest x-ray, which revealed no acute disease  Consultation: - Consulted or discussed management/test interpretation w/ external professional: N/A  Consideration for admission or further workup: Patient was recommended further workup with CT PE study, however left AGAINST MEDICAL ADVICE prior to completion of her workup Social Determinants of health: N/A    Amount and/or Complexity of Data  Reviewed Labs: ordered. Radiology: ordered.  Risk OTC drugs. Prescription drug management.          Final Clinical Impression(s) / ED Diagnoses Final diagnoses:  Left against medical advice  PVC's (premature ventricular contractions)  Positive D dimer    Rx / DC Orders ED Discharge Orders     None         Kemper Durie, DO 01/03/23 2136

## 2023-01-04 LAB — TSH: TSH: 2.263 u[IU]/mL (ref 0.350–4.500)

## 2023-01-06 ENCOUNTER — Ambulatory Visit (INDEPENDENT_AMBULATORY_CARE_PROVIDER_SITE_OTHER): Payer: BC Managed Care – PPO | Admitting: Internal Medicine

## 2023-01-06 ENCOUNTER — Encounter: Payer: Self-pay | Admitting: Internal Medicine

## 2023-01-06 VITALS — BP 122/80 | HR 118 | Temp 98.6°F | Ht 68.0 in | Wt 192.0 lb

## 2023-01-06 DIAGNOSIS — R002 Palpitations: Secondary | ICD-10-CM

## 2023-01-06 DIAGNOSIS — R000 Tachycardia, unspecified: Secondary | ICD-10-CM | POA: Diagnosis not present

## 2023-01-06 DIAGNOSIS — E119 Type 2 diabetes mellitus without complications: Secondary | ICD-10-CM | POA: Insufficient documentation

## 2023-01-06 DIAGNOSIS — L292 Pruritus vulvae: Secondary | ICD-10-CM | POA: Insufficient documentation

## 2023-01-06 DIAGNOSIS — N920 Excessive and frequent menstruation with regular cycle: Secondary | ICD-10-CM | POA: Insufficient documentation

## 2023-01-06 DIAGNOSIS — J4521 Mild intermittent asthma with (acute) exacerbation: Secondary | ICD-10-CM | POA: Diagnosis not present

## 2023-01-06 DIAGNOSIS — E669 Obesity, unspecified: Secondary | ICD-10-CM

## 2023-01-06 DIAGNOSIS — A084 Viral intestinal infection, unspecified: Secondary | ICD-10-CM | POA: Diagnosis not present

## 2023-01-06 DIAGNOSIS — E1169 Type 2 diabetes mellitus with other specified complication: Secondary | ICD-10-CM | POA: Diagnosis not present

## 2023-01-06 MED ORDER — ALPRAZOLAM 0.5 MG PO TABS: ORAL_TABLET | ORAL | 0 refills | Status: DC

## 2023-01-06 MED ORDER — PANTOPRAZOLE SODIUM 40 MG PO TBEC: 40.0000 mg | DELAYED_RELEASE_TABLET | Freq: Two times a day (BID) | ORAL | 3 refills | Status: DC

## 2023-01-06 MED ORDER — ONDANSETRON 4 MG PO TBDP: 4.0000 mg | ORAL_TABLET | Freq: Three times a day (TID) | ORAL | 0 refills | Status: DC | PRN

## 2023-01-06 MED ORDER — OZEMPIC (2 MG/DOSE) 8 MG/3ML ~~LOC~~ SOPN: PEN_INJECTOR | SUBCUTANEOUS | 0 refills | Status: DC

## 2023-01-06 MED ORDER — ALBUTEROL SULFATE HFA 108 (90 BASE) MCG/ACT IN AERS: 2.0000 | INHALATION_SPRAY | RESPIRATORY_TRACT | 3 refills | Status: DC | PRN

## 2023-01-06 MED ORDER — ACCRUFER 30 MG PO CAPS: ORAL_CAPSULE | ORAL | 5 refills | Status: DC

## 2023-01-06 MED ORDER — METFORMIN HCL 500 MG PO TABS: 1000.0000 mg | ORAL_TABLET | Freq: Two times a day (BID) | ORAL | 0 refills | Status: DC

## 2023-01-06 MED ORDER — ATORVASTATIN CALCIUM 10 MG PO TABS: ORAL_TABLET | ORAL | 3 refills | Status: DC

## 2023-01-06 MED ORDER — GLYXAMBI 25-5 MG PO TABS: 1.0000 | ORAL_TABLET | Freq: Every day | ORAL | 3 refills | Status: DC

## 2023-01-06 MED ORDER — GABAPENTIN 100 MG PO CAPS: 100.0000 mg | ORAL_CAPSULE | Freq: Three times a day (TID) | ORAL | 3 refills | Status: DC

## 2023-01-06 NOTE — Patient Instructions (Addendum)
[facility] Emergency room  Address: [address], [place], [place] [address] Hours: Open 24 hours Phone: [phone]   [facility] at [facility] Address: [address], [place], [place] [address] Hours: Open 24 hours Phone: [phone]    Hold Wellbutrin if issues  Off work 1/17-1/29/24

## 2023-01-06 NOTE — Assessment & Plan Note (Addendum)
On Ozempic and Glyxambi w/Metformin Obtain A1c

## 2023-01-06 NOTE — Assessment & Plan Note (Addendum)
Unclear etiology.  Could be related to Wellbutrin use.  Put Wellbutrin on hold for a week or 2. ER notes, labs were reviewed.  Will treat asthmatic bronchitis. Stay at home.  Okay to go back to work on 01/19/23

## 2023-01-06 NOTE — Assessment & Plan Note (Signed)
S tachy - s/p ER visit Hold Wellbutrin if issues

## 2023-01-06 NOTE — Assessment & Plan Note (Signed)
Worse Ventolin prn

## 2023-01-06 NOTE — Progress Notes (Signed)
Subjective:  Patient ID: [name] [name], female    DOB: 1966/01/12  Age: 57 y.o. MRN: [id number]  CC: No chief complaint on file.   HPI [name] [name] presents for palpitations, stress is worse, DM.  The patient had to go to emergency room due to chest tightness and palpitations on 01/03/2023.   Outpatient Medications Prior to Visit  Medication Sig Dispense Refill   aspirin 81 MG chewable tablet Chew 1 tablet (81 mg total) by mouth daily. 90 tablet 1   buPROPion (WELLBUTRIN SR) 150 MG 12 hr tablet TAKE 1 TABLET BY MOUTH TWICE DAILY 180 tablet 3   Cholecalciferol (EQL VITAMIN D3) 1000 units tablet Take 1 tablet (1,000 Units total) by mouth daily. 90 tablet 1   citalopram (CELEXA) 40 MG tablet TAKE 1 TABLET(40 MG) BY MOUTH DAILY 90 tablet 3   repaglinide (PRANDIN) 2 MG tablet TAKE 1 TABLET BY MOUTH THREE TIMES DAILY BEFORE MEALS 270 tablet 3   ALPRAZolam (XANAX) 0.5 MG tablet TAKE 1 TABLET(0.5 MG) BY MOUTH TWICE DAILY 180 tablet 0   atorvastatin (LIPITOR) 10 MG tablet TAKE 1 TABLET BY MOUTH DAILY AT 6PM 90 tablet 3   fenofibrate (TRICOR) 145 MG tablet Take 1 tablet (145 mg total) by mouth daily. 90 tablet 3   Ferric Maltol (ACCRUFER) 30 MG CAPS 1 po bid 60 capsule 5   gabapentin (NEURONTIN) 100 MG capsule Take 1 capsule (100 mg total) by mouth 3 (three) times daily. 90 capsule 3   GLYXAMBI 25-5 MG TABS Take 1 tablet by mouth daily. Take 1 tablet by mouth daily. 90 tablet 3   metFORMIN (GLUCOPHAGE) 500 MG tablet Take 2 tablets (1,000 mg total) by mouth 2 (two) times daily with a meal. Overdue for follow-up must see MD for future refills 360 tablet 0   ondansetron (ZOFRAN ODT) 4 MG disintegrating tablet Take 1 tablet (4 mg total) by mouth every 8 (eight) hours as needed for nausea or vomiting. 20 tablet 0   OZEMPIC, 2 MG/DOSE, 8 MG/3ML SOPN INJECT '2MG'$  INTO THE SKIN ONCE A WEEK 3 mL 0   pantoprazole (PROTONIX) 40 MG tablet Take 1 tablet (40 mg total) by mouth 2 (two) times daily. 180 tablet  3   No facility-administered medications prior to visit.    ROS: Review of Systems  Constitutional:  Positive for fatigue. Negative for activity change, appetite change, chills and unexpected weight change.  HENT:  Negative for congestion, mouth sores and sinus pressure.   Eyes:  Negative for visual disturbance.  Respiratory:  Negative for cough and chest tightness.   Cardiovascular:  Positive for chest pain and palpitations.  Gastrointestinal:  Negative for abdominal pain and nausea.  Genitourinary:  Negative for difficulty urinating, frequency and vaginal pain.  Musculoskeletal:  Positive for arthralgias and back pain. Negative for gait problem.  Skin:  Negative for pallor and rash.  Neurological:  Negative for dizziness, tremors, weakness, numbness and headaches.  Psychiatric/Behavioral:  Positive for decreased concentration and dysphoric mood. Negative for confusion, sleep disturbance and suicidal ideas. The patient is nervous/anxious.     Objective:  BP 122/80 (BP Location: Left Arm, Patient Position: Sitting, Cuff Size: Large)   Pulse (!) 118   Temp 98.6 F (37 C) (Oral)   Ht '5\' 8"'$  (1.727 m)   Wt 192 lb (87.1 kg)   SpO2 96%   BMI 29.19 kg/m   BP Readings from Last 3 Encounters:  01/06/23 122/80  01/03/23 (!) 158/91  08/14/22 138/78  Wt Readings from Last 3 Encounters:  01/06/23 192 lb (87.1 kg)  01/03/23 180 lb (81.6 kg)  08/14/22 193 lb 9.6 oz (87.8 kg)    Physical Exam Constitutional:      General: She is not in acute distress.    Appearance: She is well-developed. She is obese.  HENT:     Head: Normocephalic.     Right Ear: External ear normal.     Left Ear: External ear normal.     Nose: Nose normal.  Eyes:     General:        Right eye: No discharge.        Left eye: No discharge.     Conjunctiva/sclera: Conjunctivae normal.     Pupils: Pupils are equal, round, and reactive to light.  Neck:     Thyroid: No thyromegaly.     Vascular: No JVD.      Trachea: No tracheal deviation.  Cardiovascular:     Rate and Rhythm: Normal rate and regular rhythm.     Heart sounds: Normal heart sounds.  Pulmonary:     Effort: No respiratory distress.     Breath sounds: No stridor. No wheezing.  Abdominal:     General: Bowel sounds are normal. There is no distension.     Palpations: Abdomen is soft. There is no mass.     Tenderness: There is no abdominal tenderness. There is no guarding or rebound.  Musculoskeletal:        General: No tenderness.     Cervical back: Normal range of motion and neck supple. No rigidity.  Lymphadenopathy:     Cervical: No cervical adenopathy.  Skin:    Findings: No erythema or rash.  Neurological:     Mental Status: She is oriented to person, place, and time.     Cranial Nerves: No cranial nerve deficit.     Motor: No abnormal muscle tone.     Coordination: Coordination normal.     Deep Tendon Reflexes: Reflexes normal.  Psychiatric:        Behavior: Behavior normal.        Thought Content: Thought content normal.        Judgment: Judgment normal.   Slightly tachycardic  Lab Results  Component Value Date   WBC 11.1 (H) 01/03/2023   HGB 13.0 01/03/2023   HCT 41.9 01/03/2023   PLT 229 01/03/2023   GLUCOSE 117 (H) 01/03/2023   CHOL 156 08/11/2019   TRIG (H) 08/11/2019    554.0 Triglyceride is over 400; calculations on Lipids are invalid.   HDL 28.10 (L) 08/11/2019   LDLDIRECT 69.0 08/11/2019   LDLCALC 97 10/23/2017   ALT 23 01/03/2023   AST 24 01/03/2023   NA 137 01/03/2023   K 4.1 01/03/2023   CL 105 01/03/2023   CREATININE 0.60 01/03/2023   BUN 11 01/03/2023   CO2 21 (L) 01/03/2023   TSH 2.263 01/03/2023   HGBA1C 7.0 (H) 08/15/2022   MICROALBUR 2.9 (H) 07/31/2020    DG Chest 2 View  Result Date: 01/03/2023 CLINICAL DATA:  chest pain EXAM: CHEST - 2 VIEW COMPARISON:  January 12, 2009 FINDINGS: The cardiomediastinal silhouette is unchanged in contour. No pleural effusion. No pneumothorax. No  acute pleuroparenchymal abnormality. Visualized abdomen is unremarkable. Multilevel degenerative changes of the thoracic spine. IMPRESSION: No acute cardiopulmonary abnormality. Electronically Signed   By: Valentino Saxon M.D.   On: 01/03/2023 18:15    Assessment & Plan:   Problem List Items Addressed  This Visit       Respiratory   Asthmatic bronchitis    Worse Ventolin prn      Relevant Medications   albuterol (VENTOLIN HFA) 108 (90 Base) MCG/ACT inhaler     Endocrine   Diabetes mellitus type 2 in obese (HCC) - Primary    On Ozempic and Glyxambi w/Metformin Obtain A1c      Relevant Medications   atorvastatin (LIPITOR) 10 MG tablet   GLYXAMBI 25-5 MG TABS   metFORMIN (GLUCOPHAGE) 500 MG tablet   Semaglutide, 2 MG/DOSE, (OZEMPIC, 2 MG/DOSE,) 8 MG/3ML SOPN     Other   Tachycardia    S tachy - s/p ER visit Hold Wellbutrin if issues      Palpitations    Unclear etiology.  Could be related to Wellbutrin use.  Put Wellbutrin on hold for a week or 2. ER notes, labs were reviewed.  Will treat asthmatic bronchitis. Stay at home.  Okay to go back to work on 01/19/23      Other Visit Diagnoses     Viral gastroenteritis       Relevant Medications   ondansetron (ZOFRAN ODT) 4 MG disintegrating tablet         Meds ordered this encounter  Medications   ALPRAZolam (XANAX) 0.5 MG tablet    Sig: TAKE 1 TABLET(0.5 MG) BY MOUTH TWICE DAILY    Dispense:  180 tablet    Refill:  0   atorvastatin (LIPITOR) 10 MG tablet    Sig: TAKE 1 TABLET BY MOUTH DAILY AT 6PM    Dispense:  90 tablet    Refill:  3   GLYXAMBI 25-5 MG TABS    Sig: Take 1 tablet by mouth daily. Take 1 tablet by mouth daily.    Dispense:  90 tablet    Refill:  3   Ferric Maltol (ACCRUFER) 30 MG CAPS    Sig: 1 po bid    Dispense:  60 capsule    Refill:  5    Intolerant of oral iron   gabapentin (NEURONTIN) 100 MG capsule    Sig: Take 1 capsule (100 mg total) by mouth 3 (three) times daily.    Dispense:   90 capsule    Refill:  3   metFORMIN (GLUCOPHAGE) 500 MG tablet    Sig: Take 2 tablets (1,000 mg total) by mouth 2 (two) times daily with a meal. Overdue for follow-up must see MD for future refills    Dispense:  360 tablet    Refill:  0   ondansetron (ZOFRAN ODT) 4 MG disintegrating tablet    Sig: Take 1 tablet (4 mg total) by mouth every 8 (eight) hours as needed for nausea or vomiting.    Dispense:  20 tablet    Refill:  0   pantoprazole (PROTONIX) 40 MG tablet    Sig: Take 1 tablet (40 mg total) by mouth 2 (two) times daily.    Dispense:  180 tablet    Refill:  3   Semaglutide, 2 MG/DOSE, (OZEMPIC, 2 MG/DOSE,) 8 MG/3ML SOPN    Sig: INJECT '2MG'$  INTO THE SKIN ONCE A WEEK    Dispense:  3 mL    Refill:  0   albuterol (VENTOLIN HFA) 108 (90 Base) MCG/ACT inhaler    Sig: Inhale 2 puffs into the lungs every 4 (four) hours as needed for wheezing or shortness of breath.    Dispense:  8 g    Refill:  3  Follow-up: Return in about 3 months (around 04/07/2023) for a follow-up visit.  [name], MD

## 2023-02-13 ENCOUNTER — Other Ambulatory Visit: Payer: Self-pay

## 2023-02-13 ENCOUNTER — Ambulatory Visit (AMBULATORY_SURGERY_CENTER): Payer: BC Managed Care – PPO

## 2023-02-13 VITALS — Ht 68.0 in | Wt 190.0 lb

## 2023-02-13 DIAGNOSIS — Z86010 Personal history of colonic polyps: Secondary | ICD-10-CM

## 2023-02-13 MED ORDER — NA SULFATE-K SULFATE-MG SULF 17.5-3.13-1.6 GM/177ML PO SOLN: 1.0000 | Freq: Once | ORAL | 0 refills | Status: AC

## 2023-02-13 NOTE — Progress Notes (Signed)
Denies allergies to eggs or soy products. Denies complication of anesthesia or sedation. Denies use of weight loss medication. Denies use of O2.   Emmi instructions given for colonoscopy.  

## 2023-02-16 ENCOUNTER — Other Ambulatory Visit: Payer: Self-pay | Admitting: Internal Medicine

## 2023-02-20 ENCOUNTER — Encounter: Payer: Self-pay | Admitting: Internal Medicine

## 2023-03-10 ENCOUNTER — Other Ambulatory Visit: Payer: Self-pay | Admitting: Internal Medicine

## 2023-03-10 MED ORDER — ATORVASTATIN CALCIUM 10 MG PO TABS: ORAL_TABLET | ORAL | 3 refills | Status: DC

## 2023-03-10 NOTE — Addendum Note (Signed)
Addended by: Earnstine Regal on: 03/10/2023 09:19 AM   Modules accepted: Orders

## 2023-03-17 ENCOUNTER — Encounter: Payer: Self-pay | Admitting: Internal Medicine

## 2023-03-17 ENCOUNTER — Ambulatory Visit (AMBULATORY_SURGERY_CENTER): Payer: BC Managed Care – PPO | Admitting: Internal Medicine

## 2023-03-17 VITALS — BP 159/77 | HR 87 | Temp 98.7°F | Resp 19 | Ht 68.0 in | Wt 190.0 lb

## 2023-03-17 DIAGNOSIS — D123 Benign neoplasm of transverse colon: Secondary | ICD-10-CM

## 2023-03-17 DIAGNOSIS — D128 Benign neoplasm of rectum: Secondary | ICD-10-CM

## 2023-03-17 DIAGNOSIS — K635 Polyp of colon: Secondary | ICD-10-CM

## 2023-03-17 DIAGNOSIS — D120 Benign neoplasm of cecum: Secondary | ICD-10-CM

## 2023-03-17 DIAGNOSIS — Z86010 Personal history of colonic polyps: Secondary | ICD-10-CM

## 2023-03-17 DIAGNOSIS — D124 Benign neoplasm of descending colon: Secondary | ICD-10-CM

## 2023-03-17 DIAGNOSIS — D122 Benign neoplasm of ascending colon: Secondary | ICD-10-CM

## 2023-03-17 DIAGNOSIS — D127 Benign neoplasm of rectosigmoid junction: Secondary | ICD-10-CM

## 2023-03-17 DIAGNOSIS — Z09 Encounter for follow-up examination after completed treatment for conditions other than malignant neoplasm: Secondary | ICD-10-CM | POA: Diagnosis not present

## 2023-03-17 DIAGNOSIS — D125 Benign neoplasm of sigmoid colon: Secondary | ICD-10-CM | POA: Diagnosis not present

## 2023-03-17 MED ORDER — SODIUM CHLORIDE 0.9 % IV SOLN: 500.0000 mL | INTRAVENOUS | Status: DC

## 2023-03-17 NOTE — Progress Notes (Signed)
Called to room to assist during endoscopic procedure.  Patient ID and intended procedure confirmed with present staff. Received instructions for my participation in the procedure from the performing physician.  

## 2023-03-17 NOTE — Op Note (Signed)
[place] Patient Name: [name] [name] Procedure Date: 03/17/2023 8:28 AM MRN: [id number] Endoscopist: Docia Chuck. Henrene Pastor , MD, [id number] Age: 57 Referring MD:  Date of Birth: 1966-07-20 Gender: Female Account #: [id number] Procedure:                Colonoscopy with cold snare polypectomy x 9; biopsy                            polypectomy x 1 Indications:              High risk colon cancer surveillance: Personal                            history of multiple (3 or more) adenomas. Previous                            examination 2020 Medicines:                Monitored Anesthesia Care Procedure:                Pre-Anesthesia Assessment:                           - Prior to the procedure, a History and Physical                            was performed, and patient medications and                            allergies were reviewed. The patient's tolerance of                            previous anesthesia was also reviewed. The risks                            and benefits of the procedure and the sedation                            options and risks were discussed with the patient.                            All questions were answered, and informed consent                            was obtained. Prior Anticoagulants: The patient has                            taken no anticoagulant or antiplatelet agents. ASA                            Grade Assessment: II - A patient with mild systemic                            disease. After reviewing the risks and benefits,  the patient was deemed in satisfactory condition to                            undergo the procedure.                           After obtaining informed consent, the colonoscope                            was passed under direct vision. Throughout the                            procedure, the patient's blood pressure, pulse, and                            oxygen saturations were monitored  continuously. The                            Olympus Scope [id number] was introduced through the                            anus and advanced to the the cecum, identified by                            appendiceal orifice and ileocecal valve. The                            ileocecal valve, appendiceal orifice, and rectum                            were photographed. The quality of the bowel                            preparation was excellent. The colonoscopy was                            performed without difficulty. The patient tolerated                            the procedure well. The bowel preparation used was                            SUPREP via split dose instruction. Scope In: 8:38:29 AM Scope Out: 9:02:10 AM Scope Withdrawal Time: 0 hours 17 minutes 4 seconds  Total Procedure Duration: 0 hours 23 minutes 41 seconds  Findings:                 A 1 mm polyp was found in the cecum. The polyp was                            removed with a jumbo cold forceps. Resection and                            retrieval were complete.  Nine polyps were found in the rectum, sigmoid                            colon, descending colon, transverse colon, hepatic                            flexure and ascending colon. The polyps were 1 to 5                            mm in size. These polyps were removed with a cold                            snare. Resection and retrieval were complete.                           The exam was otherwise without abnormality on                            direct and retroflexion views. Complications:            No immediate complications. Estimated blood loss:                            None. Estimated Blood Loss:     Estimated blood loss: none. Impression:               - One 1 mm polyp in the cecum, removed with a jumbo                            cold forceps. Resected and retrieved.                           - Nine 1 to 5 mm polyps in the  rectum, in the                            sigmoid colon, in the descending colon, in the                            transverse colon, at the hepatic flexure and in the                            ascending colon, removed with a cold snare.                            Resected and retrieved.                           - The examination was otherwise normal on direct                            and retroflexion views. Recommendation:           - Repeat colonoscopy in 3 years for surveillance.                           -  Patient has a contact number available for                            emergencies. The signs and symptoms of potential                            delayed complications were discussed with the                            patient. Return to normal activities tomorrow.                            Written discharge instructions were provided to the                            patient.                           - Resume previous diet.                           - Continue present medications.                           - Await pathology results. Docia Chuck. Henrene Pastor, MD 03/17/2023 9:21:31 AM This report has been signed electronically.

## 2023-03-17 NOTE — Progress Notes (Signed)
A and O x3. Report to RN. Tolerated MAC anesthesia well. 

## 2023-03-17 NOTE — Progress Notes (Signed)
HISTORY OF PRESENT ILLNESS:  [name] [name] is a 57 y.o. female with a history of multiple adenomatous colon polyps on colonoscopy 2020.  Now for surveillance.  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 Her smoking use included cigarettes. She has a 7.50 pack-year smoking history. She has never used smokeless tobacco. She reports that she does not drink alcohol and does not use drugs.  family history includes Colon cancer in her paternal aunt; Colon polyps in her maternal grandmother; Diabetes in her mother; Hypertension in her mother and another family member; Inflammatory bowel disease in her maternal aunt.  No Known Allergies     PHYSICAL EXAMINATION: Vital signs: BP 132/67   Pulse 88   Temp 98.7 F (37.1 C)   Resp 20   Ht 5\' 8"  (1.727 m)   Wt 190 lb (86.2 kg)   LMP 03/02/2017   SpO2 96%   BMI 28.89 kg/m  General: Well-developed, well-nourished, no acute distress HEENT: Sclerae are anicteric, conjunctiva pink. Oral mucosa intact Lungs: Clear Heart: Regular Abdomen: soft, nontender, nondistended, no obvious ascites, no peritoneal signs, normal bowel sounds. No organomegaly. Extremities: No edema Psychiatric: alert and oriented x3. Cooperative     ASSESSMENT:   History of multiple adenomatous polyps  PLAN:  Surveillance colonoscopy

## 2023-03-17 NOTE — Patient Instructions (Addendum)
YOU HAD AN ENDOSCOPIC PROCEDURE TODAY AT [facility]:   Refer to the procedure report that was given to you for any specific questions about what was found during the examination.  If the procedure report does not answer your questions, please call your gastroenterologist to clarify.  If you requested that your care partner not be given the details of your procedure findings, then the procedure report has been included in a sealed envelope for you to review at your convenience later.  Resume previous diet. Continue present medications. Await pathology results- next colonoscopy 3 years    YOU SHOULD EXPECT: Some feelings of bloating in the abdomen. Passage of more gas than usual.  Walking can help get rid of the air that was put into your GI tract during the procedure and reduce the bloating. If you had a lower endoscopy (such as a colonoscopy or flexible sigmoidoscopy) you may notice spotting of blood in your stool or on the toilet paper. If you underwent a bowel prep for your procedure, you may not have a normal bowel movement for a few days.  Please Note:  You might notice some irritation and congestion in your nose or some drainage.  This is from the oxygen used during your procedure.  There is no need for concern and it should clear up in a day or so.  SYMPTOMS TO REPORT IMMEDIATELY:  Following lower endoscopy (colonoscopy or flexible sigmoidoscopy):  Excessive amounts of blood in the stool  Significant tenderness or worsening of abdominal pains  Swelling of the abdomen that is new, acute  Fever of 100F or higher  For urgent or emergent issues, a gastroenterologist can be reached at any hour by calling [phone]. Do not use MyChart messaging for urgent concerns.    DIET:  We do recommend a small meal at first, but then you may proceed to your regular diet.  Drink plenty of fluids but you should avoid alcoholic beverages for 24 hours.  ACTIVITY:  You should plan to  take it easy for the rest of today and you should NOT DRIVE or use heavy machinery until tomorrow (because of the sedation medicines used during the test).    FOLLOW UP: Our staff will call the number listed on your records the next business day following your procedure.  We will call around 7:15- 8:00 am to check on you and address any questions or concerns that you may have regarding the information given to you following your procedure. If we do not reach you, we will leave a message.     If any biopsies were taken you will be contacted by phone or by letter within the next 1-3 weeks.  Please call us at [phone] if you have not heard about the biopsies in 3 weeks.    SIGNATURES/CONFIDENTIALITY: You and/or your care partner have signed paperwork which will be entered into your electronic medical record.  These signatures attest to the fact that that the information above on your After Visit Summary has been reviewed and is understood.  Full responsibility of the confidentiality of this discharge information lies with you and/or your care-partner.

## 2023-03-18 ENCOUNTER — Telehealth: Payer: Self-pay | Admitting: *Deleted

## 2023-03-18 NOTE — Telephone Encounter (Signed)
  Follow up Call-     03/17/2023    7:18 AM  Call back number  Post procedure Call Back phone  # [phone]  Permission to leave phone message Yes     Patient questions:  Do you have a fever, pain , or abdominal swelling? No. Pain Score  0 *  Have you tolerated food without any problems? Yes.    Have you been able to return to your normal activities? Yes.    Do you have any questions about your discharge instructions: Diet   No. Medications  No. Follow up visit  No.  Do you have questions or concerns about your Care? No.  Actions: * If pain score is 4 or above: No action needed, pain <4.

## 2023-03-19 ENCOUNTER — Encounter: Payer: Self-pay | Admitting: Internal Medicine

## 2023-04-07 ENCOUNTER — Ambulatory Visit (INDEPENDENT_AMBULATORY_CARE_PROVIDER_SITE_OTHER): Payer: BC Managed Care – PPO | Admitting: Internal Medicine

## 2023-04-07 VITALS — BP 130/80 | HR 100 | Temp 98.9°F | Ht 68.0 in | Wt 196.0 lb

## 2023-04-07 DIAGNOSIS — K635 Polyp of colon: Secondary | ICD-10-CM | POA: Diagnosis not present

## 2023-04-07 DIAGNOSIS — E1169 Type 2 diabetes mellitus with other specified complication: Secondary | ICD-10-CM | POA: Diagnosis not present

## 2023-04-07 DIAGNOSIS — E669 Obesity, unspecified: Secondary | ICD-10-CM | POA: Diagnosis not present

## 2023-04-07 DIAGNOSIS — E785 Hyperlipidemia, unspecified: Secondary | ICD-10-CM | POA: Insufficient documentation

## 2023-04-07 DIAGNOSIS — R5383 Other fatigue: Secondary | ICD-10-CM | POA: Diagnosis not present

## 2023-04-07 NOTE — Patient Instructions (Signed)
Cardiac CT calcium scoring test $99    Computed tomography, more commonly known as a CT or CAT scan, is a diagnostic medical imaging test. Like traditional x-rays, it produces multiple images or pictures of the inside of the body. The cross-sectional images generated during a CT scan can be reformatted in multiple planes. They can even generate three-dimensional images. These images can be viewed on a computer monitor. CT images of internal organs, bones, soft tissue and blood vessels provide greater detail than traditional x-rays, particularly of soft tissues and blood vessels. A cardiac CT scan for coronary calcium is a non-invasive way of obtaining information about the presence, location and extent of calcified plaque in the coronary arteries--the vessels that supply oxygen-containing blood to the heart muscle. Calcified plaque results when there is a build-up of fat and other substances under the inner layer of the artery. This material can calcify which signals the presence of atherosclerosis, a disease of the vessel wall, also called coronary artery disease (CAD). People with this disease have an increased risk for heart attacks. In addition, over time, progression of plaque build up (CAD) can narrow the arteries or even close off blood flow to the heart. The result may be chest pain, sometimes called "angina," or a heart attack. Because calcium is a marker of CAD, the amount of calcium detected on a cardiac CT scan is a helpful prognostic tool. The findings on cardiac CT are expressed as a calcium score. Another name for this test is coronary artery calcium scoring.  What are some common uses of the procedure? The goal of cardiac CT scan for calcium scoring is to determine if CAD is present and to what extent, even if there are no symptoms. It is a screening study that may be recommended by a physician for patients with risk factors for CAD but no clinical symptoms. The major risk factors for CAD  are: high blood cholesterol levels  family history of heart attacks  diabetes  high blood pressure  cigarette smoking  overweight or obese  physical inactivity   A negative cardiac CT scan for calcium scoring shows no calcification within the coronary arteries. This suggests that CAD is absent or so minimal it cannot be seen by this technique. The chance of having a heart attack over the next two to five years is very low under these circumstances. A positive test means that CAD is present, regardless of whether or not the patient is experiencing any symptoms. The amount of calcification--expressed as the calcium score--may help to predict the likelihood of a myocardial infarction (heart attack) in the coming years and helps your medical doctor or cardiologist decide whether the patient may need to take preventive medicine or undertake other measures such as diet and exercise to lower the risk for heart attack. The extent of CAD is graded according to your calcium score:  Calcium Score  Presence of CAD (coronary artery disease)  0 No evidence of CAD   1-10 Minimal evidence of CAD  11-100 Mild evidence of CAD  101-400 Moderate evidence of CAD  Over 400 Extensive evidence of CAD   Coronary artery calcium (CAC) score is a strong predictor of incident coronary heart disease (CHD) and provides predictive information beyond traditional risk factors. CAC scoring is reasonable to use in the decision to withhold, postpone, or initiate statin therapy in intermediate-risk or selected borderline-risk asymptomatic adults (age 40-75 years and LDL-C >=70 to <190 mg/dL) who do not have diabetes or established atherosclerotic   cardiovascular disease (ASCVD).* In intermediate-risk (10-year ASCVD risk >=7.5% to <20%) adults or selected borderline-risk (10-year ASCVD risk >=5% to <7.5%) adults in whom a CAC score is measured for the purpose of making a treatment decision the following recommendations have  been made:   If CAC=0, it is reasonable to withhold statin therapy and reassess in 5 to 10 years, as long as higher risk conditions are absent (diabetes mellitus, family history of premature CHD in first degree relatives (males <55 years; females <65 years), cigarette smoking, or LDL >=190 mg/dL).   If CAC is 1 to 99, it is reasonable to initiate statin therapy for patients >=55 years of age.   If CAC is >=100 or >=75th percentile, it is reasonable to initiate statin therapy at any age.   Cardiology referral should be considered for patients with CAC scores >=400 or >=75th percentile.   *2018 AHA/ACC/AACVPR/AAPA/ABC/ACPM/ADA/AGS/APhA/ASPC/NLA/PCNA Guideline on the Management of Blood Cholesterol: A Report of the American College of Cardiology/American Heart Association Task Force on Clinical Practice Guidelines. J Am Coll Cardiol. 2019;73(24):3168-3209.  

## 2023-04-07 NOTE — Assessment & Plan Note (Addendum)
Chronic ?Check CBC, B12 ?

## 2023-04-07 NOTE — Assessment & Plan Note (Addendum)
[name] [name] was out of Ozempic x 2 week. Re-started Check labs Coronary calcium CT ordered

## 2023-04-07 NOTE — Assessment & Plan Note (Signed)
Coronary calcium CT ordered 

## 2023-04-07 NOTE — Assessment & Plan Note (Signed)
Last colon - 02/2023, due in 02/2026

## 2023-04-07 NOTE — Progress Notes (Signed)
Subjective:  Patient ID: [name] [name], female    DOB: August 05, 1966  Age: 57 y.o. MRN: [id number]  CC: No chief complaint on file.   HPI [name] [name] presents for DM 2 - pt was out of Ozempic x 2 week, dyslipidemia C/o fatigue   Outpatient Medications Prior to Visit  Medication Sig Dispense Refill   albuterol (VENTOLIN HFA) 108 (90 Base) MCG/ACT inhaler Inhale 2 puffs into the lungs every 4 (four) hours as needed for wheezing or shortness of breath. 8 g 3   ALPRAZolam (XANAX) 0.5 MG tablet TAKE 1 TABLET(0.5 MG) BY MOUTH TWICE DAILY 180 tablet 0   aspirin 81 MG chewable tablet Chew 1 tablet (81 mg total) by mouth daily. 90 tablet 1   atorvastatin (LIPITOR) 10 MG tablet TAKE 1 TABLET BY MOUTH DAILY AT 6 PM 90 tablet 3   buPROPion (WELLBUTRIN SR) 150 MG 12 hr tablet TAKE 1 TABLET BY MOUTH TWICE DAILY 180 tablet 3   Cholecalciferol (EQL VITAMIN D3) 1000 units tablet Take 1 tablet (1,000 Units total) by mouth daily. 90 tablet 1   citalopram (CELEXA) 40 MG tablet TAKE 1 TABLET(40 MG) BY MOUTH DAILY 90 tablet 3   gabapentin (NEURONTIN) 100 MG capsule Take 1 capsule (100 mg total) by mouth 3 (three) times daily. 90 capsule 3   GLYXAMBI 25-5 MG TABS Take 1 tablet by mouth daily. Take 1 tablet by mouth daily. 90 tablet 3   metFORMIN (GLUCOPHAGE) 500 MG tablet Take 2 tablets (1,000 mg total) by mouth 2 (two) times daily with a meal. Overdue for follow-up must see MD for future refills 360 tablet 0   ondansetron (ZOFRAN ODT) 4 MG disintegrating tablet Take 1 tablet (4 mg total) by mouth every 8 (eight) hours as needed for nausea or vomiting. 20 tablet 0   pantoprazole (PROTONIX) 40 MG tablet Take 1 tablet (40 mg total) by mouth 2 (two) times daily. 180 tablet 3   repaglinide (PRANDIN) 2 MG tablet TAKE 1 TABLET BY MOUTH THREE TIMES DAILY BEFORE MEALS 270 tablet 3   Semaglutide, 2 MG/DOSE, (OZEMPIC, 2 MG/DOSE,) 8 MG/3ML SOPN INJECT  INTO THE SKIN ONCE A WEEK 3 mL 0   Ferric Maltol (ACCRUFER) 30  MG CAPS 1 po bid (Patient not taking: Reported on 02/13/2023) 60 capsule 5   No facility-administered medications prior to visit.    ROS: Review of Systems  Constitutional:  Positive for unexpected weight change. Negative for activity change, appetite change, chills and fatigue.  HENT:  Negative for congestion, mouth sores and sinus pressure.   Eyes:  Negative for visual disturbance.  Respiratory:  Negative for cough and chest tightness.   Gastrointestinal:  Negative for abdominal pain and nausea.  Genitourinary:  Negative for difficulty urinating, frequency and vaginal pain.  Musculoskeletal:  Negative for back pain and gait problem.  Skin:  Negative for pallor and rash.  Neurological:  Negative for dizziness, tremors, weakness, numbness and headaches.  Psychiatric/Behavioral:  Negative for confusion and sleep disturbance.     Objective:  BP 130/80 (BP Location: Left Arm, Patient Position: Sitting, Cuff Size: Normal)   Pulse 100   Temp 98.9 F (37.2 C) (Oral)   Ht  (1.727 m)   Wt 196 lb (88.9 kg)   LMP 03/02/2017   SpO2 97%   BMI 29.80 kg/m   BP Readings from Last 3 Encounters:  04/07/23 130/80  03/17/23 (!) 159/77  01/06/23 122/80    Wt Readings from Last 3  Encounters:  04/07/23 196 lb (88.9 kg)  03/17/23 190 lb (86.2 kg)  02/13/23 190 lb (86.2 kg)    Physical Exam Constitutional:      General: She is not in acute distress.    Appearance: She is well-developed. She is obese.  HENT:     Head: Normocephalic.     Right Ear: External ear normal.     Left Ear: External ear normal.     Nose: Nose normal.  Eyes:     General:        Right eye: No discharge.        Left eye: No discharge.     Conjunctiva/sclera: Conjunctivae normal.     Pupils: Pupils are equal, round, and reactive to light.  Neck:     Thyroid: No thyromegaly.     Vascular: No JVD.     Trachea: No tracheal deviation.  Cardiovascular:     Rate and Rhythm: Normal rate and regular rhythm.      Heart sounds: Normal heart sounds.  Pulmonary:     Effort: No respiratory distress.     Breath sounds: No stridor. No wheezing.  Abdominal:     General: Bowel sounds are normal. There is no distension.     Palpations: Abdomen is soft. There is no mass.     Tenderness: There is no abdominal tenderness. There is no guarding or rebound.  Musculoskeletal:        General: No tenderness.     Cervical back: Normal range of motion and neck supple. No rigidity.  Lymphadenopathy:     Cervical: No cervical adenopathy.  Skin:    Findings: No erythema or rash.  Neurological:     Cranial Nerves: No cranial nerve deficit.     Motor: No abnormal muscle tone.     Coordination: Coordination normal.     Deep Tendon Reflexes: Reflexes normal.  Psychiatric:        Behavior: Behavior normal.        Thought Content: Thought content normal.        Judgment: Judgment normal.     Lab Results  Component Value Date   WBC 11.1 (H) 01/03/2023   HGB 13.0 01/03/2023   HCT 41.9 01/03/2023   PLT 229 01/03/2023   GLUCOSE 117 (H) 01/03/2023   CHOL 156 08/11/2019   TRIG (H) 08/11/2019    554.0 Triglyceride is over 400; calculations on Lipids are invalid.   HDL 28.10 (L) 08/11/2019   LDLDIRECT 69.0 08/11/2019   LDLCALC 97 10/23/2017   ALT 23 01/03/2023   AST 24 01/03/2023   NA 137 01/03/2023   K 4.1 01/03/2023   CL 105 01/03/2023   CREATININE 0.60 01/03/2023   BUN 11 01/03/2023   CO2 21 (L) 01/03/2023   TSH 2.263 01/03/2023   HGBA1C 7.0 (H) 08/15/2022   MICROALBUR 2.9 (H) 07/31/2020    DG Chest 2 View  Result Date: 01/03/2023 CLINICAL DATA:  chest pain EXAM: CHEST - 2 VIEW COMPARISON:  January 12, 2009 FINDINGS: The cardiomediastinal silhouette is unchanged in contour. No pleural effusion. No pneumothorax. No acute pleuroparenchymal abnormality. Visualized abdomen is unremarkable. Multilevel degenerative changes of the thoracic spine. IMPRESSION: No acute cardiopulmonary abnormality. Electronically  Signed   By: Meda Klinefelter M.D.   On: 01/03/2023 18:15    Assessment & Plan:   Problem List Items Addressed This Visit       Digestive   Colon polyps    Last colon - 02/2023, due in  02/2026      Relevant Orders   CBC with Differential/Platelet     Endocrine   Diabetes mellitus type 2 in obese - Primary    [name] [name] was out of Ozempic x 2 week. Re-started Check labs Coronary calcium CT ordered      Relevant Orders   Comprehensive metabolic panel   Hemoglobin A1c   Vitamin B12   CBC with Differential/Platelet   CT CARDIAC SCORING (SELF PAY ONLY)     Other   Dyslipidemia    Coronary calcium CT ordered      Fatigue    Chronic Check CBC, B12         No orders of the defined types were placed in this encounter.     Follow-up: No follow-ups on file.  Sonda Primes, MD

## 2023-04-08 LAB — CBC WITH DIFFERENTIAL/PLATELET
Basophils Absolute: 0.1 10*3/uL (ref 0.0–0.1)
Basophils Relative: 1.1 % (ref 0.0–3.0)
Eosinophils Absolute: 0.2 10*3/uL (ref 0.0–0.7)
Eosinophils Relative: 2.6 % (ref 0.0–5.0)
HCT: 36.9 % (ref 36.0–46.0)
Hemoglobin: 11.9 g/dL — ABNORMAL LOW (ref 12.0–15.0)
Lymphocytes Relative: 27.2 % (ref 12.0–46.0)
Lymphs Abs: 2.3 10*3/uL (ref 0.7–4.0)
MCHC: 32.3 g/dL (ref 30.0–36.0)
MCV: 79 fl (ref 78.0–100.0)
Monocytes Absolute: 0.4 10*3/uL (ref 0.1–1.0)
Monocytes Relative: 5.1 % (ref 3.0–12.0)
Neutro Abs: 5.4 10*3/uL (ref 1.4–7.7)
Neutrophils Relative %: 64 % (ref 43.0–77.0)
Platelets: 225 10*3/uL (ref 150.0–400.0)
RBC: 4.67 Mil/uL (ref 3.87–5.11)
RDW: 15.6 % — ABNORMAL HIGH (ref 11.5–15.5)
WBC: 8.5 10*3/uL (ref 4.0–10.5)

## 2023-04-08 LAB — VITAMIN B12: Vitamin B-12: 621 pg/mL (ref 211–911)

## 2023-04-08 LAB — COMPREHENSIVE METABOLIC PANEL
ALT: 17 U/L (ref 0–35)
AST: 18 U/L (ref 0–37)
Albumin: 4.3 g/dL (ref 3.5–5.2)
Alkaline Phosphatase: 83 U/L (ref 39–117)
BUN: 9 mg/dL (ref 6–23)
CO2: 30 mEq/L (ref 19–32)
Calcium: 9.2 mg/dL (ref 8.4–10.5)
Chloride: 100 mEq/L (ref 96–112)
Creatinine, Ser: 0.59 mg/dL (ref 0.40–1.20)
GFR: 102.26 mL/min (ref >60.00)
Glucose, Bld: 122 mg/dL — ABNORMAL HIGH (ref 70–99)
Potassium: 4 mEq/L (ref 3.5–5.1)
Sodium: 137 mEq/L (ref 135–145)
Total Bilirubin: 0.5 mg/dL (ref 0.2–1.2)
Total Protein: 7.1 g/dL (ref 6.0–8.3)

## 2023-04-08 LAB — HEMOGLOBIN A1C: Hgb A1c MFr Bld: 7.1 % — ABNORMAL HIGH (ref 4.6–6.5)

## 2023-05-08 ENCOUNTER — Ambulatory Visit (INDEPENDENT_AMBULATORY_CARE_PROVIDER_SITE_OTHER): Payer: BC Managed Care – PPO | Admitting: Internal Medicine

## 2023-05-08 ENCOUNTER — Encounter: Payer: Self-pay | Admitting: Internal Medicine

## 2023-05-08 VITALS — BP 120/70 | HR 111 | Temp 98.2°F | Ht 68.0 in | Wt 198.0 lb

## 2023-05-08 DIAGNOSIS — E669 Obesity, unspecified: Secondary | ICD-10-CM | POA: Diagnosis not present

## 2023-05-08 DIAGNOSIS — Z7984 Long term (current) use of oral hypoglycemic drugs: Secondary | ICD-10-CM

## 2023-05-08 DIAGNOSIS — E1169 Type 2 diabetes mellitus with other specified complication: Secondary | ICD-10-CM

## 2023-05-08 DIAGNOSIS — L732 Hidradenitis suppurativa: Secondary | ICD-10-CM | POA: Diagnosis not present

## 2023-05-08 DIAGNOSIS — Z7985 Long-term (current) use of injectable non-insulin antidiabetic drugs: Secondary | ICD-10-CM

## 2023-05-08 MED ORDER — DOXYCYCLINE HYCLATE 100 MG PO TABS: 100.0000 mg | ORAL_TABLET | Freq: Two times a day (BID) | ORAL | 2 refills | Status: DC

## 2023-05-08 MED ORDER — MUPIROCIN 2 % EX OINT: TOPICAL_OINTMENT | CUTANEOUS | 1 refills | Status: DC

## 2023-05-08 NOTE — Progress Notes (Unsigned)
Subjective:  Patient ID: [name] [name], female    DOB: [birth year]  Age: 57 y.o. MRN: [id number]  CC: Rash   HPI [name] [name] presents for boils in the R axilla - 8 weeks - worse  Outpatient Medications Prior to Visit  Medication Sig Dispense Refill   albuterol (VENTOLIN HFA) 108 (90 Base) MCG/ACT inhaler Inhale 2 puffs into the lungs every 4 (four) hours as needed for wheezing or shortness of breath. 8 g 3   ALPRAZolam (XANAX) 0.5 MG tablet TAKE 1 TABLET(0.5 MG) BY MOUTH TWICE DAILY 180 tablet 0   aspirin 81 MG chewable tablet Chew 1 tablet (81 mg total) by mouth daily. 90 tablet 1   atorvastatin (LIPITOR) 10 MG tablet TAKE 1 TABLET BY MOUTH DAILY AT 6 PM 90 tablet 3   buPROPion (WELLBUTRIN SR) 150 MG 12 hr tablet TAKE 1 TABLET BY MOUTH TWICE DAILY 180 tablet 3   Cholecalciferol (EQL VITAMIN D3) 1000 units tablet Take 1 tablet (1,000 Units total) by mouth daily. 90 tablet 1   citalopram (CELEXA) 40 MG tablet TAKE 1 TABLET(40 MG) BY MOUTH DAILY 90 tablet 3   Ferric Maltol (ACCRUFER) 30 MG CAPS 1 po bid 60 capsule 5   gabapentin (NEURONTIN) 100 MG capsule Take 1 capsule (100 mg total) by mouth 3 (three) times daily. 90 capsule 3   GLYXAMBI 25-5 MG TABS Take 1 tablet by mouth daily. Take 1 tablet by mouth daily. 90 tablet 3   metFORMIN (GLUCOPHAGE) 500 MG tablet Take 2 tablets (1,000 mg total) by mouth 2 (two) times daily with a meal. Overdue for follow-up must see MD for future refills 360 tablet 0   ondansetron (ZOFRAN ODT) 4 MG disintegrating tablet Take 1 tablet (4 mg total) by mouth every 8 (eight) hours as needed for nausea or vomiting. 20 tablet 0   pantoprazole (PROTONIX) 40 MG tablet Take 1 tablet (40 mg total) by mouth 2 (two) times daily. 180 tablet 3   repaglinide (PRANDIN) 2 MG tablet TAKE 1 TABLET BY MOUTH THREE TIMES DAILY BEFORE MEALS 270 tablet 3   Semaglutide, 2 MG/DOSE, (OZEMPIC, 2 MG/DOSE,) 8 MG/3ML SOPN INJECT 2MG  INTO THE SKIN ONCE A WEEK 3 mL 0   No  facility-administered medications prior to visit.    ROS: Review of Systems  Constitutional:  Negative for activity change, appetite change, chills and unexpected weight change.  HENT:  Negative for congestion, mouth sores and sinus pressure.   Eyes:  Negative for visual disturbance.  Respiratory:  Negative for cough and chest tightness.   Gastrointestinal:  Negative for abdominal pain and nausea.  Genitourinary:  Negative for difficulty urinating, frequency and vaginal pain.  Musculoskeletal:  Negative for back pain and gait problem.  Skin:  Negative for pallor and rash.  Neurological:  Negative for dizziness, tremors, weakness, numbness and headaches.  Psychiatric/Behavioral:  Negative for confusion and sleep disturbance.     Objective:  BP 120/70 (BP Location: Right Arm, Patient Position: Sitting, Cuff Size: Normal)   Pulse (!) 111   Temp 98.2 F (36.8 C) (Oral)   Ht 5\' 8"  (1.727 m)   Wt 198 lb (89.8 kg)   LMP 03/02/2017   SpO2 97%   BMI 30.11 kg/m   BP Readings from Last 3 Encounters:  05/08/23 120/70  04/07/23 130/80  03/17/23 (!) 159/77    Wt Readings from Last 3 Encounters:  05/08/23 198 lb (89.8 kg)  04/07/23 196 lb (88.9 kg)  03/17/23 190 lb (  86.2 kg)    Physical Exam  Lab Results  Component Value Date   WBC 8.5 04/07/2023   HGB 11.9 (L) 04/07/2023   HCT 36.9 04/07/2023   PLT 225.0 04/07/2023   GLUCOSE 122 (H) 04/07/2023   CHOL 156 08/11/2019   TRIG (H) 08/11/2019    554.0 Triglyceride is over 400; calculations on Lipids are invalid.   HDL 28.10 (L) 08/11/2019   LDLDIRECT 69.0 08/11/2019   LDLCALC 97 10/23/2017   ALT 17 04/07/2023   AST 18 04/07/2023   NA 137 04/07/2023   K 4.0 04/07/2023   CL 100 04/07/2023   CREATININE 0.59 04/07/2023   BUN 9 04/07/2023   CO2 30 04/07/2023   TSH 2.263 01/03/2023   HGBA1C 7.1 (H) 04/07/2023   MICROALBUR 2.9 (H) 07/31/2020    DG Chest 2 View  Result Date: 01/03/2023 CLINICAL DATA:  chest pain EXAM: CHEST  - 2 VIEW COMPARISON:  January 12, 2009 FINDINGS: The cardiomediastinal silhouette is unchanged in contour. No pleural effusion. No pneumothorax. No acute pleuroparenchymal abnormality. Visualized abdomen is unremarkable. Multilevel degenerative changes of the thoracic spine. IMPRESSION: No acute cardiopulmonary abnormality. Electronically Signed   By: Meda Klinefelter M.D.   On: 01/03/2023 18:15    Assessment & Plan:   Problem List Items Addressed This Visit     Hydradenitis - Primary    Doxy Mupr         Meds ordered this encounter  Medications   doxycycline (VIBRA-TABS) 100 MG tablet    Sig: Take 1 tablet (100 mg total) by mouth 2 (two) times daily.    Dispense:  60 tablet    Refill:  2   mupirocin ointment (BACTROBAN) 2 %    Sig: On leg wound w/dressing change qd or bid    Dispense:  60 g    Refill:  1      Follow-up: Return for a follow-up visit.  Sonda Primes, MD

## 2023-05-08 NOTE — Assessment & Plan Note (Signed)
Doxy Mupr

## 2023-05-13 ENCOUNTER — Encounter: Payer: Self-pay | Admitting: Internal Medicine

## 2023-05-13 NOTE — Assessment & Plan Note (Signed)
[name] [name] was out of Ozempic x 2 week. Re-started Check labs Coronary calcium CT ordered 

## 2023-05-20 ENCOUNTER — Other Ambulatory Visit: Payer: Self-pay | Admitting: Internal Medicine

## 2023-06-04 ENCOUNTER — Other Ambulatory Visit: Payer: Self-pay | Admitting: Internal Medicine

## 2023-07-02 LAB — HM DIABETES EYE EXAM

## 2023-08-05 ENCOUNTER — Encounter: Payer: Self-pay | Admitting: Internal Medicine

## 2023-08-07 ENCOUNTER — Other Ambulatory Visit: Payer: Self-pay | Admitting: Internal Medicine

## 2023-08-07 DIAGNOSIS — D509 Iron deficiency anemia, unspecified: Secondary | ICD-10-CM

## 2023-08-17 ENCOUNTER — Other Ambulatory Visit: Payer: Self-pay

## 2023-08-17 DIAGNOSIS — D509 Iron deficiency anemia, unspecified: Secondary | ICD-10-CM

## 2023-08-20 ENCOUNTER — Inpatient Hospital Stay: Payer: BC Managed Care – PPO | Attending: Physician Assistant

## 2023-08-20 DIAGNOSIS — Z800 Family history of malignant neoplasm of digestive organs: Secondary | ICD-10-CM | POA: Diagnosis not present

## 2023-08-20 DIAGNOSIS — Z87891 Personal history of nicotine dependence: Secondary | ICD-10-CM | POA: Diagnosis not present

## 2023-08-20 DIAGNOSIS — D509 Iron deficiency anemia, unspecified: Secondary | ICD-10-CM | POA: Insufficient documentation

## 2023-08-20 LAB — CMP (CANCER CENTER ONLY)
ALT: 23 U/L (ref 0–44)
AST: 23 U/L (ref 15–41)
Albumin: 4.2 g/dL (ref 3.5–5.0)
Alkaline Phosphatase: 87 U/L (ref 38–126)
Anion gap: 7 (ref 5–15)
BUN: 13 mg/dL (ref 6–20)
CO2: 26 mmol/L (ref 22–32)
Calcium: 8.9 mg/dL (ref 8.9–10.3)
Chloride: 102 mmol/L (ref 98–111)
Creatinine: 0.67 mg/dL (ref 0.44–1.00)
GFR, Estimated: >60 mL/min (ref >60)
Glucose, Bld: 199 mg/dL — ABNORMAL HIGH (ref 70–99)
Potassium: 3.8 mmol/L (ref 3.5–5.1)
Sodium: 135 mmol/L (ref 135–145)
Total Bilirubin: 0.6 mg/dL (ref 0.3–1.2)
Total Protein: 6.9 g/dL (ref 6.5–8.1)

## 2023-08-20 LAB — CBC WITH DIFFERENTIAL (CANCER CENTER ONLY)
Abs Immature Granulocytes: 0.04 10*3/uL (ref 0.00–0.07)
Basophils Absolute: 0.1 10*3/uL (ref 0.0–0.1)
Basophils Relative: 1 %
Eosinophils Absolute: 0.2 10*3/uL (ref 0.0–0.5)
Eosinophils Relative: 2 %
HCT: 35.1 % — ABNORMAL LOW (ref 36.0–46.0)
Hemoglobin: 10.8 g/dL — ABNORMAL LOW (ref 12.0–15.0)
Immature Granulocytes: 0 %
Lymphocytes Relative: 28 %
Lymphs Abs: 2.7 10*3/uL (ref 0.7–4.0)
MCH: 24.5 pg — ABNORMAL LOW (ref 26.0–34.0)
MCHC: 30.8 g/dL (ref 30.0–36.0)
MCV: 79.6 fL — ABNORMAL LOW (ref 80.0–100.0)
Monocytes Absolute: 0.5 10*3/uL (ref 0.1–1.0)
Monocytes Relative: 5 %
Neutro Abs: 6.1 10*3/uL (ref 1.7–7.7)
Neutrophils Relative %: 64 %
Platelet Count: 223 10*3/uL (ref 150–400)
RBC: 4.41 MIL/uL (ref 3.87–5.11)
RDW: 15.2 % (ref 11.5–15.5)
WBC Count: 9.6 10*3/uL (ref 4.0–10.5)
nRBC: 0 % (ref 0.0–0.2)

## 2023-08-20 LAB — IRON AND IRON BINDING CAPACITY (CC-WL,HP ONLY)
Iron: 35 ug/dL (ref 28–170)
Saturation Ratios: 8 % — ABNORMAL LOW (ref 10.4–31.8)
TIBC: 435 ug/dL (ref 250–450)
UIBC: 400 ug/dL

## 2023-08-21 ENCOUNTER — Inpatient Hospital Stay: Payer: BC Managed Care – PPO | Admitting: Physician Assistant

## 2023-08-21 VITALS — BP 180/118 | HR 110 | Temp 98.2°F | Resp 17 | Ht 68.0 in | Wt 199.0 lb

## 2023-08-21 DIAGNOSIS — D509 Iron deficiency anemia, unspecified: Secondary | ICD-10-CM | POA: Diagnosis not present

## 2023-08-21 LAB — FERRITIN: Ferritin: 7 ng/mL — ABNORMAL LOW (ref 11–307)

## 2023-08-21 NOTE — Progress Notes (Signed)
[facility] [phone]) [phone]   [phone]) [phone]  PROGRESS NOTE  Patient Care Team: [name], [name], MD as PCP - General Huel Cote, MD (Obstetrics and Gynecology) Lewayne Bunting, MD (Cardiology) Benjiman Core, MD (Inactive) as Consulting Physician (Oncology)  CHIEF COMPLAINTS/PURPOSE OF CONSULTATION:  Iron deficiency anemia  HISTORY OF PRESENTING ILLNESS:  [name] 57 y.o. female returns for follow-up for iron deficiency anemia.  She was last seen by Dr. Clelia Croft on 01/24/2021.  She presents today to transfer care to Dr. Leonides Schanz and team.   On exam today, Ms. Murad reports having progressive fatigue that requires frequent rest.  She adds that she is dizzy without any syncopal episodes.  She is having more frequent headaches.  She craves ice regularly.  She reports she has tried various iron pills but her iron levels have not improved so she has discontinued oral medication.  She denies nausea, vomiting or bowel habit changes.  She has no signs of bleeding including menstrual bleeding, hematochezia or melena.  She denies fevers, chills, sweats, chest pain or cough.  She has no other complaints.  Rest of the 10 point ROS as below.  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   Hydradenitis    right   Neuromuscular disorder (HCC)     SURGICAL HISTORY: Past Surgical History:  Procedure Laterality Date   CESAREAN SECTION     x 2   CHOLECYSTECTOMY  2006    SOCIAL HISTORY: Social History   Socioeconomic History   Marital status: Married    Spouse name: Ronnie   Number of children: 2   Years of education: Not on file   Highest education level: Bachelor's degree (e.g., BA, AB, BS)   Occupational History   Occupation: [employer] features editor: [employer]  Tobacco Use   Smoking status: Former    Current packs/day: 0.50    Average packs/day: 0.5 packs/day for 15.0 years (7.5 ttl pk-yrs)    Types: Cigarettes   Smokeless tobacco: Never   Tobacco comments:    info given 12/14/2019  Vaping Use   Vaping status: Never Used  Substance and Sexual Activity   Alcohol use: No   Drug use: No   Sexual activity: Yes  Other Topics Concern   Not on file  Social History Narrative   Not on file   Social Determinants of Health   Financial Resource Strain: Low Risk  (04/07/2023)   Overall Financial Resource Strain (CARDIA)    Difficulty of Paying Living Expenses: Not very hard  Food Insecurity: No Food Insecurity (04/07/2023)   Hunger Vital Sign    Worried About Running Out of Food in the Last Year: Never true    Ran Out of Food in the Last Year: Never true  Transportation Needs: No Transportation Needs (04/07/2023)   PRAPARE - Administrator, Civil Service (Medical): No    Lack of Transportation (Non-Medical): No  Physical Activity: Insufficiently Active (04/07/2023)   Exercise Vital Sign    Days of Exercise per Week: 1 day    Minutes of Exercise per Session: 10 min  Stress: Stress Concern Present (  04/07/2023)   [place] [employer] - Occupational Stress Questionnaire    Feeling of Stress : To some extent  Social Connections: Moderately Integrated (04/07/2023)   Social Connection and Isolation Panel [NHANES]    Frequency of Communication with Friends and Family: More than three times a week    Frequency of Social Gatherings with Friends and Family: Never    Attends Religious Services: 1 to 4 times per year    Active Member of [employer] or Organizations: No    Attends Engineer, structural: Not on file    Marital Status: Married  Catering manager Violence: Not on file    FAMILY HISTORY: Family History  Problem Relation Age of  Onset   Diabetes Mother    Hypertension Mother    Inflammatory bowel disease Maternal Aunt    Colon cancer Paternal Aunt    Colon polyps Maternal Grandmother    Hypertension Other    Liver cancer Neg Hx    Pancreatic cancer Neg Hx    Rectal cancer Neg Hx    Stomach cancer Neg Hx    Esophageal cancer Neg Hx     ALLERGIES:  has No Known Allergies.  MEDICATIONS:  Current Outpatient Medications  Medication Sig Dispense Refill   albuterol (VENTOLIN HFA) 108 (90 Base) MCG/ACT inhaler Inhale 2 puffs into the lungs every 4 (four) hours as needed for wheezing or shortness of breath. 8 g 3   ALPRAZolam (XANAX) 0.5 MG tablet TAKE 1 TABLET(0.5 MG) BY MOUTH TWICE DAILY 180 tablet 0   aspirin 81 MG chewable tablet Chew 1 tablet (81 mg total) by mouth daily. 90 tablet 1   atorvastatin (LIPITOR) 10 MG tablet TAKE 1 TABLET BY MOUTH DAILY AT 6 PM 90 tablet 3   buPROPion (WELLBUTRIN SR) 150 MG 12 hr tablet TAKE 1 TABLET BY MOUTH TWICE DAILY 180 tablet 3   Cholecalciferol (EQL VITAMIN D3) 1000 units tablet Take 1 tablet (1,000 Units total) by mouth daily. 90 tablet 1   citalopram (CELEXA) 40 MG tablet TAKE 1 TABLET(40 MG) BY MOUTH DAILY 90 tablet 3   doxycycline (VIBRA-TABS) 100 MG tablet Take 1 tablet (100 mg total) by mouth 2 (two) times daily. 60 tablet 2   Ferric Maltol (ACCRUFER) 30 MG CAPS 1 po bid 60 capsule 5   gabapentin (NEURONTIN) 100 MG capsule TAKE 1 CAPSULE(100 MG) BY MOUTH THREE TIMES DAILY 90 capsule 3   GLYXAMBI 25-5 MG TABS Take 1 tablet by mouth daily. Take 1 tablet by mouth daily. 90 tablet 3   metFORMIN (GLUCOPHAGE) 500 MG tablet Take 2 tablets (1,000 mg total) by mouth 2 (two) times daily with a meal. Annual appt due in August must see provider for future refills 360 tablet 0   mupirocin ointment (BACTROBAN) 2 % On leg wound w/dressing change qd or bid 60 g 1   ondansetron (ZOFRAN ODT) 4 MG disintegrating tablet Take 1 tablet (4 mg total) by mouth every 8 (eight) hours as needed for  nausea or vomiting. 20 tablet 0   pantoprazole (PROTONIX) 40 MG tablet Take 1 tablet (40 mg total) by mouth 2 (two) times daily. 180 tablet 3   repaglinide (PRANDIN) 2 MG tablet TAKE 1 TABLET BY MOUTH THREE TIMES DAILY BEFORE MEALS 270 tablet 3   Semaglutide, 2 MG/DOSE, (OZEMPIC, 2 MG/DOSE,) 8 MG/3ML SOPN INJECT 2MG  INTO THE SKIN ONCE A WEEK 3 mL 0   No current facility-administered medications for this visit.    REVIEW OF  SYSTEMS:   Constitutional: ( - ) fevers, ( - )  chills , ( - ) night sweats Eyes: ( - ) blurriness of vision, ( - ) double vision, ( - ) watery eyes Ears, nose, mouth, throat, and face: ( - ) mucositis, ( - ) sore throat Respiratory: ( - ) cough, ( + ) dyspnea, ( - ) wheezes Cardiovascular: ( - ) palpitation, ( - ) chest discomfort, ( - ) lower extremity swelling Gastrointestinal:  ( - ) nausea, ( - ) heartburn, ( - ) change in bowel habits Skin: ( - ) abnormal skin rashes Lymphatics: ( - ) new lymphadenopathy, ( - ) easy bruising Neurological: ( - ) numbness, ( - ) tingling, ( - ) new weaknesses Behavioral/Psych: ( - ) mood change, ( - ) new changes  All other systems were reviewed with the patient and are negative.  PHYSICAL EXAMINATION: ECOG PERFORMANCE STATUS: 1 - Symptomatic but completely ambulatory  Vitals:   08/21/23 1511 08/21/23 1512  BP: (!) 158/90 (!) 180/118  Pulse: (!) 111 (!) 110  Resp: 17   Temp: 98.2 F (36.8 C)   SpO2: 99%    Filed Weights   08/21/23 1511  Weight: 199 lb (90.3 kg)    GENERAL: well appearing female in NAD  SKIN: skin color, texture, turgor are normal, no rashes or significant lesions EYES: conjunctiva are pink and non-injected, sclera clear  LUNGS: clear to auscultation and percussion with normal breathing effort HEART: regular rate & rhythm and no murmurs and no lower extremity edema Musculoskeletal: no cyanosis of digits and no clubbing  PSYCH: alert & oriented x 3, fluent speech NEURO: no focal motor/sensory  deficits  LABORATORY DATA:  I have reviewed the data as listed    Latest Ref Rng & Units 08/20/2023    3:28 PM 04/07/2023    4:28 PM 01/03/2023    6:02 PM  CBC  WBC 4.0 - 10.5 K/uL 9.6  8.5  11.1   Hemoglobin 12.0 - 15.0 g/dL 74.2  59.5  63.8   Hematocrit 36.0 - 46.0 % 35.1  36.9  41.9   Platelets 150 - 400 K/uL 223  225.0  229        Latest Ref Rng & Units 08/20/2023    3:28 PM 04/07/2023    4:28 PM 01/03/2023    6:02 PM  CMP  Glucose 70 - 99 mg/dL 756  433  295   BUN 6 - 20 mg/dL 13  9  11    Creatinine 0.44 - 1.00 mg/dL 1.88  4.16  6.06   Sodium 135 - 145 mmol/L 135  137  137   Potassium 3.5 - 5.1 mmol/L 3.8  4.0  4.1   Chloride 98 - 111 mmol/L 102  100  105   CO2 22 - 32 mmol/L 26  30  21    Calcium 8.9 - 10.3 mg/dL 8.9  9.2  9.4   Total Protein 6.5 - 8.1 g/dL 6.9  7.1  7.0   Total Bilirubin 0.3 - 1.2 mg/dL 0.6  0.5  0.7   Alkaline Phos 38 - 126 U/L 87  83  74   AST 15 - 41 U/L 23  18  24    ALT 0 - 44 U/L 23  17  23      ASSESSMENT & PLAN JAMEISHA CANION is a 57 y.o. female who presents for follow-up for iron deficiency anemia.  #Iron deficiency anemia: -- Etiology unknown as patient has no  signs of bleeding including menstrual bleeding, hematochezia or melena. -- She has undergone endoscopic evaluation with EGD on 09/25/2020 and most recently a colonoscopy on 03/17/2023.  No evidence of bleeding was identified for either of those assessments.  We will reach out to GI to determine if a capsule endoscopy is warranted. -- Patient reports that her iron levels did not improve with p.o. iron therapy. --Labs from yesterday show iron deficiency anemia with a hemoglobin 10.8, MCV was 79.6.  Iron panel showed serum iron 35, saturation 8%, ferritin 7. -- Recommend IV iron to boost her iron levels. -- Return to clinic in 8 weeks with labs and follow-up after IV iron infusions.  No orders of the defined types were placed in this encounter.   All questions were answered. The patient  knows to call the clinic with any problems, questions or concerns.  I have spent a total of 30 minutes minutes of face-to-face and non-face-to-face time, preparing to see the patient,, performing a medically appropriate examination, counseling and educating the patient, ordering medications/tests/procedures, referring and communicating with other health care professionals, documenting clinical information in the electronic health record, independently interpreting results and communicating results to the patient, and care coordination.   Georga Kaufmann, PA-C Department of Hematology/Oncology [facility] Phone: [phone]

## 2023-08-25 ENCOUNTER — Telehealth: Payer: Self-pay | Admitting: Pharmacy Technician

## 2023-08-25 ENCOUNTER — Telehealth: Payer: Self-pay

## 2023-08-25 NOTE — Telephone Encounter (Signed)
Pt scheduled to see Dr. Marina Goodell 10/20/23 at 11:40am. Appt letter mailed to pt.

## 2023-08-25 NOTE — Telephone Encounter (Signed)
-----   Message from Yancey Flemings sent at 08/21/2023  5:26 PM EDT ----- Bonita Quin, Set up routine OV for IDA. Thanks, JP ----- Message ----- From: Raymondo Band Sent: 08/21/2023   3:53 PM EDT To: Hilarie Fredrickson, MD  Dr.Perry,   I aw Mr. Trettel for a follow up for IDA. Her anemia has worsened and we will be arranging for IV iron. I wanted to reach out and request your input on further GI evaluation (EGD/capsular endoscopy) to determine underlying cause. She is denying any overt signs of bleeding.   Appreciate your assistance.   Thanks, Karena Addison

## 2023-08-25 NOTE — Telephone Encounter (Signed)
Auth Submission: NO AUTH NEEDED Site of care: Site of care: [facility] Payer: BCBS [place] Medication & CPT/J Code(s) submitted: Feraheme (ferumoxytol) [id number] Route of submission (phone, fax, portal):  Phone # Fax # Auth type: Buy/Bill PB Units/visits requested: X2 Reference number:  Approval from: 08/25/23 to 12/22/23

## 2023-08-27 ENCOUNTER — Ambulatory Visit (INDEPENDENT_AMBULATORY_CARE_PROVIDER_SITE_OTHER): Payer: BC Managed Care – PPO

## 2023-08-27 VITALS — BP 171/76 | HR 100 | Temp 98.3°F | Resp 12 | Ht 68.0 in | Wt 197.8 lb

## 2023-08-27 DIAGNOSIS — D509 Iron deficiency anemia, unspecified: Secondary | ICD-10-CM | POA: Diagnosis not present

## 2023-08-27 MED ORDER — DIPHENHYDRAMINE HCL 25 MG PO CAPS: 25.0000 mg | ORAL_CAPSULE | Freq: Once | ORAL | Status: DC

## 2023-08-27 MED ORDER — ACETAMINOPHEN 325 MG PO TABS: 650.0000 mg | ORAL_TABLET | Freq: Once | ORAL | Status: DC

## 2023-08-27 MED ORDER — SODIUM CHLORIDE 0.9 % IV SOLN
510.0000 mg | Freq: Once | INTRAVENOUS | Status: AC
  Administered 2023-08-27: 510 mg via INTRAVENOUS
  Filled 2023-08-27: qty 17

## 2023-08-27 NOTE — Progress Notes (Signed)
Diagnosis: Iron Deficiency Anemia  Provider:  Chilton Greathouse MD  Procedure: IV Infusion  IV Type: Peripheral, IV Location: L Antecubital  Feraheme (Ferumoxytol), Dose: 510 mg  Infusion Start Time: 1501  Infusion Stop Time: 1517  Post Infusion IV Care: Patient declined observation and Peripheral IV Discontinued  Discharge: Condition: Good, Destination: Home . AVS Declined  Performed by:  Wyvonne Lenz, RN

## 2023-08-28 ENCOUNTER — Telehealth: Payer: Self-pay | Admitting: Physician Assistant

## 2023-08-28 NOTE — Telephone Encounter (Signed)
Left patient a message regarding upcoming appointment times/dates; left callback number if needed for rescheduling

## 2023-09-03 ENCOUNTER — Ambulatory Visit (INDEPENDENT_AMBULATORY_CARE_PROVIDER_SITE_OTHER): Payer: BC Managed Care – PPO

## 2023-09-03 VITALS — BP 168/82 | HR 91 | Temp 97.9°F | Resp 16 | Ht 68.0 in | Wt 199.2 lb

## 2023-09-03 DIAGNOSIS — D509 Iron deficiency anemia, unspecified: Secondary | ICD-10-CM

## 2023-09-03 MED ORDER — ACETAMINOPHEN 325 MG PO TABS
650.0000 mg | ORAL_TABLET | Freq: Once | ORAL | Status: DC
  Filled 2023-09-03: qty 2

## 2023-09-03 MED ORDER — SODIUM CHLORIDE 0.9 % IV SOLN
510.0000 mg | Freq: Once | INTRAVENOUS | Status: AC
  Administered 2023-09-03: 510 mg via INTRAVENOUS
  Filled 2023-09-03: qty 17

## 2023-09-03 MED ORDER — DIPHENHYDRAMINE HCL 25 MG PO CAPS
25.0000 mg | ORAL_CAPSULE | Freq: Once | ORAL | Status: DC
  Filled 2023-09-03: qty 1

## 2023-09-03 NOTE — Progress Notes (Signed)
Diagnosis: Iron Deficiency Anemia  Provider:  Chilton Greathouse MD  Procedure: IV Infusion  IV Type: Peripheral, IV Location: L Antecubital  Feraheme (Ferumoxytol), Dose: 510 mg  Infusion Start Time: 1452  Infusion Stop Time: 1511  Post Infusion IV Care: Patient declined observation and Peripheral IV Discontinued  Discharge: Condition: Good, Destination: Home . AVS Declined  Performed by:  Rico Ala, LPN

## 2023-09-11 ENCOUNTER — Other Ambulatory Visit: Payer: Self-pay | Admitting: Internal Medicine

## 2023-09-14 ENCOUNTER — Encounter: Payer: Self-pay | Admitting: Physician Assistant

## 2023-09-14 ENCOUNTER — Other Ambulatory Visit: Payer: Self-pay | Admitting: Internal Medicine

## 2023-09-14 ENCOUNTER — Other Ambulatory Visit (HOSPITAL_COMMUNITY): Payer: Self-pay

## 2023-09-14 ENCOUNTER — Encounter: Payer: Self-pay | Admitting: Oncology

## 2023-09-14 DIAGNOSIS — R5383 Other fatigue: Secondary | ICD-10-CM

## 2023-09-14 DIAGNOSIS — E1142 Type 2 diabetes mellitus with diabetic polyneuropathy: Secondary | ICD-10-CM

## 2023-09-17 ENCOUNTER — Other Ambulatory Visit (HOSPITAL_COMMUNITY): Payer: Self-pay

## 2023-09-18 ENCOUNTER — Encounter: Payer: Self-pay | Admitting: Physician Assistant

## 2023-09-18 ENCOUNTER — Telehealth: Payer: Self-pay

## 2023-09-18 ENCOUNTER — Encounter: Payer: Self-pay | Admitting: Oncology

## 2023-09-18 ENCOUNTER — Other Ambulatory Visit (HOSPITAL_COMMUNITY): Payer: Self-pay

## 2023-09-18 NOTE — Telephone Encounter (Signed)
Pharmacy Patient Advocate Encounter   Received notification from CoverMyMeds that prior authorization for Ozempic (2 MG/DOSE) 8MG /3ML pen-injectors is required/requested.   Insurance verification completed.   The patient is insured through [facility] .   Per test claim: PA required; PA submitted to [facility] via Fax Key/confirmation #/EOC --- Status is pending

## 2023-09-22 ENCOUNTER — Other Ambulatory Visit (HOSPITAL_COMMUNITY): Payer: Self-pay

## 2023-09-22 NOTE — Telephone Encounter (Signed)
I have called the number on the back of her insurance card to start a prior authorization and was transferred multiple times and was not able to obtain a prior authorization.

## 2023-10-19 NOTE — Progress Notes (Unsigned)
[name] OFFICE PROGRESS NOTE  [name], [name], MD  ASSESSMENT & PLAN:  Iron deficiency anemia Iron deficiency anemia: -- Etiology unknown as patient has no signs of bleeding including menstrual bleeding, hematochezia or melena. -- She has undergone endoscopic evaluation with EGD on 09/25/2020 and most recently a colonoscopy on 03/17/2023.  No evidence of bleeding was identified for either of those assessments.  We will reach out to GI to determine if a capsule endoscopy is warranted. -- Patient reports that her iron levels did not improve with p.o. iron therapy and thus, progressed to IV iron treatments. --Labs from today -- Recommend IV iron to boost her iron levels. --   Plan:   No orders of the defined types were placed in this encounter.   The total time spent in the appointment was {CHL ONC TIME VISIT - HYQMV:[id number]} encounter with patients including review of chart and various tests results, discussions about plan of care and coordination of care plan   All questions were answered. The patient knows to call the clinic with any problems, questions or concerns. No barriers to learning was detected.    Carlean Jews, NP 10/28/20245:14 PM  INTERVAL HISTORY: [name] [name] 57 y.o. female returns for follow up of iron deficiency anemia  SUMMARY OF HEMATOLOGIC HISTORY: Iron deficiency anemia: -- Etiology unknown as patient has no signs of bleeding including menstrual bleeding, hematochezia or melena. -- She has undergone endoscopic evaluation with EGD on 09/25/2020 and most recently a colonoscopy on 03/17/2023.  No evidence of bleeding was identified for either of those assessments.  We will reach out to GI to determine if a capsule endoscopy is warranted. -- Patient reports that her iron levels did not improve with p.o. iron therapy. --Most recent labs showed iron deficiency anemia with a hemoglobin 10.8, MCV was 79.6.  Iron panel showed serum iron  35, saturation 8%, ferritin 7. --IV iron recommended -- today, she returns after receiving IV feraheme infusions to total approximately 1gm IV iron.    I have reviewed the past medical history, past surgical history, social history and family history with the patient and they are unchanged from previous note.  ALLERGIES:  has No Known Allergies.  MEDICATIONS:  Current Outpatient Medications  Medication Sig Dispense Refill   albuterol (VENTOLIN HFA) 108 (90 Base) MCG/ACT inhaler Inhale 2 puffs into the lungs every 4 (four) hours as needed for wheezing or shortness of breath. 8 g 3   ALPRAZolam (XANAX) 0.5 MG tablet TAKE 1 TABLET(0.5 MG) BY MOUTH TWICE DAILY 180 tablet 0   aspirin 81 MG chewable tablet Chew 1 tablet (81 mg total) by mouth daily. 90 tablet 1   atorvastatin (LIPITOR) 10 MG tablet TAKE 1 TABLET BY MOUTH DAILY AT 6 PM 90 tablet 3   buPROPion (WELLBUTRIN SR) 150 MG 12 hr tablet TAKE 1 TABLET BY MOUTH TWICE DAILY 180 tablet 3   Cholecalciferol (EQL VITAMIN D3) 1000 units tablet Take 1 tablet (1,000 Units total) by mouth daily. 90 tablet 1   citalopram (CELEXA) 40 MG tablet TAKE 1 TABLET(40 MG) BY MOUTH DAILY 90 tablet 3   doxycycline (VIBRA-TABS) 100 MG tablet Take 1 tablet (100 mg total) by mouth 2 (two) times daily. 60 tablet 2   Ferric Maltol (ACCRUFER) 30 MG CAPS 1 po bid 60 capsule 5   gabapentin (NEURONTIN) 100 MG capsule TAKE 1 CAPSULE(100 MG) BY MOUTH THREE TIMES DAILY 90 capsule 3   GLYXAMBI 25-5 MG TABS  Take 1 tablet by mouth daily. Take 1 tablet by mouth daily. 90 tablet 3   metFORMIN (GLUCOPHAGE) 500 MG tablet TAKE 2 TABLETS TWICE DAILY WITH FOOD 360 tablet 0   mupirocin ointment (BACTROBAN) 2 % On leg wound w/dressing change qd or bid 60 g 1   ondansetron (ZOFRAN ODT) 4 MG disintegrating tablet Take 1 tablet (4 mg total) by mouth every 8 (eight) hours as needed for nausea or vomiting. 20 tablet 0   pantoprazole (PROTONIX) 40 MG tablet Take 1 tablet (40 mg total) by  mouth 2 (two) times daily. 180 tablet 3   repaglinide (PRANDIN) 2 MG tablet TAKE 1 TABLET BY MOUTH THREE TIMES DAILY BEFORE MEALS 270 tablet 3   Semaglutide, 2 MG/DOSE, (OZEMPIC, 2 MG/DOSE,) 8 MG/3ML SOPN INJECT 2 MG UNDER THE SKIN ONE DAY A WEEK 12 mL 2   No current facility-administered medications for this visit.     REVIEW OF SYSTEMS:   Constitutional: Denies fevers, chills or night sweats Eyes: Denies blurriness of vision Ears, nose, mouth, throat, and face: Denies mucositis or sore throat Respiratory: Denies cough, dyspnea or wheezes Cardiovascular: Denies palpitation, chest discomfort or lower extremity swelling Gastrointestinal:  Denies nausea, heartburn or change in bowel habits Skin: Denies abnormal skin rashes Lymphatics: Denies new lymphadenopathy or easy bruising Neurological:Denies numbness, tingling or new weaknesses Behavioral/Psych: Mood is stable, no new changes  All other systems were reviewed with the patient and are negative.  PHYSICAL EXAMINATION: ECOG PERFORMANCE STATUS: {CHL ONC ECOG PS:[id number]}  There were no vitals filed for this visit. There were no vitals filed for this visit.  GENERAL:alert, no distress and comfortable SKIN: skin color, texture, turgor are normal, no rashes or significant lesions EYES: normal, Conjunctiva are pink and non-injected, sclera clear OROPHARYNX:no exudate, no erythema and lips, buccal mucosa, and tongue normal  NECK: supple, thyroid normal size, non-tender, without nodularity LYMPH:  no palpable lymphadenopathy in the cervical, axillary or inguinal LUNGS: clear to auscultation and percussion with normal breathing effort HEART: regular rate & rhythm and no murmurs and no lower extremity edema ABDOMEN:abdomen soft, non-tender and normal bowel sounds Musculoskeletal:no cyanosis of digits and no clubbing  NEURO: alert & oriented x 3 with fluent speech, no focal motor/sensory deficits  LABORATORY DATA:  I have reviewed the  data as listed     Component Value Date/Time   NA 135 08/20/2023 1528   NA 135 (L) 12/06/2015 1316   K 3.8 08/20/2023 1528   K 3.7 12/06/2015 1316   CL 102 08/20/2023 1528   CO2 26 08/20/2023 1528   CO2 21 (L) 12/06/2015 1316   GLUCOSE 199 (H) 08/20/2023 1528   GLUCOSE 181 (H) 12/06/2015 1316   GLUCOSE 131 (H) 12/08/2006 1022   BUN 13 08/20/2023 1528   BUN 7.2 12/06/2015 1316   CREATININE 0.67 08/20/2023 1528   CREATININE 0.7 12/06/2015 1316   CALCIUM 8.9 08/20/2023 1528   CALCIUM 9.1 12/06/2015 1316   PROT 6.9 08/20/2023 1528   PROT 7.3 12/06/2015 1316   ALBUMIN 4.2 08/20/2023 1528   ALBUMIN 3.8 12/06/2015 1316   AST 23 08/20/2023 1528   AST 17 12/06/2015 1316   ALT 23 08/20/2023 1528   ALT 21 12/06/2015 1316   ALKPHOS 87 08/20/2023 1528   ALKPHOS 99 12/06/2015 1316   BILITOT 0.6 08/20/2023 1528   BILITOT 0.63 12/06/2015 1316   GFRNONAA >60 08/20/2023 1528   GFRAA 177 04/10/2008 1420     Lab Results  Component Value Date  WBC 9.6 08/20/2023   NEUTROABS 6.1 08/20/2023   HGB 10.8 (L) 08/20/2023   HCT 35.1 (L) 08/20/2023   MCV 79.6 (L) 08/20/2023   PLT 223 08/20/2023

## 2023-10-19 NOTE — Assessment & Plan Note (Signed)
Iron deficiency anemia: -- Etiology unknown as patient has no signs of bleeding including menstrual bleeding, hematochezia or melena. -- She has undergone endoscopic evaluation with EGD on 09/25/2020 and most recently a colonoscopy on 03/17/2023.  No evidence of bleeding was identified for either of those assessments.  We will reach out to GI to determine if a capsule endoscopy is warranted. -- Patient reports that her iron levels did not improve with p.o. iron therapy and thus, progressed to IV iron treatments. --Labs from today -- Recommend IV iron to boost her iron levels. --

## 2023-10-19 NOTE — Telephone Encounter (Signed)
Fax PA form was resent 10/22 (by PA team)

## 2023-10-20 ENCOUNTER — Other Ambulatory Visit: Payer: BC Managed Care – PPO

## 2023-10-20 ENCOUNTER — Ambulatory Visit (INDEPENDENT_AMBULATORY_CARE_PROVIDER_SITE_OTHER): Payer: BC Managed Care – PPO | Admitting: Internal Medicine

## 2023-10-20 ENCOUNTER — Other Ambulatory Visit: Payer: Self-pay

## 2023-10-20 ENCOUNTER — Other Ambulatory Visit (INDEPENDENT_AMBULATORY_CARE_PROVIDER_SITE_OTHER): Payer: BC Managed Care – PPO

## 2023-10-20 ENCOUNTER — Encounter: Payer: Self-pay | Admitting: Nurse Practitioner

## 2023-10-20 ENCOUNTER — Inpatient Hospital Stay: Payer: BC Managed Care – PPO | Attending: Physician Assistant | Admitting: Nurse Practitioner

## 2023-10-20 ENCOUNTER — Encounter: Payer: Self-pay | Admitting: Internal Medicine

## 2023-10-20 ENCOUNTER — Inpatient Hospital Stay: Payer: BC Managed Care – PPO

## 2023-10-20 VITALS — BP 164/87 | HR 90 | Temp 97.9°F | Resp 18 | Wt 202.8 lb

## 2023-10-20 VITALS — BP 150/78 | HR 86 | Ht 68.0 in | Wt 200.0 lb

## 2023-10-20 DIAGNOSIS — IMO0002 Reserved for concepts with insufficient information to code with codable children: Secondary | ICD-10-CM

## 2023-10-20 DIAGNOSIS — R5383 Other fatigue: Secondary | ICD-10-CM | POA: Diagnosis not present

## 2023-10-20 DIAGNOSIS — D509 Iron deficiency anemia, unspecified: Secondary | ICD-10-CM

## 2023-10-20 DIAGNOSIS — R1319 Other dysphagia: Secondary | ICD-10-CM | POA: Diagnosis not present

## 2023-10-20 DIAGNOSIS — Z7982 Long term (current) use of aspirin: Secondary | ICD-10-CM | POA: Diagnosis not present

## 2023-10-20 DIAGNOSIS — E1165 Type 2 diabetes mellitus with hyperglycemia: Secondary | ICD-10-CM | POA: Diagnosis not present

## 2023-10-20 DIAGNOSIS — K219 Gastro-esophageal reflux disease without esophagitis: Secondary | ICD-10-CM | POA: Diagnosis not present

## 2023-10-20 DIAGNOSIS — Z7984 Long term (current) use of oral hypoglycemic drugs: Secondary | ICD-10-CM | POA: Diagnosis not present

## 2023-10-20 DIAGNOSIS — Z79899 Other long term (current) drug therapy: Secondary | ICD-10-CM | POA: Diagnosis not present

## 2023-10-20 LAB — CBC WITH DIFFERENTIAL (CANCER CENTER ONLY)
Abs Immature Granulocytes: 0.07 10*3/uL (ref 0.00–0.07)
Basophils Absolute: 0.1 10*3/uL (ref 0.0–0.1)
Basophils Relative: 1 %
Eosinophils Absolute: 0.2 10*3/uL (ref 0.0–0.5)
Eosinophils Relative: 3 %
HCT: 43 % (ref 36.0–46.0)
Hemoglobin: 14 g/dL (ref 12.0–15.0)
Immature Granulocytes: 1 %
Lymphocytes Relative: 25 %
Lymphs Abs: 2.2 10*3/uL (ref 0.7–4.0)
MCH: 28.1 pg (ref 26.0–34.0)
MCHC: 32.6 g/dL (ref 30.0–36.0)
MCV: 86.2 fL (ref 80.0–100.0)
Monocytes Absolute: 0.4 10*3/uL (ref 0.1–1.0)
Monocytes Relative: 5 %
Neutro Abs: 5.9 10*3/uL (ref 1.7–7.7)
Neutrophils Relative %: 65 %
Platelet Count: 175 10*3/uL (ref 150–400)
RBC: 4.99 MIL/uL (ref 3.87–5.11)
RDW: 16.8 % — ABNORMAL HIGH (ref 11.5–15.5)
WBC Count: 9 10*3/uL (ref 4.0–10.5)
nRBC: 0 % (ref 0.0–0.2)

## 2023-10-20 LAB — BASIC METABOLIC PANEL
BUN: 10 mg/dL (ref 6–23)
CO2: 26 meq/L (ref 19–32)
Calcium: 9.3 mg/dL (ref 8.4–10.5)
Chloride: 103 meq/L (ref 96–112)
Creatinine, Ser: 0.58 mg/dL (ref 0.40–1.20)
GFR: 102.3 mL/min (ref >60.00)
Glucose, Bld: 128 mg/dL — ABNORMAL HIGH (ref 70–99)
Potassium: 3.3 meq/L — ABNORMAL LOW (ref 3.5–5.1)
Sodium: 137 meq/L (ref 135–145)

## 2023-10-20 LAB — IRON AND IRON BINDING CAPACITY (CC-WL,HP ONLY)
Iron: 62 ug/dL (ref 28–170)
Saturation Ratios: 17 % (ref 10.4–31.8)
TIBC: 360 ug/dL (ref 250–450)
UIBC: 298 ug/dL (ref 148–442)

## 2023-10-20 LAB — HEMOGLOBIN A1C: Hgb A1c MFr Bld: 7.8 % — ABNORMAL HIGH (ref 4.6–6.5)

## 2023-10-20 LAB — FERRITIN: Ferritin: 26 ng/mL (ref 11–307)

## 2023-10-20 NOTE — Patient Instructions (Signed)
You have been scheduled for an endoscopy. Please follow written instructions given to you at your visit today.  If you use inhalers (even only as needed), please bring them with you on the day of your procedure.  If you take any of the following medications, they will need to be adjusted prior to your procedure:   DO NOT TAKE 7 DAYS PRIOR TO TEST- Trulicity (dulaglutide) Ozempic, Wegovy (semaglutide) Mounjaro (tirzepatide) Bydureon Bcise (exanatide extended release)  DO NOT TAKE 1 DAY PRIOR TO YOUR TEST Rybelsus (semaglutide) Adlyxin (lixisenatide) Victoza (liraglutide) Byetta (exanatide) ___________________________________________________________________________   _______________________________________________________  If your blood pressure at your visit was 140/90 or greater, please contact your primary care physician to follow up on this.  _______________________________________________________  If you are age 45 or older, your body mass index should be between 23-30. Your Body mass index is 30.41 kg/m. If this is out of the aforementioned range listed, please consider follow up with your Primary Care Provider.  If you are age 41 or younger, your body mass index should be between 19-25. Your Body mass index is 30.41 kg/m. If this is out of the aformentioned range listed, please consider follow up with your Primary Care Provider.   ________________________________________________________  The [place] [facility] providers would like to encourage you to use [facility] to communicate with providers for non-urgent requests or questions.  Due to long hold times on the telephone, sending your provider a message by [facility] may be a faster and more efficient way to get a response.  Please allow 48 business hours for a response.  Please remember that this is for non-urgent requests.  _______________________________________________________

## 2023-10-20 NOTE — Progress Notes (Signed)
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  She is sent today by hematology regarding recurrent iron deficiency anemia.  She underwent colonoscopy and upper endoscopy in 2020 to evaluate iron deficiency anemia.  Colonoscopy revealed multiple subcentimeter adenomas but was otherwise normal.  Upper endoscopy revealed distal esophageal stricture which was dilated with 54 French Maloney dilator.  The patient responded to iron replacement with normalization of her hemoglobin.  I last saw the patient March 2024 regarding surveillance colonoscopy.  She was found to have multiple subcentimeter adenomas.  Follow-up in 3 years recommended.  No other abnormalities.  Hemoglobin January 2024 was normal at 13.0.  However, in August 2024 her hemoglobin was 10.8 with an MCV of 79.6.  She was found to be iron deficient.  She did receive iron supplementation intravenously and is followed by hematology.  Hemoglobin from today was normal at 14.0 with MCV 86.2.  Iron saturation 17%.  Ferritin pending.  Patient tells me that she is feeling well.  No evidence for GI bleeding by history.  She takes pantoprazole 40 mg twice daily to control GERD.  She does report some recurrent dysphagia to solids and feels that repeat dilation would be helpful.  She is no longer taking Ozempic.  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 Her smoking use included cigarettes. She has a 7.5 pack-year smoking history. She has never used smokeless tobacco. She reports that she does not drink alcohol and does not use drugs.  family history includes Colon cancer in her paternal aunt; Colon polyps in her maternal grandmother; Diabetes in her mother; Hypertension in her mother and another family member; Inflammatory bowel disease in her maternal aunt.  No Known Allergies     PHYSICAL EXAMINATION: Vital signs: BP (!) 150/78   Pulse 86   Ht 5\' 8"  (1.727 m)   Wt 200 lb (90.7 kg)   LMP 03/02/2017   BMI 30.41 kg/m   Constitutional: generally well-appearing, no acute distress Psychiatric: alert and oriented x3, cooperative Eyes: extraocular movements intact, anicteric, conjunctiva pink Mouth: oral pharynx moist, no lesions Neck: supple no lymphadenopathy Cardiovascular: heart regular rate and rhythm, no murmur Lungs: clear to auscultation bilaterally Abdomen: soft, nontender, nondistended, no obvious ascites, no peritoneal signs, normal bowel sounds, no organomegaly Rectal: Omitted Extremities: no clubbing, cyanosis, or lower extremity edema bilaterally Skin: no lesions on visible extremities Neuro: No focal deficits.  Cranial nerves intact  ASSESSMENT:  1.  Recurrent iron deficiency anemia.  She has again responded to iron replacement therapy.  Negative workup in 2020.  Duodenal biopsies were normal.  Question small bowel lesion, such as AVM. 2.  History of multiple adenomatous colon polyps.  Surveillance up-to-date 3.  GERD complicated by peptic stricture.  Reflux symptoms controlled with twice daily PPI.  Now with recurrent dysphagia. 4.  Recurrent dysphagia  secondary to known peptic stricture 5.  General Medical problems.  Stable   PLAN:  1.  Reflux precautions. 2.  Upper endoscopy/push enteroscopy and esophageal dilation.The nature of the procedure, as well as the risks, benefits, and alternatives were carefully and thoroughly reviewed with the patient. Ample time for discussion and questions allowed. The patient understood, was satisfied, and agreed to proceed. 3.  PPI 4.  Continue close follow-up with hematology 5.  If upper endoscopy/push enteroscopy does not reveal a cause for iron deficiency anemia, proceed with capsule endoscopy.  We discussed this in detail today. 6.  Surveillance colonoscopy around March 2027 A total time of visit 40 minutes was spent preparing to see the patient, reviewing the myriad of data and outside evaluations, obtaining comprehensive history, performing medically appropriate physical exam, counseling and educating the patient regarding the above listed issues, ordering diagnostic and therapeutic endoscopic procedure, and documenting clinical information in the health record

## 2023-10-21 ENCOUNTER — Telehealth: Payer: Self-pay | Admitting: Nurse Practitioner

## 2023-10-25 ENCOUNTER — Other Ambulatory Visit: Payer: Self-pay | Admitting: Internal Medicine

## 2023-10-25 MED ORDER — POTASSIUM CHLORIDE CRYS ER 20 MEQ PO TBCR: 20.0000 meq | EXTENDED_RELEASE_TABLET | Freq: Every day | ORAL | 5 refills | Status: DC

## 2023-10-26 ENCOUNTER — Ambulatory Visit: Payer: BC Managed Care – PPO | Admitting: Internal Medicine

## 2023-11-04 ENCOUNTER — Encounter: Payer: Self-pay | Admitting: Internal Medicine

## 2023-11-10 ENCOUNTER — Inpatient Hospital Stay: Payer: BC Managed Care – PPO

## 2023-11-23 ENCOUNTER — Encounter: Payer: Self-pay | Admitting: Internal Medicine

## 2023-11-23 ENCOUNTER — Ambulatory Visit: Payer: BC Managed Care – PPO | Admitting: Internal Medicine

## 2023-11-23 VITALS — BP 129/73 | HR 88 | Temp 98.3°F | Resp 19 | Ht 68.0 in | Wt 200.0 lb

## 2023-11-23 DIAGNOSIS — D509 Iron deficiency anemia, unspecified: Secondary | ICD-10-CM | POA: Diagnosis not present

## 2023-11-23 DIAGNOSIS — K449 Diaphragmatic hernia without obstruction or gangrene: Secondary | ICD-10-CM | POA: Diagnosis not present

## 2023-11-23 DIAGNOSIS — R1319 Other dysphagia: Secondary | ICD-10-CM

## 2023-11-23 DIAGNOSIS — K222 Esophageal obstruction: Secondary | ICD-10-CM | POA: Diagnosis not present

## 2023-11-23 DIAGNOSIS — R1310 Dysphagia, unspecified: Secondary | ICD-10-CM

## 2023-11-23 DIAGNOSIS — K219 Gastro-esophageal reflux disease without esophagitis: Secondary | ICD-10-CM | POA: Diagnosis present

## 2023-11-23 MED ORDER — SODIUM CHLORIDE 0.9 % IV SOLN: 500.0000 mL | Freq: Once | INTRAVENOUS | Status: DC

## 2023-11-23 NOTE — Progress Notes (Signed)
Pt's states no medical or surgical changes since previsit or office visit. 

## 2023-11-23 NOTE — Progress Notes (Signed)
Pt resting comfortably. VSS. Airway intact. SBAR complete to RN. All questions answered.   

## 2023-11-23 NOTE — Op Note (Signed)
[facility] Patient Name: [name] [name] Procedure Date: 11/23/2023 7:41 AM MRN: [id number] Endoscopist: Wilhemina Bonito. Marina Goodell , MD, [id number] Age: 57 Referring MD:  Date of Birth: 09-08-1966 Gender: Female Account #: [id number] Procedure:                Upper GI endoscopy with submucosal injection.                            Dilation 87F Indications:              Iron deficiency anemia, Dysphagia, Esophageal reflux Medicines:                Monitored Anesthesia Care Procedure:                Pre-Anesthesia Assessment:                           - Prior to the procedure, a History and Physical                            was performed, and patient medications and                            allergies were reviewed. The patient's tolerance of                            previous anesthesia was also reviewed. The risks                            and benefits of the procedure and the sedation                            options and risks were discussed with the patient.                            All questions were answered, and informed consent                            was obtained. Prior Anticoagulants: The patient has                            taken no anticoagulant or antiplatelet agents. ASA                            Grade Assessment: II - A patient with mild systemic                            disease. After reviewing the risks and benefits,                            the patient was deemed in satisfactory condition to                            undergo the procedure.  After obtaining informed consent, the endoscope was                            passed under direct vision. Throughout the                            procedure, the patient's blood pressure, pulse, and                            oxygen saturations were monitored continuously. The                            PCF-H190TL Slim SN [id number] was introduced through                            the  mouth, and advanced to the mid-jejunum. The                            upper GI endoscopy was accomplished without                            difficulty. The patient tolerated the procedure                            well. Scope In: Scope Out: Findings:                 The esophagus revealed a large caliber distal                            esophageal ring. The esophagus was otherwise                            normal.. The scope was withdrawn after completing                            the endoscopic survey.. Dilation was performed with                            a Maloney dilator with no resistance at 54 Fr. no                            resistance or heme.                           The stomach was normal, save small hiatal hernia.                           The examined duodenum was normal. The jejunum was                            normal to the midportion. A marking tattoo was                            placed at the  distal most extent of the examination                           The cardia and gastric fundus were normal on                            retroflexion. Complications:            No immediate complications. Estimated Blood Loss:     Estimated blood loss: none. Impression:               1. Esophageal stricture. Dilated                           2. GERD                           3. Otherwise normal enteroscopy to mid jejunum.                            Tattooed.. Recommendation:           - Patient has a contact number available for                            emergencies. The signs and symptoms of potential                            delayed complications were discussed with the                            patient. Return to normal activities tomorrow.                            Written discharge instructions were provided to the                            patient.                           - Post dilation diet.                           - Continue present medications.                            - PLEASE SCHEDULE CAPSULE ENDOSCOPY "iron                            deficiency anemia, rule out small bowel lesion". Wilhemina Bonito. Marina Goodell, MD 11/23/2023 9:15:42 AM This report has been signed electronically.

## 2023-11-23 NOTE — Progress Notes (Signed)
Called to room to assist during endoscopic procedure.  Patient ID and intended procedure confirmed with present staff. Received instructions for my participation in the procedure from the performing physician.  

## 2023-11-23 NOTE — Progress Notes (Signed)
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  She is sent today by hematology regarding recurrent iron deficiency anemia.   She underwent colonoscopy and upper endoscopy in 2020 to evaluate iron deficiency anemia.  Colonoscopy revealed multiple subcentimeter adenomas but was otherwise normal.  Upper endoscopy revealed distal esophageal stricture which was dilated with 54 French Maloney dilator.  The patient responded to iron replacement with normalization of her hemoglobin.  I last saw the patient March 2024 regarding surveillance colonoscopy.  She was found to have multiple subcentimeter adenomas.  Follow-up in 3 years recommended.  No other abnormalities.   Hemoglobin January 2024 was normal at 13.0.  However, in August 2024 her hemoglobin was 10.8 with an MCV of 79.6.  She was found to be iron deficient.  She did receive iron supplementation intravenously and is followed by hematology.  Hemoglobin from today was normal at 14.0 with MCV 86.2.  Iron saturation 17%.  Ferritin pending.   Patient tells me that she is feeling well.  No evidence for GI bleeding by history.  She takes pantoprazole 40 mg twice daily to control GERD.  She does report some recurrent dysphagia to solids and feels that repeat dilation would be helpful.  She is no longer taking Ozempic.   REVIEW OF SYSTEMS:   All non-GI ROS negative unless otherwise stated in the HPI except for anxiety, depression, fatigue, arthritis, headaches, excessive thirst, excessive urination, urinary leakage       Past Medical History:  Diagnosis Date   Allergy     Anemia     Anxiety     Cataract     CHF (congestive heart failure) (HCC) 2005    peripartum cardiomyopathy   Depression     Diabetes mellitus      type II    GERD (gastroesophageal reflux disease)     Hepatitis 2006    CMV   Hydradenitis      right   Neuromuscular disorder (HCC)                 Past Surgical History:  Procedure Laterality Date   CESAREAN SECTION        x 2   CHOLECYSTECTOMY   2006          Social History [name] [name]  reports that she has quit smoking. Her smoking use included cigarettes. She has a 7.5 pack-year smoking history. She has never used smokeless tobacco. She reports that she does not drink alcohol and does not use drugs.   family history includes Colon cancer in her paternal aunt; Colon polyps in her maternal grandmother; Diabetes in her mother; Hypertension in her mother and another family member; Inflammatory bowel disease in her maternal aunt.   Allergies  No Known Allergies         PHYSICAL EXAMINATION: Vital signs: BP (!) 150/78   Pulse 86   Ht 5\' 8"  (1.727 m)   Wt 200 lb (90.7 kg)   LMP 03/02/2017   BMI 30.41 kg/m   Constitutional: generally well-appearing, no acute distress Psychiatric: alert and oriented x3, cooperative Eyes: extraocular movements intact, anicteric, conjunctiva pink Mouth: oral pharynx moist, no lesions Neck: supple no lymphadenopathy Cardiovascular: heart regular rate and rhythm, no murmur Lungs: clear to auscultation bilaterally Abdomen: soft,  nontender, nondistended, no obvious ascites, no peritoneal signs, normal bowel sounds, no organomegaly Rectal: Omitted Extremities: no clubbing, cyanosis, or lower extremity edema bilaterally Skin: no lesions on visible extremities Neuro: No focal deficits.  Cranial nerves intact   ASSESSMENT:   1.  Recurrent iron deficiency anemia.  She has again responded to iron replacement therapy.  Negative workup in 2020.  Duodenal biopsies were normal.  Question small bowel lesion, such as AVM. 2.  History of multiple adenomatous colon polyps.  Surveillance up-to-date 3.  GERD complicated by peptic stricture.  Reflux symptoms  controlled with twice daily PPI.  Now with recurrent dysphagia. 4.  Recurrent dysphagia secondary to known peptic stricture 5.  General Medical problems.  Stable     PLAN:   1.  Reflux precautions. 2.  Upper endoscopy/push enteroscopy and esophageal dilation.The nature of the procedure, as well as the risks, benefits, and alternatives were carefully and thoroughly reviewed with the patient. Ample time for discussion and questions allowed. The patient understood, was satisfied, and agreed to proceed. 3.  PPI 4.  Continue close follow-up with hematology 5.  If upper endoscopy/push enteroscopy does not reveal a cause for iron deficiency anemia, proceed with capsule endoscopy.  We discussed this in detail today. 6.  Surveillance colonoscopy around March 2027

## 2023-11-23 NOTE — Patient Instructions (Signed)
Discharge instructions given. Handout on Dilatation diet. Resume previous medications. Office will call to schedule procedure. YOU HAD AN ENDOSCOPIC PROCEDURE TODAY AT THE [facility]:   Refer to the procedure report that was given to you for any specific questions about what was found during the examination.  If the procedure report does not answer your questions, please call your gastroenterologist to clarify.  If you requested that your care partner not be given the details of your procedure findings, then the procedure report has been included in a sealed envelope for you to review at your convenience later.  YOU SHOULD EXPECT: Some feelings of bloating in the abdomen. Passage of more gas than usual.  Walking can help get rid of the air that was put into your GI tract during the procedure and reduce the bloating. If you had a lower endoscopy (such as a colonoscopy or flexible sigmoidoscopy) you may notice spotting of blood in your stool or on the toilet paper. If you underwent a bowel prep for your procedure, you may not have a normal bowel movement for a few days.  Please Note:  You might notice some irritation and congestion in your nose or some drainage.  This is from the oxygen used during your procedure.  There is no need for concern and it should clear up in a day or so.  SYMPTOMS TO REPORT IMMEDIATELY:   Following upper endoscopy (EGD)  Vomiting of blood or coffee ground material  New chest pain or pain under the shoulder blades  Painful or persistently difficult swallowing  New shortness of breath  Fever of 100F or higher  Black, tarry-looking stools  For urgent or emergent issues, a gastroenterologist can be reached at any hour by calling [phone]. Do not use MyChart messaging for urgent concerns.    DIET:  We do recommend a small meal at first, but then you may proceed to your regular diet.  Drink plenty of fluids but you should avoid alcoholic beverages for  24 hours.  ACTIVITY:  You should plan to take it easy for the rest of today and you should NOT DRIVE or use heavy machinery until tomorrow (because of the sedation medicines used during the test).    FOLLOW UP: Our staff will call the number listed on your records the next business day following your procedure.  We will call around 7:15- 8:00 am to check on you and address any questions or concerns that you may have regarding the information given to you following your procedure. If we do not reach you, we will leave a message.     If any biopsies were taken you will be contacted by phone or by letter within the next 1-3 weeks.  Please call us at [phone] if you have not heard about the biopsies in 3 weeks.    SIGNATURES/CONFIDENTIALITY: You and/or your care partner have signed paperwork which will be entered into your electronic medical record.  These signatures attest to the fact that that the information above on your After Visit Summary has been reviewed and is understood.  Full responsibility of the confidentiality of this discharge information lies with you and/or your care-partner.

## 2023-11-24 ENCOUNTER — Telehealth: Payer: Self-pay

## 2023-11-24 NOTE — Telephone Encounter (Signed)
  Follow up Call-     11/23/2023    7:33 AM 03/17/2023    7:18 AM  Call back number  Post procedure Call Back phone  # [phone]  Permission to leave phone message Yes Yes     Patient questions:  Do you have a fever, pain , or abdominal swelling? No. Pain Score  0 *  Have you tolerated food without any problems? Yes.    Have you been able to return to your normal activities? Yes.    Do you have any questions about your discharge instructions: Diet   No. Medications  No. Follow up visit  No.  Do you have questions or concerns about your Care? No.  Actions: * If pain score is 4 or above: No action needed, pain <4.

## 2023-12-01 ENCOUNTER — Other Ambulatory Visit: Payer: Self-pay | Admitting: Internal Medicine

## 2023-12-01 DIAGNOSIS — D508 Other iron deficiency anemias: Secondary | ICD-10-CM

## 2023-12-02 ENCOUNTER — Ambulatory Visit (INDEPENDENT_AMBULATORY_CARE_PROVIDER_SITE_OTHER): Payer: BC Managed Care – PPO | Admitting: Internal Medicine

## 2023-12-02 ENCOUNTER — Encounter: Payer: Self-pay | Admitting: Internal Medicine

## 2023-12-02 VITALS — BP 152/78 | HR 88 | Temp 97.9°F | Ht 68.0 in | Wt 204.8 lb

## 2023-12-02 DIAGNOSIS — E1169 Type 2 diabetes mellitus with other specified complication: Secondary | ICD-10-CM

## 2023-12-02 DIAGNOSIS — G43009 Migraine without aura, not intractable, without status migrainosus: Secondary | ICD-10-CM | POA: Diagnosis not present

## 2023-12-02 DIAGNOSIS — F419 Anxiety disorder, unspecified: Secondary | ICD-10-CM

## 2023-12-02 DIAGNOSIS — E669 Obesity, unspecified: Secondary | ICD-10-CM

## 2023-12-02 DIAGNOSIS — Z0000 Encounter for general adult medical examination without abnormal findings: Secondary | ICD-10-CM

## 2023-12-02 DIAGNOSIS — Z7985 Long-term (current) use of injectable non-insulin antidiabetic drugs: Secondary | ICD-10-CM

## 2023-12-02 MED ORDER — PHENTERMINE HCL 37.5 MG PO TABS
37.5000 mg | ORAL_TABLET | Freq: Every day | ORAL | 2 refills | Status: DC
Start: 2023-12-02 — End: 2024-05-15

## 2023-12-02 MED ORDER — OZEMPIC (0.25 OR 0.5 MG/DOSE) 2 MG/3ML ~~LOC~~ SOPN: PEN_INJECTOR | SUBCUTANEOUS | 1 refills | Status: DC

## 2023-12-02 NOTE — Assessment & Plan Note (Signed)
Continue with citalopram and Wellbutrin Continue with alprazolam as needed  Potential benefits of a long term benzodiazepines  use as well as potential risks  and complications were explained to the patient and were aknowledged. 

## 2023-12-02 NOTE — Assessment & Plan Note (Signed)
No HAs 

## 2023-12-02 NOTE — Progress Notes (Signed)
Subjective:  Patient ID: [name] [name], female    DOB: [birth year]  Age: 57 y.o. MRN: [id number]  CC: Medical Management of Chronic Issues (Follow up, blood test. ) and Hyperglycemia (Last set of labs showed high glucose levels as well as low potassium)   HPI [name] [name] presents for DM, HTN, anxiety  Outpatient Medications Prior to Visit  Medication Sig Dispense Refill   albuterol (VENTOLIN HFA) 108 (90 Base) MCG/ACT inhaler Inhale 2 puffs into the lungs every 4 (four) hours as needed for wheezing or shortness of breath. 8 g 3   ALPRAZolam (XANAX) 0.5 MG tablet TAKE 1 TABLET(0.5 MG) BY MOUTH TWICE DAILY 180 tablet 0   aspirin 81 MG chewable tablet Chew 1 tablet (81 mg total) by mouth daily. 90 tablet 1   atorvastatin (LIPITOR) 10 MG tablet TAKE 1 TABLET BY MOUTH DAILY AT 6 PM 90 tablet 3   buPROPion (WELLBUTRIN SR) 150 MG 12 hr tablet TAKE 1 TABLET BY MOUTH TWICE DAILY 180 tablet 0   Cholecalciferol (EQL VITAMIN D3) 1000 units tablet Take 1 tablet (1,000 Units total) by mouth daily. 90 tablet 1   citalopram (CELEXA) 40 MG tablet TAKE 1 TABLET(40 MG) BY MOUTH DAILY 90 tablet 3   doxycycline (VIBRA-TABS) 100 MG tablet Take 1 tablet (100 mg total) by mouth 2 (two) times daily. 60 tablet 2   gabapentin (NEURONTIN) 100 MG capsule TAKE 1 CAPSULE(100 MG) BY MOUTH THREE TIMES DAILY 90 capsule 3   GLYXAMBI 25-5 MG TABS Take 1 tablet by mouth daily. Take 1 tablet by mouth daily. 90 tablet 3   metFORMIN (GLUCOPHAGE) 500 MG tablet TAKE 2 TABLETS TWICE DAILY WITH FOOD 360 tablet 0   mupirocin ointment (BACTROBAN) 2 % On leg wound w/dressing change qd or bid 60 g 1   ondansetron (ZOFRAN ODT) 4 MG disintegrating tablet Take 1 tablet (4 mg total) by mouth every 8 (eight) hours as needed for nausea or vomiting. 20 tablet 0   pantoprazole (PROTONIX) 40 MG tablet Take 1 tablet (40 mg total) by mouth 2 (two) times daily. 180 tablet 3   potassium chloride SA (KLOR-CON M) 20 MEQ tablet Take 1 tablet (20  mEq total) by mouth daily. 30 tablet 5   repaglinide (PRANDIN) 2 MG tablet TAKE 1 TABLET BY MOUTH THREE TIMES DAILY BEFORE MEALS 270 tablet 3   Semaglutide, 2 MG/DOSE, (OZEMPIC, 2 MG/DOSE,) 8 MG/3ML SOPN INJECT 2 MG UNDER THE SKIN ONE DAY A WEEK 12 mL 2   No facility-administered medications prior to visit.    ROS: Review of Systems  Constitutional:  Negative for activity change, appetite change, chills, fatigue and unexpected weight change.  HENT:  Negative for congestion, mouth sores and sinus pressure.   Eyes:  Negative for visual disturbance.  Respiratory:  Negative for cough and chest tightness.   Gastrointestinal:  Negative for abdominal pain and nausea.  Genitourinary:  Negative for difficulty urinating, frequency and vaginal pain.  Musculoskeletal:  Negative for back pain and gait problem.  Skin:  Negative for pallor and rash.  Neurological:  Negative for dizziness, tremors, weakness, numbness and headaches.  Psychiatric/Behavioral:  Negative for confusion and sleep disturbance.          Objective:  BP (!) 152/78   Pulse 88   Temp 97.9 F (36.6 C) (Oral)   Ht 5\' 8"  (1.727 m)   Wt 204 lb 12.8 oz (92.9 kg)   LMP 03/02/2017   SpO2 97%   BMI  31.14 kg/m   BP Readings from Last 3 Encounters:  12/02/23 (!) 152/78  11/23/23 129/73  10/20/23 (!) 150/78    Wt Readings from Last 3 Encounters:  12/02/23 204 lb 12.8 oz (92.9 kg)  11/23/23 200 lb (90.7 kg)  10/20/23 200 lb (90.7 kg)    Physical Exam Constitutional:      General: She is not in acute distress.    Appearance: She is well-developed. She is obese.  HENT:     Head: Normocephalic.     Right Ear: External ear normal.     Left Ear: External ear normal.     Nose: Nose normal.  Eyes:     General:        Right eye: No discharge.        Left eye: No discharge.     Conjunctiva/sclera: Conjunctivae normal.     Pupils: Pupils are equal, round, and reactive to light.  Neck:     Thyroid: No thyromegaly.      Vascular: No JVD.     Trachea: No tracheal deviation.  Cardiovascular:     Rate and Rhythm: Normal rate and regular rhythm.     Heart sounds: Normal heart sounds.  Pulmonary:     Effort: No respiratory distress.     Breath sounds: No stridor. No wheezing.  Abdominal:     General: Bowel sounds are normal. There is no distension.     Palpations: Abdomen is soft. There is no mass.     Tenderness: There is no abdominal tenderness. There is no guarding or rebound.  Musculoskeletal:        General: No tenderness.     Cervical back: Normal range of motion and neck supple. No rigidity.  Lymphadenopathy:     Cervical: No cervical adenopathy.  Skin:    Findings: No erythema or rash.  Neurological:     Mental Status: She is oriented to person, place, and time.     Cranial Nerves: No cranial nerve deficit.     Motor: No abnormal muscle tone.     Coordination: Coordination normal.     Deep Tendon Reflexes: Reflexes normal.  Psychiatric:        Behavior: Behavior normal.        Thought Content: Thought content normal.        Judgment: Judgment normal.     Lab Results  Component Value Date   WBC 9.0 10/20/2023   HGB 14.0 10/20/2023   HCT 43.0 10/20/2023   PLT 175 10/20/2023   GLUCOSE 128 (H) 10/20/2023   CHOL 156 08/11/2019   TRIG (H) 08/11/2019    554.0 Triglyceride is over 400; calculations on Lipids are invalid.   HDL 28.10 (L) 08/11/2019   LDLDIRECT 69.0 08/11/2019   LDLCALC 97 10/23/2017   ALT 23 08/20/2023   AST 23 08/20/2023   NA 137 10/20/2023   K 3.3 (L) 10/20/2023   CL 103 10/20/2023   CREATININE 0.58 10/20/2023   BUN 10 10/20/2023   CO2 26 10/20/2023   TSH 2.263 01/03/2023   HGBA1C 7.8 (H) 10/20/2023   MICROALBUR 2.9 (H) 07/31/2020    DG Chest 2 View  Result Date: 01/03/2023 CLINICAL DATA:  chest pain EXAM: CHEST - 2 VIEW COMPARISON:  January 12, 2009 FINDINGS: The cardiomediastinal silhouette is unchanged in contour. No pleural effusion. No pneumothorax. No  acute pleuroparenchymal abnormality. Visualized abdomen is unremarkable. Multilevel degenerative changes of the thoracic spine. IMPRESSION: No acute cardiopulmonary abnormality. Electronically Signed  By: Meda Klinefelter M.D.   On: 01/03/2023 18:15    Assessment & Plan:   Problem List Items Addressed This Visit     Diabetes mellitus type 2 in obese - Primary    Kryssa was out of Ozempic x 2 week. Re-started Check labs Coronary calcium CT ordered      Relevant Medications   Semaglutide,0.25 or 0.5MG /DOS, (OZEMPIC, 0.25 OR 0.5 MG/DOSE,) 2 MG/3ML SOPN   Other Relevant Orders   TSH   Urinalysis   Lipid panel   Comprehensive metabolic panel   Hemoglobin A1c   Anxiety disorder    Continue with citalopram and Wellbutrin Continue with alprazolam as needed  Potential benefits of a long term benzodiazepines  use as well as potential risks  and complications were explained to the patient and were aknowledged.      Relevant Orders   TSH   Urinalysis   Lipid panel   Comprehensive metabolic panel   Migraine    No HAs       Well adult exam   Relevant Orders   TSH   Urinalysis   Lipid panel   Comprehensive metabolic panel      Meds ordered this encounter  Medications   Semaglutide,0.25 or 0.5MG /DOS, (OZEMPIC, 0.25 OR 0.5 MG/DOSE,) 2 MG/3ML SOPN    Sig: Use 0.25 mg weekly sq for 1 month, then 0.5 mg sq weekly    Dispense:  9 mL    Refill:  1   phentermine (ADIPEX-P) 37.5 MG tablet    Sig: Take 1 tablet (37.5 mg total) by mouth daily before breakfast.    Dispense:  30 tablet    Refill:  2      Follow-up: Return in about 3 months (around 03/01/2024) for Wellness Exam.  Sonda Primes, MD

## 2023-12-02 NOTE — Assessment & Plan Note (Signed)
Deedee was out of Ozempic x 2 week. Re-started Check labs Coronary calcium CT ordered 

## 2023-12-08 ENCOUNTER — Telehealth: Payer: Self-pay

## 2023-12-08 ENCOUNTER — Inpatient Hospital Stay: Payer: BC Managed Care – PPO | Attending: Physician Assistant

## 2023-12-08 DIAGNOSIS — D509 Iron deficiency anemia, unspecified: Secondary | ICD-10-CM

## 2023-12-08 LAB — CBC WITH DIFFERENTIAL (CANCER CENTER ONLY)
Abs Immature Granulocytes: 0.04 10*3/uL (ref 0.00–0.07)
Basophils Absolute: 0.1 10*3/uL (ref 0.0–0.1)
Basophils Relative: 1 %
Eosinophils Absolute: 0.2 10*3/uL (ref 0.0–0.5)
Eosinophils Relative: 3 %
HCT: 40.4 % (ref 36.0–46.0)
Hemoglobin: 13.3 g/dL (ref 12.0–15.0)
Immature Granulocytes: 1 %
Lymphocytes Relative: 27 %
Lymphs Abs: 2.2 10*3/uL (ref 0.7–4.0)
MCH: 28.5 pg (ref 26.0–34.0)
MCHC: 32.9 g/dL (ref 30.0–36.0)
MCV: 86.7 fL (ref 80.0–100.0)
Monocytes Absolute: 0.4 10*3/uL (ref 0.1–1.0)
Monocytes Relative: 5 %
Neutro Abs: 5.2 10*3/uL (ref 1.7–7.7)
Neutrophils Relative %: 63 %
Platelet Count: 181 10*3/uL (ref 150–400)
RBC: 4.66 MIL/uL (ref 3.87–5.11)
RDW: 13.9 % (ref 11.5–15.5)
WBC Count: 8.1 10*3/uL (ref 4.0–10.5)
nRBC: 0 % (ref 0.0–0.2)

## 2023-12-08 LAB — IRON AND IRON BINDING CAPACITY (CC-WL,HP ONLY)
Iron: 62 ug/dL (ref 28–170)
Saturation Ratios: 17 % (ref 10.4–31.8)
TIBC: 360 ug/dL (ref 250–450)
UIBC: 298 ug/dL (ref 148–442)

## 2023-12-08 NOTE — Telephone Encounter (Signed)
Capsule scheduled with patient and instructions sent in mychart.  Patient will call with any questions

## 2023-12-08 NOTE — Telephone Encounter (Signed)
CAPSULE ENDOSCOPY PATIENT INSTRUCTION [name] [name] August 11, 1966 [id number]   12/14/2023 Seven (7) days prior to capsule endoscopy stop taking iron supplements and carafate.  12/19/2023 Two (2) days prior to capsule endoscopy stop taking aspirin or any arthritis drugs.  12/20/2023 Day before capsule endoscopy purchase a 238 gram bottle of Miralax from the laxative section of your drug store, and a 32 oz. bottle of Gatorade (no red).    12/20/2023 One (1) day prior to capsule endoscopy: Stop smoking. Eat a regular diet until 12:00 Noon. After 12:00 Noon take only the following: Black coffee  Jell-O (no fruit or red Jell-o) Water   Bouillon (chicken or beef) 7-Up   Cranberry Juice Tea   Kool-Aid Popsicle (not red) Sprite   Coke Ginger Ale  Pepsi Mountain Dew Gatorade At 6:00 pm the evening before your appointment, drink 7 capfuls (105 grams) of Miralax with 32 oz. Gatorade. Drink 8 oz every 15 minutes until gone. Nothing to eat or drink after midnight except medications with a sip of water.  12/21/2023 Day of capsule endoscopy:  No medications for 2 hours prior to your test.  Please arrive at [facility]  3rd floor patient registration area by 8:30am on: 12/21/2023.   For any questions: Call [facility] at [phone] and ask to speak with one of the capsule endoscopy nurses.  YOU WILL NEED TO RETURN THE EQUIPMENT AT 4 PM ON THE DAY OF THE PROCEDURE.  PLEASE KEEP THIS IN MIND WHEN SCHEDULING.    Small Bowel Capsule Endoscopy  What you should know: Small Bowel capsule endoscopy is a procedure that takes pictures of the inside of your small intestine (bowel).  Your small bowel connects to your stomach on one end, and your large bowel (colon) on the other.  A capsule endoscopy is done by swallowing a pill size camera.  The capsule moves through your stomach and into your small bowel, where pictures are taken.   You may need a small bowel capsule endoscopy if  you have symptoms, such as blood in your stool, chronic stomach pain, and diarrhea.  The pictures may show if you have growths, swelling, and bleeding area in you small bowel.  A capsule endoscopy may also show if diseases such as Crohn's or celiac disease are causing your symptoms.  Having a small bowel capsule endoscopy may help you and your caregiver learn the cause of your symptoms.  Learning what is causing your symptoms allows you to receive needed treatment and prevent further problems. Risks: You may have stomach pain during your procedure.   The pictures taken by the capsule may not be clear.   The pictures may not show the cause of your symptoms.   You may need another endoscopy procedure.   The capsule may get trapped in your esophagus or intestines. You may need surgery or additional procedures to remove the capsule from your body.    Before your procedure: You will be instructed to stop certain prescription medications or over- the -counter medications prior to the procedure.   The day before your scheduled appointment you will need to be on a restricted diet and will need to drink a bowel prep that will clean out your bowels.   The day of the procedure: You may drive yourself to the procedure.   You will need to plan on 2 trips to the office on the day of the procedure. Morning: Plan to be at the office about 45 minutes. The morning  of the procedure a sensor belt and recorder will be placed on you.  You will wear this for 8 hours.  (The sensor belt transfers pictures of your small bowel to the recorder.)   You will be given a pill-sized capsule endoscope to swallow.  Once you swallow the capsule it will travel through your body the same way food does, constantly taking pictures along the way.  The capsule takes 2-3 pictures a second.   Once you have left the office you may go about your normal day with a few exceptions: You may not go near a MRI machine or a radio or television  towers; You need to avoid other patients having capsule endoscopy; You will be given a written diet to follow for the day.  Afternoon: You will need to be return to the office at your designated time. The sensors belt will be removed You will need to be at the office about 15 minutes.

## 2023-12-09 LAB — FERRITIN: Ferritin: 23 ng/mL (ref 11–307)

## 2023-12-20 ENCOUNTER — Encounter: Payer: Self-pay | Admitting: Internal Medicine

## 2023-12-21 ENCOUNTER — Encounter: Payer: Self-pay | Admitting: Physician Assistant

## 2023-12-21 ENCOUNTER — Encounter: Payer: Self-pay | Admitting: Oncology

## 2024-01-08 ENCOUNTER — Other Ambulatory Visit: Payer: Self-pay | Admitting: Internal Medicine

## 2024-01-15 ENCOUNTER — Other Ambulatory Visit (HOSPITAL_COMMUNITY): Payer: Self-pay

## 2024-01-18 ENCOUNTER — Other Ambulatory Visit (HOSPITAL_COMMUNITY): Payer: Self-pay

## 2024-01-19 ENCOUNTER — Inpatient Hospital Stay: Payer: BC Managed Care – PPO | Attending: Physician Assistant

## 2024-01-19 ENCOUNTER — Inpatient Hospital Stay (HOSPITAL_BASED_OUTPATIENT_CLINIC_OR_DEPARTMENT_OTHER): Payer: BC Managed Care – PPO | Admitting: Nurse Practitioner

## 2024-01-19 ENCOUNTER — Encounter: Payer: Self-pay | Admitting: Nurse Practitioner

## 2024-01-19 VITALS — BP 133/74 | HR 100 | Temp 97.2°F | Resp 17 | Wt 205.0 lb

## 2024-01-19 DIAGNOSIS — D509 Iron deficiency anemia, unspecified: Secondary | ICD-10-CM

## 2024-01-19 LAB — CBC WITH DIFFERENTIAL (CANCER CENTER ONLY)
Abs Immature Granulocytes: 0.1 10*3/uL — ABNORMAL HIGH (ref 0.00–0.07)
Basophils Absolute: 0.1 10*3/uL (ref 0.0–0.1)
Basophils Relative: 1 %
Eosinophils Absolute: 0.3 10*3/uL (ref 0.0–0.5)
Eosinophils Relative: 3 %
HCT: 40.3 % (ref 36.0–46.0)
Hemoglobin: 13.3 g/dL (ref 12.0–15.0)
Immature Granulocytes: 1 %
Lymphocytes Relative: 24 %
Lymphs Abs: 2.3 10*3/uL (ref 0.7–4.0)
MCH: 29 pg (ref 26.0–34.0)
MCHC: 33 g/dL (ref 30.0–36.0)
MCV: 88 fL (ref 80.0–100.0)
Monocytes Absolute: 0.5 10*3/uL (ref 0.1–1.0)
Monocytes Relative: 5 %
Neutro Abs: 6.5 10*3/uL (ref 1.7–7.7)
Neutrophils Relative %: 66 %
Platelet Count: 195 10*3/uL (ref 150–400)
RBC: 4.58 MIL/uL (ref 3.87–5.11)
RDW: 14 % (ref 11.5–15.5)
WBC Count: 9.8 10*3/uL (ref 4.0–10.5)
nRBC: 0 % (ref 0.0–0.2)

## 2024-01-19 LAB — FERRITIN: Ferritin: 27 ng/mL (ref 11–307)

## 2024-01-19 LAB — IRON AND IRON BINDING CAPACITY (CC-WL,HP ONLY)
Iron: 65 ug/dL (ref 28–170)
Saturation Ratios: 19 % (ref 10.4–31.8)
TIBC: 349 ug/dL (ref 250–450)
UIBC: 284 ug/dL (ref 148–442)

## 2024-01-19 NOTE — Progress Notes (Signed)
Patient Care Team: [name], [name], MD as PCP - General Huel Cote, MD (Obstetrics and Gynecology) Lewayne Bunting, MD (Cardiology) Jaci Standard, MD as Consulting Physician (Hematology and Oncology)  Clinic Day:  01/19/2024  Referring physician: Tresa Garter, MD  ASSESSMENT & PLAN:   Assessment & Plan: Iron deficiency anemia -- Etiology unknown as patient has no signs of bleeding including menstrual bleeding, hematochezia or melena. -- She has undergone endoscopic evaluation with EGD on 09/25/2020 and most recently a colonoscopy on 03/17/2023.  No evidence of bleeding was identified for either of those assessments.  We will reach out to GI to determine if a capsule endoscopy is warranted. -- Patient reports that her iron levels did not improve with p.o. iron therapy and thus, progressed to IV iron treatments. --today, 10/20/2023, she returns after receiving 1 gm Feraheme. Blood count has improved with Hgb and Hct within normal limits. The patient reports improvement in symptoms.  -- She was to have capsule endoscopy on 12/21/2023, but did get sick.  Had to cancel that appointment.  She would like to hold off on this study for right now.  Her blood counts have remained within normal limits over last few months. -Continue monthly blood counts with iron studies.  Treat with IV iron as indicated.   -Labs with follow-up in 3 months.   Plan: Labs reviewed  -CBC showing WBC 9.8; Hgb 13.3; Hct 40.3; MCV 88.0; plt 195; Anc 6.5 -Iron 65; TIBC 349; sat ratio 19; UIBC 284 -Ferritin -pending -Blood counts and iron panel have been normal for the past 4 months.  No need for additional IV iron at this time. -"Okay" to hold off on capsule endoscopy for now.  Will have patient reschedule if blood count and iron begin to trend in downward direction.  The patient voices understanding and agreement with this plan. -Continue monthly lab checks -Labs with follow-up in 3 months.  If  labs continue to be within normal limits, will extend time between lab checks and office visits.  The patient understands the plans discussed today and is in agreement with them.  She knows to contact our office if she develops concerns prior to her next appointment.  I provided 20 minutes of face-to-face time during this encounter and > 50% was spent counseling as documented under my assessment and plan.    [name] Jews, NP  [facility] - A DEPT OF [facility] [address] [place] [address] Dept: [phone] Dept Fax: [phone]   No orders of the defined types were placed in this encounter.     CHIEF COMPLAINT:  CC: iron deficiency anemia  Current Treatment:  IV iron as needed   INTERVAL HISTORY:  [name] [name] is here today for repeat clinical assessment.  She was last seen by myself on 10/20/2023.  Has not required IV iron since then.  Has occasional dizziness, but not due to IV iron.  She is having this worked up through primary care provider.  Reports improved fatigue.  Denies shortness of breath, chest pain, or palpitations.  She denies obvious signs of bleeding including blood in the stool, hematemesis, or epistaxis. She denies headaches or visual disturbances. She denies abdominal pain, nausea, vomiting, or changes in bowel or bladder habits.  She denies fevers or chills. She denies pain. Her appetite is good. Her weight has been stable.  I have reviewed the past medical history, past  surgical history, social history and family history with the patient and they are unchanged from previous note.  ALLERGIES:  has no known allergies.  MEDICATIONS:  Current Outpatient Medications  Medication Sig Dispense Refill   albuterol (VENTOLIN HFA) 108 (90 Base) MCG/ACT inhaler Inhale 2 puffs into the lungs every 4 (four) hours as needed for wheezing or shortness of breath. 8 g 3   ALPRAZolam (XANAX) 0.5 MG tablet  TAKE 1 TABLET(0.5 MG) BY MOUTH TWICE DAILY 180 tablet 0   aspirin 81 MG chewable tablet Chew 1 tablet (81 mg total) by mouth daily. 90 tablet 1   atorvastatin (LIPITOR) 10 MG tablet TAKE 1 TABLET BY MOUTH DAILY AT 6 PM 90 tablet 3   buPROPion (WELLBUTRIN SR) 150 MG 12 hr tablet TAKE 1 TABLET BY MOUTH TWICE DAILY 180 tablet 0   Cholecalciferol (EQL VITAMIN D3) 1000 units tablet Take 1 tablet (1,000 Units total) by mouth daily. 90 tablet 1   citalopram (CELEXA) 40 MG tablet TAKE 1 TABLET(40 MG) BY MOUTH DAILY 90 tablet 3   gabapentin (NEURONTIN) 100 MG capsule TAKE 1 CAPSULE(100 MG) BY MOUTH THREE TIMES DAILY 90 capsule 3   GLYXAMBI 25-5 MG TABS Take 1 tablet by mouth daily. Take 1 tablet by mouth daily. 90 tablet 3   metFORMIN (GLUCOPHAGE) 500 MG tablet TAKE 2 TABLETS TWICE DAILY WITH FOOD 360 tablet 0   mupirocin ointment (BACTROBAN) 2 % On leg wound w/dressing change qd or bid 60 g 1   ondansetron (ZOFRAN ODT) 4 MG disintegrating tablet Take 1 tablet (4 mg total) by mouth every 8 (eight) hours as needed for nausea or vomiting. 20 tablet 0   pantoprazole (PROTONIX) 40 MG tablet Take 1 tablet (40 mg total) by mouth 2 (two) times daily. 180 tablet 3   phentermine (ADIPEX-P) 37.5 MG tablet Take 1 tablet (37.5 mg total) by mouth daily before breakfast. 30 tablet 2   potassium chloride SA (KLOR-CON M) 20 MEQ tablet Take 1 tablet (20 mEq total) by mouth daily. 30 tablet 5   repaglinide (PRANDIN) 2 MG tablet TAKE 1 TABLET BY MOUTH THREE TIMES DAILY BEFORE MEALS 270 tablet 3   Semaglutide,0.25 or 0.5MG /DOS, (OZEMPIC, 0.25 OR 0.5 MG/DOSE,) 2 MG/3ML SOPN Use 0.25 mg weekly sq for 1 month, then 0.5 mg sq weekly 9 mL 1   No current facility-administered medications for this visit.    REVIEW OF SYSTEMS:   Constitutional: Denies fevers, chills or abnormal weight loss Eyes: Denies blurriness of vision Ears, nose, mouth, throat, and face: Denies mucositis or sore throat Respiratory: Denies cough, dyspnea or  wheezes Cardiovascular: Denies palpitation, chest discomfort or lower extremity swelling Gastrointestinal:  Denies nausea, heartburn or change in bowel habits Skin: Denies abnormal skin rashes Lymphatics: Denies new lymphadenopathy or easy bruising Neurological:Denies numbness, tingling or new weaknesses Behavioral/Psych: Mood is stable, no new changes  All other systems were reviewed with the patient and are negative.   VITALS:   Today's Vitals   01/19/24 1322  BP: 133/74  Pulse: 100  Resp: 17  Temp: (!) 97.2 F (36.2 C)  SpO2: 96%  Weight: 205 lb (93 kg)  PainSc: 0-No pain   Body mass index is 31.17 kg/m.   Wt Readings from Last 3 Encounters:  01/19/24 205 lb (93 kg)  12/02/23 204 lb 12.8 oz (92.9 kg)  11/23/23 200 lb (90.7 kg)    Body mass index is 31.17 kg/m.  Performance status (ECOG): 1 - Symptomatic but completely ambulatory  PHYSICAL EXAM:   GENERAL:alert, no distress and comfortable SKIN: skin color, texture, turgor are normal, no rashes or significant lesions EYES: normal, Conjunctiva are pink and non-injected, sclera clear OROPHARYNX:no exudate, no erythema and lips, buccal mucosa, and tongue normal  NECK: supple, thyroid normal size, non-tender, without nodularity LYMPH:  no palpable lymphadenopathy in the cervical, axillary or inguinal LUNGS: clear to auscultation and percussion with normal breathing effort HEART: regular rate & rhythm and no murmurs and no lower extremity edema ABDOMEN:abdomen soft, non-tender and normal bowel sounds Musculoskeletal:no cyanosis of digits and no clubbing  NEURO: alert & oriented x 3 with fluent speech, no focal motor/sensory deficits  LABORATORY DATA:  I have reviewed the data as listed    Component Value Date/Time   NA 137 10/20/2023 1236   NA 135 (L) 12/06/2015 1316   K 3.3 (L) 10/20/2023 1236   K 3.7 12/06/2015 1316   CL 103 10/20/2023 1236   CO2 26 10/20/2023 1236   CO2 21 (L) 12/06/2015 1316   GLUCOSE 128  (H) 10/20/2023 1236   GLUCOSE 181 (H) 12/06/2015 1316   GLUCOSE 131 (H) 12/08/2006 1022   BUN 10 10/20/2023 1236   BUN 7.2 12/06/2015 1316   CREATININE 0.58 10/20/2023 1236   CREATININE 0.67 08/20/2023 1528   CREATININE 0.7 12/06/2015 1316   CALCIUM 9.3 10/20/2023 1236   CALCIUM 9.1 12/06/2015 1316   PROT 6.9 08/20/2023 1528   PROT 7.3 12/06/2015 1316   ALBUMIN 4.2 08/20/2023 1528   ALBUMIN 3.8 12/06/2015 1316   AST 23 08/20/2023 1528   AST 17 12/06/2015 1316   ALT 23 08/20/2023 1528   ALT 21 12/06/2015 1316   ALKPHOS 87 08/20/2023 1528   ALKPHOS 99 12/06/2015 1316   BILITOT 0.6 08/20/2023 1528   BILITOT 0.63 12/06/2015 1316   GFRNONAA >60 08/20/2023 1528   GFRAA 177 04/10/2008 1420     Lab Results  Component Value Date   WBC 9.8 01/19/2024   NEUTROABS 6.5 01/19/2024   HGB 13.3 01/19/2024   HCT 40.3 01/19/2024   MCV 88.0 01/19/2024   PLT 195 01/19/2024

## 2024-01-19 NOTE — Assessment & Plan Note (Signed)
--   Etiology unknown as patient has no signs of bleeding including menstrual bleeding, hematochezia or melena. -- She has undergone endoscopic evaluation with EGD on 09/25/2020 and most recently a colonoscopy on 03/17/2023.  No evidence of bleeding was identified for either of those assessments.  We will reach out to GI to determine if a capsule endoscopy is warranted. -- Patient reports that her iron levels did not improve with p.o. iron therapy and thus, progressed to IV iron treatments. --today, 10/20/2023, she returns after receiving 1 gm Feraheme. Blood count has improved with Hgb and Hct within normal limits. The patient reports improvement in symptoms.  -- She was to have capsule endoscopy on 12/21/2023, but did get sick.  Had to cancel that appointment.  She would like to hold off on this study for right now.  Her blood counts have remained within normal limits over last few months. -Continue monthly blood counts with iron studies.  Treat with IV iron as indicated.   -Labs with follow-up in 3 months.

## 2024-01-27 ENCOUNTER — Other Ambulatory Visit: Payer: Self-pay | Admitting: Internal Medicine

## 2024-01-28 ENCOUNTER — Other Ambulatory Visit: Payer: Self-pay | Admitting: Internal Medicine

## 2024-01-30 ENCOUNTER — Other Ambulatory Visit: Payer: Self-pay | Admitting: Internal Medicine

## 2024-02-05 ENCOUNTER — Other Ambulatory Visit (HOSPITAL_COMMUNITY): Payer: Self-pay

## 2024-02-17 ENCOUNTER — Inpatient Hospital Stay: Payer: BC Managed Care – PPO | Attending: Physician Assistant

## 2024-02-17 DIAGNOSIS — D509 Iron deficiency anemia, unspecified: Secondary | ICD-10-CM | POA: Insufficient documentation

## 2024-02-17 LAB — IRON AND IRON BINDING CAPACITY (CC-WL,HP ONLY)
Iron: 56 ug/dL (ref 28–170)
Saturation Ratios: 17 % (ref 10.4–31.8)
TIBC: 333 ug/dL (ref 250–450)
UIBC: 277 ug/dL (ref 148–442)

## 2024-02-17 LAB — CBC WITH DIFFERENTIAL (CANCER CENTER ONLY)
Abs Immature Granulocytes: 0.06 10*3/uL (ref 0.00–0.07)
Basophils Absolute: 0.1 10*3/uL (ref 0.0–0.1)
Basophils Relative: 1 %
Eosinophils Absolute: 0.3 10*3/uL (ref 0.0–0.5)
Eosinophils Relative: 4 %
HCT: 39.7 % (ref 36.0–46.0)
Hemoglobin: 12.9 g/dL (ref 12.0–15.0)
Immature Granulocytes: 1 %
Lymphocytes Relative: 26 %
Lymphs Abs: 2 10*3/uL (ref 0.7–4.0)
MCH: 29.2 pg (ref 26.0–34.0)
MCHC: 32.5 g/dL (ref 30.0–36.0)
MCV: 89.8 fL (ref 80.0–100.0)
Monocytes Absolute: 0.4 10*3/uL (ref 0.1–1.0)
Monocytes Relative: 5 %
Neutro Abs: 4.8 10*3/uL (ref 1.7–7.7)
Neutrophils Relative %: 63 %
Platelet Count: 162 10*3/uL (ref 150–400)
RBC: 4.42 MIL/uL (ref 3.87–5.11)
RDW: 13.6 % (ref 11.5–15.5)
WBC Count: 7.6 10*3/uL (ref 4.0–10.5)
nRBC: 0 % (ref 0.0–0.2)

## 2024-02-18 LAB — FERRITIN: Ferritin: 18 ng/mL (ref 11–307)

## 2024-02-22 ENCOUNTER — Encounter: Payer: Self-pay | Admitting: Nurse Practitioner

## 2024-03-02 ENCOUNTER — Other Ambulatory Visit: Payer: Self-pay | Admitting: Internal Medicine

## 2024-03-02 DIAGNOSIS — D508 Other iron deficiency anemias: Secondary | ICD-10-CM

## 2024-03-16 ENCOUNTER — Inpatient Hospital Stay: Payer: BC Managed Care – PPO

## 2024-03-17 ENCOUNTER — Inpatient Hospital Stay: Attending: Physician Assistant

## 2024-03-17 DIAGNOSIS — D509 Iron deficiency anemia, unspecified: Secondary | ICD-10-CM | POA: Insufficient documentation

## 2024-03-17 LAB — CBC WITH DIFFERENTIAL (CANCER CENTER ONLY)
Abs Immature Granulocytes: 0.09 K/uL — ABNORMAL HIGH (ref 0.00–0.07)
Basophils Absolute: 0.1 K/uL (ref 0.0–0.1)
Basophils Relative: 1 %
Eosinophils Absolute: 0.2 K/uL (ref 0.0–0.5)
Eosinophils Relative: 2 %
HCT: 39.2 % (ref 36.0–46.0)
Hemoglobin: 12.9 g/dL (ref 12.0–15.0)
Immature Granulocytes: 1 %
Lymphocytes Relative: 25 %
Lymphs Abs: 2.3 K/uL (ref 0.7–4.0)
MCH: 29.4 pg (ref 26.0–34.0)
MCHC: 32.9 g/dL (ref 30.0–36.0)
MCV: 89.3 fL (ref 80.0–100.0)
Monocytes Absolute: 0.4 K/uL (ref 0.1–1.0)
Monocytes Relative: 4 %
Neutro Abs: 6.1 K/uL (ref 1.7–7.7)
Neutrophils Relative %: 67 %
Platelet Count: 200 K/uL (ref 150–400)
RBC: 4.39 MIL/uL (ref 3.87–5.11)
RDW: 13.8 % (ref 11.5–15.5)
WBC Count: 9.2 K/uL (ref 4.0–10.5)
nRBC: 0 % (ref 0.0–0.2)

## 2024-03-17 LAB — IRON AND IRON BINDING CAPACITY (CC-WL,HP ONLY)
Iron: 51 ug/dL (ref 28–170)
Saturation Ratios: 15 % (ref 10.4–31.8)
TIBC: 346 ug/dL (ref 250–450)
UIBC: 295 ug/dL

## 2024-03-18 ENCOUNTER — Encounter: Payer: Self-pay | Admitting: Nurse Practitioner

## 2024-03-18 LAB — FERRITIN: Ferritin: 24 ng/mL (ref 11–307)

## 2024-04-13 ENCOUNTER — Inpatient Hospital Stay: Payer: BC Managed Care – PPO | Attending: Physician Assistant

## 2024-04-28 ENCOUNTER — Other Ambulatory Visit: Payer: Self-pay | Admitting: Internal Medicine

## 2024-05-05 ENCOUNTER — Encounter: Payer: Self-pay | Admitting: Internal Medicine

## 2024-05-11 ENCOUNTER — Inpatient Hospital Stay: Payer: BC Managed Care – PPO | Attending: Physician Assistant

## 2024-05-11 DIAGNOSIS — D509 Iron deficiency anemia, unspecified: Secondary | ICD-10-CM | POA: Insufficient documentation

## 2024-05-11 LAB — CBC WITH DIFFERENTIAL (CANCER CENTER ONLY)
Abs Immature Granulocytes: 0.09 10*3/uL — ABNORMAL HIGH (ref 0.00–0.07)
Basophils Absolute: 0.1 10*3/uL (ref 0.0–0.1)
Basophils Relative: 1 %
Eosinophils Absolute: 0.2 10*3/uL (ref 0.0–0.5)
Eosinophils Relative: 3 %
HCT: 39 % (ref 36.0–46.0)
Hemoglobin: 13.4 g/dL (ref 12.0–15.0)
Immature Granulocytes: 1 %
Lymphocytes Relative: 21 %
Lymphs Abs: 1.9 10*3/uL (ref 0.7–4.0)
MCH: 29.8 pg (ref 26.0–34.0)
MCHC: 34.4 g/dL (ref 30.0–36.0)
MCV: 86.7 fL (ref 80.0–100.0)
Monocytes Absolute: 0.5 10*3/uL (ref 0.1–1.0)
Monocytes Relative: 6 %
Neutro Abs: 6 10*3/uL (ref 1.7–7.7)
Neutrophils Relative %: 68 %
Platelet Count: 214 10*3/uL (ref 150–400)
RBC: 4.5 MIL/uL (ref 3.87–5.11)
RDW: 14.4 % (ref 11.5–15.5)
WBC Count: 8.8 10*3/uL (ref 4.0–10.5)
nRBC: 0 % (ref 0.0–0.2)

## 2024-05-11 LAB — IRON AND IRON BINDING CAPACITY (CC-WL,HP ONLY)
Iron: 54 ug/dL (ref 28–170)
Saturation Ratios: 15 % (ref 10.4–31.8)
TIBC: 351 ug/dL (ref 250–450)
UIBC: 297 ug/dL (ref 148–442)

## 2024-05-12 LAB — FERRITIN: Ferritin: 28 ng/mL (ref 11–307)

## 2024-05-14 ENCOUNTER — Other Ambulatory Visit: Payer: Self-pay

## 2024-05-14 ENCOUNTER — Encounter: Payer: Self-pay | Admitting: Oncology

## 2024-05-14 ENCOUNTER — Inpatient Hospital Stay (HOSPITAL_BASED_OUTPATIENT_CLINIC_OR_DEPARTMENT_OTHER)
Admission: EM | Admit: 2024-05-14 | Discharge: 2024-05-15 | DRG: 309 | Disposition: A | Discharge status: Home / Self Care | Attending: Internal Medicine | Admitting: Internal Medicine

## 2024-05-14 ENCOUNTER — Emergency Department (HOSPITAL_BASED_OUTPATIENT_CLINIC_OR_DEPARTMENT_OTHER)

## 2024-05-14 ENCOUNTER — Encounter (HOSPITAL_BASED_OUTPATIENT_CLINIC_OR_DEPARTMENT_OTHER): Payer: Self-pay

## 2024-05-14 DIAGNOSIS — E871 Hypo-osmolality and hyponatremia: Secondary | ICD-10-CM | POA: Diagnosis present

## 2024-05-14 DIAGNOSIS — I429 Cardiomyopathy, unspecified: Secondary | ICD-10-CM | POA: Diagnosis present

## 2024-05-14 DIAGNOSIS — I110 Hypertensive heart disease with heart failure: Secondary | ICD-10-CM | POA: Diagnosis present

## 2024-05-14 DIAGNOSIS — Z1152 Encounter for screening for COVID-19: Secondary | ICD-10-CM

## 2024-05-14 DIAGNOSIS — Z8619 Personal history of other infectious and parasitic diseases: Secondary | ICD-10-CM

## 2024-05-14 DIAGNOSIS — F411 Generalized anxiety disorder: Secondary | ICD-10-CM | POA: Diagnosis present

## 2024-05-14 DIAGNOSIS — E785 Hyperlipidemia, unspecified: Secondary | ICD-10-CM | POA: Diagnosis present

## 2024-05-14 DIAGNOSIS — E878 Other disorders of electrolyte and fluid balance, not elsewhere classified: Secondary | ICD-10-CM | POA: Diagnosis present

## 2024-05-14 DIAGNOSIS — I2510 Atherosclerotic heart disease of native coronary artery without angina pectoris: Secondary | ICD-10-CM | POA: Diagnosis present

## 2024-05-14 DIAGNOSIS — F17210 Nicotine dependence, cigarettes, uncomplicated: Secondary | ICD-10-CM | POA: Diagnosis present

## 2024-05-14 DIAGNOSIS — Z7982 Long term (current) use of aspirin: Secondary | ICD-10-CM

## 2024-05-14 DIAGNOSIS — I5022 Chronic systolic (congestive) heart failure: Secondary | ICD-10-CM | POA: Diagnosis present

## 2024-05-14 DIAGNOSIS — I471 Supraventricular tachycardia, unspecified: Secondary | ICD-10-CM | POA: Diagnosis not present

## 2024-05-14 DIAGNOSIS — R002 Palpitations: Secondary | ICD-10-CM | POA: Diagnosis not present

## 2024-05-14 DIAGNOSIS — F419 Anxiety disorder, unspecified: Secondary | ICD-10-CM | POA: Diagnosis present

## 2024-05-14 DIAGNOSIS — E1110 Type 2 diabetes mellitus with ketoacidosis without coma: Secondary | ICD-10-CM | POA: Diagnosis present

## 2024-05-14 DIAGNOSIS — J45909 Unspecified asthma, uncomplicated: Secondary | ICD-10-CM | POA: Insufficient documentation

## 2024-05-14 DIAGNOSIS — Z8249 Family history of ischemic heart disease and other diseases of the circulatory system: Secondary | ICD-10-CM

## 2024-05-14 DIAGNOSIS — R739 Hyperglycemia, unspecified: Secondary | ICD-10-CM

## 2024-05-14 DIAGNOSIS — K219 Gastro-esophageal reflux disease without esophagitis: Secondary | ICD-10-CM | POA: Diagnosis present

## 2024-05-14 DIAGNOSIS — Z79899 Other long term (current) drug therapy: Secondary | ICD-10-CM

## 2024-05-14 DIAGNOSIS — Z7984 Long term (current) use of oral hypoglycemic drugs: Secondary | ICD-10-CM

## 2024-05-14 DIAGNOSIS — Z9049 Acquired absence of other specified parts of digestive tract: Secondary | ICD-10-CM

## 2024-05-14 DIAGNOSIS — I4510 Unspecified right bundle-branch block: Secondary | ICD-10-CM | POA: Diagnosis present

## 2024-05-14 DIAGNOSIS — F32A Depression, unspecified: Secondary | ICD-10-CM | POA: Diagnosis present

## 2024-05-14 LAB — COMPREHENSIVE METABOLIC PANEL WITH GFR
ALT: 32 U/L (ref 0–44)
AST: 32 U/L (ref 15–41)
Albumin: 4.4 g/dL (ref 3.5–5.0)
Alkaline Phosphatase: 115 U/L (ref 38–126)
Anion gap: 17 — ABNORMAL HIGH (ref 5–15)
BUN: 10 mg/dL (ref 6–20)
CO2: 19 mmol/L — ABNORMAL LOW (ref 22–32)
Calcium: 10 mg/dL (ref 8.9–10.3)
Chloride: 96 mmol/L — ABNORMAL LOW (ref 98–111)
Creatinine, Ser: 0.75 mg/dL (ref 0.44–1.00)
GFR, Estimated: >60 mL/min (ref >60)
Glucose, Bld: 578 mg/dL (ref 70–99)
Potassium: 4.2 mmol/L (ref 3.5–5.1)
Sodium: 131 mmol/L — ABNORMAL LOW (ref 135–145)
Total Bilirubin: 0.5 mg/dL (ref 0.0–1.2)
Total Protein: 7.6 g/dL (ref 6.5–8.1)

## 2024-05-14 LAB — I-STAT VENOUS BLOOD GAS, ED
Acid-base deficit: 3 mmol/L — ABNORMAL HIGH (ref 0.0–2.0)
Bicarbonate: 22.2 mmol/L (ref 20.0–28.0)
Calcium, Ion: 1.26 mmol/L (ref 1.15–1.40)
HCT: 38 % (ref 36.0–46.0)
Hemoglobin: 12.9 g/dL (ref 12.0–15.0)
O2 Saturation: 76 %
Patient temperature: 98.6
Potassium: 4.1 mmol/L (ref 3.5–5.1)
Sodium: 135 mmol/L (ref 135–145)
TCO2: 23 mmol/L (ref 22–32)
pCO2, Ven: 40.9 mmHg — ABNORMAL LOW (ref 44–60)
pH, Ven: 7.342 (ref 7.25–7.43)
pO2, Ven: 43 mmHg (ref 32–45)

## 2024-05-14 LAB — URINALYSIS, ROUTINE W REFLEX MICROSCOPIC
Bacteria, UA: NONE SEEN
Bilirubin Urine: NEGATIVE
Glucose, UA: >1000 mg/dL — AB
Hgb urine dipstick: NEGATIVE
Ketones, ur: NEGATIVE mg/dL
Leukocytes,Ua: NEGATIVE
Nitrite: NEGATIVE
Protein, ur: NEGATIVE mg/dL
Specific Gravity, Urine: 1.037 — ABNORMAL HIGH (ref 1.005–1.030)
pH: 5.5 (ref 5.0–8.0)

## 2024-05-14 LAB — CBC WITH DIFFERENTIAL/PLATELET
Abs Immature Granulocytes: 0.11 10*3/uL — ABNORMAL HIGH (ref 0.00–0.07)
Basophils Absolute: 0.1 10*3/uL (ref 0.0–0.1)
Basophils Relative: 1 %
Eosinophils Absolute: 0.3 10*3/uL (ref 0.0–0.5)
Eosinophils Relative: 2 %
HCT: 42.5 % (ref 36.0–46.0)
Hemoglobin: 13.8 g/dL (ref 12.0–15.0)
Immature Granulocytes: 1 %
Lymphocytes Relative: 18 %
Lymphs Abs: 2.2 10*3/uL (ref 0.7–4.0)
MCH: 28.2 pg (ref 26.0–34.0)
MCHC: 32.5 g/dL (ref 30.0–36.0)
MCV: 86.7 fL (ref 80.0–100.0)
Monocytes Absolute: 0.7 10*3/uL (ref 0.1–1.0)
Monocytes Relative: 6 %
Neutro Abs: 8.7 10*3/uL — ABNORMAL HIGH (ref 1.7–7.7)
Neutrophils Relative %: 72 %
Platelets: 234 10*3/uL (ref 150–400)
RBC: 4.9 MIL/uL (ref 3.87–5.11)
RDW: 14.5 % (ref 11.5–15.5)
WBC: 12 10*3/uL — ABNORMAL HIGH (ref 4.0–10.5)
nRBC: 0 % (ref 0.0–0.2)

## 2024-05-14 LAB — RESP PANEL BY RT-PCR (RSV, FLU A&B, COVID)  RVPGX2
Influenza A by PCR: NEGATIVE
Influenza B by PCR: NEGATIVE
Resp Syncytial Virus by PCR: NEGATIVE
SARS Coronavirus 2 by RT PCR: NEGATIVE

## 2024-05-14 LAB — OSMOLALITY: Osmolality: 317 mosm/kg — ABNORMAL HIGH (ref 275–295)

## 2024-05-14 LAB — MAGNESIUM: Magnesium: 2 mg/dL (ref 1.7–2.4)

## 2024-05-14 LAB — CBG MONITORING, ED: Glucose-Capillary: 428 mg/dL — ABNORMAL HIGH (ref 70–99)

## 2024-05-14 LAB — BETA-HYDROXYBUTYRIC ACID: Beta-Hydroxybutyric Acid: 0.18 mmol/L (ref 0.05–0.27)

## 2024-05-14 MED ORDER — ADENOSINE 6 MG/2ML IV SOLN
INTRAVENOUS | Status: AC
Start: 2024-05-14 — End: 2024-05-14
  Filled 2024-05-14: qty 6

## 2024-05-14 MED ORDER — METOPROLOL TARTRATE 5 MG/5ML IV SOLN
5.0000 mg | Freq: Once | INTRAVENOUS | Status: AC
  Administered 2024-05-14: 5 mg via INTRAVENOUS
  Filled 2024-05-14: qty 5

## 2024-05-14 MED ORDER — LACTATED RINGERS IV BOLUS
20.0000 mL/kg | Freq: Once | INTRAVENOUS | Status: AC
  Administered 2024-05-14: 1860 mL via INTRAVENOUS

## 2024-05-14 MED ORDER — DEXTROSE 50 % IV SOLN: 0.0000 mL | INTRAVENOUS | Status: DC | PRN

## 2024-05-14 MED ORDER — LACTATED RINGERS IV SOLN: INTRAVENOUS | Status: DC

## 2024-05-14 MED ORDER — MAGNESIUM SULFATE 2 GM/50ML IV SOLN
2.0000 g | Freq: Once | INTRAVENOUS | Status: AC
  Administered 2024-05-14: 2 g via INTRAVENOUS
  Filled 2024-05-14: qty 50

## 2024-05-14 MED ORDER — POTASSIUM CHLORIDE 10 MEQ/100ML IV SOLN
10.0000 meq | INTRAVENOUS | Status: AC
  Filled 2024-05-14: qty 100

## 2024-05-14 MED ORDER — DEXTROSE IN LACTATED RINGERS 5 % IV SOLN: INTRAVENOUS | Status: DC

## 2024-05-14 MED ORDER — INSULIN REGULAR(HUMAN) IN NACL 100-0.9 UT/100ML-% IV SOLN
INTRAVENOUS | Status: DC
  Filled 2024-05-14: qty 100

## 2024-05-14 NOTE — ED Triage Notes (Signed)
 Pt to rm from [facility] via [facility] for c/o heart palpitations. Placed on heart monitor. EKG obtained, pt in SVT HR in 170s. PIV x2 inserted. EDMD at bedside. Placed pt on pads for additional monitoring. 12mg  Adenosine given at 2012 per order. Pt HR improved to 120. Pt A/Ox4, RR equal and unlabored. C/o HA, nausea, and nonradiating CP. See new orders.

## 2024-05-14 NOTE — ED Triage Notes (Signed)
 Pt states that she began feeling like her heart was racing with chest pressure and SOB at approx 1700. Her phone app showed that her HR was in 150's. Pt's HR is now 172. Pt taken straight to room and provider made aware

## 2024-05-14 NOTE — ED Notes (Signed)
 Pt connected to Zoll pads and monitor and Dr Inga Manges at bedside and Adenosine administered/.

## 2024-05-14 NOTE — ED Provider Notes (Signed)
 [place] EMERGENCY DEPARTMENT AT [facility] Provider Note   CSN: [id number] Arrival date & time: 05/14/24  1945     History  Chief Complaint  Patient presents with   Palpitations    [name] [name] is a 58 y.o. female.  58 yo F with a chief complaint of heart palpitations.  Is been going on for about 3-1/2 hours today.  She reluctantly then came to the emergency department for evaluation.  She had something similar happen earlier in the week.  Called her family doctor was scheduled appointment with her in the clinic.  This never happened before this.  She was taking some weight loss medication but stopped about 3 days ago.  She drinks quite a bit of caffeinated beverages but denies any significant change.  Denies any medication for ADHD.  Denies any new over-the-counter supplements.  She has been coughing quite a bit for the past week or so.  Feels like maybe she has a sinus infection as well.   Palpitations      Home Medications Prior to Admission medications   Medication Sig Start Date End Date Taking? Authorizing Provider  albuterol  (VENTOLIN  HFA) 108 (90 Base) MCG/ACT inhaler Inhale 2 puffs into the lungs every 4 (four) hours as needed for wheezing or shortness of breath. 01/06/23   [name], Oakley Bellman, MD  ALPRAZolam  (XANAX ) 0.5 MG tablet TAKE 1 TABLET(0.5 MG) BY MOUTH TWICE DAILY 01/06/23   [name], Aleksei V, MD  aspirin  81 MG chewable tablet Chew 1 tablet (81 mg total) by mouth daily. 05/19/17   [name], Aleksei V, MD  atorvastatin  (LIPITOR) 10 MG tablet TAKE 1 TABLET BY MOUTH DAILY AT 6 PM 03/10/23   [name], Aleksei V, MD  buPROPion  (WELLBUTRIN  SR) 150 MG 12 hr tablet TAKE 1 TABLET BY MOUTH TWICE DAILY 03/02/24   [name], Aleksei V, MD  Cholecalciferol  (EQL VITAMIN D3) 1000 units tablet Take 1 tablet (1,000 Units total) by mouth daily. 05/19/17   [name], Oakley Bellman, MD  citalopram  (CELEXA ) 40 MG tablet TAKE 1 TABLET(40 MG) BY MOUTH DAILY 09/15/23    [name], Aleksei V, MD  gabapentin  (NEURONTIN ) 100 MG capsule TAKE 1 CAPSULE(100 MG) BY MOUTH THREE TIMES DAILY 01/29/24   [name], Aleksei V, MD  GLYXAMBI  25-5 MG TABS TAKE 1 TABLET BY MOUTH DAILY 03/02/24   [name], Aleksei V, MD  metFORMIN  (GLUCOPHAGE ) 500 MG tablet TAKE 2 TABLETS TWICE DAILY WITH FOOD 04/28/24   [name], Aleksei V, MD  mupirocin  ointment (BACTROBAN ) 2 % On leg wound w/dressing change qd or bid 05/08/23   [name], Aleksei V, MD  ondansetron  (ZOFRAN  ODT) 4 MG disintegrating tablet Take 1 tablet (4 mg total) by mouth every 8 (eight) hours as needed for nausea or vomiting. 01/06/23   [name], Aleksei V, MD  pantoprazole  (PROTONIX ) 40 MG tablet TAKE 1 TABLET(40 MG) BY MOUTH TWICE DAILY 01/27/24   [name], Aleksei V, MD  phentermine  (ADIPEX-P ) 37.5 MG tablet Take 1 tablet (37.5 mg total) by mouth daily before breakfast. 12/02/23   [name], Aleksei V, MD  potassium chloride  SA (KLOR-CON  M) 20 MEQ tablet Take 1 tablet (20 mEq total) by mouth daily. 10/25/23   [name], Aleksei V, MD  repaglinide  (PRANDIN ) 2 MG tablet TAKE 1 TABLET BY MOUTH THREE TIMES DAILY BEFORE MEALS 02/01/24   [name], Oakley Bellman, MD      Allergies    Patient has no known allergies.    Review of Systems   Review of Systems  Cardiovascular:  Positive  for palpitations.    Physical Exam Updated Vital Signs BP (!) 145/75   Pulse 95   Temp 97.8 F (36.6 C) (Oral)   Resp 20   Ht 5\' 8"  (1.727 m)   Wt 93 kg   LMP 03/02/2017   SpO2 95%   BMI 31.17 kg/m  Physical Exam Vitals and nursing note reviewed.  Constitutional:      General: She is not in acute distress.    Appearance: She is well-developed. She is not diaphoretic.  HENT:     Head: Normocephalic and atraumatic.  Eyes:     Pupils: Pupils are equal, round, and reactive to light.  Cardiovascular:     Rate and Rhythm: Regular rhythm. Tachycardia present.     Heart sounds: No murmur heard.    No friction rub. No gallop.  Pulmonary:      Effort: Pulmonary effort is normal.     Breath sounds: No wheezing or rales.  Abdominal:     General: There is no distension.     Palpations: Abdomen is soft.     Tenderness: There is no abdominal tenderness.  Musculoskeletal:        General: No tenderness.     Cervical back: Normal range of motion and neck supple.  Skin:    General: Skin is warm and dry.  Neurological:     Mental Status: She is alert and oriented to person, place, and time.  Psychiatric:        Behavior: Behavior normal.     ED Results / Procedures / Treatments   Labs (all labs ordered are listed, but only abnormal results are displayed) Labs Reviewed  CBC WITH DIFFERENTIAL/PLATELET - Abnormal; Notable for the following components:      Result Value   WBC 12.0 (*)    Neutro Abs 8.7 (*)    Abs Immature Granulocytes 0.11 (*)    All other components within normal limits  COMPREHENSIVE METABOLIC PANEL WITH GFR - Abnormal; Notable for the following components:   Sodium 131 (*)    Chloride 96 (*)    CO2 19 (*)    Glucose, Bld 578 (*)    Anion gap 17 (*)    All other components within normal limits  URINALYSIS, ROUTINE W REFLEX MICROSCOPIC - Abnormal; Notable for the following components:   Color, Urine COLORLESS (*)    Specific Gravity, Urine 1.037 (*)    Glucose, UA >1,000 (*)    All other components within normal limits  CBG MONITORING, ED - Abnormal; Notable for the following components:   Glucose-Capillary 428 (*)    All other components within normal limits  I-STAT VENOUS BLOOD GAS, ED - Abnormal; Notable for the following components:   pCO2, Ven 40.9 (*)    Acid-base deficit 3.0 (*)    All other components within normal limits  RESP PANEL BY RT-PCR (RSV, FLU A&B, COVID)  RVPGX2  MAGNESIUM  BASIC METABOLIC PANEL WITH GFR  BASIC METABOLIC PANEL WITH GFR  BASIC METABOLIC PANEL WITH GFR  BETA-HYDROXYBUTYRIC ACID  BETA-HYDROXYBUTYRIC ACID  BETA-HYDROXYBUTYRIC ACID  BETA-HYDROXYBUTYRIC ACID   BETA-HYDROXYBUTYRIC ACID  OSMOLALITY    EKG EKG Interpretation Date/Time:  Saturday May 14 2024 20:14:01 EDT Ventricular Rate:  118 PR Interval:  155 QRS Duration:  124 QT Interval:  348 QTC Calculation: 488 R Axis:   -77  Text Interpretation: Sinus tachycardia Right atrial enlargement Right bundle branch block LVH with IVCD and secondary repol abnrm Inferior infarct, old Baseline wander in  lead(s) III aVF Since last tracing rate slower Otherwise no significant change Confirmed by Albertus Hughs [id number]) on 05/14/2024 8:31:03 PM  Radiology DG Chest Port 1 View Result Date: 05/14/2024 CLINICAL DATA:  Cough, tachycardia, chest pressure EXAM: PORTABLE CHEST 1 VIEW COMPARISON:  01/03/2023 FINDINGS: Single frontal view of the chest demonstrates an unremarkable cardiac silhouette. No acute airspace disease, effusion, or pneumothorax. No acute bony abnormalities. IMPRESSION: 1. No acute intrathoracic process. Electronically Signed   By: Bobbye Burrow M.D.   On: 05/14/2024 20:42    Procedures .Cardioversion  Date/Time: 05/14/2024 8:30 PM  Performed by: Albertus Hughs, DO Authorized by: Albertus Hughs, DO   Consent:    Consent obtained:  Verbal   Consent given by:  Patient   Risks discussed:  Cutaneous burn, death, induced arrhythmia and pain   Alternatives discussed:  No treatment Pre-procedure details:    Rhythm:  Supraventricular tachycardia   Electrode placement:  Anterior-posterior Patient sedated: No Post-procedure details:    Patient status:  Awake   Patient tolerance of procedure:  Tolerated well, no immediate complications Comments:     Patient given a adenosine with improvement of her rates.  Now in sinus tachycardia.  .Critical Care  Performed by: Albertus Hughs, DO Authorized by: Albertus Hughs, DO   Critical care provider statement:    Critical care time (minutes):  35   Critical care time was exclusive of:  Separately billable procedures and treating other patients   Critical care  was time spent personally by me on the following activities:  Development of treatment plan with patient or surrogate, discussions with consultants, evaluation of patient's response to treatment, examination of patient, ordering and review of laboratory studies, ordering and review of radiographic studies, ordering and performing treatments and interventions, pulse oximetry, re-evaluation of patient's condition and review of old charts   Care discussed with: admitting provider       Medications Ordered in ED Medications  insulin  regular, human (MYXREDLIN) 100 units/ 100 mL infusion (has no administration in time range)  lactated ringers  infusion (has no administration in time range)  dextrose 5 % in lactated ringers  infusion (has no administration in time range)  dextrose 50 % solution 0-50 mL (has no administration in time range)  potassium chloride  10 mEq in 100 mL IVPB (has no administration in time range)  adenosine (ADENOCARD) 6 MG/2ML injection ( Intravenous Given 05/14/24 2012)  magnesium sulfate IVPB 2 g 50 mL (0 g Intravenous Stopped 05/14/24 2215)  metoprolol tartrate (LOPRESSOR) injection 5 mg (5 mg Intravenous Given 05/14/24 2035)  lactated ringers  bolus 1,860 mL (1,860 mLs Intravenous New Bag/Given 05/14/24 2215)    ED Course/ Medical Decision Making/ A&P                                 Medical Decision Making Amount and/or Complexity of Data Reviewed Labs: ordered. Radiology: ordered.  Risk Prescription drug management.   58 yo F with a chief complaints of palpitations.  This has been going on for about 3 and half hours.  She had a similar episode and had called her doctor and they scheduled an appointment.  At that time and had resolved on its own.  She has been suffering from what sounds like an upper respiratory illness.  Cough congestion going on for about a week or so.  Is complaining of sinus pressure and pain.  No reported fevers.  She also has been  taking a weight  loss drug.  Stopped about 3 days ago.  Patient likely in SVT rates in the 170s.  Somewhat difficult to interpret with history of a right bundle branch block.  Patient was given adenosine and and now is in sinus tachycardia with rates in the 110-20 range.  Will give a dose of metoprolol.  Lab work.  IV fluids reassess.  Patient's heart rates have improved but her heart rate continues to be over 100.  Her blood sugar is also almost 600.  Will start him on an insulin  infusion.  She does have a metabolic acidosis with anion gap.  Will discuss with medicine for admission.  The patients results and plan were reviewed and discussed.   Any x-rays performed were independently reviewed by myself.   Differential diagnosis were considered with the presenting HPI.  Medications  insulin  regular, human (MYXREDLIN) 100 units/ 100 mL infusion (has no administration in time range)  lactated ringers  infusion (has no administration in time range)  dextrose 5 % in lactated ringers  infusion (has no administration in time range)  dextrose 50 % solution 0-50 mL (has no administration in time range)  potassium chloride  10 mEq in 100 mL IVPB (has no administration in time range)  adenosine (ADENOCARD) 6 MG/2ML injection ( Intravenous Given 05/14/24 2012)  magnesium sulfate IVPB 2 g 50 mL (0 g Intravenous Stopped 05/14/24 2215)  metoprolol tartrate (LOPRESSOR) injection 5 mg (5 mg Intravenous Given 05/14/24 2035)  lactated ringers  bolus 1,860 mL (1,860 mLs Intravenous New Bag/Given 05/14/24 2215)    Vitals:   05/14/24 2024 05/14/24 2039 05/14/24 2100 05/14/24 2130  BP:  (!) 145/75 (!) 142/70 (!) 145/75  Pulse:  (!) 104 95 95  Resp:  18 18 20   Temp:      TempSrc:      SpO2:  97% 97% 95%  Weight: 93 kg     Height: 5\' 8"  (1.727 m)       Final diagnoses:  SVT (supraventricular tachycardia) (HCC)  Diabetic ketoacidosis without coma associated with type 2 diabetes mellitus (HCC)    Admission/ observation were  discussed with the admitting physician, patient and/or family and they are comfortable with the plan.          Final Clinical Impression(s) / ED Diagnoses Final diagnoses:  SVT (supraventricular tachycardia) (HCC)  Diabetic ketoacidosis without coma associated with type 2 diabetes mellitus [facility])    Rx / DC Orders ED Discharge Orders     None         Albertus Hughs, DO 05/14/24 2301

## 2024-05-15 ENCOUNTER — Inpatient Hospital Stay (HOSPITAL_COMMUNITY)

## 2024-05-15 DIAGNOSIS — Z7984 Long term (current) use of oral hypoglycemic drugs: Secondary | ICD-10-CM | POA: Diagnosis not present

## 2024-05-15 DIAGNOSIS — Z9049 Acquired absence of other specified parts of digestive tract: Secondary | ICD-10-CM | POA: Diagnosis not present

## 2024-05-15 DIAGNOSIS — I471 Supraventricular tachycardia, unspecified: Secondary | ICD-10-CM | POA: Diagnosis present

## 2024-05-15 DIAGNOSIS — R008 Other abnormalities of heart beat: Secondary | ICD-10-CM

## 2024-05-15 DIAGNOSIS — J45909 Unspecified asthma, uncomplicated: Secondary | ICD-10-CM | POA: Diagnosis present

## 2024-05-15 DIAGNOSIS — Z1152 Encounter for screening for COVID-19: Secondary | ICD-10-CM | POA: Diagnosis not present

## 2024-05-15 DIAGNOSIS — E871 Hypo-osmolality and hyponatremia: Secondary | ICD-10-CM | POA: Diagnosis present

## 2024-05-15 DIAGNOSIS — I2510 Atherosclerotic heart disease of native coronary artery without angina pectoris: Secondary | ICD-10-CM | POA: Diagnosis present

## 2024-05-15 DIAGNOSIS — I5022 Chronic systolic (congestive) heart failure: Secondary | ICD-10-CM | POA: Diagnosis present

## 2024-05-15 DIAGNOSIS — R002 Palpitations: Secondary | ICD-10-CM | POA: Diagnosis present

## 2024-05-15 DIAGNOSIS — E785 Hyperlipidemia, unspecified: Secondary | ICD-10-CM | POA: Diagnosis present

## 2024-05-15 DIAGNOSIS — Z8619 Personal history of other infectious and parasitic diseases: Secondary | ICD-10-CM | POA: Diagnosis not present

## 2024-05-15 DIAGNOSIS — R739 Hyperglycemia, unspecified: Secondary | ICD-10-CM

## 2024-05-15 DIAGNOSIS — F17210 Nicotine dependence, cigarettes, uncomplicated: Secondary | ICD-10-CM | POA: Diagnosis present

## 2024-05-15 DIAGNOSIS — E878 Other disorders of electrolyte and fluid balance, not elsewhere classified: Secondary | ICD-10-CM | POA: Diagnosis present

## 2024-05-15 DIAGNOSIS — I429 Cardiomyopathy, unspecified: Secondary | ICD-10-CM | POA: Diagnosis present

## 2024-05-15 DIAGNOSIS — I110 Hypertensive heart disease with heart failure: Secondary | ICD-10-CM | POA: Diagnosis present

## 2024-05-15 DIAGNOSIS — F32A Depression, unspecified: Secondary | ICD-10-CM | POA: Diagnosis present

## 2024-05-15 DIAGNOSIS — F419 Anxiety disorder, unspecified: Secondary | ICD-10-CM | POA: Diagnosis present

## 2024-05-15 DIAGNOSIS — Z7982 Long term (current) use of aspirin: Secondary | ICD-10-CM | POA: Diagnosis not present

## 2024-05-15 DIAGNOSIS — Z79899 Other long term (current) drug therapy: Secondary | ICD-10-CM | POA: Diagnosis not present

## 2024-05-15 DIAGNOSIS — K219 Gastro-esophageal reflux disease without esophagitis: Secondary | ICD-10-CM | POA: Diagnosis present

## 2024-05-15 DIAGNOSIS — I4510 Unspecified right bundle-branch block: Secondary | ICD-10-CM | POA: Diagnosis present

## 2024-05-15 DIAGNOSIS — Z8249 Family history of ischemic heart disease and other diseases of the circulatory system: Secondary | ICD-10-CM | POA: Diagnosis not present

## 2024-05-15 LAB — ECHOCARDIOGRAM COMPLETE
AR max vel: 1.78 cm2
AV Area VTI: 1.87 cm2
AV Area mean vel: 1.88 cm2
AV Mean grad: 7 mmHg
AV Peak grad: 13.7 mmHg
Ao pk vel: 1.85 m/s
Area-P 1/2: 7.22 cm2
Height: 68 in
S' Lateral: 3.3 cm
Weight: 3298.08 [oz_av]

## 2024-05-15 LAB — BASIC METABOLIC PANEL WITH GFR
Anion gap: 11 (ref 5–15)
Anion gap: 9 (ref 5–15)
BUN: 7 mg/dL (ref 6–20)
BUN: 9 mg/dL (ref 6–20)
CO2: 21 mmol/L — ABNORMAL LOW (ref 22–32)
CO2: 22 mmol/L (ref 22–32)
Calcium: 8.5 mg/dL — ABNORMAL LOW (ref 8.9–10.3)
Calcium: 8.7 mg/dL — ABNORMAL LOW (ref 8.9–10.3)
Chloride: 103 mmol/L (ref 98–111)
Chloride: 107 mmol/L (ref 98–111)
Creatinine, Ser: 0.47 mg/dL (ref 0.44–1.00)
Creatinine, Ser: 0.61 mg/dL (ref 0.44–1.00)
GFR, Estimated: >60 mL/min (ref >60)
GFR, Estimated: >60 mL/min (ref >60)
Glucose, Bld: 278 mg/dL — ABNORMAL HIGH (ref 70–99)
Glucose, Bld: 357 mg/dL — ABNORMAL HIGH (ref 70–99)
Potassium: 3.9 mmol/L (ref 3.5–5.1)
Potassium: 4.1 mmol/L (ref 3.5–5.1)
Sodium: 135 mmol/L (ref 135–145)
Sodium: 138 mmol/L (ref 135–145)

## 2024-05-15 LAB — CBC
HCT: 37.2 % (ref 36.0–46.0)
Hemoglobin: 11.7 g/dL — ABNORMAL LOW (ref 12.0–15.0)
MCH: 27.7 pg (ref 26.0–34.0)
MCHC: 31.5 g/dL (ref 30.0–36.0)
MCV: 87.9 fL (ref 80.0–100.0)
Platelets: 155 10*3/uL (ref 150–400)
RBC: 4.23 MIL/uL (ref 3.87–5.11)
RDW: 14.5 % (ref 11.5–15.5)
WBC: 6.8 10*3/uL (ref 4.0–10.5)
nRBC: 0 % (ref 0.0–0.2)

## 2024-05-15 LAB — RAPID URINE DRUG SCREEN, HOSP PERFORMED
Amphetamines: NOT DETECTED
Barbiturates: NOT DETECTED
Benzodiazepines: POSITIVE — AB
Cocaine: NOT DETECTED
Opiates: NOT DETECTED
Tetrahydrocannabinol: NOT DETECTED

## 2024-05-15 LAB — D-DIMER, QUANTITATIVE: D-Dimer, Quant: 1.37 ug{FEU}/mL — ABNORMAL HIGH (ref 0.00–0.50)

## 2024-05-15 LAB — CBG MONITORING, ED: Glucose-Capillary: 351 mg/dL — ABNORMAL HIGH (ref 70–99)

## 2024-05-15 LAB — TSH: TSH: 1.397 u[IU]/mL (ref 0.350–4.500)

## 2024-05-15 LAB — HEMOGLOBIN A1C
Hgb A1c MFr Bld: 10.1 % — ABNORMAL HIGH (ref 4.8–5.6)
Mean Plasma Glucose: 243.17 mg/dL

## 2024-05-15 LAB — BETA-HYDROXYBUTYRIC ACID
Beta-Hydroxybutyric Acid: 0.12 mmol/L (ref 0.05–0.27)
Beta-Hydroxybutyric Acid: 0.11 mmol/L (ref 0.05–0.27)

## 2024-05-15 LAB — GLUCOSE, CAPILLARY
Glucose-Capillary: 220 mg/dL — ABNORMAL HIGH (ref 70–99)
Glucose-Capillary: 247 mg/dL — ABNORMAL HIGH (ref 70–99)
Glucose-Capillary: 256 mg/dL — ABNORMAL HIGH (ref 70–99)

## 2024-05-15 MED ORDER — INSULIN GLARGINE-YFGN 100 UNIT/ML ~~LOC~~ SOLN
5.0000 [IU] | SUBCUTANEOUS | Status: DC
  Filled 2024-05-15: qty 0.05

## 2024-05-15 MED ORDER — METOPROLOL TARTRATE 25 MG PO TABS
25.0000 mg | ORAL_TABLET | Freq: Two times a day (BID) | ORAL | Status: DC
  Administered 2024-05-15: 25 mg via ORAL
  Filled 2024-05-15: qty 1

## 2024-05-15 MED ORDER — ENOXAPARIN SODIUM 40 MG/0.4ML IJ SOSY
40.0000 mg | PREFILLED_SYRINGE | INTRAMUSCULAR | Status: DC
  Filled 2024-05-15: qty 0.4

## 2024-05-15 MED ORDER — INSULIN ASPART 100 UNIT/ML IJ SOLN: 0.0000 [IU] | Freq: Every day | INTRAMUSCULAR | Status: DC

## 2024-05-15 MED ORDER — INSULIN ASPART 100 UNIT/ML IJ SOLN
0.0000 [IU] | Freq: Three times a day (TID) | INTRAMUSCULAR | Status: DC
  Administered 2024-05-15: 3 [IU] via SUBCUTANEOUS
  Administered 2024-05-15: 5 [IU] via SUBCUTANEOUS

## 2024-05-15 MED ORDER — INSULIN GLARGINE-YFGN 100 UNIT/ML ~~LOC~~ SOLN
15.0000 [IU] | Freq: Every day | SUBCUTANEOUS | Status: DC
  Administered 2024-05-15: 15 [IU] via SUBCUTANEOUS
  Filled 2024-05-15: qty 0.15

## 2024-05-15 MED ORDER — METOPROLOL SUCCINATE ER 25 MG PO TB24: 25.0000 mg | ORAL_TABLET | Freq: Every day | ORAL | 2 refills | Status: DC

## 2024-05-15 MED ORDER — BUPROPION HCL ER (SR) 150 MG PO TB12
150.0000 mg | ORAL_TABLET | Freq: Two times a day (BID) | ORAL | Status: DC
  Administered 2024-05-15: 150 mg via ORAL
  Filled 2024-05-15: qty 1

## 2024-05-15 MED ORDER — LOSARTAN POTASSIUM 25 MG PO TABS: 25.0000 mg | ORAL_TABLET | Freq: Every day | ORAL | 1 refills | Status: DC

## 2024-05-15 MED ORDER — PERFLUTREN LIPID MICROSPHERE
1.0000 mL | INTRAVENOUS | Status: AC | PRN
  Administered 2024-05-15: 2 mL via INTRAVENOUS

## 2024-05-15 MED ORDER — CITALOPRAM HYDROBROMIDE 20 MG PO TABS
20.0000 mg | ORAL_TABLET | Freq: Every day | ORAL | Status: DC
  Administered 2024-05-15: 20 mg via ORAL
  Filled 2024-05-15: qty 1

## 2024-05-15 MED ORDER — ACETAMINOPHEN 325 MG PO TABS
650.0000 mg | ORAL_TABLET | Freq: Four times a day (QID) | ORAL | Status: DC | PRN
Start: 2024-05-15 — End: 2024-05-15
  Administered 2024-05-15: 650 mg via ORAL
  Filled 2024-05-15: qty 2

## 2024-05-15 NOTE — Consult Note (Signed)
 Cardiology Consultation   Patient ID: MINDI AKERSON MRN: [id number]; DOB: 16-Mar-1966  Admit date: 05/14/2024 Date of Consult: 05/15/2024  PCP:  Genia Kettering, MD   [facility] Providers Cardiologist:  None        Patient Profile:   [name] [name] is a 58 y.o. female with a hx of DM and HTN who is being seen 05/15/2024 for the evaluation of SVT at the request of Dr. Drexel Gentles.  History of Present Illness:   [name] is a pleasant 58 yo woman with a h/o DM. She has a h/o peripartum CM and saw Dr. Audery Blazing over 10 years ago. He EF improved. She has had variably controlled DM. She was treated with phenteramine for weight loss. She stopped this. She had sustained palpitations lasting several hours a few days ago and then yesterday. On presentation she was found to have SVT at 175/min and was treated with IV adenosine. She had palpitations and sob with her episode. She had a 2D echo demonstrating an EF of 40% with global HK. She feels well. No other complaints.    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 Authorizing Provider  albuterol  (VENTOLIN  HFA) 108 (90 Base) MCG/ACT inhaler Inhale 2 puffs into the lungs every 4 (four) hours as needed for wheezing or shortness of breath. 01/06/23   [name], Aleksei V, MD  ALPRAZolam  (XANAX ) 0.5 MG tablet TAKE 1 TABLET(0.5 MG) BY MOUTH TWICE DAILY 01/06/23   [name], Aleksei V, MD  aspirin  81 MG chewable tablet Chew 1 tablet (81 mg  total) by mouth daily. 05/19/17   [name], Aleksei V, MD  atorvastatin  (LIPITOR) 10 MG tablet TAKE 1 TABLET BY MOUTH DAILY AT 6 PM 03/10/23   [name], Aleksei V, MD  buPROPion  (WELLBUTRIN  SR) 150 MG 12 hr tablet TAKE 1 TABLET BY MOUTH TWICE DAILY 03/02/24   [name], Aleksei V, MD  Cholecalciferol  (EQL VITAMIN D3) 1000 units tablet Take 1 tablet (1,000 Units total) by mouth daily. 05/19/17   [name], Oakley Bellman, MD  citalopram  (CELEXA ) 40 MG tablet TAKE 1 TABLET(40 MG) BY MOUTH DAILY 09/15/23   [name], Oakley Bellman, MD  gabapentin  (NEURONTIN ) 100 MG capsule TAKE 1 CAPSULE(100 MG) BY MOUTH THREE TIMES DAILY 01/29/24   [name], Aleksei V, MD  GLYXAMBI  25-5 MG TABS TAKE 1 TABLET BY MOUTH DAILY 03/02/24   [name], Aleksei V, MD  metFORMIN  (GLUCOPHAGE ) 500 MG tablet TAKE 2 TABLETS TWICE DAILY WITH FOOD 04/28/24   [name], Aleksei V, MD  mupirocin  ointment (BACTROBAN ) 2 % On leg wound w/dressing change qd or bid 05/08/23   [name], Aleksei V, MD  ondansetron  (ZOFRAN  ODT) 4 MG disintegrating tablet Take 1 tablet (4 mg total) by mouth every 8 (eight) hours as needed for nausea or vomiting. 01/06/23   [name], Oakley Bellman, MD  pantoprazole  (PROTONIX ) 40 MG  tablet TAKE 1 TABLET(40 MG) BY MOUTH TWICE DAILY 01/27/24   [name], Aleksei V, MD  phentermine  (ADIPEX-P ) 37.5 MG tablet Take 1 tablet (37.5 mg total) by mouth daily before breakfast. 12/02/23   [name], Aleksei V, MD  potassium chloride  SA (KLOR-CON  M) 20 MEQ tablet Take 1 tablet (20 mEq total) by mouth daily. 10/25/23   [name], Aleksei V, MD  repaglinide  (PRANDIN ) 2 MG tablet TAKE 1 TABLET BY MOUTH THREE TIMES DAILY BEFORE MEALS 02/01/24   [name], Aleksei V, MD    Inpatient Medications: Scheduled Meds:  buPROPion  ER  150 mg Oral BID   citalopram   20 mg Oral Daily   enoxaparin (LOVENOX) injection  40 mg Subcutaneous Q24H   insulin  aspart  0-5 Units Subcutaneous QHS   insulin  aspart  0-9 Units Subcutaneous TID WC   insulin  glargine-yfgn  15  Units Subcutaneous Daily   metoprolol tartrate  25 mg Oral BID   Continuous Infusions:  PRN Meds: acetaminophen , dextrose  Allergies:   No Known Allergies  Social History:   Social History   Socioeconomic History   Marital status: Married    Spouse name: Ronnie   Number of children: 2   Years of education: Not on file   Highest education level: Bachelor's degree (e.g., BA, AB, BS)  Occupational History   Occupation: [employer] features editor: [employer]  Tobacco Use   Smoking status: Former    Current packs/day: 0.50    Average packs/day: 0.5 packs/day for 15.0 years (7.5 ttl pk-yrs)    Types: Cigarettes   Smokeless tobacco: Never   Tobacco comments:    info given 12/14/2019  Vaping Use   Vaping status: Never Used  Substance and Sexual Activity   Alcohol use: No   Drug use: No   Sexual activity: Yes    Birth control/protection: Post-menopausal  Other Topics Concern   Not on file  Social History Narrative   Not on file   Social Drivers of Health   Financial Resource Strain: Low Risk  (04/07/2023)   Overall Financial Resource Strain (CARDIA)    Difficulty of Paying Living Expenses: Not very hard  Food Insecurity: No Food Insecurity (05/15/2024)   Hunger Vital Sign    Worried About Running Out of Food in the Last Year: Never true    Ran Out of Food in the Last Year: Never true  Transportation Needs: No Transportation Needs (05/15/2024)   PRAPARE - Administrator, Civil Service (Medical): No    Lack of Transportation (Non-Medical): No  Physical Activity: Insufficiently Active (04/07/2023)   Exercise Vital Sign    Days of Exercise per Week: 1 day    Minutes of Exercise per Session: 10 min  Stress: Stress Concern Present (04/07/2023)   [employer] of Occupational Health - Occupational Stress Questionnaire    Feeling of Stress : To some extent  Social Connections: Moderately Integrated (04/07/2023)   Social Connection and Isolation Panel [NHANES]     Frequency of Communication with Friends and Family: More than three times a week    Frequency of Social Gatherings with Friends and Family: Never    Attends Religious Services: 1 to 4 times per year    Active Member of [employer] or Organizations: No    Attends Banker Meetings: Not on file    Marital Status: Married  Intimate Partner Violence: Not At Risk (05/15/2024)   Humiliation, Afraid, Rape, and Kick questionnaire    Fear of Current or  Ex-Partner: No    Emotionally Abused: No    Physically Abused: No    Sexually Abused: No    Family History:    Family History  Problem Relation Age of Onset   Diabetes Mother    Hypertension Mother    Inflammatory bowel disease Maternal Aunt    Colon cancer Paternal Aunt    Colon polyps Maternal Grandmother    Hypertension Other    Liver cancer Neg Hx    Pancreatic cancer Neg Hx    Rectal cancer Neg Hx    Stomach cancer Neg Hx    Esophageal cancer Neg Hx      ROS:  Please see the history of present illness.   All other ROS reviewed and negative.     Physical Exam/Data:   Vitals:   05/15/24 0121 05/15/24 0444 05/15/24 0726 05/15/24 1200  BP: 136/67 (!) 148/76 (!) 142/67 (!) 116/96  Pulse: 97 100 96 81  Resp: 18  18 18   Temp: 97.8 F (36.6 C) 97.7 F (36.5 C) 98.2 F (36.8 C) 98.6 F (37 C)  TempSrc: Oral Oral Oral Oral  SpO2: 97% 95% 95% 96%  Weight:      Height:        Intake/Output Summary (Last 24 hours) at 05/15/2024 1238 Last data filed at 05/15/2024 1234 Gross per 24 hour  Intake 1704.19 ml  Output --  Net 1704.19 ml      05/15/2024    1:06 AM 05/14/2024    8:24 PM 01/19/2024    1:22 PM  Last 3 Weights  Weight (lbs) 206 lb 2.1 oz 205 lb 0.4 oz 205 lb  Weight (kg) 93.5 kg 93 kg 92.987 kg     Body mass index is 31.34 kg/m.  General:  Well nourished, well developed, in no acute distress HEENT: normal Neck: no JVD Vascular: No carotid bruits; Distal pulses 2+ bilaterally Cardiac:  normal S1, S2;  RRR; no murmur  Lungs:  clear to auscultation bilaterally, no wheezing, rhonchi or rales  Abd: soft, nontender, no hepatomegaly  Ext: no edema Musculoskeletal:  No deformities, BUE and BLE strength normal and equal Skin: warm and dry  Neuro:  CNs 2-12 intact, no focal abnormalities noted Psych:  Normal affect   EKG:  The EKG was personally reviewed and demonstrates:  nsr Telemetry:  Telemetry was personally reviewed and demonstrates:  nsr  Relevant CV Studies: 2D echo noted.  Laboratory Data:  High Sensitivity Troponin:  No results for input(s): "TROPONINIHS" in the last 720 hours.   Chemistry Recent Labs  Lab 05/14/24 2018 05/14/24 2217 05/14/24 2351  NA 131* 135 135  K 4.2 4.1 4.1  CL 96*  --  103  CO2 19*  --  21*  GLUCOSE 578*  --  357*  BUN 10  --  9  CREATININE 0.75  --  0.61  CALCIUM  10.0  --  8.7*  MG 2.0  --   --   GFRNONAA >60  --  >60  ANIONGAP 17*  --  11    Recent Labs  Lab 05/14/24 2018  PROT 7.6  ALBUMIN 4.4  AST 32  ALT 32  ALKPHOS 115  BILITOT 0.5   Lipids No results for input(s): "CHOL", "TRIG", "HDL", "LABVLDL", "LDLCALC", "CHOLHDL" in the last 168 hours.  Hematology Recent Labs  Lab 05/11/24 1458 05/14/24 2018 05/14/24 2217 05/15/24 0810  WBC 8.8 12.0*  --  6.8  RBC 4.50 4.90  --  4.23  HGB 13.4  13.8 12.9 11.7*  HCT 39.0 42.5 38.0 37.2  MCV 86.7 86.7  --  87.9  MCH 29.8 28.2  --  27.7  MCHC 34.4 32.5  --  31.5  RDW 14.4 14.5  --  14.5  PLT 214 234  --  155   Thyroid  No results for input(s): "TSH", "FREET4" in the last 168 hours.  BNPNo results for input(s): "BNP", "PROBNP" in the last 168 hours.  DDimer  Recent Labs  Lab 05/15/24 0810  DDIMER 1.37*     Radiology/Studies:  ECHOCARDIOGRAM COMPLETE Result Date: 05/15/2024    ECHOCARDIOGRAM REPORT   Patient Name:   MARLYS STEGMAIER Date of Exam: 05/15/2024 Medical Rec #:  [id number]       Height:       68.0 in Accession #:    [id number]      Weight:       206.1 lb Date of Birth:   1966-07-03      BSA:          2.070 m Patient Age:    57 years        BP:           142/67 mmHg Patient Gender: F               HR:           101 bpm. Exam Location:  Inpatient Procedure: 2D Echo, Cardiac Doppler, Color Doppler and Intracardiac            Opacification Agent (Both Spectral and Color Flow Doppler were            utilized during procedure). Indications:    Other abnormalities of the heart R00.8  History:        Patient has no prior history of Echocardiogram examinations.                 CHF; Risk Factors:Diabetes.  Sonographer:    Astrid Blamer Referring Phys: [id number] VASUNDHRA RATHORE IMPRESSIONS  1. Akinesis of the distal anteroseptal wall and apex with overall mild to moderate LV dysfunction.  2. Left ventricular ejection fraction, by estimation, is 40 to 45%. The left ventricle has mild to moderately decreased function. The left ventricle demonstrates regional wall motion abnormalities (see scoring diagram/findings for description). There is  mild left ventricular hypertrophy. Left ventricular diastolic parameters are consistent with Grade I diastolic dysfunction (impaired relaxation).  3. Right ventricular systolic function is normal. The right ventricular size is normal.  4. The mitral valve is normal in structure. Trivial mitral valve regurgitation. No evidence of mitral stenosis.  5. The aortic valve is tricuspid. Aortic valve regurgitation is not visualized. Aortic valve sclerosis is present, with no evidence of aortic valve stenosis. Comparison(s): No prior Echocardiogram. FINDINGS  Left Ventricle: Left ventricular ejection fraction, by estimation, is 40 to 45%. The left ventricle has mild to moderately decreased function. The left ventricle demonstrates regional wall motion abnormalities. Definity contrast agent was given IV to delineate the left ventricular endocardial borders. The left ventricular internal cavity size was normal in size. There is mild left ventricular hypertrophy. Left  ventricular diastolic parameters are consistent with Grade I diastolic dysfunction (impaired relaxation). Right Ventricle: The right ventricular size is normal. Right ventricular systolic function is normal. Left Atrium: Left atrial size was normal in size. Right Atrium: Right atrial size was normal in size. Pericardium: There is no evidence of pericardial effusion. Mitral Valve: The mitral valve is normal in structure. Trivial mitral valve  regurgitation. No evidence of mitral valve stenosis. Tricuspid Valve: The tricuspid valve is normal in structure. Tricuspid valve regurgitation is trivial. No evidence of tricuspid stenosis. Aortic Valve: The aortic valve is tricuspid. Aortic valve regurgitation is not visualized. Aortic valve sclerosis is present, with no evidence of aortic valve stenosis. Aortic valve mean gradient measures 7.0 mmHg. Aortic valve peak gradient measures 13.7 mmHg. Aortic valve area, by VTI measures 1.87 cm. Pulmonic Valve: The pulmonic valve was normal in structure. Pulmonic valve regurgitation is not visualized. No evidence of pulmonic stenosis. Aorta: The aortic root is normal in size and structure. Venous: The inferior vena cava was not well visualized. IAS/Shunts: The interatrial septum is aneurysmal. The interatrial septum was not well visualized. Additional Comments: Akinesis of the distal anteroseptal wall and apex with overall mild to moderate LV dysfunction.  LEFT VENTRICLE PLAX 2D LVIDd:         4.70 cm   Diastology LVIDs:         3.30 cm   LV e' medial:    7.07 cm/s LV PW:         1.30 cm   LV E/e' medial:  11.6 LV IVS:        1.40 cm   LV e' lateral:   6.64 cm/s LVOT diam:     2.00 cm   LV E/e' lateral: 12.3 LV SV:         70 LV SV Index:   34 LVOT Area:     3.14 cm  RIGHT VENTRICLE RV S prime:     14.50 cm/s TAPSE (M-mode): 2.5 cm LEFT ATRIUM             Index        RIGHT ATRIUM           Index LA Vol (A2C):   38.2 ml 18.45 ml/m  RA Area:     11.80 cm LA Vol (A4C):   32.6 ml  15.75 ml/m  RA Volume:   23.60 ml  11.40 ml/m LA Biplane Vol: 35.9 ml 17.34 ml/m  AORTIC VALVE AV Area (Vmax):    1.78 cm AV Area (Vmean):   1.88 cm AV Area (VTI):     1.87 cm AV Vmax:           185.00 cm/s AV Vmean:          131.000 cm/s AV VTI:            0.373 m AV Peak Grad:      13.7 mmHg AV Mean Grad:      7.0 mmHg LVOT Vmax:         105.00 cm/s LVOT Vmean:        78.300 cm/s LVOT VTI:          0.222 m LVOT/AV VTI ratio: 0.60  AORTA Ao Root diam: 2.50 cm MITRAL VALVE MV Area (PHT): 7.22 cm     SHUNTS MV Decel Time: 105 msec     Systemic VTI:  0.22 m MV E velocity: 81.90 cm/s   Systemic Diam: 2.00 cm MV A velocity: 147.00 cm/s MV E/A ratio:  0.56 Alexandria Angel MD Electronically signed by Alexandria Angel MD Signature Date/Time: 05/15/2024/11:00:03 AM    Final    DG Chest Port 1 View Result Date: 05/14/2024 CLINICAL DATA:  Cough, tachycardia, chest pressure EXAM: PORTABLE CHEST 1 VIEW COMPARISON:  01/03/2023 FINDINGS: Single frontal view of the chest demonstrates an unremarkable cardiac silhouette. No acute airspace disease, effusion, or  pneumothorax. No acute bony abnormalities. IMPRESSION: 1. No acute intrathoracic process. Electronically Signed   By: Bobbye Burrow M.D.   On: 05/14/2024 20:42     Assessment and Plan:   SVT - I have discussed the treatment options. Agree with toprol xl 25 mg daily.  LV dysfunction - this could be due to pheneramine, prior peripartum CM, SVT or CAD or other. I would suggest along with low dose toprol, starting low dose losartan, 25 mg daily. I'll see her back and schedule her a coronary CT scan as an outpatient in addition to considering catheter ablation.  DM - she should followup with Dr.[name]. I would think she would be a candidate to go back on Ozempic .  Disp -ok to discharge home from cardiac perspective on the meds outlined above.   Risk Assessment/Risk Scores:      [facility] will sign off.   Medication Recommendations:  toprol 25  daily and losartan 25 daily Other recommendations (labs, testing, etc):  EP followup with me in 3-4 weeks Follow up as an outpatient:    For questions or updates, please contact [facility] Please consult [email or web address] for contact info under    Signed, Manya Sells, MD  05/15/2024 12:38 PM

## 2024-05-15 NOTE — Progress Notes (Signed)
 DISCHARGE NOTE HOME [name] [name] to be discharged Home per MD order. Discussed prescriptions and follow up appointments with the patient. Prescriptions given to patient; medication list explained in detail. Patient verbalized understanding.  Skin clean, dry and intact without evidence of skin break down, no evidence of skin tears noted. IV catheter discontinued intact. Site without signs and symptoms of complications. Dressing and pressure applied. Pt denies pain at the site currently. No complaints noted.  Patient free of lines, drains, and wounds.   An After Visit Summary (AVS) was printed and given to the patient. Patient escorted via wheelchair, and discharged home via private auto.  Tonda Francisco, RN

## 2024-05-15 NOTE — Discharge Summary (Signed)
 Physician Discharge Summary  [name] [name] GNF:[id number] DOB: [birth year] DOA: 05/14/2024  PCP: Genia Kettering, MD  Admit date: 05/14/2024 Discharge date: 05/15/2024  Time spent: 35 minutes  Recommendations for Outpatient Follow-up:  Dr.Gregg Carolynne Citron in 3 weeks Outpatient coronary CTA to rule out CAD PCP in 1 week, has uncontrolled diabetes   Discharge Diagnoses:  Principal Problem:   SVT (supraventricular tachycardia) (HCC) Cardiomyopathy, systolic CHF Uncontrolled type 2 diabetes mellitus with hyperglycemia   Anxiety disorder   Asthma, chronic   GERD (gastroesophageal reflux disease)   Hyperglycemia   Discharge Condition: Improved  Diet recommendation:, Heart healthy  Filed Weights   05/14/24 2024 05/15/24 0106  Weight: 93 kg 93.5 kg    History of present illness:  58/F w longstanding uncontrolled type 2 diabetes, hyperlipidemia, remote peripartum cardiomyopathy which resolved, asthma, migraine headaches, anxiety, depression, GERD presented to ED with acute onset of palpitations on 5/24.  Preceded by URI few days. Consumes large number of diet sodas daily. -In the ED she was noted to be in SVT with heart rate in the 170s, given adenosine, improved  - Initial blood glucose was 578 , improved after insulin  and fluids, mild metabolic acidosis with elevated anion gap on admission which quickly closed  Hospital Course:   SVT - sp adenosine in the ED overnight, history of recent URI, excessive caffeine consumption, also started on phentermine  in December, allegedly has not been taking it for the last few weeks -TSH was normal -Echo with cardiomyopathy and wall motion abnormality - Started on Toprol, seen by EP, recommended follow-up in few weeks   Systolic CHF -Echo with EF 40-45% with wall motion abnormality -Raises concern for ischemic cardiomyopathy considering risk factors - Seen by cardiology Dr. Carolynne Citron in consultation who felt her LV dysfunction could be  related to phentermine  SVT or CAD, repeat recommended low-dose Toprol, losartan and follow-up in clinic for coronary CTA as outpatient in addition to considering catheter ablation   Hyperglycemia Poorly controlled type 2 diabetes -Presented with severe hyperglycemia with glucose initially in the 570s, mild acidosis which resolved, - No evidence of DKA on repeat labs, beta hydroxybutyrate acid was negative, ketones negative and urine - A1c is 10.1, she is on multiple oral hypoglycemics and reluctant to start insulin  at discharge, wants to discuss options with her PCP this week - Seen by diabetes coordinator and consult, Home regimen of metformin , repaglinide  and Glyxambi  resumed   Asthma: Stable, no signs of acute exacerbation.   Anxiety and depression -Restart Wellbutrin , Celexa    GERD -Restart PPI  Consultations: Cardiology Dr. Carolynne Citron  Discharge Exam: Vitals:   05/15/24 0726 05/15/24 1200  BP: (!) 142/67 (!) 116/96  Pulse: 96 81  Resp: 18 18  Temp: 98.2 F (36.8 C) 98.6 F (37 C)  SpO2: 95% 96%   Gen: Awake, Alert, Oriented X 3,  HEENT: no JVD Lungs: Good air movement bilaterally, CTAB CVS: S1S2/RRR Abd: soft, Non tender, non distended, BS present Extremities: No edema Skin: no new rashes on exposed skin   Discharge Instructions   Discharge Instructions     Diet - low sodium heart healthy   Complete by: As directed    Diet Carb Modified   Complete by: As directed    Increase activity slowly   Complete by: As directed       Allergies as of 05/15/2024   No Known Allergies      Medication List     STOP taking these medications    phentermine   37.5 MG tablet Commonly known as: ADIPEX-P    potassium chloride  SA 20 MEQ tablet Commonly known as: KLOR-CON  M       TAKE these medications    albuterol  108 (90 Base) MCG/ACT inhaler Commonly known as: VENTOLIN  HFA Inhale 2 puffs into the lungs every 4 (four) hours as needed for wheezing or shortness of  breath.   ALPRAZolam  0.5 MG tablet Commonly known as: XANAX  TAKE 1 TABLET(0.5 MG) BY MOUTH TWICE DAILY   aspirin  81 MG chewable tablet Chew 1 tablet (81 mg total) by mouth daily.   atorvastatin  10 MG tablet Commonly known as: LIPITOR TAKE 1 TABLET BY MOUTH DAILY AT 6 PM   buPROPion  150 MG 12 hr tablet Commonly known as: WELLBUTRIN  SR TAKE 1 TABLET BY MOUTH TWICE DAILY   Cholecalciferol  25 MCG (1000 UT) tablet Commonly known as: EQL Vitamin D3 Take 1 tablet (1,000 Units total) by mouth daily.   citalopram  40 MG tablet Commonly known as: CELEXA  TAKE 1 TABLET(40 MG) BY MOUTH DAILY   gabapentin  100 MG capsule Commonly known as: NEURONTIN  TAKE 1 CAPSULE(100 MG) BY MOUTH THREE TIMES DAILY   Glyxambi  25-5 MG Tabs Generic drug: Empagliflozin -linaGLIPtin  TAKE 1 TABLET BY MOUTH DAILY   losartan 25 MG tablet Commonly known as: COZAAR Take 1 tablet (25 mg total) by mouth daily.   metFORMIN  500 MG tablet Commonly known as: GLUCOPHAGE  TAKE 2 TABLETS TWICE DAILY WITH FOOD   metoprolol succinate 25 MG 24 hr tablet Commonly known as: Toprol XL Take 1 tablet (25 mg total) by mouth daily.   mupirocin  ointment 2 % Commonly known as: BACTROBAN  On leg wound w/dressing change qd or bid   ondansetron  4 MG disintegrating tablet Commonly known as: Zofran  ODT Take 1 tablet (4 mg total) by mouth every 8 (eight) hours as needed for nausea or vomiting.   pantoprazole  40 MG tablet Commonly known as: PROTONIX  TAKE 1 TABLET(40 MG) BY MOUTH TWICE DAILY   repaglinide  2 MG tablet Commonly known as: PRANDIN  TAKE 1 TABLET BY MOUTH THREE TIMES DAILY BEFORE MEALS       No Known Allergies    The results of significant diagnostics from this hospitalization (including imaging, microbiology, ancillary and laboratory) are listed below for reference.    Significant Diagnostic Studies: ECHOCARDIOGRAM COMPLETE Result Date: 05/15/2024    ECHOCARDIOGRAM REPORT   Patient Name:   [name] [name]  Date of Exam: 05/15/2024 Medical Rec #:  [id number]       Height:       68.0 in Accession #:    [id number]      Weight:       206.1 lb Date of Birth:  Feb 14, 1966      BSA:          2.070 m Patient Age:    58 years        BP:           142/67 mmHg Patient Gender: F               HR:           101 bpm. Exam Location:  Inpatient Procedure: 2D Echo, Cardiac Doppler, Color Doppler and Intracardiac            Opacification Agent (Both Spectral and Color Flow Doppler were            utilized during procedure). Indications:    Other abnormalities of the heart R00.8  History:  Patient has no prior history of Echocardiogram examinations.                 CHF; Risk Factors:Diabetes.  Sonographer:    Astrid Blamer Referring Phys: [id number] VASUNDHRA RATHORE IMPRESSIONS  1. Akinesis of the distal anteroseptal wall and apex with overall mild to moderate LV dysfunction.  2. Left ventricular ejection fraction, by estimation, is 40 to 45%. The left ventricle has mild to moderately decreased function. The left ventricle demonstrates regional wall motion abnormalities (see scoring diagram/findings for description). There is  mild left ventricular hypertrophy. Left ventricular diastolic parameters are consistent with Grade I diastolic dysfunction (impaired relaxation).  3. Right ventricular systolic function is normal. The right ventricular size is normal.  4. The mitral valve is normal in structure. Trivial mitral valve regurgitation. No evidence of mitral stenosis.  5. The aortic valve is tricuspid. Aortic valve regurgitation is not visualized. Aortic valve sclerosis is present, with no evidence of aortic valve stenosis. Comparison(s): No prior Echocardiogram. FINDINGS  Left Ventricle: Left ventricular ejection fraction, by estimation, is 40 to 45%. The left ventricle has mild to moderately decreased function. The left ventricle demonstrates regional wall motion abnormalities. Definity contrast agent was given IV to delineate the  left ventricular endocardial borders. The left ventricular internal cavity size was normal in size. There is mild left ventricular hypertrophy. Left ventricular diastolic parameters are consistent with Grade I diastolic dysfunction (impaired relaxation). Right Ventricle: The right ventricular size is normal. Right ventricular systolic function is normal. Left Atrium: Left atrial size was normal in size. Right Atrium: Right atrial size was normal in size. Pericardium: There is no evidence of pericardial effusion. Mitral Valve: The mitral valve is normal in structure. Trivial mitral valve regurgitation. No evidence of mitral valve stenosis. Tricuspid Valve: The tricuspid valve is normal in structure. Tricuspid valve regurgitation is trivial. No evidence of tricuspid stenosis. Aortic Valve: The aortic valve is tricuspid. Aortic valve regurgitation is not visualized. Aortic valve sclerosis is present, with no evidence of aortic valve stenosis. Aortic valve mean gradient measures 7.0 mmHg. Aortic valve peak gradient measures 13.7 mmHg. Aortic valve area, by VTI measures 1.87 cm. Pulmonic Valve: The pulmonic valve was normal in structure. Pulmonic valve regurgitation is not visualized. No evidence of pulmonic stenosis. Aorta: The aortic root is normal in size and structure. Venous: The inferior vena cava was not well visualized. IAS/Shunts: The interatrial septum is aneurysmal. The interatrial septum was not well visualized. Additional Comments: Akinesis of the distal anteroseptal wall and apex with overall mild to moderate LV dysfunction.  LEFT VENTRICLE PLAX 2D LVIDd:         4.70 cm   Diastology LVIDs:         3.30 cm   LV e' medial:    7.07 cm/s LV PW:         1.30 cm   LV E/e' medial:  11.6 LV IVS:        1.40 cm   LV e' lateral:   6.64 cm/s LVOT diam:     2.00 cm   LV E/e' lateral: 12.3 LV SV:         70 LV SV Index:   34 LVOT Area:     3.14 cm  RIGHT VENTRICLE RV S prime:     14.50 cm/s TAPSE (M-mode): 2.5 cm  LEFT ATRIUM             Index        RIGHT ATRIUM  Index LA Vol (A2C):   38.2 ml 18.45 ml/m  RA Area:     11.80 cm LA Vol (A4C):   32.6 ml 15.75 ml/m  RA Volume:   23.60 ml  11.40 ml/m LA Biplane Vol: 35.9 ml 17.34 ml/m  AORTIC VALVE AV Area (Vmax):    1.78 cm AV Area (Vmean):   1.88 cm AV Area (VTI):     1.87 cm AV Vmax:           185.00 cm/s AV Vmean:          131.000 cm/s AV VTI:            0.373 m AV Peak Grad:      13.7 mmHg AV Mean Grad:      7.0 mmHg LVOT Vmax:         105.00 cm/s LVOT Vmean:        78.300 cm/s LVOT VTI:          0.222 m LVOT/AV VTI ratio: 0.60  AORTA Ao Root diam: 2.50 cm MITRAL VALVE MV Area (PHT): 7.22 cm     SHUNTS MV Decel Time: 105 msec     Systemic VTI:  0.22 m MV E velocity: 81.90 cm/s   Systemic Diam: 2.00 cm MV A velocity: 147.00 cm/s MV E/A ratio:  0.56 Alexandria Angel MD Electronically signed by Alexandria Angel MD Signature Date/Time: 05/15/2024/11:00:03 AM    Final    DG Chest Port 1 View Result Date: 05/14/2024 CLINICAL DATA:  Cough, tachycardia, chest pressure EXAM: PORTABLE CHEST 1 VIEW COMPARISON:  01/03/2023 FINDINGS: Single frontal view of the chest demonstrates an unremarkable cardiac silhouette. No acute airspace disease, effusion, or pneumothorax. No acute bony abnormalities. IMPRESSION: 1. No acute intrathoracic process. Electronically Signed   By: Bobbye Burrow M.D.   On: 05/14/2024 20:42    Microbiology: Recent Results (from the past 240 hours)  Resp panel by RT-PCR (RSV, Flu A&B, Covid) Anterior Nasal Swab     Status: None   Collection Time: 05/14/24  8:18 PM   Specimen: Anterior Nasal Swab  Result Value Ref Range Status   SARS Coronavirus 2 by RT PCR NEGATIVE NEGATIVE Final    Comment: (NOTE) SARS-CoV-2 target nucleic acids are NOT DETECTED.  The SARS-CoV-2 RNA is generally detectable in upper respiratory specimens during the acute phase of infection. The lowest concentration of SARS-CoV-2 viral copies this assay can detect is 138  copies/mL. A negative result does not preclude SARS-Cov-2 infection and should not be used as the sole basis for treatment or other patient management decisions. A negative result may occur with  improper specimen collection/handling, submission of specimen other than nasopharyngeal swab, presence of viral mutation(s) within the areas targeted by this assay, and inadequate number of viral copies(<138 copies/mL). A negative result must be combined with clinical observations, patient history, and epidemiological information. The expected result is Negative.  Fact Sheet for Patients:  BloggerCourse.com  Fact Sheet for Healthcare Providers:  SeriousBroker.it  This test is no t yet approved or cleared by the [place]  FDA and  has been authorized for detection and/or diagnosis of SARS-CoV-2 by FDA under an Emergency Use Authorization (EUA). This EUA will remain  in effect (meaning this test can be used) for the duration of the COVID-19 declaration under Section 564(b)(1) of the Act, 21 U.S.C.section 360bbb-3(b)(1), unless the authorization is terminated  or revoked sooner.       Influenza A by PCR NEGATIVE NEGATIVE Final   Influenza B  by PCR NEGATIVE NEGATIVE Final    Comment: (NOTE) The Xpert Xpress SARS-CoV-2/FLU/RSV plus assay is intended as an aid in the diagnosis of influenza from Nasopharyngeal swab specimens and should not be used as a sole basis for treatment. Nasal washings and aspirates are unacceptable for Xpert Xpress SARS-CoV-2/FLU/RSV testing.  Fact Sheet for Patients: BloggerCourse.com  Fact Sheet for Healthcare Providers: SeriousBroker.it  This test is not yet approved or cleared by the [place]  FDA and has been authorized for detection and/or diagnosis of SARS-CoV-2 by FDA under an Emergency Use Authorization (EUA). This EUA will remain in effect (meaning  this test can be used) for the duration of the COVID-19 declaration under Section 564(b)(1) of the Act, 21 U.S.C. section 360bbb-3(b)(1), unless the authorization is terminated or revoked.     Resp Syncytial Virus by PCR NEGATIVE NEGATIVE Final    Comment: (NOTE) Fact Sheet for Patients: BloggerCourse.com  Fact Sheet for Healthcare Providers: SeriousBroker.it  This test is not yet approved or cleared by the [place]  FDA and has been authorized for detection and/or diagnosis of SARS-CoV-2 by FDA under an Emergency Use Authorization (EUA). This EUA will remain in effect (meaning this test can be used) for the duration of the COVID-19 declaration under Section 564(b)(1) of the Act, 21 U.S.C. section 360bbb-3(b)(1), unless the authorization is terminated or revoked.  Performed at [facility], [address] [place], [place] [address]      Labs: Basic Metabolic Panel: Recent Labs  Lab 05/14/24 2018 05/14/24 2217 05/14/24 2351  NA 131* 135 135  K 4.2 4.1 4.1  CL 96*  --  103  CO2 19*  --  21*  GLUCOSE 578*  --  357*  BUN 10  --  9  CREATININE 0.75  --  0.61  CALCIUM  10.0  --  8.7*  MG 2.0  --   --    Liver Function Tests: Recent Labs  Lab 05/14/24 2018  AST 32  ALT 32  ALKPHOS 115  BILITOT 0.5  PROT 7.6  ALBUMIN 4.4   No results for input(s): "LIPASE", "AMYLASE" in the last 168 hours. No results for input(s): "AMMONIA" in the last 168 hours. CBC: Recent Labs  Lab 05/11/24 1458 05/14/24 2018 05/14/24 2217 05/15/24 0810  WBC 8.8 12.0*  --  6.8  NEUTROABS 6.0 8.7*  --   --   HGB 13.4 13.8 12.9 11.7*  HCT 39.0 42.5 38.0 37.2  MCV 86.7 86.7  --  87.9  PLT 214 234  --  155   Cardiac Enzymes: No results for input(s): "CKTOTAL", "CKMB", "CKMBINDEX", "TROPONINI" in the last 168 hours. BNP: BNP (last 3 results) No results for input(s): "BNP" in the last 8760 hours.  ProBNP (last 3  results) No results for input(s): "PROBNP" in the last 8760 hours.  CBG: Recent Labs  Lab 05/14/24 2211 05/14/24 2349 05/15/24 0324 05/15/24 0605 05/15/24 1158  GLUCAP 428* 351* 220* 247* 256*       Signed:  Deforest Fast MD.  [facility] 05/15/2024, 1:22 PM

## 2024-05-15 NOTE — ED Notes (Signed)
 Pt out of ED with [facility] with all belongings and paperwork.

## 2024-05-15 NOTE — Progress Notes (Addendum)
 Follow up arranged with cardiology /EP Dr Carolynne Citron on 06/10/24.

## 2024-05-15 NOTE — Progress Notes (Addendum)
 Plan of Care Note for accepted transfer   Patient: [name] [name] MRN: [id number]   DOA: 05/14/2024  Facility requesting transfer: [facility] Requesting Provider: Albertus Hughs, DO Reason for transfer: SVT, DKA Facility course: [name] [name] is a 58 y.o. female with h/o DM2, asthma and migraine, coming with a chief complaint of heart palpitations. This Has been going on for about 3-1/2 hours today.  She reluctantly then came to the emergency department for evaluation.  She had something similar happen earlier in the week.  Called her family doctor was scheduled appointment with her in the clinic.  This never happened before this.  She was taking some weight loss medication but stopped about 3 days ago.  She drinks quite a bit of caffeinated beverages but denies any significant change.  Denies any medication for ADHD.  Denies any new over-the-counter supplements.   She has been coughing quite a bit for the past week or so.  Feels like maybe she has a sinus infection as well.   When she came to the ER BP was 157/95 with heart rate of 172 and after adenosine 114 with otherwise normal vital signs.  Labs revealed a  glucose of 578 with CO2 of 19 and anion gap of 17 with hyponatremia and hypochloremia and CBC of 12.  Respiratory panel was negative.  Portable chest x-ray showed no acute cardiopulmonary disease.  EKG showed sinus tachycardia with rate 118 with right atrial enlargement, right bundle branch block and LVH with IVCD and T wave inversion anteroseptally.   The patient was given 2 g of IV magnesium sulfate and 5 mg of IV Lopressor in addition to IV adenosine and lactated ringer  bolus and IV insulin  drip per Endo tool.. Plan of care: The patient is accepted for admission to Progressive unit, at [facility]..  She will be under the care of the EDP till arrival to [facility]  Author: Virgene Griffin, MD 05/15/2024  Check [email or web address] for on-call coverage.  Nursing staff, Please call [facility] Admits &  Consults System-Wide number on Amion as soon as patient's arrival, so appropriate admitting provider can evaluate the pt.

## 2024-05-15 NOTE — H&P (Addendum)
 History and Physical    [name] [name] VZD:[id number] DOB: [birth year] DOA: 05/14/2024  PCP: Genia Kettering, MD  Patient coming from: [facility] ED  Chief Complaint: Palpitations  HPI: [name] [name] is a 58 y.o. female with medical history significant of type 2 diabetes, hyperlipidemia, peripartum cardiomyopathy, asthma, migraine headaches, anxiety, depression, GERD presenting with a chief complaint of heart palpitations.  Patient states for the past few days she has had runny nose, sinus congestion, headaches, and cough.  No fevers.  Yesterday she was sitting when all of a sudden she started having heart palpitations but was not dizzy at that time and not having any chest pain.  She reports history of cardiomyopathy several years ago during pregnancy.  She does consume a lot of caffeinated beverages every day including 6 diet Dr. Kathlene Paradise sodas and 2 cups of tea.  States she was started on a weight loss drug 2 months ago but has not taken it for the past 2 weeks.  She is currently on Glyxambi , metformin , and repaglinide  for her diabetes.  Patient states she had a bowl of cereal just before she went into the emergency room.  Denies recent steroid use.  No other complaints.  ED Course: EKG done and noted to be in SVT on arrival with rate in the 170s.  Converted to sinus tachycardia with rate in the 110-120 range after adenosine 12 mg.  She was also given a dose of IV metoprolol 5 mg.  Labs showing WBC count 12.0, hemoglobin 13.8, glucose 578, bicarb 19, anion gap 17, beta hydroxybutyric acid normal x 2, UA negative for ketones and signs of infection, VBG with normal pH, serum osmolarity 317, potassium and magnesium within normal range, COVID/influenza/RSV PCR negative.  Chest x-ray showing no acute intrathoracic process. Patient was also given IV magnesium 2 g and 1.8 L LR boluses.  Repeat BMP showing glucose 357, bicarb 21, anion gap 11.  Review of Systems:  Review of Systems  All other systems  reviewed and are negative.   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 Her smoking use included cigarettes. She has a 7.5 pack-year smoking history. She has never used smokeless tobacco. She reports that she does not drink alcohol and does not use drugs.  No Known Allergies  Family History  Problem Relation Age of Onset   Diabetes Mother    Hypertension Mother    Inflammatory bowel disease Maternal Aunt    Colon cancer Paternal Aunt    Colon polyps Maternal Grandmother    Hypertension Other    Liver cancer Neg Hx    Pancreatic cancer Neg Hx    Rectal cancer Neg Hx    Stomach cancer Neg Hx    Esophageal cancer Neg Hx     Prior to Admission medications   Medication Sig Start Date End Date Taking? Authorizing Provider  albuterol  (VENTOLIN  HFA) 108 (90 Base) MCG/ACT  inhaler Inhale 2 puffs into the lungs every 4 (four) hours as needed for wheezing or shortness of breath. 01/06/23   [name], Aleksei V, MD  ALPRAZolam  (XANAX ) 0.5 MG tablet TAKE 1 TABLET(0.5 MG) BY MOUTH TWICE DAILY 01/06/23   [name], Aleksei V, MD  aspirin  81 MG chewable tablet Chew 1 tablet (81 mg total) by mouth daily. 05/19/17   [name], Oakley Bellman, MD  atorvastatin  (LIPITOR) 10 MG tablet TAKE 1 TABLET BY MOUTH DAILY AT 6 PM 03/10/23   [name], Aleksei V, MD  buPROPion  (WELLBUTRIN  SR) 150 MG 12 hr tablet TAKE 1 TABLET BY MOUTH TWICE DAILY 03/02/24   [name], Aleksei V, MD  Cholecalciferol  (EQL VITAMIN D3) 1000 units tablet Take 1 tablet (1,000 Units total) by mouth daily. 05/19/17    [name], Oakley Bellman, MD  citalopram  (CELEXA ) 40 MG tablet TAKE 1 TABLET(40 MG) BY MOUTH DAILY 09/15/23   [name], Oakley Bellman, MD  gabapentin  (NEURONTIN ) 100 MG capsule TAKE 1 CAPSULE(100 MG) BY MOUTH THREE TIMES DAILY 01/29/24   [name], Aleksei V, MD  GLYXAMBI  25-5 MG TABS TAKE 1 TABLET BY MOUTH DAILY 03/02/24   [name], Aleksei V, MD  metFORMIN  (GLUCOPHAGE ) 500 MG tablet TAKE 2 TABLETS TWICE DAILY WITH FOOD 04/28/24   [name], Aleksei V, MD  mupirocin  ointment (BACTROBAN ) 2 % On leg wound w/dressing change qd or bid 05/08/23   [name], Aleksei V, MD  ondansetron  (ZOFRAN  ODT) 4 MG disintegrating tablet Take 1 tablet (4 mg total) by mouth every 8 (eight) hours as needed for nausea or vomiting. 01/06/23   [name], Oakley Bellman, MD  pantoprazole  (PROTONIX ) 40 MG tablet TAKE 1 TABLET(40 MG) BY MOUTH TWICE DAILY 01/27/24   [name], Aleksei V, MD  phentermine  (ADIPEX-P ) 37.5 MG tablet Take 1 tablet (37.5 mg total) by mouth daily before breakfast. 12/02/23   [name], Aleksei V, MD  potassium chloride  SA (KLOR-CON  M) 20 MEQ tablet Take 1 tablet (20 mEq total) by mouth daily. 10/25/23   [name], Oakley Bellman, MD  repaglinide  (PRANDIN ) 2 MG tablet TAKE 1 TABLET BY MOUTH THREE TIMES DAILY BEFORE MEALS 02/01/24   [name], Oakley Bellman, MD    Physical Exam: Vitals:   05/14/24 2300 05/14/24 2330 05/15/24 0106 05/15/24 0121  BP: (!) 116/59 129/67  136/67  Pulse:    97  Resp: 20 18  18   Temp:    97.8 F (36.6 C)  TempSrc:    Oral  SpO2:    97%  Weight:   93.5 kg   Height:   5\' 8"  (1.727 m)     Physical Exam Vitals reviewed.  Constitutional:      General: She is not in acute distress. HENT:     Head: Normocephalic and atraumatic.  Eyes:     Extraocular Movements: Extraocular movements intact.  Cardiovascular:     Rate and Rhythm: Normal rate and regular rhythm.     Pulses: Normal pulses.  Pulmonary:     Effort: Pulmonary effort is normal. No respiratory distress.     Breath sounds: Normal  breath sounds. No wheezing or rales.  Abdominal:     General: Bowel sounds are normal. There is no distension.     Palpations: Abdomen is soft.     Tenderness: There is no abdominal tenderness. There is no guarding.  Musculoskeletal:     Cervical back: Normal range of motion.     Right lower leg: No edema.     Left lower leg: No edema.  Skin:  General: Skin is warm and dry.  Neurological:     General: No focal deficit present.     Mental Status: She is alert and oriented to person, place, and time.     Labs on Admission: I have personally reviewed following labs and imaging studies  CBC: Recent Labs  Lab 05/11/24 1458 05/14/24 2018 05/14/24 2217  WBC 8.8 12.0*  --   NEUTROABS 6.0 8.7*  --   HGB 13.4 13.8 12.9  HCT 39.0 42.5 38.0  MCV 86.7 86.7  --   PLT 214 234  --    Basic Metabolic Panel: Recent Labs  Lab 05/14/24 2018 05/14/24 2217 05/14/24 2351  NA 131* 135 135  K 4.2 4.1 4.1  CL 96*  --  103  CO2 19*  --  21*  GLUCOSE 578*  --  357*  BUN 10  --  9  CREATININE 0.75  --  0.61  CALCIUM  10.0  --  8.7*  MG 2.0  --   --    GFR: Estimated Creatinine Clearance: 95 mL/min (by C-G formula based on SCr of 0.61 mg/dL). Liver Function Tests: Recent Labs  Lab 05/14/24 2018  AST 32  ALT 32  ALKPHOS 115  BILITOT 0.5  PROT 7.6  ALBUMIN 4.4   No results for input(s): "LIPASE", "AMYLASE" in the last 168 hours. No results for input(s): "AMMONIA" in the last 168 hours. Coagulation Profile: No results for input(s): "INR", "PROTIME" in the last 168 hours. Cardiac Enzymes: No results for input(s): "CKTOTAL", "CKMB", "CKMBINDEX", "TROPONINI" in the last 168 hours. BNP (last 3 results) No results for input(s): "PROBNP" in the last 8760 hours. HbA1C: No results for input(s): "HGBA1C" in the last 72 hours. CBG: Recent Labs  Lab 05/14/24 2211 05/14/24 2349 05/15/24 0324  GLUCAP 428* 351* 220*   Lipid Profile: No results for input(s): "CHOL", "HDL", "LDLCALC",  "TRIG", "CHOLHDL", "LDLDIRECT" in the last 72 hours. Thyroid  Function Tests: No results for input(s): "TSH", "T4TOTAL", "FREET4", "T3FREE", "THYROIDAB" in the last 72 hours. Anemia Panel: No results for input(s): "VITAMINB12", "FOLATE", "FERRITIN", "TIBC", "IRON ", "RETICCTPCT" in the last 72 hours. Urine analysis:    Component Value Date/Time   COLORURINE COLORLESS (A) 05/14/2024 2216   APPEARANCEUR CLEAR 05/14/2024 2216   LABSPEC 1.037 (H) 05/14/2024 2216   PHURINE 5.5 05/14/2024 2216   GLUCOSEU >1,000 (A) 05/14/2024 2216   GLUCOSEU >=1000 (A) 08/11/2019 0742   HGBUR NEGATIVE 05/14/2024 2216   BILIRUBINUR NEGATIVE 05/14/2024 2216   KETONESUR NEGATIVE 05/14/2024 2216   PROTEINUR NEGATIVE 05/14/2024 2216   UROBILINOGEN 0.2 08/11/2019 0742   NITRITE NEGATIVE 05/14/2024 2216   LEUKOCYTESUR NEGATIVE 05/14/2024 2216    Radiological Exams on Admission: DG Chest Port 1 View Result Date: 05/14/2024 CLINICAL DATA:  Cough, tachycardia, chest pressure EXAM: PORTABLE CHEST 1 VIEW COMPARISON:  01/03/2023 FINDINGS: Single frontal view of the chest demonstrates an unremarkable cardiac silhouette. No acute airspace disease, effusion, or pneumothorax. No acute bony abnormalities. IMPRESSION: 1. No acute intrathoracic process. Electronically Signed   By: Bobbye Burrow M.D.   On: 05/14/2024 20:42    Assessment and Plan  SVT In the setting of recent URI/sinusitis and patient also reports drinking a lot of caffeinated beverages daily.  Phentermine  listed in her home medications but patient states she has not taken it for the past 2 weeks.  PE less likely given no hypoxia or clinical signs of DVT.  Received adenosine and IV metoprolol in the ED.  Currently in sinus rhythm.  Electrolytes normal.  Check TSH and D-dimer.  UDS ordered.  Echocardiogram ordered to assess for structural heart disease.  Continue cardiac monitoring and consult cardiology in the morning.  Hyperglycemia Poorly controlled type 2  diabetes Last A1c 7.8 in October 2024.  Presented with severe hyperglycemia with glucose initially in the 570s but labs not indicative of DKA or HHS.  She given IV fluid boluses in the ED but not started on insulin  drip.  After IV fluids, glucose is now down to 220.  Discussed starting low-dose long-acting insulin  but patient is hesitant.  She is okay with receiving sliding scale insulin  during this hospitalization but wants to follow-up with her PCP for further management of her diabetes.  Hold home oral hypoglycemic agents at this time.  Repeat A1c ordered and placed on sensitive sliding scale insulin  [facility].  Asthma: Stable, no signs of acute exacerbation. Anxiety and depression Hyperlipidemia GERD Pharmacy med rec pending.  DVT prophylaxis: Lovenox Code Status: Full Code (discussed with the patient) Family Communication: No family available at this time. Level of care: Progressive Care Unit  Juliette Oh MD [facility]  If 7PM-7AM, please contact night-coverage [email or web address]  05/15/2024, 4:13 AM

## 2024-05-15 NOTE — Progress Notes (Addendum)
 PROGRESS NOTE    [name] [name]  UVO:[id number] DOB: Jul 26, 1966 DOA: 05/14/2024 PCP: Genia Kettering, MD   58/F w longstanding uncontrolled type 2 diabetes, hyperlipidemia, remote peripartum cardiomyopathy which resolved, asthma, migraine headaches, anxiety, depression, GERD presented to ED with acute onset of palpitations on 5/24.  Preceded by URI few days. Consumes large number of diet sodas daily. -In the ED she was noted to be in SVT with heart rate in the 170s, given adenosine, improved  - Initial blood glucose was 578 , improved after insulin  and fluids, mild metabolic acidosis with elevated anion gap on admission which quickly closed  Subjective: -Feels okay, reports recent URI followed by palpitations yesterday  Assessment and Plan:  SVT - sp adenosine in the ED overnight, history of recent URI, excessive caffeine consumption, also started on phentermine  in December, allegedly has not been taking it for the last few weeks -TSH is pending -Echo with cardiomyopathy and wall motion abnormality -Add metoprolol  Systolic CHF -Echo with EF 40-45% with wall motion abnormality -Raises concern for ischemic cardiomyopathy considering risk factors -Will request cardiology input, clinically appears euvolemic at this time   Hyperglycemia Poorly controlled type 2 diabetes -Presented with severe hyperglycemia with glucose initially in the 570s, mild acidosis which resolved, - No evidence of DKA on repeat labs, beta hydroxybutyrate acid was negative, ketones negative and urine -Repeat labs pending this morning - A1c is 10.1, she is on multiple oral hypoglycemics and reluctant to start insulin  at discharge, wants to discuss options with her PCP this week - Add [facility] for now as inpatient, diabetes coordinator consult   Asthma: Stable, no signs of acute exacerbation.  Anxiety and depression -Restart Wellbutrin , Celexa   GERD -Restart PPI     DVT prophylaxis: Lovenox Code  Status: Full code Family Communication: None present Disposition Plan: Home pending above workup  Consultants:    Procedures:   Antimicrobials:    Objective: Vitals:   05/15/24 0106 05/15/24 0121 05/15/24 0444 05/15/24 0726  BP:  136/67 (!) 148/76 (!) 142/67  Pulse:  97 100 96  Resp:  18  18  Temp:  97.8 F (36.6 C) 97.7 F (36.5 C) 98.2 F (36.8 C)  TempSrc:  Oral Oral Oral  SpO2:  97% 95% 95%  Weight: 93.5 kg     Height: 5\' 8"  (1.727 m)       Intake/Output Summary (Last 24 hours) at 05/15/2024 1111 Last data filed at 05/15/2024 0730 Gross per 24 hour  Intake 1584.19 ml  Output --  Net 1584.19 ml   Filed Weights   05/14/24 2024 05/15/24 0106  Weight: 93 kg 93.5 kg    Examination:  General exam: Appears calm and comfortable  Respiratory system: Clear to auscultation Cardiovascular system: S1 & S2 heard, RRR.  Abd: nondistended, soft and nontender.Normal bowel sounds heard. Central nervous system: Alert and oriented. No focal neurological deficits. Extremities: no edema Skin: No rashes Psychiatry:  Mood & affect appropriate.     Data Reviewed:   CBC: Recent Labs  Lab 05/11/24 1458 05/14/24 2018 05/14/24 2217 05/15/24 0810  WBC 8.8 12.0*  --  6.8  NEUTROABS 6.0 8.7*  --   --   HGB 13.4 13.8 12.9 11.7*  HCT 39.0 42.5 38.0 37.2  MCV 86.7 86.7  --  87.9  PLT 214 234  --  155   Basic Metabolic Panel: Recent Labs  Lab 05/14/24 2018 05/14/24 2217 05/14/24 2351  NA 131* 135 135  K 4.2 4.1 4.1  CL 96*  --  103  CO2 19*  --  21*  GLUCOSE 578*  --  357*  BUN 10  --  9  CREATININE 0.75  --  0.61  CALCIUM  10.0  --  8.7*  MG 2.0  --   --    GFR: Estimated Creatinine Clearance: 95 mL/min (by C-G formula based on SCr of 0.61 mg/dL). Liver Function Tests: Recent Labs  Lab 05/14/24 2018  AST 32  ALT 32  ALKPHOS 115  BILITOT 0.5  PROT 7.6  ALBUMIN 4.4   No results for input(s): "LIPASE", "AMYLASE" in the last 168 hours. No results for  input(s): "AMMONIA" in the last 168 hours. Coagulation Profile: No results for input(s): "INR", "PROTIME" in the last 168 hours. Cardiac Enzymes: No results for input(s): "CKTOTAL", "CKMB", "CKMBINDEX", "TROPONINI" in the last 168 hours. BNP (last 3 results) No results for input(s): "PROBNP" in the last 8760 hours. HbA1C: Recent Labs    05/15/24 0810  HGBA1C 10.1*   CBG: Recent Labs  Lab 05/14/24 2211 05/14/24 2349 05/15/24 0324 05/15/24 0605  GLUCAP 428* 351* 220* 247*   Lipid Profile: No results for input(s): "CHOL", "HDL", "LDLCALC", "TRIG", "CHOLHDL", "LDLDIRECT" in the last 72 hours. Thyroid  Function Tests: No results for input(s): "TSH", "T4TOTAL", "FREET4", "T3FREE", "THYROIDAB" in the last 72 hours. Anemia Panel: No results for input(s): "VITAMINB12", "FOLATE", "FERRITIN", "TIBC", "IRON ", "RETICCTPCT" in the last 72 hours. Urine analysis:    Component Value Date/Time   COLORURINE COLORLESS (A) 05/14/2024 2216   APPEARANCEUR CLEAR 05/14/2024 2216   LABSPEC 1.037 (H) 05/14/2024 2216   PHURINE 5.5 05/14/2024 2216   GLUCOSEU >1,000 (A) 05/14/2024 2216   GLUCOSEU >=1000 (A) 08/11/2019 0742   HGBUR NEGATIVE 05/14/2024 2216   BILIRUBINUR NEGATIVE 05/14/2024 2216   KETONESUR NEGATIVE 05/14/2024 2216   PROTEINUR NEGATIVE 05/14/2024 2216   UROBILINOGEN 0.2 08/11/2019 0742   NITRITE NEGATIVE 05/14/2024 2216   LEUKOCYTESUR NEGATIVE 05/14/2024 2216   Sepsis Labs: @LABRCNTIP (procalcitonin:4,lacticidven:4)  ) Recent Results (from the past 240 hours)  Resp panel by RT-PCR (RSV, Flu A&B, Covid) Anterior Nasal Swab     Status: None   Collection Time: 05/14/24  8:18 PM   Specimen: Anterior Nasal Swab  Result Value Ref Range Status   SARS Coronavirus 2 by RT PCR NEGATIVE NEGATIVE Final    Comment: (NOTE) SARS-CoV-2 target nucleic acids are NOT DETECTED.  The SARS-CoV-2 RNA is generally detectable in upper respiratory specimens during the acute phase of infection. The  lowest concentration of SARS-CoV-2 viral copies this assay can detect is 138 copies/mL. A negative result does not preclude SARS-Cov-2 infection and should not be used as the sole basis for treatment or other patient management decisions. A negative result may occur with  improper specimen collection/handling, submission of specimen other than nasopharyngeal swab, presence of viral mutation(s) within the areas targeted by this assay, and inadequate number of viral copies(<138 copies/mL). A negative result must be combined with clinical observations, patient history, and epidemiological information. The expected result is Negative.  Fact Sheet for Patients:  BloggerCourse.com  Fact Sheet for Healthcare Providers:  SeriousBroker.it  This test is no t yet approved or cleared by the [place]  FDA and  has been authorized for detection and/or diagnosis of SARS-CoV-2 by FDA under an Emergency Use Authorization (EUA). This EUA will remain  in effect (meaning this test can be used) for the duration of the COVID-19 declaration under Section 564(b)(1) of the Act, 21 U.S.C.section 360bbb-3(b)(1), unless the  authorization is terminated  or revoked sooner.       Influenza A by PCR NEGATIVE NEGATIVE Final   Influenza B by PCR NEGATIVE NEGATIVE Final    Comment: (NOTE) The Xpert Xpress SARS-CoV-2/FLU/RSV plus assay is intended as an aid in the diagnosis of influenza from Nasopharyngeal swab specimens and should not be used as a sole basis for treatment. Nasal washings and aspirates are unacceptable for Xpert Xpress SARS-CoV-2/FLU/RSV testing.  Fact Sheet for Patients: BloggerCourse.com  Fact Sheet for Healthcare Providers: SeriousBroker.it  This test is not yet approved or cleared by the [place]  FDA and has been authorized for detection and/or diagnosis of SARS-CoV-2 by FDA under  an Emergency Use Authorization (EUA). This EUA will remain in effect (meaning this test can be used) for the duration of the COVID-19 declaration under Section 564(b)(1) of the Act, 21 U.S.C. section 360bbb-3(b)(1), unless the authorization is terminated or revoked.     Resp Syncytial Virus by PCR NEGATIVE NEGATIVE Final    Comment: (NOTE) Fact Sheet for Patients: BloggerCourse.com  Fact Sheet for Healthcare Providers: SeriousBroker.it  This test is not yet approved or cleared by the [place]  FDA and has been authorized for detection and/or diagnosis of SARS-CoV-2 by FDA under an Emergency Use Authorization (EUA). This EUA will remain in effect (meaning this test can be used) for the duration of the COVID-19 declaration under Section 564(b)(1) of the Act, 21 U.S.C. section 360bbb-3(b)(1), unless the authorization is terminated or revoked.  Performed at [facility], [address], [place], [place] [address]      Radiology Studies: ECHOCARDIOGRAM COMPLETE Result Date: 05/15/2024    ECHOCARDIOGRAM REPORT   Patient Name:   DAMIKA HARMON Date of Exam: 05/15/2024 Medical Rec #:  [id number]       Height:       68.0 in Accession #:    [id number]      Weight:       206.1 lb Date of Birth:  [birth year]      BSA:          2.070 m Patient Age:    57 years        BP:           142/67 mmHg Patient Gender: F               HR:           101 bpm. Exam Location:  Inpatient Procedure: 2D Echo, Cardiac Doppler, Color Doppler and Intracardiac            Opacification Agent (Both Spectral and Color Flow Doppler were            utilized during procedure). Indications:    Other abnormalities of the heart R00.8  History:        Patient has no prior history of Echocardiogram examinations.                 CHF; Risk Factors:Diabetes.  Sonographer:    Astrid Blamer Referring Phys: [id number] VASUNDHRA RATHORE IMPRESSIONS  1. Akinesis of the  distal anteroseptal wall and apex with overall mild to moderate LV dysfunction.  2. Left ventricular ejection fraction, by estimation, is 40 to 45%. The left ventricle has mild to moderately decreased function. The left ventricle demonstrates regional wall motion abnormalities (see scoring diagram/findings for description). There is  mild left ventricular hypertrophy. Left ventricular diastolic parameters are consistent with Grade I diastolic dysfunction (impaired relaxation).  3. Right ventricular systolic  function is normal. The right ventricular size is normal.  4. The mitral valve is normal in structure. Trivial mitral valve regurgitation. No evidence of mitral stenosis.  5. The aortic valve is tricuspid. Aortic valve regurgitation is not visualized. Aortic valve sclerosis is present, with no evidence of aortic valve stenosis. Comparison(s): No prior Echocardiogram. FINDINGS  Left Ventricle: Left ventricular ejection fraction, by estimation, is 40 to 45%. The left ventricle has mild to moderately decreased function. The left ventricle demonstrates regional wall motion abnormalities. Definity contrast agent was given IV to delineate the left ventricular endocardial borders. The left ventricular internal cavity size was normal in size. There is mild left ventricular hypertrophy. Left ventricular diastolic parameters are consistent with Grade I diastolic dysfunction (impaired relaxation). Right Ventricle: The right ventricular size is normal. Right ventricular systolic function is normal. Left Atrium: Left atrial size was normal in size. Right Atrium: Right atrial size was normal in size. Pericardium: There is no evidence of pericardial effusion. Mitral Valve: The mitral valve is normal in structure. Trivial mitral valve regurgitation. No evidence of mitral valve stenosis. Tricuspid Valve: The tricuspid valve is normal in structure. Tricuspid valve regurgitation is trivial. No evidence of tricuspid stenosis. Aortic  Valve: The aortic valve is tricuspid. Aortic valve regurgitation is not visualized. Aortic valve sclerosis is present, with no evidence of aortic valve stenosis. Aortic valve mean gradient measures 7.0 mmHg. Aortic valve peak gradient measures 13.7 mmHg. Aortic valve area, by VTI measures 1.87 cm. Pulmonic Valve: The pulmonic valve was normal in structure. Pulmonic valve regurgitation is not visualized. No evidence of pulmonic stenosis. Aorta: The aortic root is normal in size and structure. Venous: The inferior vena cava was not well visualized. IAS/Shunts: The interatrial septum is aneurysmal. The interatrial septum was not well visualized. Additional Comments: Akinesis of the distal anteroseptal wall and apex with overall mild to moderate LV dysfunction.  LEFT VENTRICLE PLAX 2D LVIDd:         4.70 cm   Diastology LVIDs:         3.30 cm   LV e' medial:    7.07 cm/s LV PW:         1.30 cm   LV E/e' medial:  11.6 LV IVS:        1.40 cm   LV e' lateral:   6.64 cm/s LVOT diam:     2.00 cm   LV E/e' lateral: 12.3 LV SV:         70 LV SV Index:   34 LVOT Area:     3.14 cm  RIGHT VENTRICLE RV S prime:     14.50 cm/s TAPSE (M-mode): 2.5 cm LEFT ATRIUM             Index        RIGHT ATRIUM           Index LA Vol (A2C):   38.2 ml 18.45 ml/m  RA Area:     11.80 cm LA Vol (A4C):   32.6 ml 15.75 ml/m  RA Volume:   23.60 ml  11.40 ml/m LA Biplane Vol: 35.9 ml 17.34 ml/m  AORTIC VALVE AV Area (Vmax):    1.78 cm AV Area (Vmean):   1.88 cm AV Area (VTI):     1.87 cm AV Vmax:           185.00 cm/s AV Vmean:          131.000 cm/s AV VTI:  0.373 m AV Peak Grad:      13.7 mmHg AV Mean Grad:      7.0 mmHg LVOT Vmax:         105.00 cm/s LVOT Vmean:        78.300 cm/s LVOT VTI:          0.222 m LVOT/AV VTI ratio: 0.60  AORTA Ao Root diam: 2.50 cm MITRAL VALVE MV Area (PHT): 7.22 cm     SHUNTS MV Decel Time: 105 msec     Systemic VTI:  0.22 m MV E velocity: 81.90 cm/s   Systemic Diam: 2.00 cm MV A velocity: 147.00  cm/s MV E/A ratio:  0.56 Alexandria Angel MD Electronically signed by Alexandria Angel MD Signature Date/Time: 05/15/2024/11:00:03 AM    Final    DG Chest Port 1 View Result Date: 05/14/2024 CLINICAL DATA:  Cough, tachycardia, chest pressure EXAM: PORTABLE CHEST 1 VIEW COMPARISON:  01/03/2023 FINDINGS: Single frontal view of the chest demonstrates an unremarkable cardiac silhouette. No acute airspace disease, effusion, or pneumothorax. No acute bony abnormalities. IMPRESSION: 1. No acute intrathoracic process. Electronically Signed   By: Bobbye Burrow M.D.   On: 05/14/2024 20:42     Scheduled Meds:  enoxaparin (LOVENOX) injection  40 mg Subcutaneous Q24H   insulin  aspart  0-5 Units Subcutaneous QHS   insulin  aspart  0-9 Units Subcutaneous TID WC   metoprolol tartrate  25 mg Oral BID   Continuous Infusions:   LOS: 0 days    Time spent:    Deforest Fast, MD [facility]   05/15/2024, 11:11 AM

## 2024-05-17 LAB — HIV-1/2 AB - DIFFERENTIATION

## 2024-05-18 ENCOUNTER — Telehealth: Payer: Self-pay | Admitting: *Deleted

## 2024-05-18 LAB — HIV ANTIBODY (ROUTINE TESTING W REFLEX): HIV Screen 4th Generation wRfx: REACTIVE — AB

## 2024-05-18 NOTE — Transitions of Care (Post Inpatient/ED Visit) (Signed)
 05/18/2024  Name: [name] [name] MRN: [id number] DOB: Aug 05, 1966  Today's TOC FU Call Status: Today's TOC FU Call Status:: Successful TOC FU Call Completed TOC FU Call Complete Date: 05/18/24 Patient's Name and Date of Birth confirmed.  Transition Care Management Follow-up Telephone Call Date of Discharge: 05/15/24 (TOC referral received 05/17/24) Discharge Facility: [facility]) Type of Discharge: Inpatient Admission Primary Inpatient Discharge Diagnosis:: SVT secondary to presumed high intake of caffeine How have you been since you were released from the hospital?: Better ("I am doing fine now, they just kept me in the ER overnight.  I am independent, handle all of my own care.  Trying to get things under control, but I am fine doing it on my own, I don't need any one calling to check on me") Any questions or concerns?: No  Items Reviewed: Did you receive and understand the discharge instructions provided?: Yes (thoroughly reviewed with patient who verbalizes good understanding of same) Medications obtained,verified, and reconciled?: Yes (Medications Reviewed) (Full medication reconciliation/ review completed; no concerns or discrepancies identified; confirmed patient obtained/ is taking all newly Rx'd medications as instructed; self-manages medications and denies questions/ concerns around medications today) Any new allergies since your discharge?: No Dietary orders reviewed?: Yes Type of Diet Ordered:: "Healthy as possible; I am trying to get back on track with not eating carbs and sugars" Do you have support at home?: Yes People in Home [RPT]: spouse Name of Support/Comfort Primary Source: Reports independent in self-care activities; resides with supportive spouse- assists as/ if needed/ indicated  Medications Reviewed Today: Medications Reviewed Today     Reviewed by Zayveon Raschke M, RN (Registered Nurse) on 05/18/24 at 1517  Med List Status: <None>   Medication Order  Taking? Sig Documenting Provider Last Dose Status Informant  albuterol  (VENTOLIN  HFA) 108 (90 Base) MCG/ACT inhaler [id number] No Inhale 2 puffs into the lungs every 4 (four) hours as needed for wheezing or shortness of breath.  Patient not taking: Reported on 05/18/2024   [name], Oakley Bellman, MD Not Taking Active   ALPRAZolam  (XANAX ) 0.5 MG tablet [id number] Yes TAKE 1 TABLET(0.5 MG) BY MOUTH TWICE DAILY [name], Aleksei V, MD Taking Active   aspirin  81 MG chewable tablet [id number] Yes Chew 1 tablet (81 mg total) by mouth daily. [name], Aleksei V, MD Taking Active   atorvastatin  (LIPITOR) 10 MG tablet [id number] Yes TAKE 1 TABLET BY MOUTH DAILY AT 6 PM [name], Aleksei V, MD Taking Active   buPROPion  (WELLBUTRIN  SR) 150 MG 12 hr tablet [id number] Yes TAKE 1 TABLET BY MOUTH TWICE DAILY [name], Aleksei V, MD Taking Active   Cholecalciferol  (EQL VITAMIN D3) 1000 units tablet [id number] Yes Take 1 tablet (1,000 Units total) by mouth daily. [name], Aleksei V, MD Taking Active   citalopram  (CELEXA ) 40 MG tablet [id number] Yes TAKE 1 TABLET(40 MG) BY MOUTH DAILY [name], Aleksei V, MD Taking Active   gabapentin  (NEURONTIN ) 100 MG capsule [id number] Yes TAKE 1 CAPSULE(100 MG) BY MOUTH THREE TIMES DAILY [name], Aleksei V, MD Taking Active   GLYXAMBI  25-5 MG TABS [id number] Yes TAKE 1 TABLET BY MOUTH DAILY [name], Aleksei V, MD Taking Active   losartan (COZAAR) 25 MG tablet [id number] Yes Take 1 tablet (25 mg total) by mouth daily. Deforest Fast, MD Taking Active   metFORMIN  (GLUCOPHAGE ) 500 MG tablet [id number] Yes TAKE 2 TABLETS TWICE DAILY WITH FOOD [name], Aleksei V, MD Taking Active   metoprolol succinate (TOPROL XL) 25 MG 24 hr tablet [id number] Yes Take 1  tablet (25 mg total) by mouth daily. Deforest Fast, MD Taking Active   mupirocin  ointment (BACTROBAN ) 2 % [id number] No On leg wound w/dressing change qd or bid  Patient not taking: Reported on 05/18/2024   [name], Oakley Bellman, MD Not  Taking Active   ondansetron  (ZOFRAN  ODT) 4 MG disintegrating tablet [id number] No Take 1 tablet (4 mg total) by mouth every 8 (eight) hours as needed for nausea or vomiting.  Patient not taking: Reported on 05/18/2024   [name], Oakley Bellman, MD Not Taking Active   pantoprazole  (PROTONIX ) 40 MG tablet [id number] Yes TAKE 1 TABLET(40 MG) BY MOUTH TWICE DAILY [name], Aleksei V, MD Taking Active   repaglinide  (PRANDIN ) 2 MG tablet [id number] Yes TAKE 1 TABLET BY MOUTH THREE TIMES DAILY BEFORE MEALS [name], Aleksei V, MD Taking Active            Home Care and Equipment/Supplies: Were Home Health Services Ordered?: No Any new equipment or medical supplies ordered?: No  Functional Questionnaire: Do you need assistance with bathing/showering or dressing?: No Do you need assistance with meal preparation?: No Do you need assistance with eating?: No Do you have difficulty maintaining continence: No Do you need assistance with getting out of bed/getting out of a chair/moving?: No Do you have difficulty managing or taking your medications?: No  Follow up appointments reviewed: PCP Follow-up appointment confirmed?: Yes Date of PCP follow-up appointment?: 05/20/24 Follow-up Provider: PCP Specialist Hospital Follow-up appointment confirmed?: Yes Date of Specialist follow-up appointment?: 06/10/24 (verified this is recommended time frame for follow up per hospital discharging provider notes) Follow-Up Specialty Provider:: cardiology provider Do you need transportation to your follow-up appointment?: No Do you understand care options if your condition(s) worsen?: Yes-patient verbalized understanding  SDOH Interventions Today    Flowsheet Row Most Recent Value  SDOH Interventions   Food Insecurity Interventions Intervention Not Indicated  Housing Interventions Intervention Not Indicated  Transportation Interventions Intervention Not Indicated  [drives self]  Utilities Interventions  Intervention Not Indicated      Patient declines need for ongoing/ further care management/ coordination outreach; declines enrollment in 30-day TOC program- declines taking my direct phone number should needs/ concerns arise post-TOC call   See TOC assessment tabs for additional assessment/ TOC intervention information  Pls call/ message for questions,  Saylor Murry Mckinney Empress Newmann, RN, BSN, Media planner  Transitions of Care  [facility] Population [facility]  [place] [phone]: direct office

## 2024-05-20 ENCOUNTER — Ambulatory Visit (INDEPENDENT_AMBULATORY_CARE_PROVIDER_SITE_OTHER): Admitting: Internal Medicine

## 2024-05-20 ENCOUNTER — Encounter: Payer: Self-pay | Admitting: Internal Medicine

## 2024-05-20 VITALS — BP 118/78 | HR 90 | Temp 98.6°F | Ht 68.0 in | Wt 207.0 lb

## 2024-05-20 DIAGNOSIS — Z7985 Long-term (current) use of injectable non-insulin antidiabetic drugs: Secondary | ICD-10-CM

## 2024-05-20 DIAGNOSIS — I471 Supraventricular tachycardia, unspecified: Secondary | ICD-10-CM

## 2024-05-20 DIAGNOSIS — E1169 Type 2 diabetes mellitus with other specified complication: Secondary | ICD-10-CM

## 2024-05-20 DIAGNOSIS — I509 Heart failure, unspecified: Secondary | ICD-10-CM

## 2024-05-20 DIAGNOSIS — E669 Obesity, unspecified: Secondary | ICD-10-CM

## 2024-05-20 DIAGNOSIS — F419 Anxiety disorder, unspecified: Secondary | ICD-10-CM | POA: Diagnosis not present

## 2024-05-20 DIAGNOSIS — R5383 Other fatigue: Secondary | ICD-10-CM

## 2024-05-20 DIAGNOSIS — E1142 Type 2 diabetes mellitus with diabetic polyneuropathy: Secondary | ICD-10-CM

## 2024-05-20 MED ORDER — CITALOPRAM HYDROBROMIDE 40 MG PO TABS: 40.0000 mg | ORAL_TABLET | Freq: Every day | ORAL | 3 refills | Status: AC

## 2024-05-20 MED ORDER — DILTIAZEM HCL 30 MG PO TABS: 30.0000 mg | ORAL_TABLET | Freq: Three times a day (TID) | ORAL | 0 refills | Status: DC | PRN

## 2024-05-20 MED ORDER — FREESTYLE LIBRE 3 SENSOR MISC: 1.0000 [IU] | 3 refills | Status: DC

## 2024-05-20 MED ORDER — ATORVASTATIN CALCIUM 10 MG PO TABS: ORAL_TABLET | ORAL | 3 refills | Status: DC

## 2024-05-20 MED ORDER — ALPRAZOLAM 0.5 MG PO TABS: ORAL_TABLET | ORAL | 0 refills | Status: DC

## 2024-05-20 MED ORDER — OZEMPIC (0.25 OR 0.5 MG/DOSE) 2 MG/3ML ~~LOC~~ SOPN: PEN_INJECTOR | SUBCUTANEOUS | 5 refills | Status: DC

## 2024-05-20 NOTE — Assessment & Plan Note (Addendum)
 No relapse The patient had a very unpleasant reaction to intravenous adenosine  Cardizem  30 mg tid prn Appt w/Dr Carolynne Citron - June 2025

## 2024-05-20 NOTE — Progress Notes (Signed)
 Subjective:  Patient ID: [name] [name], female    DOB: 1966/04/15  Age: 58 y.o. MRN: [id number]  CC: Medical Management of Chronic Issues (Pt has concerns about recent hospital stay for heart issues, trouble hearing and recent A1c levels)   HPI RIELLE SCHLAUCH presents for SVT, DM w/high glucose, CHF  Per hx:  "Admit date: 05/14/2024 Discharge date: 05/15/2024   Time spent: 35 minutes   Recommendations for Outpatient Follow-up:  Dr.Gregg Carolynne Citron in 3 weeks Outpatient coronary CTA to rule out CAD PCP in 1 week, has uncontrolled diabetes     Discharge Diagnoses:  Principal Problem:   SVT (supraventricular tachycardia) (HCC) Cardiomyopathy, systolic CHF Uncontrolled type 2 diabetes mellitus with hyperglycemia   Anxiety disorder   Asthma, chronic   GERD (gastroesophageal reflux disease)   Hyperglycemia     Discharge Condition: Improved   Diet recommendation:, Heart healthy       Filed Weights    05/14/24 2024 05/15/24 0106  Weight: 93 kg 93.5 kg      History of present illness:  57/F w longstanding uncontrolled type 2 diabetes, hyperlipidemia, remote peripartum cardiomyopathy which resolved, asthma, migraine headaches, anxiety, depression, GERD presented to ED with acute onset of palpitations on 5/24.  Preceded by URI few days. Consumes large number of diet sodas daily. -In the ED she was noted to be in SVT with heart rate in the 170s, given adenosine , improved  - Initial blood glucose was 578 , improved after insulin  and fluids, mild metabolic acidosis with elevated anion gap on admission which quickly closed   Hospital Course:   SVT - sp adenosine  in the ED overnight, history of recent URI, excessive caffeine consumption, also started on phentermine  in December, allegedly has not been taking it for the last few weeks -TSH was normal -Echo with cardiomyopathy and wall motion abnormality - Started on Toprol , seen by EP, recommended follow-up in few weeks    Systolic CHF -Echo with EF 40-45% with wall motion abnormality -Raises concern for ischemic cardiomyopathy considering risk factors - Seen by cardiology Dr. Carolynne Citron in consultation who felt her LV dysfunction could be related to phentermine  SVT or CAD, repeat recommended low-dose Toprol , losartan  and follow-up in clinic for coronary CTA as outpatient in addition to considering catheter ablation   Hyperglycemia Poorly controlled type 2 diabetes -Presented with severe hyperglycemia with glucose initially in the 570s, mild acidosis which resolved, - No evidence of DKA on repeat labs, beta hydroxybutyrate acid was negative, ketones negative and urine - A1c is 10.1, she is on multiple oral hypoglycemics and reluctant to start insulin  at discharge, wants to discuss options with her PCP this week - Seen by diabetes coordinator and consult, Home regimen of metformin , repaglinide  and Glyxambi  resumed   Asthma: Stable, no signs of acute exacerbation.   Anxiety and depression -Restart Wellbutrin , Celexa    GERD -Restart PPI   Consultations: Cardiology Dr. Carolynne Citron   Discharge Exam:     Vitals:    05/15/24 0726 05/15/24 1200  BP: (!) 142/67 (!) 116/96  Pulse: 96 81  Resp: 18 18  Temp: 98.2 F (36.8 C) 98.6 F (37 C)  SpO2: 95% 96%    Gen: Awake, Alert, Oriented X 3,  HEENT: no JVD Lungs: Good air movement bilaterally, CTAB CVS: S1S2/RRR Abd: soft, Non tender, non distended, BS present Extremities: No edema Skin: no new rashes on exposed skin"    Outpatient Medications Prior to Visit  Medication Sig Dispense Refill   aspirin  81  MG chewable tablet Chew 1 tablet (81 mg total) by mouth daily. 90 tablet 1   buPROPion  (WELLBUTRIN  SR) 150 MG 12 hr tablet TAKE 1 TABLET BY MOUTH TWICE DAILY 180 tablet 1   Cholecalciferol  (EQL VITAMIN D3) 1000 units tablet Take 1 tablet (1,000 Units total) by mouth daily. 90 tablet 1   gabapentin  (NEURONTIN ) 100 MG capsule TAKE 1 CAPSULE(100 MG) BY MOUTH  THREE TIMES DAILY 90 capsule 5   GLYXAMBI  25-5 MG TABS TAKE 1 TABLET BY MOUTH DAILY 90 tablet 3   losartan  (COZAAR ) 25 MG tablet Take 1 tablet (25 mg total) by mouth daily. 30 tablet 1   metFORMIN  (GLUCOPHAGE ) 500 MG tablet TAKE 2 TABLETS TWICE DAILY WITH FOOD 360 tablet 0   metoprolol  succinate (TOPROL  XL) 25 MG 24 hr tablet Take 1 tablet (25 mg total) by mouth daily. 30 tablet 2   pantoprazole  (PROTONIX ) 40 MG tablet TAKE 1 TABLET(40 MG) BY MOUTH TWICE DAILY 180 tablet 3   repaglinide  (PRANDIN ) 2 MG tablet TAKE 1 TABLET BY MOUTH THREE TIMES DAILY BEFORE MEALS 270 tablet 3   ALPRAZolam  (XANAX ) 0.5 MG tablet TAKE 1 TABLET(0.5 MG) BY MOUTH TWICE DAILY 180 tablet 0   atorvastatin  (LIPITOR) 10 MG tablet TAKE 1 TABLET BY MOUTH DAILY AT 6 PM 90 tablet 3   citalopram  (CELEXA ) 40 MG tablet TAKE 1 TABLET(40 MG) BY MOUTH DAILY 90 tablet 3   albuterol  (VENTOLIN  HFA) 108 (90 Base) MCG/ACT inhaler Inhale 2 puffs into the lungs every 4 (four) hours as needed for wheezing or shortness of breath. (Patient not taking: Reported on 05/20/2024) 8 g 3   mupirocin  ointment (BACTROBAN ) 2 % On leg wound w/dressing change qd or bid (Patient not taking: Reported on 05/20/2024) 60 g 1   ondansetron  (ZOFRAN  ODT) 4 MG disintegrating tablet Take 1 tablet (4 mg total) by mouth every 8 (eight) hours as needed for nausea or vomiting. (Patient not taking: Reported on 05/20/2024) 20 tablet 0   No facility-administered medications prior to visit.    ROS: Review of Systems  Constitutional:  Positive for fatigue. Negative for activity change, appetite change, chills and unexpected weight change.  HENT:  Negative for congestion, mouth sores and sinus pressure.   Eyes:  Negative for visual disturbance.  Respiratory:  Negative for cough, chest tightness, shortness of breath and wheezing.   Cardiovascular:  Positive for palpitations. Negative for leg swelling.  Gastrointestinal:  Negative for abdominal pain and nausea.  Genitourinary:   Negative for difficulty urinating, frequency and vaginal pain.  Musculoskeletal:  Positive for arthralgias and back pain. Negative for gait problem.  Skin:  Negative for pallor and rash.  Neurological:  Negative for dizziness, tremors, weakness, numbness and headaches.  Psychiatric/Behavioral:  Negative for confusion and sleep disturbance.     Objective:  BP 118/78   Pulse 90   Temp 98.6 F (37 C) (Oral)   Ht 5\' 8"  (1.727 m)   Wt 207 lb (93.9 kg)   LMP 03/02/2017   SpO2 94%   BMI 31.47 kg/m   BP Readings from Last 3 Encounters:  05/20/24 118/78  05/15/24 (!) 116/96  01/19/24 133/74    Wt Readings from Last 3 Encounters:  05/20/24 207 lb (93.9 kg)  05/15/24 206 lb 2.1 oz (93.5 kg)  01/19/24 205 lb (93 kg)    Physical Exam Constitutional:      General: She is not in acute distress.    Appearance: She is well-developed.  HENT:  Head: Normocephalic.     Right Ear: External ear normal.     Left Ear: External ear normal.     Nose: Nose normal.  Eyes:     General:        Right eye: No discharge.        Left eye: No discharge.     Conjunctiva/sclera: Conjunctivae normal.     Pupils: Pupils are equal, round, and reactive to light.  Neck:     Thyroid : No thyromegaly.     Vascular: No JVD.     Trachea: No tracheal deviation.  Cardiovascular:     Rate and Rhythm: Regular rhythm. Tachycardia present.     Heart sounds: Normal heart sounds.  Pulmonary:     Effort: No respiratory distress.     Breath sounds: No stridor. No wheezing.  Abdominal:     General: Bowel sounds are normal. There is no distension.     Palpations: Abdomen is soft. There is no mass.     Tenderness: There is no abdominal tenderness. There is no guarding or rebound.  Musculoskeletal:        General: No tenderness.     Cervical back: Normal range of motion and neck supple. No rigidity.  Lymphadenopathy:     Cervical: No cervical adenopathy.  Skin:    Findings: No erythema or rash.   Neurological:     Cranial Nerves: No cranial nerve deficit.     Motor: No abnormal muscle tone.     Coordination: Coordination normal.     Deep Tendon Reflexes: Reflexes normal.  Psychiatric:        Behavior: Behavior normal.        Thought Content: Thought content normal.        Judgment: Judgment normal.     Lab Results  Component Value Date   WBC 6.8 05/15/2024   HGB 11.7 (L) 05/15/2024   HCT 37.2 05/15/2024   PLT 155 05/15/2024   GLUCOSE 278 (H) 05/15/2024   CHOL 156 08/11/2019   TRIG (H) 08/11/2019    554.0 Triglyceride is over 400; calculations on Lipids are invalid.   HDL 28.10 (L) 08/11/2019   LDLDIRECT 69.0 08/11/2019   LDLCALC 97 10/23/2017   ALT 32 05/14/2024   AST 32 05/14/2024   NA 138 05/15/2024   K 3.9 05/15/2024   CL 107 05/15/2024   CREATININE 0.47 05/15/2024   BUN 7 05/15/2024   CO2 22 05/15/2024   TSH 1.397 05/15/2024   HGBA1C 10.1 (H) 05/15/2024   MICROALBUR 2.9 (H) 07/31/2020    ECHOCARDIOGRAM COMPLETE Result Date: 05/15/2024    ECHOCARDIOGRAM REPORT   Patient Name:   PRABHLEEN MONTEMAYOR Date of Exam: 05/15/2024 Medical Rec #:  [id number]       Height:       68.0 in Accession #:    [id number]      Weight:       206.1 lb Date of Birth:  Oct 04, 1966      BSA:          2.070 m Patient Age:    57 years        BP:           142/67 mmHg Patient Gender: F               HR:           101 bpm. Exam Location:  Inpatient Procedure: 2D Echo, Cardiac Doppler, Color Doppler and Intracardiac  Opacification Agent (Both Spectral and Color Flow Doppler were            utilized during procedure). Indications:    Other abnormalities of the heart R00.8  History:        Patient has no prior history of Echocardiogram examinations.                 CHF; Risk Factors:Diabetes.  Sonographer:    Astrid Blamer Referring Phys: [id number] VASUNDHRA RATHORE IMPRESSIONS  1. Akinesis of the distal anteroseptal wall and apex with overall mild to moderate LV dysfunction.  2. Left  ventricular ejection fraction, by estimation, is 40 to 45%. The left ventricle has mild to moderately decreased function. The left ventricle demonstrates regional wall motion abnormalities (see scoring diagram/findings for description). There is  mild left ventricular hypertrophy. Left ventricular diastolic parameters are consistent with Grade I diastolic dysfunction (impaired relaxation).  3. Right ventricular systolic function is normal. The right ventricular size is normal.  4. The mitral valve is normal in structure. Trivial mitral valve regurgitation. No evidence of mitral stenosis.  5. The aortic valve is tricuspid. Aortic valve regurgitation is not visualized. Aortic valve sclerosis is present, with no evidence of aortic valve stenosis. Comparison(s): No prior Echocardiogram. FINDINGS  Left Ventricle: Left ventricular ejection fraction, by estimation, is 40 to 45%. The left ventricle has mild to moderately decreased function. The left ventricle demonstrates regional wall motion abnormalities. Definity  contrast agent was given IV to delineate the left ventricular endocardial borders. The left ventricular internal cavity size was normal in size. There is mild left ventricular hypertrophy. Left ventricular diastolic parameters are consistent with Grade I diastolic dysfunction (impaired relaxation). Right Ventricle: The right ventricular size is normal. Right ventricular systolic function is normal. Left Atrium: Left atrial size was normal in size. Right Atrium: Right atrial size was normal in size. Pericardium: There is no evidence of pericardial effusion. Mitral Valve: The mitral valve is normal in structure. Trivial mitral valve regurgitation. No evidence of mitral valve stenosis. Tricuspid Valve: The tricuspid valve is normal in structure. Tricuspid valve regurgitation is trivial. No evidence of tricuspid stenosis. Aortic Valve: The aortic valve is tricuspid. Aortic valve regurgitation is not visualized.  Aortic valve sclerosis is present, with no evidence of aortic valve stenosis. Aortic valve mean gradient measures 7.0 mmHg. Aortic valve peak gradient measures 13.7 mmHg. Aortic valve area, by VTI measures 1.87 cm. Pulmonic Valve: The pulmonic valve was normal in structure. Pulmonic valve regurgitation is not visualized. No evidence of pulmonic stenosis. Aorta: The aortic root is normal in size and structure. Venous: The inferior vena cava was not well visualized. IAS/Shunts: The interatrial septum is aneurysmal. The interatrial septum was not well visualized. Additional Comments: Akinesis of the distal anteroseptal wall and apex with overall mild to moderate LV dysfunction.  LEFT VENTRICLE PLAX 2D LVIDd:         4.70 cm   Diastology LVIDs:         3.30 cm   LV e' medial:    7.07 cm/s LV PW:         1.30 cm   LV E/e' medial:  11.6 LV IVS:        1.40 cm   LV e' lateral:   6.64 cm/s LVOT diam:     2.00 cm   LV E/e' lateral: 12.3 LV SV:         70 LV SV Index:   34 LVOT Area:     3.14  cm  RIGHT VENTRICLE RV S prime:     14.50 cm/s TAPSE (M-mode): 2.5 cm LEFT ATRIUM             Index        RIGHT ATRIUM           Index LA Vol (A2C):   38.2 ml 18.45 ml/m  RA Area:     11.80 cm LA Vol (A4C):   32.6 ml 15.75 ml/m  RA Volume:   23.60 ml  11.40 ml/m LA Biplane Vol: 35.9 ml 17.34 ml/m  AORTIC VALVE AV Area (Vmax):    1.78 cm AV Area (Vmean):   1.88 cm AV Area (VTI):     1.87 cm AV Vmax:           185.00 cm/s AV Vmean:          131.000 cm/s AV VTI:            0.373 m AV Peak Grad:      13.7 mmHg AV Mean Grad:      7.0 mmHg LVOT Vmax:         105.00 cm/s LVOT Vmean:        78.300 cm/s LVOT VTI:          0.222 m LVOT/AV VTI ratio: 0.60  AORTA Ao Root diam: 2.50 cm MITRAL VALVE MV Area (PHT): 7.22 cm     SHUNTS MV Decel Time: 105 msec     Systemic VTI:  0.22 m MV E velocity: 81.90 cm/s   Systemic Diam: 2.00 cm MV A velocity: 147.00 cm/s MV E/A ratio:  0.56 Alexandria Angel MD Electronically signed by Alexandria Angel MD  Signature Date/Time: 05/15/2024/11:00:03 AM    Final    DG Chest Port 1 View Result Date: 05/14/2024 CLINICAL DATA:  Cough, tachycardia, chest pressure EXAM: PORTABLE CHEST 1 VIEW COMPARISON:  01/03/2023 FINDINGS: Single frontal view of the chest demonstrates an unremarkable cardiac silhouette. No acute airspace disease, effusion, or pneumothorax. No acute bony abnormalities. IMPRESSION: 1. No acute intrathoracic process. Electronically Signed   By: Bobbye Burrow M.D.   On: 05/14/2024 20:42    Assessment & Plan:   Problem List Items Addressed This Visit     Diabetes mellitus type 2 in obese - Primary    The patient was not able to get her Ozempic  filled for 6 weeks due to shortage. Not using Ozempic  x 1 year (it was denied). Will restart in 05/2024 hopefully  Occ Hypoglycemia - will use Libre      Relevant Medications   Semaglutide ,0.25 or 0.5MG /DOS, (OZEMPIC , 0.25 OR 0.5 MG/DOSE,) 2 MG/3ML SOPN   atorvastatin  (LIPITOR) 10 MG tablet   Anxiety disorder   Continue with citalopram  and Wellbutrin  Continue with alprazolam  as needed  Potential benefits of a long term benzodiazepines  use as well as potential risks  and complications were explained to the patient and were aknowledged.      Relevant Medications   citalopram  (CELEXA ) 40 MG tablet   ALPRAZolam  (XANAX ) 0.5 MG tablet   CHF (congestive heart failure) (HCC)   Related to recent PSVT.  Probably compensated      Relevant Medications   atorvastatin  (LIPITOR) 10 MG tablet   diltiazem  (CARDIZEM ) 30 MG tablet   Fatigue   Relevant Medications   citalopram  (CELEXA ) 40 MG tablet   Diabetic neuropathy (HCC)   Relevant Medications   Semaglutide ,0.25 or 0.5MG /DOS, (OZEMPIC , 0.25 OR 0.5 MG/DOSE,) 2 MG/3ML SOPN   atorvastatin  (LIPITOR) 10  MG tablet   citalopram  (CELEXA ) 40 MG tablet   SVT (supraventricular tachycardia) (HCC)   No relapse The patient had a very unpleasant reaction to intravenous adenosine  Cardizem  30 mg tid prn Appt  w/Dr Carolynne Citron - June 2025      Relevant Medications   atorvastatin  (LIPITOR) 10 MG tablet   diltiazem  (CARDIZEM ) 30 MG tablet      Meds ordered this encounter  Medications   Semaglutide ,0.25 or 0.5MG /DOS, (OZEMPIC , 0.25 OR 0.5 MG/DOSE,) 2 MG/3ML SOPN    Sig: Use 0.25 mg weekly sq for 1 month, then 0.5 mg sq weekly    Dispense:  3 mL    Refill:  5   atorvastatin  (LIPITOR) 10 MG tablet    Sig: TAKE 1 TABLET BY MOUTH DAILY AT 6 PM    Dispense:  90 tablet    Refill:  3   citalopram  (CELEXA ) 40 MG tablet    Sig: Take 1 tablet (40 mg total) by mouth daily.    Dispense:  90 tablet    Refill:  3   ALPRAZolam  (XANAX ) 0.5 MG tablet    Sig: TAKE 1 TABLET(0.5 MG) BY MOUTH TWICE DAILY    Dispense:  180 tablet    Refill:  0   Continuous Glucose Sensor (FREESTYLE LIBRE 3 SENSOR) MISC    Sig: 1 Units by Does not apply route every 14 (fourteen) days.    Dispense:  6 each    Refill:  3   diltiazem  (CARDIZEM ) 30 MG tablet    Sig: Take 1 tablet (30 mg total) by mouth 3 (three) times daily as needed.    Dispense:  90 tablet    Refill:  0      Follow-up: Return in about 6 weeks (around 07/01/2024) for a follow-up visit.  Anitra Barn, MD

## 2024-05-20 NOTE — Assessment & Plan Note (Signed)
Continue with citalopram and Wellbutrin Continue with alprazolam as needed  Potential benefits of a long term benzodiazepines  use as well as potential risks  and complications were explained to the patient and were aknowledged. 

## 2024-05-20 NOTE — Assessment & Plan Note (Addendum)
 The patient was not able to get her Ozempic  filled for 6 weeks due to shortage. Not using Ozempic  x 1 year (it was denied). Will restart in 05/2024 hopefully  Occ Hypoglycemia - will use Libre

## 2024-05-23 ENCOUNTER — Telehealth: Payer: Self-pay

## 2024-05-23 ENCOUNTER — Other Ambulatory Visit (HOSPITAL_COMMUNITY): Payer: Self-pay

## 2024-05-23 ENCOUNTER — Encounter: Payer: Self-pay | Admitting: Oncology

## 2024-05-23 NOTE — Assessment & Plan Note (Signed)
 Related to recent PSVT.  Probably compensated

## 2024-05-23 NOTE — Telephone Encounter (Signed)
 Pharmacy Patient Advocate Encounter   Received notification from CoverMyMeds that prior authorization for Ozempic  2 is required/requested.   Insurance verification completed.   The patient is insured through [facility] .   Per test claim: PA required; PA submitted to above mentioned insurance via CoverMyMeds Key/confirmation #/EOC [facility] Status is pending

## 2024-05-23 NOTE — Telephone Encounter (Signed)
 Pharmacy Patient Advocate Encounter  Received notification from [facility] that Prior Authorization for Ozempic  2 has been APPROVED from 05/23/24 to 05/24/27. Unable to obtain price due to refill too soon rejection, last fill date 05/23/24 next available fill date 06/17/24   PA #/Case ID/Reference #: [facility]

## 2024-05-24 ENCOUNTER — Other Ambulatory Visit (HOSPITAL_COMMUNITY): Payer: Self-pay

## 2024-05-26 ENCOUNTER — Telehealth: Admitting: Physician Assistant

## 2024-05-26 ENCOUNTER — Telehealth: Admitting: Family Medicine

## 2024-05-26 ENCOUNTER — Encounter: Payer: Self-pay | Admitting: Internal Medicine

## 2024-05-26 DIAGNOSIS — J069 Acute upper respiratory infection, unspecified: Secondary | ICD-10-CM

## 2024-05-26 NOTE — Progress Notes (Signed)
 [place]   Recent SVT with several other illnesses, has reports of LRI In person was recommended given recent ED- to hospital admission for SVT  Patient acknowledged agreement and understanding of the plan.

## 2024-05-26 NOTE — Progress Notes (Signed)
  Because of your symptoms and recent hospitalization, I feel your condition warrants further evaluation and I recommend that you be seen in a face-to-face visit.   NOTE: There will be NO CHARGE for this E-Visit   If you are having a true medical emergency, please call 911.     For an urgent face to face visit, [place] has multiple urgent care centers for your convenience.  Click the link below for the full list of locations and hours, walk-in wait times, appointment scheduling options and driving directions:  Urgent Care - [place], [place], [place], [place], [place], [place]  [place]     Your MyChart E-visit questionnaire answers were reviewed by a board certified advanced clinical practitioner to complete your personal care plan based on your specific symptoms.    Thank you for using e-Visits.

## 2024-05-28 ENCOUNTER — Other Ambulatory Visit: Payer: Self-pay | Admitting: Internal Medicine

## 2024-05-28 MED ORDER — CEFDINIR 300 MG PO CAPS
300.0000 mg | ORAL_CAPSULE | Freq: Two times a day (BID) | ORAL | 0 refills | Status: DC
Start: 2024-05-28 — End: 2024-07-04

## 2024-06-08 ENCOUNTER — Inpatient Hospital Stay: Payer: BC Managed Care – PPO

## 2024-06-10 ENCOUNTER — Ambulatory Visit: Attending: Internal Medicine | Admitting: Internal Medicine

## 2024-06-10 ENCOUNTER — Encounter: Payer: Self-pay | Admitting: Internal Medicine

## 2024-06-10 ENCOUNTER — Inpatient Hospital Stay: Attending: Physician Assistant

## 2024-06-10 VITALS — BP 140/60 | HR 102 | Ht 67.0 in | Wt 200.0 lb

## 2024-06-10 DIAGNOSIS — I471 Supraventricular tachycardia, unspecified: Secondary | ICD-10-CM

## 2024-06-10 DIAGNOSIS — Z01812 Encounter for preprocedural laboratory examination: Secondary | ICD-10-CM

## 2024-06-10 DIAGNOSIS — R072 Precordial pain: Secondary | ICD-10-CM

## 2024-06-10 DIAGNOSIS — D509 Iron deficiency anemia, unspecified: Secondary | ICD-10-CM | POA: Diagnosis present

## 2024-06-10 DIAGNOSIS — I509 Heart failure, unspecified: Secondary | ICD-10-CM

## 2024-06-10 LAB — CBC WITH DIFFERENTIAL (CANCER CENTER ONLY)
Abs Immature Granulocytes: 0.08 10*3/uL — ABNORMAL HIGH (ref 0.00–0.07)
Basophils Absolute: 0.1 10*3/uL (ref 0.0–0.1)
Basophils Relative: 1 %
Eosinophils Absolute: 0.3 10*3/uL (ref 0.0–0.5)
Eosinophils Relative: 2 %
HCT: 43.4 % (ref 36.0–46.0)
Hemoglobin: 13.6 g/dL (ref 12.0–15.0)
Immature Granulocytes: 1 %
Lymphocytes Relative: 26 %
Lymphs Abs: 2.9 10*3/uL (ref 0.7–4.0)
MCH: 26.9 pg (ref 26.0–34.0)
MCHC: 31.3 g/dL (ref 30.0–36.0)
MCV: 85.8 fL (ref 80.0–100.0)
Monocytes Absolute: 0.5 10*3/uL (ref 0.1–1.0)
Monocytes Relative: 5 %
Neutro Abs: 7.3 10*3/uL (ref 1.7–7.7)
Neutrophils Relative %: 65 %
Platelet Count: 277 10*3/uL (ref 150–400)
RBC: 5.06 MIL/uL (ref 3.87–5.11)
RDW: 14 % (ref 11.5–15.5)
WBC Count: 11.1 10*3/uL — ABNORMAL HIGH (ref 4.0–10.5)
nRBC: 0 % (ref 0.0–0.2)

## 2024-06-10 LAB — IRON AND IRON BINDING CAPACITY (CC-WL,HP ONLY)
Iron: 58 ug/dL (ref 28–170)
Saturation Ratios: 14 % (ref 10.4–31.8)
TIBC: 405 ug/dL (ref 250–450)
UIBC: 347 ug/dL (ref 148–442)

## 2024-06-10 LAB — FERRITIN: Ferritin: 31 ng/mL (ref 11–307)

## 2024-06-10 MED ORDER — METOPROLOL TARTRATE 100 MG PO TABS: 100.0000 mg | ORAL_TABLET | Freq: Once | ORAL | 0 refills | Status: DC

## 2024-06-10 MED ORDER — METOPROLOL SUCCINATE ER 25 MG PO TB24: 50.0000 mg | ORAL_TABLET | Freq: Every day | ORAL | 2 refills | Status: DC

## 2024-06-10 NOTE — Progress Notes (Signed)
 HPI [name] is a pleasant 58 yo woman with a h/o DM. She has a h/o peripartum CM and saw Dr. Audery Blazing over 10 years ago. He EF improved. She has had variably controlled DM. She was treated with phenteramine for weight loss. She stopped this. She had sustained palpitations lasting several hours  back in May and presented with SVT at 175/min. She was treated with IV adenosine . She was found to have mild LV dysfunction and placed on GDMT. She has done fairly well on toprol  but has still had a couple of break thru episodes of palpitations and heart rates of 150-160. She does not have angina but does have atypical pain.  Allergies  Allergen Reactions   Adenosine      SOB and pressure for 20 seconds     Current Outpatient Medications  Medication Sig Dispense Refill   ALPRAZolam  (XANAX ) 0.5 MG tablet TAKE 1 TABLET(0.5 MG) BY MOUTH TWICE DAILY 180 tablet 0   aspirin  81 MG chewable tablet Chew 1 tablet (81 mg total) by mouth daily. 90 tablet 1   atorvastatin  (LIPITOR) 10 MG tablet TAKE 1 TABLET BY MOUTH DAILY AT 6 PM 90 tablet 3   buPROPion  (WELLBUTRIN  SR) 150 MG 12 hr tablet TAKE 1 TABLET BY MOUTH TWICE DAILY 180 tablet 1   Cholecalciferol  (EQL VITAMIN D3) 1000 units tablet Take 1 tablet (1,000 Units total) by mouth daily. 90 tablet 1   citalopram  (CELEXA ) 40 MG tablet Take 1 tablet (40 mg total) by mouth daily. 90 tablet 3   Continuous Glucose Sensor (FREESTYLE LIBRE 3 SENSOR) MISC 1 Units by Does not apply route every 14 (fourteen) days. 6 each 3   diltiazem  (CARDIZEM ) 30 MG tablet Take 1 tablet (30 mg total) by mouth 3 (three) times daily as needed. 90 tablet 0   gabapentin  (NEURONTIN ) 100 MG capsule TAKE 1 CAPSULE(100 MG) BY MOUTH THREE TIMES DAILY 90 capsule 5   GLYXAMBI  25-5 MG TABS TAKE 1 TABLET BY MOUTH DAILY 90 tablet 3   losartan  (COZAAR ) 25 MG tablet Take 1 tablet (25 mg total) by mouth daily. 30 tablet 1   metFORMIN  (GLUCOPHAGE ) 500 MG tablet TAKE 2 TABLETS TWICE DAILY WITH FOOD  360 tablet 0   metoprolol  succinate (TOPROL  XL) 25 MG 24 hr tablet Take 1 tablet (25 mg total) by mouth daily. 30 tablet 2   pantoprazole  (PROTONIX ) 40 MG tablet TAKE 1 TABLET(40 MG) BY MOUTH TWICE DAILY 180 tablet 3   repaglinide  (PRANDIN ) 2 MG tablet TAKE 1 TABLET BY MOUTH THREE TIMES DAILY BEFORE MEALS 270 tablet 3   Semaglutide ,0.25 or 0.5MG /DOS, (OZEMPIC , 0.25 OR 0.5 MG/DOSE,) 2 MG/3ML SOPN Use 0.25 mg weekly sq for 1 month, then 0.5 mg sq weekly 3 mL 5   albuterol  (VENTOLIN  HFA) 108 (90 Base) MCG/ACT inhaler Inhale 2 puffs into the lungs every 4 (four) hours as needed for wheezing or shortness of breath. (Patient not taking: Reported on 05/20/2024) 8 g 3   cefdinir  (OMNICEF ) 300 MG capsule Take 1 capsule (300 mg total) by mouth 2 (two) times daily. 20 capsule 0   mupirocin  ointment (BACTROBAN ) 2 % On leg wound w/dressing change qd or bid (Patient not taking: Reported on 05/20/2024) 60 g 1   ondansetron  (ZOFRAN  ODT) 4 MG disintegrating tablet Take 1 tablet (4 mg total) by mouth every 8 (eight) hours as needed for nausea or vomiting. (Patient not taking: Reported on 05/20/2024) 20 tablet 0   No current facility-administered medications  for this visit.     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   Past Surgical History:  Procedure Laterality Date   CESAREAN SECTION     x 2   CHOLECYSTECTOMY  2006     Family History  Problem Relation Age of Onset   Diabetes Mother    Hypertension Mother    Inflammatory bowel disease Maternal Aunt    Colon cancer Paternal Aunt    Colon polyps Maternal Grandmother    Hypertension Other     Liver cancer Neg Hx    Pancreatic cancer Neg Hx    Rectal cancer Neg Hx    Stomach cancer Neg Hx    Esophageal cancer Neg Hx      Social History   Socioeconomic History   Marital status: Married    Spouse name: [name] [name]   Number of children: 2   Years of education: Not on file   Highest education level: Bachelor's degree (e.g., BA, AB, BS)  Occupational History   Occupation: EC teacher    Employer: [employer]  Tobacco Use   Smoking status: Some Days    Current packs/day: 0.50    Average packs/day: 0.5 packs/day for 15.0 years (7.5 ttl pk-yrs)    Types: Cigarettes   Smokeless tobacco: Never   Tobacco comments:    info given 12/14/2019  Vaping Use   Vaping status: Never Used  Substance and Sexual Activity   Alcohol use: No   Drug use: No   Sexual activity: Yes    Birth control/protection: Post-menopausal  Other Topics Concern   Not on file  Social History Narrative   Not on file   Social Drivers of Health   Financial Resource Strain: Low Risk  (04/07/2023)   Overall Financial Resource Strain (CARDIA)    Difficulty of Paying Living Expenses: Not very hard  Food Insecurity: No Food Insecurity (05/18/2024)   Hunger Vital Sign    Worried About Running Out of Food in the Last Year: Never true    Ran Out of Food in the Last Year: Never true  Transportation Needs: No Transportation Needs (05/18/2024)   PRAPARE - Administrator, Civil Service (Medical): No    Lack of Transportation (Non-Medical): No  Physical Activity: Insufficiently Active (04/07/2023)   Exercise Vital Sign    Days of Exercise per Week: 1 day    Minutes of Exercise per Session: 10 min  Stress: Stress Concern Present (04/07/2023)   [employer] of Occupational Health - Occupational Stress Questionnaire    Feeling of Stress : To some extent  Social Connections: Moderately Integrated (04/07/2023)   Social Connection and Isolation Panel    Frequency of Communication with Friends and  Family: More than three times a week    Frequency of Social Gatherings with Friends and Family: Never    Attends Religious Services: 1 to 4 times per year    Active Member of [employer] or Organizations: No    Attends Banker Meetings: Not  on file    Marital Status: Married  Catering manager Violence: Not At Risk (05/18/2024)   Humiliation, Afraid, Rape, and Kick questionnaire    Fear of Current or Ex-Partner: No    Emotionally Abused: No    Physically Abused: No    Sexually Abused: No     BP (!) 140/60   Pulse (!) 102   Ht 5' 7 (1.702 m)   Wt 200 lb (90.7 kg)   LMP 03/02/2017   SpO2 96%   BMI 31.32 kg/m   Physical Exam:  Well appearing NAD HEENT: Unremarkable Neck:  No JVD, no thyromegally Lymphatics:  No adenopathy Back:  No CVA tenderness Lungs:  Clear with no wheezes HEART:  Regular rate rhythm, no murmurs, no rubs, no clicks Abd:  soft, positive bowel sounds, no organomegally, no rebound, no guarding Ext:  2 plus pulses, no edema, no cyanosis, no clubbing Skin:  No rashes no nodules Neuro:  CN II through XII intact, motor grossly intact  EKG - sinus tachy at 102 with RBBB and left axis.   Assess/Plan: Atypical chest pain and LV dysfunction - I will have her undergo coronary CT scanning. If abnormal Left heart cath.  SVT - As she has had recurrent symptoms and HR's in the 150-160 range, I'd suggest catheter ablation. I have discussed the indications/risks/benefits/goals/expectations and he wishes to proceed. We will perform as our schedule allows.  Pete Brand Alyvia Derk,MD

## 2024-06-10 NOTE — Patient Instructions (Addendum)
 Medication Instructions:  Your physician has recommended you make the following change in your medication:  Increase toprol  XL 50 mg daily  Lab Work: CBC and BMET-- Within 30 days.  You may go to any [facility] Location for your lab work:  [facility] - [address] ([facility]) - [address] [phone] [address]  [place] - [address]   [place]  - [address]   [place] - [address] - [address] [employer]  - [address] ([facility]   If you have labs (blood work) drawn today and your tests are completely normal, you will receive your results only by: [employer] (if you have MyChart)  If you have any lab test that is abnormal or we need to change your treatment, we will call you or send a MyChart message to review the results.  Testing/Procedures: Supraventricular tachycardia (SVT) Ablation to be scheduled July 30th.  Your physician has requested that you have cardiac CT. Cardiac computed tomography (CT) is a painless test that uses an x-ray machine to take clear, detailed pictures of your heart. For further information please visit [email or web address] Please follow instruction sheet as given.    Follow-Up: At [facility], you and your health needs are our priority.  As part of our continuing mission to provide you with exceptional heart care, we have created designated Provider Care Teams.  These Care Teams include your primary Cardiologist (physician) and Advanced Practice Providers (APPs -  Physician Assistants and Nurse Practitioners) who all work together to provide you with the care you need, when you need it.  Your next appointment:   To be scheduled  The format for your next appointment:   In Person  Provider:   Manya Sells, MD{or one of the following Advanced Practice Providers on your designated Care Team:    Mertha Abrahams, [place] Merla Starch, PA-C Neda Balk, NP   OTHER INSTRUCTIONS  Your cardiac CT will be scheduled at one of the below locations:   [facility] [address] [place], [place] [address] [phone]  OR  [facility] [address] [place], [place] [address] [phone]  OR   [facility] [address] [place], [place] [address] [phone]  OR   [facility] [address] [place], [place] [address] [phone]  OR   [facility] [address]  [place], [place] [address]  If scheduled at [facility], please arrive at the [facility] and Children's Entrance (Entrance C2) of [facility] 30 minutes prior to test start time. You can use the FREE valet parking offered at entrance C (encouraged to control the heart rate for the test)  Proceed to the [facility] Radiology Department (first floor) to check-in and test prep.   All radiology patients and guests should use entrance C2 at [facility], accessed from [facility], even though the hospital's physical address listed is [address]    If scheduled at the Heart and Vascular Tower at [address], please enter the parking lot using the [address] entrance and use the FREE valet service at the patient drop-off area. Enter the buidling and check-in with registration on the main floor.  If scheduled at [facility] or [facility], please arrive 15 mins early for check-in and test prep.  There is spacious parking and easy access to the radiology department from the [facility] entrance. Please enter here and check-in with the desk attendant.   If scheduled at [facility], please arrive 30 minutes early for check-in and test prep.  Please follow these  instructions carefully (unless otherwise directed):  An IV will be required for this test and Nitroglycerin  will be given.  Hold all erectile dysfunction medications at least 3 days (72 hrs) prior to test. (Ie viagra, cialis, sildenafil, tadalafil, etc)   On the Night Before the Test: Be sure to Drink plenty of water. Do not consume any caffeinated/decaffeinated beverages or chocolate 12 hours prior to your test. Do not take any antihistamines 12 hours prior to your test.  On the Day of the Test: Drink plenty of water until 1 hour prior to the test. Do not eat any food 1 hour prior to test. You may take your regular medications prior to the test.  Take metoprolol  (Lopressor ) 100 mg two hours prior to test. If you take Furosemide/Hydrochlorothiazide/Spironolactone/Chlorthalidone, please HOLD on the morning of the test. Patients who wear a continuous glucose monitor MUST remove the device prior to scanning. FEMALES- please wear underwire-free bra if available, avoid dresses & tight clothing          After the Test: Drink plenty of water. After receiving IV contrast, you may experience a mild flushed feeling. This is normal. On occasion, you may experience a mild rash up to 24 hours after the test. This is not dangerous. If this occurs, you can take Benadryl  25 mg, Zyrtec, Claritin, or Allegra and increase your fluid intake. (Patients taking Tikosyn should avoid Benadryl , and may take Zyrtec, Claritin, or Allegra) If you experience trouble breathing, this can be serious. If it is severe call [phone] IMMEDIATELY. If it is mild, please call our office.  We will call to schedule your test 2-4 weeks out understanding that some insurance companies will need an authorization prior to the service being performed.   For more information and frequently asked questions, please visit our website : [email or web address]  For non-scheduling related questions, please contact the cardiac imaging  nurse navigator should you have any questions/concerns: Cardiac Imaging Nurse Navigators Direct Office Dial: [phone]   For scheduling needs, including cancellations and rescheduling, please call [place], [phone].

## 2024-06-20 ENCOUNTER — Encounter (HOSPITAL_COMMUNITY): Payer: Self-pay

## 2024-06-21 ENCOUNTER — Telehealth (HOSPITAL_COMMUNITY): Payer: Self-pay | Admitting: *Deleted

## 2024-06-21 NOTE — Telephone Encounter (Signed)

## 2024-06-22 ENCOUNTER — Other Ambulatory Visit (HOSPITAL_BASED_OUTPATIENT_CLINIC_OR_DEPARTMENT_OTHER): Payer: Self-pay | Admitting: Internal Medicine

## 2024-06-22 ENCOUNTER — Ambulatory Visit (HOSPITAL_COMMUNITY)
Admission: RE | Admit: 2024-06-22 | Discharge: 2024-06-22 | Disposition: A | Discharge status: Home / Self Care | Source: Ambulatory Visit | Attending: Internal Medicine | Admitting: Internal Medicine

## 2024-06-22 ENCOUNTER — Ambulatory Visit (HOSPITAL_COMMUNITY)
Admission: RE | Admit: 2024-06-22 | Discharge: 2024-06-22 | Disposition: A | Discharge status: Home / Self Care | Source: Ambulatory Visit | Attending: Cardiology | Admitting: Cardiology

## 2024-06-22 ENCOUNTER — Ambulatory Visit (HOSPITAL_BASED_OUTPATIENT_CLINIC_OR_DEPARTMENT_OTHER): Payer: Self-pay | Admitting: Internal Medicine

## 2024-06-22 DIAGNOSIS — I2510 Atherosclerotic heart disease of native coronary artery without angina pectoris: Secondary | ICD-10-CM | POA: Diagnosis not present

## 2024-06-22 DIAGNOSIS — R072 Precordial pain: Secondary | ICD-10-CM | POA: Diagnosis present

## 2024-06-22 DIAGNOSIS — R931 Abnormal findings on diagnostic imaging of heart and coronary circulation: Secondary | ICD-10-CM | POA: Insufficient documentation

## 2024-06-22 MED ORDER — IOHEXOL 350 MG/ML SOLN
100.0000 mL | Freq: Once | INTRAVENOUS | Status: AC | PRN
  Administered 2024-06-22: 100 mL via INTRAVENOUS

## 2024-06-22 MED ORDER — NITROGLYCERIN 0.4 MG SL SUBL
0.8000 mg | SUBLINGUAL_TABLET | Freq: Once | SUBLINGUAL | Status: AC
  Administered 2024-06-22: 0.8 mg via SUBLINGUAL

## 2024-06-22 MED ORDER — METOPROLOL TARTRATE 5 MG/5ML IV SOLN: 10.0000 mg | Freq: Once | INTRAVENOUS | Status: DC | PRN

## 2024-06-22 MED ORDER — DILTIAZEM HCL 25 MG/5ML IV SOLN
10.0000 mg | INTRAVENOUS | Status: DC | PRN
Start: 2024-06-22 — End: 2024-06-23

## 2024-06-22 MED ORDER — NITROGLYCERIN 0.4 MG SL SUBL
SUBLINGUAL_TABLET | SUBLINGUAL | Status: AC
  Filled 2024-06-22: qty 30

## 2024-06-22 NOTE — Progress Notes (Signed)
CT sent for FFR 

## 2024-06-27 ENCOUNTER — Other Ambulatory Visit: Payer: Self-pay | Admitting: Medical Genetics

## 2024-07-04 ENCOUNTER — Ambulatory Visit: Admitting: Internal Medicine

## 2024-07-04 ENCOUNTER — Encounter: Payer: Self-pay | Admitting: Internal Medicine

## 2024-07-04 VITALS — BP 118/64 | HR 93 | Temp 98.3°F | Ht 67.0 in | Wt 199.0 lb

## 2024-07-04 DIAGNOSIS — I2510 Atherosclerotic heart disease of native coronary artery without angina pectoris: Secondary | ICD-10-CM

## 2024-07-04 DIAGNOSIS — E785 Hyperlipidemia, unspecified: Secondary | ICD-10-CM

## 2024-07-04 DIAGNOSIS — E669 Obesity, unspecified: Secondary | ICD-10-CM

## 2024-07-04 DIAGNOSIS — E1169 Type 2 diabetes mellitus with other specified complication: Secondary | ICD-10-CM

## 2024-07-04 DIAGNOSIS — I471 Supraventricular tachycardia, unspecified: Secondary | ICD-10-CM

## 2024-07-04 DIAGNOSIS — F419 Anxiety disorder, unspecified: Secondary | ICD-10-CM | POA: Diagnosis not present

## 2024-07-04 DIAGNOSIS — I2583 Coronary atherosclerosis due to lipid rich plaque: Secondary | ICD-10-CM

## 2024-07-04 DIAGNOSIS — I509 Heart failure, unspecified: Secondary | ICD-10-CM | POA: Diagnosis not present

## 2024-07-04 DIAGNOSIS — Z7985 Long-term (current) use of injectable non-insulin antidiabetic drugs: Secondary | ICD-10-CM

## 2024-07-04 LAB — BASIC METABOLIC PANEL WITH GFR
BUN/Creatinine Ratio: 18 (ref 9–23)
BUN: 13 mg/dL (ref 6–24)
CO2: 20 mmol/L (ref 20–29)
Calcium: 9.4 mg/dL (ref 8.7–10.2)
Chloride: 99 mmol/L (ref 96–106)
Creatinine, Ser: 0.74 mg/dL (ref 0.57–1.00)
Glucose: 183 mg/dL — ABNORMAL HIGH (ref 70–99)
Potassium: 4.7 mmol/L (ref 3.5–5.2)
Sodium: 138 mmol/L (ref 134–144)
eGFR: 95 mL/min/1.73 (ref >59)

## 2024-07-04 MED ORDER — SEMAGLUTIDE (1 MG/DOSE) 4 MG/3ML ~~LOC~~ SOPN: 1.0000 mg | PEN_INJECTOR | SUBCUTANEOUS | 3 refills | Status: DC

## 2024-07-04 NOTE — Assessment & Plan Note (Signed)
 Coronary calcium  CT ordered 03/2023  2025 /Coronary CTA Narrative & Impression  CLINICAL DATA:  58 yo female with chest pain, nonspecific LV dysfunction   EXAM: Cardiac/Coronary CTA   TECHNIQUE: A non-contrast, gated CT scan was obtained with axial slices of 2.5 mm through the heart for calcium  scoring. Calcium  scoring was performed using the Agatston method. A 120 kV prospective, gated, contrast cardiac CT scan was obtained. Gantry rotation speed was 230 msec and collimation was 0.63 mm. Two sublingual nitroglycerin  tablets (0.8 mg) were given. The 3D data set was reconstructed with motion correction for the best systolic or diastolic phase. Images were analyzed on a dedicated workstation using MPR, MIP, and VRT modes. The patient received 100mL OMNIPAQUE  IOHEXOL  350 MG/ML SOLN of contrast.   FINDINGS: Image quality: Excellent.   Noise artifact is: Limited.   Coronary arteries: Normal coronary origins.  Right dominance.   Right Coronary Artery: Dominant. Heavily calcified with at least moderate (50-69%) to possibly severe proximal stenosis. Small R-PLB and R-PDA branches which are calcified.   Left Main Coronary Artery: Calcified with at least mild 25-49% stenosis. Bifurcates into the LAD and LCx arteries.   Left Anterior Descending Coronary Artery: Large anterior artery that wraps around the apex. Heavily calcified with at least moderate 50-69% to possibly severe proximal mixed stenosis with low attenuation plaque features. The mid to distal vessel demonstrates spotty calcification with up to mild 25-49% stenosis.   Left Circumflex Artery: AV groove vessel with moderate concentric calcification of the mid vessel and the bifurcation of an OM branch. The OM branch does not appear diseased. The distal LCx tapers sharply consistent with severe 70-99% stenosis.   Aorta: Normal size, 32 mm at the mid ascending aorta (level of the PA bifurcation) measured double oblique.  Aortic atherosclerosis. No dissection.   Aortic Valve: Trileaflet. Mild calcifications (Aortic valve calcium  score of 95.4)   Other findings:   Normal pulmonary vein drainage into the left atrium.   Normal left atrial appendage without a thrombus.   Mildly dilated main pulmonary artery at 30 mm, suggestive of pulmonary hypertension.   Extra-cardiac findings: See attached radiology report for non-cardiac structures.   IMPRESSION: 1. Heavy coronary calcification with moderate to likely severe CAD, CADRADS = 4v. CT FFR will be performed and reported separately.   2. Normal coronary origin with right dominance.   3. Coronary artery calcium  score is 2781, which places the patient in the 99th percentile for age/race and sex-matched controls (MESA).   4. Total plaque volume 2026 mm3, of which 690 mm3 (34%) is calcified plaque and 1361mm3 (66%) is non-calcified plaque. 18 mm3 (1%) is considered low attenuation. TPV is extensive.   5. Aggressvie risk factor modification, including lipid lowering to LDL <50 if possible is recommended. Consider anti-inflammatory therapies such as colchicine and/or icosapent ethyl.   6. Cardiac catheterization may be warranted.   RECOMMENDATIONS: 1. CAD-RADS 0: No evidence of CAD (0%). Consider non-atherosclerotic causes of chest pain.   2. CAD-RADS 1: Minimal non-obstructive CAD (0-24%). Consider non-atherosclerotic causes of chest pain. Consider preventive therapy and risk factor modification.   3. CAD-RADS 2: Mild non-obstructive CAD (25-49%). Consider non-atherosclerotic causes of chest pain. Consider preventive therapy and risk factor modification.   4. CAD-RADS 3: Moderate stenosis. Consider symptom-guided anti-ischemic pharmacotherapy as well as risk factor modification per guideline directed care. Additional analysis with CT FFR will be submitted.   5. CAD-RADS 4: Severe stenosis. (70-99% or > 50% left main). Cardiac catheterization  or  CT FFR is recommended. Consider symptom-guided anti-ischemic pharmacotherapy as well as risk factor modification per guideline directed care. Invasive coronary angiography recommended with revascularization per published guideline statements.   6. CAD-RADS 5: Total coronary occlusion (100%). Consider cardiac catheterization or viability assessment. Consider symptom-guided anti-ischemic pharmacotherapy as well as risk factor modification per guideline directed care.   7. CAD-RADS N: Non-diagnostic study. Obstructive CAD can't be excluded. Alternative evaluation is recommended.   Electronically Signed: By: Vinie JAYSON Maxcy M.D. On: 06/22/2024 12:28

## 2024-07-04 NOTE — Progress Notes (Signed)
 Subjective:  Patient ID: [name] [name], female    DOB: 01-09-1966  Age: 58 y.o. MRN: [id number]  CC: Medical Management of Chronic Issues (6 week f/u, discuss increase with ozempic  and CT scan of Heart was done would like to know what wold be next steps for findings and would like findings to be discussed with her on it' meaning as pt has a ablation coming up and is concerned.)   HPI [name] [name] presents for DM, HTN, CHF  Outpatient Medications Prior to Visit  Medication Sig Dispense Refill   ALPRAZolam  (XANAX ) 0.5 MG tablet TAKE 1 TABLET(0.5 MG) BY MOUTH TWICE DAILY 180 tablet 0   aspirin  81 MG chewable tablet Chew 1 tablet (81 mg total) by mouth daily. 90 tablet 1   atorvastatin  (LIPITOR) 10 MG tablet TAKE 1 TABLET BY MOUTH DAILY AT 6 PM 90 tablet 3   buPROPion  (WELLBUTRIN  SR) 150 MG 12 hr tablet TAKE 1 TABLET BY MOUTH TWICE DAILY 180 tablet 1   Cholecalciferol  (EQL VITAMIN D3) 1000 units tablet Take 1 tablet (1,000 Units total) by mouth daily. 90 tablet 1   citalopram  (CELEXA ) 40 MG tablet Take 1 tablet (40 mg total) by mouth daily. 90 tablet 3   diltiazem  (CARDIZEM ) 30 MG tablet Take 1 tablet (30 mg total) by mouth 3 (three) times daily as needed. 90 tablet 0   gabapentin  (NEURONTIN ) 100 MG capsule TAKE 1 CAPSULE(100 MG) BY MOUTH THREE TIMES DAILY 90 capsule 5   GLYXAMBI  25-5 MG TABS TAKE 1 TABLET BY MOUTH DAILY 90 tablet 3   losartan  (COZAAR ) 25 MG tablet Take 1 tablet (25 mg total) by mouth daily. 30 tablet 1   metFORMIN  (GLUCOPHAGE ) 500 MG tablet TAKE 2 TABLETS TWICE DAILY WITH FOOD 360 tablet 0   metoprolol  succinate (TOPROL  XL) 25 MG 24 hr tablet Take 2 tablets (50 mg total) by mouth daily. 30 tablet 2   metoprolol  tartrate (LOPRESSOR ) 100 MG tablet Take 1 tablet (100 mg total) by mouth once for 1 dose. Take 2 hours prior to Cardiac CT 1 tablet 0   pantoprazole  (PROTONIX ) 40 MG tablet TAKE 1 TABLET(40 MG) BY MOUTH TWICE DAILY 180 tablet 3   repaglinide  (PRANDIN ) 2 MG tablet  TAKE 1 TABLET BY MOUTH THREE TIMES DAILY BEFORE MEALS 270 tablet 3   Continuous Glucose Sensor (FREESTYLE LIBRE 3 SENSOR) MISC 1 Units by Does not apply route every 14 (fourteen) days. 6 each 3   Semaglutide ,0.25 or 0.5MG /DOS, (OZEMPIC , 0.25 OR 0.5 MG/DOSE,) 2 MG/3ML SOPN Use 0.25 mg weekly sq for 1 month, then 0.5 mg sq weekly 3 mL 5   albuterol  (VENTOLIN  HFA) 108 (90 Base) MCG/ACT inhaler Inhale 2 puffs into the lungs every 4 (four) hours as needed for wheezing or shortness of breath. (Patient not taking: Reported on 05/20/2024) 8 g 3   cefdinir  (OMNICEF ) 300 MG capsule Take 1 capsule (300 mg total) by mouth 2 (two) times daily. 20 capsule 0   mupirocin  ointment (BACTROBAN ) 2 % On leg wound w/dressing change qd or bid (Patient not taking: Reported on 05/20/2024) 60 g 1   ondansetron  (ZOFRAN  ODT) 4 MG disintegrating tablet Take 1 tablet (4 mg total) by mouth every 8 (eight) hours as needed for nausea or vomiting. (Patient not taking: Reported on 05/20/2024) 20 tablet 0   No facility-administered medications prior to visit.    ROS: Review of Systems  Constitutional:  Positive for fatigue. Negative for activity change, appetite change, chills and unexpected  weight change.  HENT:  Negative for congestion, mouth sores and sinus pressure.   Eyes:  Negative for visual disturbance.  Respiratory:  Negative for cough and chest tightness.   Gastrointestinal:  Negative for abdominal pain and nausea.  Genitourinary:  Negative for difficulty urinating, frequency and vaginal pain.  Musculoskeletal:  Negative for back pain and gait problem.  Skin:  Negative for pallor and rash.  Neurological:  Negative for dizziness, tremors, weakness, numbness and headaches.  Psychiatric/Behavioral:  Negative for confusion and sleep disturbance.     Objective:  BP 118/64   Pulse 93   Temp 98.3 F (36.8 C) (Oral)   Ht 5' 7 (1.702 m)   Wt 199 lb (90.3 kg)   LMP 03/02/2017   SpO2 97%   BMI 31.17 kg/m   BP Readings  from Last 3 Encounters:  07/04/24 118/64  06/22/24 (!) 149/73  06/10/24 (!) 140/60    Wt Readings from Last 3 Encounters:  07/04/24 199 lb (90.3 kg)  06/10/24 200 lb (90.7 kg)  05/20/24 207 lb (93.9 kg)    Physical Exam Constitutional:      General: She is not in acute distress.    Appearance: She is well-developed. She is obese.  HENT:     Head: Normocephalic.     Right Ear: External ear normal.     Left Ear: External ear normal.     Nose: Nose normal.  Eyes:     General:        Right eye: No discharge.        Left eye: No discharge.     Conjunctiva/sclera: Conjunctivae normal.     Pupils: Pupils are equal, round, and reactive to light.  Neck:     Thyroid : No thyromegaly.     Vascular: No JVD.     Trachea: No tracheal deviation.  Cardiovascular:     Rate and Rhythm: Normal rate and regular rhythm.     Heart sounds: Normal heart sounds.  Pulmonary:     Effort: No respiratory distress.     Breath sounds: No stridor. No wheezing.  Abdominal:     General: Bowel sounds are normal. There is no distension.     Palpations: Abdomen is soft. There is no mass.     Tenderness: There is no abdominal tenderness. There is no guarding or rebound.  Musculoskeletal:        General: No tenderness.     Cervical back: Normal range of motion and neck supple. No rigidity.  Lymphadenopathy:     Cervical: No cervical adenopathy.  Skin:    Findings: No erythema or rash.  Neurological:     Cranial Nerves: No cranial nerve deficit.     Motor: No abnormal muscle tone.     Coordination: Coordination normal.     Deep Tendon Reflexes: Reflexes normal.  Psychiatric:        Behavior: Behavior normal.        Thought Content: Thought content normal.        Judgment: Judgment normal.     Lab Results  Component Value Date   WBC 10.4 07/04/2024   HGB 13.6 07/04/2024   HCT 42.8 07/04/2024   PLT 246 07/04/2024   GLUCOSE 183 (H) 07/04/2024   CHOL 156 08/11/2019   TRIG (H) 08/11/2019     554.0 Triglyceride is over 400; calculations on Lipids are invalid.   HDL 28.10 (L) 08/11/2019   LDLDIRECT 69.0 08/11/2019   LDLCALC 97 10/23/2017   ALT  32 05/14/2024   AST 32 05/14/2024   NA 138 07/04/2024   K 4.7 07/04/2024   CL 99 07/04/2024   CREATININE 0.74 07/04/2024   BUN 13 07/04/2024   CO2 20 07/04/2024   TSH 1.397 05/15/2024   HGBA1C 10.1 (H) 05/15/2024    CT CORONARY MORPH W/CTA COR W/SCORE W/CA W/CM &/OR WO/CM Addendum Date: 06/22/2024 ADDENDUM REPORT: 06/22/2024 19:00 EXAM: OVER-READ INTERPRETATION  CT CHEST The following report is an over-read performed by radiologist Dr. Fonda Mom [facility] on 06/22/2024. This over-read does not include interpretation of cardiac or coronary anatomy or pathology. The coronary CTA interpretation by the cardiologist is attached. COMPARISON:  06/15/2004. FINDINGS: Cardiovascular:  See findings discussed in the body of the report. Mediastinum/Nodes: No suspicious adenopathy identified. Imaged mediastinal structures are unremarkable. Lungs/Pleura: Imaged lungs are clear. No pleural effusion or pneumothorax. Upper Abdomen: No acute abnormality. Musculoskeletal: No chest wall abnormality. No acute osseous findings. There are thoracic degenerative changes. IMPRESSION: No acute extracardiac incidental findings. Electronically Signed   By: Fonda Field M.D.   On: 06/22/2024 19:00   Result Date: 06/22/2024 CLINICAL DATA:  58 yo female with chest pain, nonspecific LV dysfunction EXAM: Cardiac/Coronary CTA TECHNIQUE: A non-contrast, gated CT scan was obtained with axial slices of 2.5 mm through the heart for calcium  scoring. Calcium  scoring was performed using the Agatston method. A 120 kV prospective, gated, contrast cardiac CT scan was obtained. Gantry rotation speed was 230 msec and collimation was 0.63 mm. Two sublingual nitroglycerin  tablets (0.8 mg) were given. The 3D data set was reconstructed with motion correction for the best  systolic or diastolic phase. Images were analyzed on a dedicated workstation using MPR, MIP, and VRT modes. The patient received 100mL OMNIPAQUE  IOHEXOL  350 MG/ML SOLN of contrast. FINDINGS: Image quality: Excellent. Noise artifact is: Limited. Coronary arteries: Normal coronary origins.  Right dominance. Right Coronary Artery: Dominant. Heavily calcified with at least moderate (50-69%) to possibly severe proximal stenosis. Small R-PLB and R-PDA branches which are calcified. Left Main Coronary Artery: Calcified with at least mild 25-49% stenosis. Bifurcates into the LAD and LCx arteries. Left Anterior Descending Coronary Artery: Large anterior artery that wraps around the apex. Heavily calcified with at least moderate 50-69% to possibly severe proximal mixed stenosis with low attenuation plaque features. The mid to distal vessel demonstrates spotty calcification with up to mild 25-49% stenosis. Left Circumflex Artery: AV groove vessel with moderate concentric calcification of the mid vessel and the bifurcation of an OM branch. The OM branch does not appear diseased. The distal LCx tapers sharply consistent with severe 70-99% stenosis. Aorta: Normal size, 32 mm at the mid ascending aorta (level of the PA bifurcation) measured double oblique. Aortic atherosclerosis. No dissection. Aortic Valve: Trileaflet. Mild calcifications (Aortic valve calcium  score of 95.4) Other findings: Normal pulmonary vein drainage into the left atrium. Normal left atrial appendage without a thrombus. Mildly dilated main pulmonary artery at 30 mm, suggestive of pulmonary hypertension. Extra-cardiac findings: See attached radiology report for non-cardiac structures. IMPRESSION: 1. Heavy coronary calcification with moderate to likely severe CAD, CADRADS = 4v. CT FFR will be performed and reported separately. 2. Normal coronary origin with right dominance. 3. Coronary artery calcium  score is 2781, which places the patient in the 99th percentile  for age/race and sex-matched controls (MESA). 4. Total plaque volume 2026 mm3, of which 690 mm3 (34%) is calcified plaque and 1391mm3 (66%) is non-calcified plaque. 18 mm3 (1%) is considered low attenuation. TPV is extensive. 5.  Aggressvie risk factor modification, including lipid lowering to LDL <50 if possible is recommended. Consider anti-inflammatory therapies such as colchicine and/or icosapent ethyl. 6. Cardiac catheterization may be warranted. RECOMMENDATIONS: 1. CAD-RADS 0: No evidence of CAD (0%). Consider non-atherosclerotic causes of chest pain. 2. CAD-RADS 1: Minimal non-obstructive CAD (0-24%). Consider non-atherosclerotic causes of chest pain. Consider preventive therapy and risk factor modification. 3. CAD-RADS 2: Mild non-obstructive CAD (25-49%). Consider non-atherosclerotic causes of chest pain. Consider preventive therapy and risk factor modification. 4. CAD-RADS 3: Moderate stenosis. Consider symptom-guided anti-ischemic pharmacotherapy as well as risk factor modification per guideline directed care. Additional analysis with CT FFR will be submitted. 5. CAD-RADS 4: Severe stenosis. (70-99% or > 50% left main). Cardiac catheterization or CT FFR is recommended. Consider symptom-guided anti-ischemic pharmacotherapy as well as risk factor modification per guideline directed care. Invasive coronary angiography recommended with revascularization per published guideline statements. 6. CAD-RADS 5: Total coronary occlusion (100%). Consider cardiac catheterization or viability assessment. Consider symptom-guided anti-ischemic pharmacotherapy as well as risk factor modification per guideline directed care. 7. CAD-RADS N: Non-diagnostic study. Obstructive CAD can't be excluded. Alternative evaluation is recommended. Electronically Signed: By: Vinie JAYSON Maxcy M.D. On: 06/22/2024 12:28   CT CORONARY FRACTIONAL FLOW RESERVE FLUID ANALYSIS Result Date: 06/22/2024 EXAM: CT FFR ANALYSIS CLINICAL DATA:   cardiomyopathy FINDINGS: FFRct analysis was performed on the original cardiac CT angiogram dataset. Diagrammatic representation of the FFRct analysis is provided in a separate PDF document in PACS. This dictation was created using the PDF document and an interactive 3D model of the results. 3D model is not available in the EMR/PACS. Normal FFR range is >0.80. 1. Left Main:  No significant stenosis. FFR = 1.00 2. LAD: Significant stenosis. Proximal FFR = 0.90, Mid FFR = 0.57, Distal FFR = 0.53 3. LCX: Significant stenosis. Proximal FFR = 0.92, Distal FFR = 0.63 4. RCA: Significant stenosis. Proximal FFR = 0.96, Mid FFR = 0.61, Distal FFR = <0.50 IMPRESSION: 1. CT FFR analysis demonstrates discrete stenosis (<0.75) which is flow-limiting of the LAD, RCA and LCX. 2. Heavy calcification can sometimes cause over-estimation of flow limitation calculations. 3.  Definitive cardiac catheterization, however, is recommended. Electronically Signed   By: Vinie JAYSON Maxcy M.D.   On: 06/22/2024 12:34    Assessment & Plan:   Problem List Items Addressed This Visit     Diabetes mellitus type 2 in obese     Ncrease ozempic       Relevant Medications   Semaglutide , 1 MG/DOSE, 4 MG/3ML SOPN   Anxiety disorder   Continue with citalopram  and Wellbutrin  Continue with alprazolam  as needed  Potential benefits of a long term benzodiazepines  use as well as potential risks  and complications were explained to the patient and were aknowledged.      CHF (congestive heart failure) (HCC)   Cont Glyxambi , Ozempic       Dyslipidemia   Coronary calcium  CT ordered 03/2023  2025 /Coronary CTA Narrative & Impression  CLINICAL DATA:  58 yo female with chest pain, nonspecific LV dysfunction   EXAM: Cardiac/Coronary CTA   TECHNIQUE: A non-contrast, gated CT scan was obtained with axial slices of 2.5 mm through the heart for calcium  scoring. Calcium  scoring was performed using the Agatston method. A 120 kV prospective,  gated, contrast cardiac CT scan was obtained. Gantry rotation speed was 230 msec and collimation was 0.63 mm. Two sublingual nitroglycerin  tablets (0.8 mg) were given. The 3D data set was reconstructed with motion correction for the best systolic  or diastolic phase. Images were analyzed on a dedicated workstation using MPR, MIP, and VRT modes. The patient received OMNIPAQUE  IOHEXOL  350 MG/ML SOLN of contrast.   FINDINGS: Image quality: Excellent.   Noise artifact is: Limited.   Coronary arteries: Normal coronary origins.  Right dominance.   Right Coronary Artery: Dominant. Heavily calcified with at least moderate (50-69%) to possibly severe proximal stenosis. Small R-PLB and R-PDA branches which are calcified.   Left Main Coronary Artery: Calcified with at least mild 25-49% stenosis. Bifurcates into the LAD and LCx arteries.   Left Anterior Descending Coronary Artery: Large anterior artery that wraps around the apex. Heavily calcified with at least moderate 50-69% to possibly severe proximal mixed stenosis with low attenuation plaque features. The mid to distal vessel demonstrates spotty calcification with up to mild 25-49% stenosis.   Left Circumflex Artery: AV groove vessel with moderate concentric calcification of the mid vessel and the bifurcation of an OM branch. The OM branch does not appear diseased. The distal LCx tapers sharply consistent with severe 70-99% stenosis.   Aorta: Normal size, 32 mm at the mid ascending aorta (level of the PA bifurcation) measured double oblique. Aortic atherosclerosis. No dissection.   Aortic Valve: Trileaflet. Mild calcifications (Aortic valve calcium  score of 95.4)   Other findings:   Normal pulmonary vein drainage into the left atrium.   Normal left atrial appendage without a thrombus.   Mildly dilated main pulmonary artery at 30 mm, suggestive of pulmonary hypertension.   Extra-cardiac findings: See attached radiology  report for non-cardiac structures.   IMPRESSION: 1. Heavy coronary calcification with moderate to likely severe CAD, CADRADS = 4v. CT FFR will be performed and reported separately.   2. Normal coronary origin with right dominance.   3. Coronary artery calcium  score is 2781, which places the patient in the 99th percentile for age/race and sex-matched controls (MESA).   4. Total plaque volume 2026 mm3, of which 690 mm3 (34%) is calcified plaque and 1381mm3 (66%) is non-calcified plaque. 18 mm3 (1%) is considered low attenuation. TPV is extensive.   5. Aggressvie risk factor modification, including lipid lowering to LDL <50 if possible is recommended. Consider anti-inflammatory therapies such as colchicine and/or icosapent ethyl.   6. Cardiac catheterization may be warranted.   RECOMMENDATIONS: 1. CAD-RADS 0: No evidence of CAD (0%). Consider non-atherosclerotic causes of chest pain.   2. CAD-RADS 1: Minimal non-obstructive CAD (0-24%). Consider non-atherosclerotic causes of chest pain. Consider preventive therapy and risk factor modification.   3. CAD-RADS 2: Mild non-obstructive CAD (25-49%). Consider non-atherosclerotic causes of chest pain. Consider preventive therapy and risk factor modification.   4. CAD-RADS 3: Moderate stenosis. Consider symptom-guided anti-ischemic pharmacotherapy as well as risk factor modification per guideline directed care. Additional analysis with CT FFR will be submitted.   5. CAD-RADS 4: Severe stenosis. (70-99% or > 50% left main). Cardiac catheterization or CT FFR is recommended. Consider symptom-guided anti-ischemic pharmacotherapy as well as risk factor modification per guideline directed care. Invasive coronary angiography recommended with revascularization per published guideline statements.   6. CAD-RADS 5: Total coronary occlusion (100%). Consider cardiac catheterization or viability assessment. Consider  symptom-guided anti-ischemic pharmacotherapy as well as risk factor modification per guideline directed care.   7. CAD-RADS N: Non-diagnostic study. Obstructive CAD can't be excluded. Alternative evaluation is recommended.   Electronically Signed: By: Vinie JAYSON Maxcy M.D. On: 06/22/2024 12:28        SVT (supraventricular tachycardia) (HCC) - Primary   Ablation is planned  for 07/20/24      CAD (coronary artery disease)   F/u w/Dr Waddell  Coronary calcium  CT ordered 03/2023  2025 /Coronary CTA Narrative & Impression  CLINICAL DATA:  58 yo female with chest pain, nonspecific LV dysfunction   EXAM: Cardiac/Coronary CTA   TECHNIQUE: A non-contrast, gated CT scan was obtained with axial slices of 2.5 mm through the heart for calcium  scoring. Calcium  scoring was performed using the Agatston method. A 120 kV prospective, gated, contrast cardiac CT scan was obtained. Gantry rotation speed was 230 msec and collimation was 0.63 mm. Two sublingual nitroglycerin  tablets (0.8 mg) were given. The 3D data set was reconstructed with motion correction for the best systolic or diastolic phase. Images were analyzed on a dedicated workstation using MPR, MIP, and VRT modes. The patient received OMNIPAQUE  IOHEXOL  350 MG/ML SOLN of contrast.   FINDINGS: Image quality: Excellent.   Noise artifact is: Limited.   Coronary arteries: Normal coronary origins.  Right dominance.   Right Coronary Artery: Dominant. Heavily calcified with at least moderate (50-69%) to possibly severe proximal stenosis. Small R-PLB and R-PDA branches which are calcified.   Left Main Coronary Artery: Calcified with at least mild 25-49% stenosis. Bifurcates into the LAD and LCx arteries.   Left Anterior Descending Coronary Artery: Large anterior artery that wraps around the apex. Heavily calcified with at least moderate 50-69% to possibly severe proximal mixed stenosis with low attenuation plaque features.  The mid to distal vessel demonstrates spotty calcification with up to mild 25-49% stenosis.   Left Circumflex Artery: AV groove vessel with moderate concentric calcification of the mid vessel and the bifurcation of an OM branch. The OM branch does not appear diseased. The distal LCx tapers sharply consistent with severe 70-99% stenosis.   Aorta: Normal size, 32 mm at the mid ascending aorta (level of the PA bifurcation) measured double oblique. Aortic atherosclerosis. No dissection.   Aortic Valve: Trileaflet. Mild calcifications (Aortic valve calcium  score of 95.4)   Other findings:   Normal pulmonary vein drainage into the left atrium.   Normal left atrial appendage without a thrombus.   Mildly dilated main pulmonary artery at 30 mm, suggestive of pulmonary hypertension.   Extra-cardiac findings: See attached radiology report for non-cardiac structures.   IMPRESSION: 1. Heavy coronary calcification with moderate to likely severe CAD, CADRADS = 4v. CT FFR will be performed and reported separately.   2. Normal coronary origin with right dominance.   3. Coronary artery calcium  score is 2781, which places the patient in the 99th percentile for age/race and sex-matched controls (MESA).   4. Total plaque volume 2026 mm3, of which 690 mm3 (34%) is calcified plaque and 1317mm3 (66%) is non-calcified plaque. 18 mm3 (1%) is considered low attenuation. TPV is extensive.   5. Aggressvie risk factor modification, including lipid lowering to LDL <50 if possible is recommended. Consider anti-inflammatory therapies such as colchicine and/or icosapent ethyl.   6. Cardiac catheterization may be warranted.   RECOMMENDATIONS: 1. CAD-RADS 0: No evidence of CAD (0%). Consider non-atherosclerotic causes of chest pain.   2. CAD-RADS 1: Minimal non-obstructive CAD (0-24%). Consider non-atherosclerotic causes of chest pain. Consider preventive therapy and risk factor modification.   3.  CAD-RADS 2: Mild non-obstructive CAD (25-49%). Consider non-atherosclerotic causes of chest pain. Consider preventive therapy and risk factor modification.   4. CAD-RADS 3: Moderate stenosis. Consider symptom-guided anti-ischemic pharmacotherapy as well as risk factor modification per guideline directed care. Additional analysis with CT FFR will be  submitted.   5. CAD-RADS 4: Severe stenosis. (70-99% or > 50% left main). Cardiac catheterization or CT FFR is recommended. Consider symptom-guided anti-ischemic pharmacotherapy as well as risk factor modification per guideline directed care. Invasive coronary angiography recommended with revascularization per published guideline statements.   6. CAD-RADS 5: Total coronary occlusion (100%). Consider cardiac catheterization or viability assessment. Consider symptom-guided anti-ischemic pharmacotherapy as well as risk factor modification per guideline directed care.   7. CAD-RADS N: Non-diagnostic study. Obstructive CAD can't be excluded. Alternative evaluation is recommended.   Electronically Signed: By: Vinie JAYSON Maxcy M.D. On: 06/22/2024 12:28           Meds ordered this encounter  Medications   Semaglutide , 1 MG/DOSE, 4 MG/3ML SOPN    Sig: Inject 1 mg as directed once a week.    Dispense:  3 mL    Refill:  3      Follow-up: Return in about 2 months (around 09/04/2024) for a follow-up visit.  Marolyn Noel, MD

## 2024-07-04 NOTE — Assessment & Plan Note (Signed)
   Ncrease ozempic

## 2024-07-04 NOTE — Assessment & Plan Note (Signed)
 Cont Glyxambi , Ozempic 

## 2024-07-04 NOTE — Assessment & Plan Note (Signed)
Continue with citalopram and Wellbutrin Continue with alprazolam as needed  Potential benefits of a long term benzodiazepines  use as well as potential risks  and complications were explained to the patient and were aknowledged. 

## 2024-07-04 NOTE — Assessment & Plan Note (Addendum)
 F/u w/Dr Waddell  Coronary calcium  CT ordered 03/2023  2025 /Coronary CTA Narrative & Impression  CLINICAL DATA:  58 yo female with chest pain, nonspecific LV dysfunction   EXAM: Cardiac/Coronary CTA   TECHNIQUE: A non-contrast, gated CT scan was obtained with axial slices of 2.5 mm through the heart for calcium  scoring. Calcium  scoring was performed using the Agatston method. A 120 kV prospective, gated, contrast cardiac CT scan was obtained. Gantry rotation speed was 230 msec and collimation was 0.63 mm. Two sublingual nitroglycerin  tablets (0.8 mg) were given. The 3D data set was reconstructed with motion correction for the best systolic or diastolic phase. Images were analyzed on a dedicated workstation using MPR, MIP, and VRT modes. The patient received 100mL OMNIPAQUE  IOHEXOL  350 MG/ML SOLN of contrast.   FINDINGS: Image quality: Excellent.   Noise artifact is: Limited.   Coronary arteries: Normal coronary origins.  Right dominance.   Right Coronary Artery: Dominant. Heavily calcified with at least moderate (50-69%) to possibly severe proximal stenosis. Small R-PLB and R-PDA branches which are calcified.   Left Main Coronary Artery: Calcified with at least mild 25-49% stenosis. Bifurcates into the LAD and LCx arteries.   Left Anterior Descending Coronary Artery: Large anterior artery that wraps around the apex. Heavily calcified with at least moderate 50-69% to possibly severe proximal mixed stenosis with low attenuation plaque features. The mid to distal vessel demonstrates spotty calcification with up to mild 25-49% stenosis.   Left Circumflex Artery: AV groove vessel with moderate concentric calcification of the mid vessel and the bifurcation of an OM branch. The OM branch does not appear diseased. The distal LCx tapers sharply consistent with severe 70-99% stenosis.   Aorta: Normal size, 32 mm at the mid ascending aorta (level of the PA bifurcation) measured  double oblique. Aortic atherosclerosis. No dissection.   Aortic Valve: Trileaflet. Mild calcifications (Aortic valve calcium  score of 95.4)   Other findings:   Normal pulmonary vein drainage into the left atrium.   Normal left atrial appendage without a thrombus.   Mildly dilated main pulmonary artery at 30 mm, suggestive of pulmonary hypertension.   Extra-cardiac findings: See attached radiology report for non-cardiac structures.   IMPRESSION: 1. Heavy coronary calcification with moderate to likely severe CAD, CADRADS = 4v. CT FFR will be performed and reported separately.   2. Normal coronary origin with right dominance.   3. Coronary artery calcium  score is 2781, which places the patient in the 99th percentile for age/race and sex-matched controls (MESA).   4. Total plaque volume 2026 mm3, of which 690 mm3 (34%) is calcified plaque and 1359mm3 (66%) is non-calcified plaque. 18 mm3 (1%) is considered low attenuation. TPV is extensive.   5. Aggressvie risk factor modification, including lipid lowering to LDL <50 if possible is recommended. Consider anti-inflammatory therapies such as colchicine and/or icosapent ethyl.   6. Cardiac catheterization may be warranted.   RECOMMENDATIONS: 1. CAD-RADS 0: No evidence of CAD (0%). Consider non-atherosclerotic causes of chest pain.   2. CAD-RADS 1: Minimal non-obstructive CAD (0-24%). Consider non-atherosclerotic causes of chest pain. Consider preventive therapy and risk factor modification.   3. CAD-RADS 2: Mild non-obstructive CAD (25-49%). Consider non-atherosclerotic causes of chest pain. Consider preventive therapy and risk factor modification.   4. CAD-RADS 3: Moderate stenosis. Consider symptom-guided anti-ischemic pharmacotherapy as well as risk factor modification per guideline directed care. Additional analysis with CT FFR will be submitted.   5. CAD-RADS 4: Severe stenosis. (70-99% or > 50% left main).  Cardiac catheterization or CT FFR is recommended. Consider symptom-guided anti-ischemic pharmacotherapy as well as risk factor modification per guideline directed care. Invasive coronary angiography recommended with revascularization per published guideline statements.   6. CAD-RADS 5: Total coronary occlusion (100%). Consider cardiac catheterization or viability assessment. Consider symptom-guided anti-ischemic pharmacotherapy as well as risk factor modification per guideline directed care.   7. CAD-RADS N: Non-diagnostic study. Obstructive CAD can't be excluded. Alternative evaluation is recommended.   Electronically Signed: By: Vinie JAYSON Maxcy M.D. On: 06/22/2024 12:28

## 2024-07-04 NOTE — Assessment & Plan Note (Signed)
 Ablation is planned for 07/20/24

## 2024-07-05 LAB — CBC
Hematocrit: 42.8 % (ref 34.0–46.6)
Hemoglobin: 13.6 g/dL (ref 11.1–15.9)
MCH: 27.8 pg (ref 26.6–33.0)
MCHC: 31.8 g/dL (ref 31.5–35.7)
MCV: 88 fL (ref 79–97)
Platelets: 246 x10E3/uL (ref 150–450)
RBC: 4.89 x10E6/uL (ref 3.77–5.28)
RDW: 13.9 % (ref 11.7–15.4)
WBC: 10.4 x10E3/uL (ref 3.4–10.8)

## 2024-07-06 ENCOUNTER — Inpatient Hospital Stay: Payer: BC Managed Care – PPO

## 2024-07-06 ENCOUNTER — Other Ambulatory Visit (HOSPITAL_COMMUNITY): Payer: Self-pay

## 2024-07-06 ENCOUNTER — Ambulatory Visit: Payer: Self-pay | Admitting: Internal Medicine

## 2024-07-13 ENCOUNTER — Telehealth (HOSPITAL_COMMUNITY): Payer: Self-pay

## 2024-07-13 NOTE — Telephone Encounter (Signed)
 Spoke with patient to discuss upcoming procedure.   Labs: completed.   Any recent signs of acute illness or been started on antibiotics? No Any new medications started? No Any medications to hold?  Hold Semaglutide  for 7 days prior to the procedure- last dose on July 22. Hold Metoprolol  and Glyxambi  for 3 days prior to your procedure- last dose on July 26. Medication instructions:  On the morning of your procedure DO NOT take any medication.or the procedure may be rescheduled. Nothing to eat or drink after midnight prior to your procedure.  Confirmed patient is scheduled for SVT Ablation on Wednesday, July 30 with Dr. Danelle Birmingham. Instructed patient to arrive at the Main Entrance A at [facility]: [address] [place], [place] [address] and check in at Admitting at 6:30 AM.  Advised of plan to go home the same day and will only stay overnight if medically necessary. You MUST have a responsible adult to drive you home and MUST be with you the first 24 hours after you arrive home or your procedure could be cancelled.  Patient verbalized understanding to all instructions provided and agreed to proceed with procedure.

## 2024-07-19 NOTE — Pre-Procedure Instructions (Signed)
 Instructed patient on the following items: Arrival time 0530 Nothing to eat or drink after midnight No meds AM of procedure Responsible person to drive you home and stay with you for 24 hrs

## 2024-07-20 ENCOUNTER — Encounter (HOSPITAL_COMMUNITY)
Admission: RE | Disposition: A | Discharge status: Home / Self Care | Payer: Self-pay | Source: Home / Self Care | Attending: Internal Medicine

## 2024-07-20 ENCOUNTER — Other Ambulatory Visit: Payer: Self-pay

## 2024-07-20 ENCOUNTER — Ambulatory Visit (HOSPITAL_COMMUNITY)
Admission: RE | Admit: 2024-07-20 | Discharge: 2024-07-20 | Disposition: A | Discharge status: Home / Self Care | Attending: Internal Medicine | Admitting: Internal Medicine

## 2024-07-20 DIAGNOSIS — I471 Supraventricular tachycardia, unspecified: Secondary | ICD-10-CM | POA: Diagnosis not present

## 2024-07-20 DIAGNOSIS — R0789 Other chest pain: Secondary | ICD-10-CM | POA: Insufficient documentation

## 2024-07-20 DIAGNOSIS — Z7984 Long term (current) use of oral hypoglycemic drugs: Secondary | ICD-10-CM | POA: Insufficient documentation

## 2024-07-20 DIAGNOSIS — Z7985 Long-term (current) use of injectable non-insulin antidiabetic drugs: Secondary | ICD-10-CM | POA: Insufficient documentation

## 2024-07-20 DIAGNOSIS — Z79899 Other long term (current) drug therapy: Secondary | ICD-10-CM | POA: Insufficient documentation

## 2024-07-20 DIAGNOSIS — I4719 Other supraventricular tachycardia: Secondary | ICD-10-CM | POA: Insufficient documentation

## 2024-07-20 DIAGNOSIS — E119 Type 2 diabetes mellitus without complications: Secondary | ICD-10-CM | POA: Insufficient documentation

## 2024-07-20 HISTORY — PX: SVT ABLATION: EP1225

## 2024-07-20 LAB — GLUCOSE, CAPILLARY
Glucose-Capillary: 347 mg/dL — ABNORMAL HIGH (ref 70–99)
Glucose-Capillary: 320 mg/dL — ABNORMAL HIGH (ref 70–99)
Glucose-Capillary: 175 mg/dL — ABNORMAL HIGH (ref 70–99)

## 2024-07-20 SURGERY — SVT ABLATION

## 2024-07-20 MED ORDER — INSULIN ASPART 100 UNIT/ML IJ SOLN
8.0000 [IU] | Freq: Once | INTRAMUSCULAR | Status: AC
  Administered 2024-07-20: 8 [IU] via SUBCUTANEOUS

## 2024-07-20 MED ORDER — ONDANSETRON HCL 4 MG/2ML IJ SOLN: 4.0000 mg | Freq: Four times a day (QID) | INTRAMUSCULAR | Status: DC | PRN

## 2024-07-20 MED ORDER — FENTANYL CITRATE (PF) 100 MCG/2ML IJ SOLN
INTRAMUSCULAR | Status: DC | PRN
  Administered 2024-07-20: 25 ug via INTRAVENOUS
  Administered 2024-07-20 (×2): 12.5 ug via INTRAVENOUS

## 2024-07-20 MED ORDER — FENTANYL CITRATE (PF) 100 MCG/2ML IJ SOLN
INTRAMUSCULAR | Status: AC
Start: 2024-07-20 — End: 2024-07-20
  Filled 2024-07-20: qty 2

## 2024-07-20 MED ORDER — ACETAMINOPHEN 325 MG PO TABS
650.0000 mg | ORAL_TABLET | ORAL | Status: DC | PRN
Start: 2024-07-20 — End: 2024-07-20

## 2024-07-20 MED ORDER — SODIUM CHLORIDE 0.9 % IV SOLN: INTRAVENOUS | Status: DC

## 2024-07-20 MED ORDER — HEPARIN (PORCINE) IN NACL 1000-0.9 UT/500ML-% IV SOLN
INTRAVENOUS | Status: DC | PRN
  Administered 2024-07-20: 500 mL

## 2024-07-20 MED ORDER — BUPIVACAINE HCL (PF) 0.25 % IJ SOLN
INTRAMUSCULAR | Status: AC
  Filled 2024-07-20: qty 30

## 2024-07-20 MED ORDER — BUPIVACAINE HCL (PF) 0.25 % IJ SOLN
INTRAMUSCULAR | Status: DC | PRN
  Administered 2024-07-20: 30 mL

## 2024-07-20 MED ORDER — SODIUM CHLORIDE 0.9% FLUSH
3.0000 mL | INTRAVENOUS | Status: DC | PRN
Start: 2024-07-20 — End: 2024-07-20

## 2024-07-20 MED ORDER — MIDAZOLAM HCL 5 MG/5ML IJ SOLN
INTRAMUSCULAR | Status: AC
  Filled 2024-07-20: qty 5

## 2024-07-20 MED ORDER — SODIUM CHLORIDE 0.9 % IV SOLN: 250.0000 mL | INTRAVENOUS | Status: DC | PRN

## 2024-07-20 MED ORDER — MIDAZOLAM HCL 5 MG/5ML IJ SOLN
INTRAMUSCULAR | Status: DC | PRN
  Administered 2024-07-20: 1 mg via INTRAVENOUS
  Administered 2024-07-20: 2 mg via INTRAVENOUS
  Administered 2024-07-20: 1 mg via INTRAVENOUS

## 2024-07-20 SURGICAL SUPPLY — 10 items
BAG SNAP BAND KOVER 36X36 (MISCELLANEOUS) IMPLANT
CATH EZ STEER NAV 4MM D-F CUR (ABLATOR) IMPLANT
CATH JOSEPH QUAD ALLRED 6F REP (CATHETERS) IMPLANT
CATH POLARIS X 2.5/5/2.5 DECAP (CATHETERS) IMPLANT
PACK EP LF (CUSTOM PROCEDURE TRAY) ×1 IMPLANT
PAD DEFIB RADIO PHYSIO CONN (PAD) ×1 IMPLANT
PATCH CARTO3 (PAD) IMPLANT
SHEATH PINNACLE 6F 10CM (SHEATH) IMPLANT
SHEATH PINNACLE 7F 10CM (SHEATH) IMPLANT
SHEATH PINNACLE 8F 10CM (SHEATH) IMPLANT

## 2024-07-20 NOTE — Discharge Instructions (Signed)

## 2024-07-20 NOTE — Progress Notes (Signed)
 Erica Clinard EMTP pulled 39fr, 65fr 69fr sheath @ 9:27. Manual pressure held for 20 mins. Right DP palpable, site level 0, site dressed with tegaderm and gauze.no hematoma no bleeding. Bedrest rules given.

## 2024-07-20 NOTE — H&P (Signed)
 HPI [name] [name] is a pleasant 58 yo woman with a h/o DM. 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 He EF improved. She has had variably controlled DM. She was treated with phenteramine for weight loss. She stopped this. She had sustained palpitations lasting several hours  back in May and presented with SVT at 175/min. She was treated with IV adenosine . She was found to have mild LV dysfunction and placed on GDMT. She has done fairly well on toprol  but has still had a couple of break thru episodes of palpitations and heart rates of 150-160. She does not have angina but does have atypical pain.  Allergies       Allergies  Allergen Reactions   Adenosine         SOB and pressure for 20 seconds                Current Outpatient Medications  Medication Sig Dispense Refill   ALPRAZolam  (XANAX ) 0.5 MG tablet TAKE 1 TABLET(0.5 MG) BY MOUTH TWICE DAILY 180 tablet 0   aspirin  81 MG chewable tablet Chew 1 tablet (81 mg total) by mouth daily. 90 tablet 1   atorvastatin  (LIPITOR) 10 MG tablet TAKE 1 TABLET BY MOUTH DAILY AT 6 PM 90 tablet 3   buPROPion  (WELLBUTRIN  SR) 150 MG 12 hr tablet TAKE 1 TABLET BY MOUTH TWICE DAILY 180 tablet 1   Cholecalciferol  (EQL VITAMIN D3) 1000 units tablet Take 1 tablet (1,000 Units total) by mouth daily. 90 tablet 1   citalopram  (CELEXA ) 40 MG tablet Take 1 tablet (40 mg total) by mouth daily. 90 tablet 3   Continuous Glucose Sensor (FREESTYLE LIBRE 3 SENSOR) MISC 1 Units by Does not apply route every 14 (fourteen) days. 6 each 3   diltiazem  (CARDIZEM ) 30 MG tablet Take 1 tablet (30 mg total) by mouth 3 (three) times daily as needed. 90 tablet 0   gabapentin  (NEURONTIN ) 100 MG capsule TAKE 1 CAPSULE(100 MG) BY MOUTH THREE TIMES DAILY 90 capsule 5   GLYXAMBI  25-5 MG TABS TAKE 1 TABLET BY MOUTH DAILY 90 tablet 3   losartan  (COZAAR ) 25 MG tablet Take 1 tablet (25 mg total) by mouth daily. 30 tablet 1   metFORMIN  (GLUCOPHAGE ) 500 MG  tablet TAKE 2 TABLETS TWICE DAILY WITH FOOD 360 tablet 0   metoprolol  succinate (TOPROL  XL) 25 MG 24 hr tablet Take 1 tablet (25 mg total) by mouth daily. 30 tablet 2   pantoprazole  (PROTONIX ) 40 MG tablet TAKE 1 TABLET(40 MG) BY MOUTH TWICE DAILY 180 tablet 3   repaglinide  (PRANDIN ) 2 MG tablet TAKE 1 TABLET BY MOUTH THREE TIMES DAILY BEFORE MEALS 270 tablet 3   Semaglutide ,0.25 or 0.5MG /DOS, (OZEMPIC , 0.25 OR 0.5 MG/DOSE,) 2 MG/3ML SOPN Use 0.25 mg weekly sq for 1 month, then 0.5 mg sq weekly 3 mL 5   albuterol  (VENTOLIN  HFA) 108 (90 Base) MCG/ACT inhaler Inhale 2 puffs into the lungs every 4 (four) hours as needed for wheezing or shortness of breath. (Patient not taking: Reported on 05/20/2024) 8 g 3   cefdinir  (OMNICEF ) 300 MG capsule Take 1 capsule (300 mg total) by mouth 2 (two) times daily. 20 capsule 0   mupirocin  ointment (BACTROBAN ) 2 % On leg wound w/dressing change qd or bid (Patient not taking: Reported on 05/20/2024) 60 g 1   ondansetron  (ZOFRAN  ODT) 4 MG disintegrating tablet Take 1 tablet (4 mg total) by mouth  every 8 (eight) hours as needed for nausea or vomiting. (Patient not taking: Reported on 05/20/2024) 20 tablet 0      No current facility-administered medications for this visit.              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          Past Surgical History:  Procedure Laterality Date   CESAREAN SECTION        x 2   CHOLECYSTECTOMY   2006                 Family History  Problem Relation Age of Onset   Diabetes Mother     Hypertension Mother     Inflammatory bowel  disease Maternal Aunt     Colon cancer Paternal Aunt     Colon polyps Maternal Grandmother     Hypertension Other     Liver cancer Neg Hx     Pancreatic cancer Neg Hx     Rectal cancer Neg Hx     Stomach cancer Neg Hx     Esophageal cancer Neg Hx              Social History         Socioeconomic History   Marital status: Married      Spouse name: Ronnie   Number of children: 2   Years of education: Not on file   Highest education level: Bachelor's degree (e.g., BA, AB, BS)  Occupational History   Occupation: EC teacher      Employer: [employer]  Tobacco Use   Smoking status: Some Days      Current packs/day: 0.50      Average packs/day: 0.5 packs/day for 15.0 years (7.5 ttl pk-yrs)      Types: Cigarettes   Smokeless tobacco: Never   Tobacco comments:      info given 12/14/2019  Vaping Use   Vaping status: Never Used  Substance and Sexual Activity   Alcohol use: No   Drug use: No   Sexual activity: Yes      Birth control/protection: Post-menopausal  Other Topics Concern   Not on file  Social History Narrative   Not on file    Social Drivers of Health        Financial Resource Strain: Low Risk  (04/07/2023)    Overall Financial Resource Strain (CARDIA)     Difficulty of Paying Living Expenses: Not very hard  Food Insecurity: No Food Insecurity (05/18/2024)    Hunger Vital Sign     Worried About Running Out of Food in the Last Year: Never true     Ran Out of Food in the Last Year: Never true  Transportation Needs: No Transportation Needs (05/18/2024)    PRAPARE - Therapist, art (Medical): No     Lack of Transportation (Non-Medical):  No  Physical Activity: Insufficiently Active (04/07/2023)    Exercise Vital Sign     Days of Exercise per Week: 1 day     Minutes of Exercise per Session: 10 min  Stress: Stress Concern Present (04/07/2023)    [employer] of Occupational Health - Occupational Stress Questionnaire     Feeling  of Stress : To some extent  Social Connections: Moderately Integrated (04/07/2023)    Social Connection and Isolation Panel     Frequency of Communication with Friends and Family: More than three times a week     Frequency of Social Gatherings with Friends and Family: Never     Attends Religious Services: 1 to 4 times per year     Active Member of [employer] or Organizations: No     Attends Engineer, structural: Not on file     Marital Status: Married  Catering manager Violence: Not At Risk (05/18/2024)    Humiliation, Afraid, Rape, and Kick questionnaire     Fear of Current or Ex-Partner: No     Emotionally Abused: No     Physically Abused: No     Sexually Abused: No        BP (!) 140/60   Pulse (!) 102   Ht 5' 7 (1.702 m)   Wt 200 lb (90.7 kg)   LMP 03/02/2017   SpO2 96%   BMI 31.32 kg/m    Physical Exam:   Well appearing NAD HEENT: Unremarkable Neck:  No JVD, no thyromegally Lymphatics:  No adenopathy Back:  No CVA tenderness Lungs:  Clear with no wheezes HEART:  Regular rate rhythm, no murmurs, no rubs, no clicks Abd:  soft, positive bowel sounds, no organomegally, no rebound, no guarding Ext:  2 plus pulses, no edema, no cyanosis, no clubbing Skin:  No rashes no nodules Neuro:  CN II through XII intact, motor grossly intact   EKG - sinus tachy at 102 with RBBB and left axis.    Assess/Plan: Atypical chest pain and LV dysfunction - I will have her undergo coronary CT scanning. If abnormal Left heart cath.  SVT - As she has had recurrent symptoms and HR's in the 150-160 range, I'd suggest catheter ablation. I have discussed the indications/risks/benefits/goals/expectations and he wishes to proceed. We will perform as our schedule allows.   Danelle Tayvien Kane,MD

## 2024-07-20 NOTE — Progress Notes (Signed)
 Patient walked to the bathroom without difficulties. Right groin level 0, clean, dry, and intact.

## 2024-07-21 ENCOUNTER — Other Ambulatory Visit: Payer: Self-pay | Admitting: Internal Medicine

## 2024-07-21 ENCOUNTER — Encounter (HOSPITAL_COMMUNITY): Payer: Self-pay | Admitting: Internal Medicine

## 2024-07-21 ENCOUNTER — Telehealth (HOSPITAL_COMMUNITY): Payer: Self-pay

## 2024-07-21 NOTE — Telephone Encounter (Signed)
 Spoke with patient to complete post procedure follow up call.  Patient reports no complications with groin sites.   Instructions reviewed with patient:  Remove large bandage at puncture site after 24 hours. It is normal to have bruising, tenderness, mild swelling, and a pea or marble sized lump/knot at the groin site which can take up to three months to resolve.  Get help right away if you notice sudden swelling at the puncture site.  Check your puncture site every day for signs of infection: fever, redness, swelling, pus drainage, warmth, foul odor or excessive pain. If this occurs, please call the office at [phone], to speak with the nurse. Get help right away if your puncture site is bleeding and the bleeding does not stop after applying firm pressure to the area.  You may continue to have skipped beats during the first several months after your procedure.  You will follow up with the Dr.Gregg Waddell on 08/25/24.   Patient verbalized understanding to all instructions provided.

## 2024-08-02 ENCOUNTER — Telehealth: Payer: Self-pay | Admitting: Nurse Practitioner

## 2024-08-02 NOTE — Telephone Encounter (Signed)
 I LVM for [name] [name] to inform her that her lab appointment has been re-scheduled for 3:15 instead of 3pm

## 2024-08-03 ENCOUNTER — Inpatient Hospital Stay: Payer: BC Managed Care – PPO

## 2024-08-03 ENCOUNTER — Inpatient Hospital Stay: Attending: Physician Assistant

## 2024-08-20 ENCOUNTER — Other Ambulatory Visit: Payer: Self-pay | Admitting: Internal Medicine

## 2024-08-25 ENCOUNTER — Ambulatory Visit: Attending: Internal Medicine | Admitting: Internal Medicine

## 2024-08-25 ENCOUNTER — Encounter: Payer: Self-pay | Admitting: Internal Medicine

## 2024-08-25 VITALS — BP 155/73 | HR 98 | Ht 68.0 in | Wt 207.0 lb

## 2024-08-25 DIAGNOSIS — I471 Supraventricular tachycardia, unspecified: Secondary | ICD-10-CM

## 2024-08-25 NOTE — Patient Instructions (Addendum)
 Medication Instructions:  Your physician recommends that you continue on your current medications as directed. Please refer to the Current Medication list given to you today.  *If you need a refill on your cardiac medications before your next appointment, please call your pharmacy*  Lab Work: None ordered.  You may go to any [facility] Location for your lab work:  [facility] - [address] ([facility]) - [address] [phone]  [place] - [address]   [place]  - [address]   [place] - [address] - [address] [employer] [place]  - [address] [place] - [address] ([facility]   If you have labs (blood work) drawn today and your tests are completely normal, you will receive your results only by: [employer] (if you have MyChart)  If you have any lab test that is abnormal or we need to change your treatment, we will call you or send a MyChart message to review the results.  Testing/Procedures: None ordered.  Follow-Up: At [facility], you and your health needs are our priority.  As part of our continuing mission to provide you with exceptional heart care, we have created designated Provider Care Teams.  These Care Teams include your primary Cardiologist (physician) and Advanced Practice Providers (APPs -  Physician Assistants and Nurse Practitioners) who all work together to provide you with the care you need, when you need it.  We recommend signing up for the patient portal called MyChart.  Sign up information is provided on this After Visit Summary.  MyChart is used to connect with patients for Virtual Visits (Telemedicine).  Patients are able to view lab/test results, encounter notes, upcoming appointments, etc.  Non-urgent messages can be sent to your provider as well.   To learn more about what you can do with MyChart, go to  [email or web address].    Your next appointment:   As needed  The format for your next appointment:   In Person  Provider:   Donnice Primus, MD or one of the following Advanced Practice Providers on your designated Care Team:   Charlies Arthur, [place] Ozell Jodie Passey, [place] Leotis Barrack, NP  Note: Remote monitoring is used to monitor your Pacemaker/ ICD from home. This monitoring reduces the number of office visits required to check your device to one time per year. It allows us  to keep an eye on the functioning of your device to ensure it is working properly.

## 2024-08-25 NOTE — Progress Notes (Addendum)
 HPI Ms. Pulice is a pleasant 58 yo woman with a h/o DM. 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 He EF improved. She has had variably controlled DM. She was treated with phenteramine for weight loss. She stopped this. She had sustained palpitations lasting several hours  back in May and presented with SVT at 175/min. She was treated with IV adenosine . She was found to have mild LV dysfunction and placed on GDMT. She has done fairly well on toprol  but has still had a couple of break thru episodes of palpitations and heart rates of 150-160. She does not have angina but does have atypical pain and her CT scan was markedly abnormal suggesting the possibility of 3 vessel CAD.  She had an imaging study which showed LAD ischemia and she is pending left heart cath. 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 In the interim she has been stable. No SVT.   Allergies  Allergen Reactions   Adenosine      SOB and pressure for 20 seconds     Current Outpatient Medications  Medication Sig Dispense Refill   ALPRAZolam  (XANAX ) 0.5 MG tablet TAKE 1 TABLET(0.5 MG) BY MOUTH TWICE DAILY (Patient taking differently: Take 0.5 mg by mouth 2 (two) times daily as needed for anxiety. TAKE 1 TABLET(0.5 MG) BY MOUTH TWICE DAILY) 180 tablet 0   aspirin  81 MG chewable tablet Chew 1 tablet (81 mg total) by mouth daily. 90 tablet 1   atorvastatin  (LIPITOR) 10 MG tablet TAKE 1 TABLET BY MOUTH DAILY AT 6 PM 90 tablet 3   buPROPion  (WELLBUTRIN  SR) 150 MG 12 hr tablet TAKE 1 TABLET BY MOUTH TWICE DAILY 180 tablet 1   Cholecalciferol  (EQL VITAMIN D3) 1000 units tablet Take 1 tablet (1,000 Units total) by mouth daily. 90 tablet 1   citalopram  (CELEXA ) 40 MG tablet Take 1 tablet (40 mg total) by mouth daily. 90 tablet 3   diltiazem  (CARDIZEM ) 30 MG tablet Take 1 tablet (30 mg total) by mouth 3 (three) times daily as needed. 90 tablet 0    gabapentin  (NEURONTIN ) 100 MG capsule TAKE 1 CAPSULE(100 MG) BY MOUTH THREE TIMES DAILY (Patient taking differently: Take 100 mg by mouth at bedtime as needed (nerve pain).) 90 capsule 5   GLYXAMBI  25-5 MG TABS TAKE 1 TABLET BY MOUTH DAILY 90 tablet 3   losartan  (COZAAR ) 25 MG tablet Take 1 tablet (25 mg total) by mouth daily. 30 tablet 1   metFORMIN  (GLUCOPHAGE ) 500 MG tablet TAKE 2 TABLETS TWICE DAILY WITH FOOD 360 tablet 0   metoprolol  succinate (TOPROL -XL) 25 MG 24 hr tablet TAKE 2 TABLETS(50 MG) BY MOUTH DAILY 180 tablet 3   pantoprazole  (PROTONIX ) 40 MG tablet TAKE 1 TABLET(40 MG) BY MOUTH TWICE DAILY 180 tablet 3   repaglinide  (PRANDIN ) 2 MG tablet TAKE 1 TABLET BY MOUTH THREE TIMES DAILY BEFORE MEALS 270 tablet 3   Semaglutide , 1 MG/DOSE, 4 MG/3ML SOPN Inject 1 mg as directed once a week. 3 mL 3   No current facility-administered medications for this visit.     Past Medical History:  Diagnosis Date   Allergy    Anemia    Anxiety    Cataract    CHF (congestive heart failure) (HCC) 2005   peripartum cardiomyopathy   Depression    Diabetes mellitus    type II   GERD (gastroesophageal reflux disease)  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   Hydradenitis    right   Neuromuscular disorder (HCC)     ROS:   All systems reviewed and negative except as noted in the HPI.   Past Surgical History:  Procedure Laterality Date   CESAREAN SECTION     x 2   CHOLECYSTECTOMY  2006   SVT ABLATION N/A 07/20/2024   Procedure: SVT ABLATION;  Surgeon: Waddell Danelle ORN, MD;  Location: [facility] INVASIVE CV LAB;  Service: Cardiovascular;  Laterality: N/A;     Family History  Problem Relation Age of Onset   Diabetes Mother    Hypertension Mother    Inflammatory bowel disease Maternal Aunt    Colon cancer Paternal Aunt    Colon polyps Maternal Grandmother    Hypertension Other    Liver cancer Neg Hx    Pancreatic cancer Neg Hx    Rectal cancer Neg Hx    Stomach cancer Neg Hx    Esophageal cancer Neg Hx       Social History   Socioeconomic History   Marital status: Married    Spouse name: Ronnie   Number of children: 2   Years of education: Not on file   Highest education level: Bachelor's degree (e.g., BA, AB, BS)  Occupational History   Occupation: EC teacher    Employer: [employer]  Tobacco Use   Smoking status: Some Days    Current packs/day: 0.50    Average packs/day: 0.5 packs/day for 15.0 years (7.5 ttl pk-yrs)    Types: Cigarettes   Smokeless tobacco: Never   Tobacco comments:    info given 12/14/2019  Vaping Use   Vaping status: Never Used  Substance and Sexual Activity   Alcohol use: No   Drug use: No   Sexual activity: Yes    Birth control/protection: Post-menopausal  Other Topics Concern   Not on file  Social History Narrative   Not on file   Social Drivers of Health   Financial Resource Strain: Low Risk  (07/03/2024)   Overall Financial Resource Strain (CARDIA)    Difficulty of Paying Living Expenses: Not hard at all  Food Insecurity: No Food Insecurity (07/03/2024)   Hunger Vital Sign    Worried About Running Out of Food in the Last Year: Never true    Ran Out of Food in the Last Year: Never true  Transportation Needs: No Transportation Needs (07/03/2024)   PRAPARE - Administrator, Civil Service (Medical): No    Lack of Transportation (Non-Medical): No  Physical Activity: Inactive (07/03/2024)   Exercise Vital Sign    Days of Exercise per Week: 0 days    Minutes of Exercise per Session: Not on file  Stress: No Stress Concern Present (07/03/2024)   [employer] of Occupational Health - Occupational Stress Questionnaire    Feeling of Stress: Only a little  Social Connections: Socially Integrated (07/03/2024)   Social Connection and Isolation Panel    Frequency of Communication with Friends and Family: More than three times a week    Frequency of Social Gatherings with Friends and Family: Once a week    Attends Religious Services:  1 to 4 times per year    Active Member of [employer] or Organizations: Yes    Attends Banker Meetings: 1 to 4 times per year    Marital Status: Married  Catering manager Violence: Not At Risk (05/18/2024)   Humiliation, Afraid, Rape, and Kick questionnaire    Fear  of Current or Ex-Partner: No    Emotionally Abused: No    Physically Abused: No    Sexually Abused: No    BP (!) 155/73   Pulse 98   Ht 5' 8 (1.727 m)   Wt 207 lb (93.9 kg)   LMP 03/02/2017   SpO2 96%   BMI 31.47 kg/m   Physical Exam:  Well appearing NAD HEENT: Unremarkable Neck:  No JVD, no thyromegally Lymphatics:  No adenopathy Back:  No CVA tenderness Lungs:  Clear with no wheezes HEART:  Regular rate rhythm, no murmurs, no rubs, no clicks Abd:  soft, positive bowel sounds, no organomegally, no rebound, no guarding Ext:  2 plus pulses, no edema, no cyanosis, no clubbing Skin:  No rashes no nodules Neuro:  CN II through XII intact, motor grossly intact  EKG - NSR with RBBB  Assess/Plan: SVT - she is s/p ablation of AT and doing well. No change in meds. 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 I have reviewed the indications and she wishes to proceed. The risks/benefits will be discussed by the MD performing the procedure.   Danelle Sharicka Pogorzelski,MD

## 2024-08-27 ENCOUNTER — Other Ambulatory Visit: Payer: Self-pay | Admitting: Internal Medicine

## 2024-08-31 ENCOUNTER — Inpatient Hospital Stay: Payer: BC Managed Care – PPO | Attending: Physician Assistant

## 2024-08-31 ENCOUNTER — Other Ambulatory Visit (HOSPITAL_COMMUNITY): Payer: Self-pay

## 2024-09-03 ENCOUNTER — Other Ambulatory Visit: Payer: Self-pay | Admitting: Internal Medicine

## 2024-09-03 DIAGNOSIS — D508 Other iron deficiency anemias: Secondary | ICD-10-CM

## 2024-09-08 ENCOUNTER — Ambulatory Visit: Admitting: Internal Medicine

## 2024-09-08 ENCOUNTER — Encounter: Payer: Self-pay | Admitting: Internal Medicine

## 2024-09-08 VITALS — BP 130/80 | HR 101 | Temp 98.4°F | Ht 68.0 in | Wt 202.6 lb

## 2024-09-08 DIAGNOSIS — I509 Heart failure, unspecified: Secondary | ICD-10-CM

## 2024-09-08 DIAGNOSIS — D509 Iron deficiency anemia, unspecified: Secondary | ICD-10-CM

## 2024-09-08 DIAGNOSIS — Z23 Encounter for immunization: Secondary | ICD-10-CM

## 2024-09-08 DIAGNOSIS — I471 Supraventricular tachycardia, unspecified: Secondary | ICD-10-CM

## 2024-09-08 DIAGNOSIS — R0609 Other forms of dyspnea: Secondary | ICD-10-CM

## 2024-09-08 LAB — COMPREHENSIVE METABOLIC PANEL WITH GFR
ALT: 15 U/L (ref 0–35)
AST: 17 U/L (ref 0–37)
Albumin: 4 g/dL (ref 3.5–5.2)
Alkaline Phosphatase: 76 U/L (ref 39–117)
BUN: 9 mg/dL (ref 6–23)
CO2: 28 meq/L (ref 19–32)
Calcium: 9.2 mg/dL (ref 8.4–10.5)
Chloride: 102 meq/L (ref 96–112)
Creatinine, Ser: 0.59 mg/dL (ref 0.40–1.20)
GFR: 101.24 mL/min (ref >60.00)
Glucose, Bld: 138 mg/dL — ABNORMAL HIGH (ref 70–99)
Potassium: 3.5 meq/L (ref 3.5–5.1)
Sodium: 138 meq/L (ref 135–145)
Total Bilirubin: 0.7 mg/dL (ref 0.2–1.2)
Total Protein: 6.6 g/dL (ref 6.0–8.3)

## 2024-09-08 LAB — CBC WITH DIFFERENTIAL/PLATELET
Basophils Absolute: 0.1 K/uL (ref 0.0–0.1)
Basophils Relative: 0.9 % (ref 0.0–3.0)
Eosinophils Absolute: 0.2 K/uL (ref 0.0–0.7)
Eosinophils Relative: 2.3 % (ref 0.0–5.0)
HCT: 39.1 % (ref 36.0–46.0)
Hemoglobin: 12.4 g/dL (ref 12.0–15.0)
Lymphocytes Relative: 27.1 % (ref 12.0–46.0)
Lymphs Abs: 2.4 K/uL (ref 0.7–4.0)
MCHC: 31.7 g/dL (ref 30.0–36.0)
MCV: 81.3 fl (ref 78.0–100.0)
Monocytes Absolute: 0.4 K/uL (ref 0.1–1.0)
Monocytes Relative: 5 % (ref 3.0–12.0)
Neutro Abs: 5.6 K/uL (ref 1.4–7.7)
Neutrophils Relative %: 64.7 % (ref 43.0–77.0)
Platelets: 261 K/uL (ref 150.0–400.0)
RBC: 4.82 Mil/uL (ref 3.87–5.11)
RDW: 15 % (ref 11.5–15.5)
WBC: 8.7 K/uL (ref 4.0–10.5)

## 2024-09-08 LAB — BRAIN NATRIURETIC PEPTIDE: Pro B Natriuretic peptide (BNP): 543 pg/mL — ABNORMAL HIGH (ref 0.0–100.0)

## 2024-09-08 LAB — HEMOGLOBIN A1C: Hgb A1c MFr Bld: 8.3 % — ABNORMAL HIGH (ref 4.6–6.5)

## 2024-09-08 LAB — TSH: TSH: 2.02 u[IU]/mL (ref 0.35–5.50)

## 2024-09-08 MED ORDER — SEMAGLUTIDE (2 MG/DOSE) 8 MG/3ML ~~LOC~~ SOPN: PEN_INJECTOR | SUBCUTANEOUS | 5 refills | Status: AC

## 2024-09-08 MED ORDER — AIRSUPRA 90-80 MCG/ACT IN AERO: 2.0000 | INHALATION_SPRAY | Freq: Four times a day (QID) | RESPIRATORY_TRACT | 5 refills | Status: AC | PRN

## 2024-09-08 NOTE — Assessment & Plan Note (Signed)
Monitoring CBC 

## 2024-09-08 NOTE — Progress Notes (Signed)
 Subjective:  Patient ID: [name] [name], female    DOB: 1966-08-31  Age: 58 y.o. MRN: [id number]  CC: Follow-up (Experiencing a lot of shortness of breath. Started about two and a half weeks ago. When she is walking it happens. States at night it is worst and it keeps her up. )   HPI [name] [name] presents for s/p ablation - still not feeling well C/o DOE, gasping for air (at night too)  C/o bloating, diarrhea x 2 weeks   Outpatient Medications Prior to Visit  Medication Sig Dispense Refill   aspirin  81 MG chewable tablet Chew 1 tablet (81 mg total) by mouth daily. 90 tablet 1   atorvastatin  (LIPITOR) 10 MG tablet TAKE 1 TABLET BY MOUTH DAILY AT 6 PM 90 tablet 3   buPROPion  (WELLBUTRIN  SR) 150 MG 12 hr tablet TAKE 1 TABLET BY MOUTH TWICE DAILY 180 tablet 1   Cholecalciferol  (EQL VITAMIN D3) 1000 units tablet Take 1 tablet (1,000 Units total) by mouth daily. 90 tablet 1   citalopram  (CELEXA ) 40 MG tablet Take 1 tablet (40 mg total) by mouth daily. 90 tablet 3   diltiazem  (CARDIZEM ) 30 MG tablet Take 1 tablet (30 mg total) by mouth 3 (three) times daily as needed. 90 tablet 0   gabapentin  (NEURONTIN ) 100 MG capsule TAKE 1 CAPSULE(100 MG) BY MOUTH THREE TIMES DAILY 90 capsule 5   GLYXAMBI  25-5 MG TABS TAKE 1 TABLET BY MOUTH DAILY 90 tablet 3   losartan  (COZAAR ) 25 MG tablet TAKE 1 TABLET BY MOUTH EVERY DAY 90 tablet 3   metFORMIN  (GLUCOPHAGE ) 500 MG tablet TAKE 2 TABLETS TWICE DAILY WITH FOOD 360 tablet 0   metoprolol  succinate (TOPROL -XL) 25 MG 24 hr tablet TAKE 2 TABLETS(50 MG) BY MOUTH DAILY 180 tablet 3   pantoprazole  (PROTONIX ) 40 MG tablet TAKE 1 TABLET(40 MG) BY MOUTH TWICE DAILY 180 tablet 3   repaglinide  (PRANDIN ) 2 MG tablet TAKE 1 TABLET BY MOUTH THREE TIMES DAILY BEFORE MEALS 270 tablet 3   Semaglutide , 1 MG/DOSE, 4 MG/3ML SOPN Inject 1 mg as directed once a week. 3 mL 3   ALPRAZolam  (XANAX ) 0.5 MG tablet TAKE 1 TABLET(0.5 MG) BY MOUTH TWICE DAILY (Patient not taking:  Reported on 09/08/2024) 180 tablet 0   No facility-administered medications prior to visit.    ROS: Review of Systems  Constitutional:  Positive for fatigue. Negative for activity change, appetite change, chills and unexpected weight change.  HENT:  Negative for congestion, mouth sores and sinus pressure.   Eyes:  Negative for visual disturbance.  Respiratory:  Negative for cough and chest tightness.   Cardiovascular:  Negative for chest pain and leg swelling.  Gastrointestinal:  Positive for diarrhea. Negative for abdominal pain and nausea.  Genitourinary:  Negative for difficulty urinating, frequency and vaginal pain.  Musculoskeletal:  Positive for back pain. Negative for gait problem.  Skin:  Negative for pallor and rash.  Neurological:  Negative for dizziness, tremors, weakness, numbness and headaches.  Psychiatric/Behavioral:  Negative for confusion, sleep disturbance and suicidal ideas. The patient is nervous/anxious.     Objective:  BP 130/80   Pulse (!) 101   Temp 98.4 F (36.9 C) (Oral)   Ht 5' 8 (1.727 m)   Wt 202 lb 9.6 oz (91.9 kg)   LMP 03/02/2017   SpO2 95%   BMI 30.81 kg/m   BP Readings from Last 3 Encounters:  09/08/24 130/80  08/25/24 (!) 155/73  07/20/24 (!) 146/78  Wt Readings from Last 3 Encounters:  09/08/24 202 lb 9.6 oz (91.9 kg)  08/25/24 207 lb (93.9 kg)  07/20/24 200 lb (90.7 kg)    Physical Exam Constitutional:      General: She is not in acute distress.    Appearance: She is well-developed.  HENT:     Head: Normocephalic.     Right Ear: External ear normal.     Left Ear: External ear normal.     Nose: Nose normal.  Eyes:     General:        Right eye: No discharge.        Left eye: No discharge.     Conjunctiva/sclera: Conjunctivae normal.     Pupils: Pupils are equal, round, and reactive to light.  Neck:     Thyroid : No thyromegaly.     Vascular: No JVD.     Trachea: No tracheal deviation.  Cardiovascular:     Rate and  Rhythm: Normal rate and regular rhythm.     Heart sounds: Normal heart sounds.     No gallop.  Pulmonary:     Effort: No respiratory distress.     Breath sounds: No stridor. No wheezing.  Abdominal:     General: Bowel sounds are normal. There is no distension.     Palpations: Abdomen is soft. There is no mass.     Tenderness: There is no abdominal tenderness. There is no guarding or rebound.  Musculoskeletal:        General: No tenderness.     Cervical back: Normal range of motion and neck supple. No rigidity.  Lymphadenopathy:     Cervical: No cervical adenopathy.  Skin:    Findings: No erythema or rash.  Neurological:     Mental Status: She is oriented to person, place, and time.     Cranial Nerves: No cranial nerve deficit.     Motor: No abnormal muscle tone.     Coordination: Coordination normal.     Gait: Gait normal.     Deep Tendon Reflexes: Reflexes normal.  Psychiatric:        Behavior: Behavior normal.        Thought Content: Thought content normal.        Judgment: Judgment normal.     Lab Results  Component Value Date   WBC 10.4 07/04/2024   HGB 13.6 07/04/2024   HCT 42.8 07/04/2024   PLT 246 07/04/2024   GLUCOSE 183 (H) 07/04/2024   CHOL 156 08/11/2019   TRIG (H) 08/11/2019    554.0 Triglyceride is over 400; calculations on Lipids are invalid.   HDL 28.10 (L) 08/11/2019   LDLDIRECT 69.0 08/11/2019   LDLCALC 97 10/23/2017   ALT 32 05/14/2024   AST 32 05/14/2024   NA 138 07/04/2024   K 4.7 07/04/2024   CL 99 07/04/2024   CREATININE 0.74 07/04/2024   BUN 13 07/04/2024   CO2 20 07/04/2024   TSH 1.397 05/15/2024   HGBA1C 10.1 (H) 05/15/2024    EP STUDY Result Date: 07/21/2024 Conclusion: Successful EP study and catheter ablation of atrial tachycardia originating from the right atrium.  Following catheter ablation there were no inducible arrhythmias. Danelle Birmingham, MD    Assessment & Plan:   Problem List Items Addressed This Visit     Anemia    Monitoring CBC      CHF (congestive heart failure) (HCC)   Cont Glyxambi , Ozempic  Seems compensated clinically Cardiac cath is pending  Relevant Orders   Comprehensive metabolic panel with GFR   CBC with Differential/Platelet   TSH   Hemoglobin A1c   D-dimer, quantitative   B Nat Peptide   DOE (dyspnea on exertion)   Handicapped form CHF seems to be compensated clinically Cardiac cath is pending Treat ?asthma      Relevant Orders   Comprehensive metabolic panel with GFR   CBC with Differential/Platelet   TSH   Hemoglobin A1c   D-dimer, quantitative   B Nat Peptide   SVT (supraventricular tachycardia) (HCC)   S/p ablation      Relevant Orders   Comprehensive metabolic panel with GFR   CBC with Differential/Platelet   TSH   Hemoglobin A1c   D-dimer, quantitative   B Nat Peptide   Other Visit Diagnoses       Immunization due    -  Primary   Relevant Orders   Flu vaccine trivalent PF, 6mos and older(Flulaval,Afluria,Fluarix,Fluzone) (Completed)         Meds ordered this encounter  Medications   Albuterol -Budesonide (AIRSUPRA ) 90-80 MCG/ACT AERO    Sig: Inhale 2 Inhalations into the lungs 4 (four) times daily as needed.    Dispense:  10 g    Refill:  5   Semaglutide , 2 MG/DOSE, 8 MG/3ML SOPN    Sig: 2 mg sq weekly    Dispense:  3 mL    Refill:  5      Follow-up: No follow-ups on file.  Marolyn Noel, MD

## 2024-09-08 NOTE — Assessment & Plan Note (Signed)
 Cont Glyxambi , Ozempic  Seems compensated clinically Cardiac cath is pending

## 2024-09-08 NOTE — Assessment & Plan Note (Addendum)
 Handicapped form CHF seems to be compensated clinically Cardiac cath is pending Treat ?asthma

## 2024-09-08 NOTE — Assessment & Plan Note (Signed)
 S/p ablation

## 2024-09-09 LAB — D-DIMER, QUANTITATIVE: D-Dimer, Quant: 0.7 ug{FEU}/mL — ABNORMAL HIGH (ref <0.50)

## 2024-09-12 ENCOUNTER — Ambulatory Visit: Payer: Self-pay | Admitting: Internal Medicine

## 2024-09-12 MED ORDER — SPIRONOLACTONE 25 MG PO TABS: 25.0000 mg | ORAL_TABLET | Freq: Every day | ORAL | 3 refills | Status: DC

## 2024-09-14 ENCOUNTER — Encounter (HOSPITAL_COMMUNITY): Payer: Self-pay

## 2024-09-14 ENCOUNTER — Emergency Department (HOSPITAL_COMMUNITY)

## 2024-09-14 ENCOUNTER — Emergency Department (HOSPITAL_COMMUNITY)
Admission: EM | Admit: 2024-09-14 | Discharge: 2024-09-14 | Disposition: A | Discharge status: Home / Self Care | Attending: Emergency Medicine | Admitting: Emergency Medicine

## 2024-09-14 ENCOUNTER — Other Ambulatory Visit: Payer: Self-pay

## 2024-09-14 DIAGNOSIS — I509 Heart failure, unspecified: Secondary | ICD-10-CM | POA: Diagnosis not present

## 2024-09-14 DIAGNOSIS — R0789 Other chest pain: Secondary | ICD-10-CM | POA: Diagnosis present

## 2024-09-14 DIAGNOSIS — Z7982 Long term (current) use of aspirin: Secondary | ICD-10-CM | POA: Insufficient documentation

## 2024-09-14 DIAGNOSIS — R0781 Pleurodynia: Secondary | ICD-10-CM | POA: Insufficient documentation

## 2024-09-14 LAB — CBC WITH DIFFERENTIAL/PLATELET
Abs Immature Granulocytes: 0.03 K/uL (ref 0.00–0.07)
Basophils Absolute: 0.1 K/uL (ref 0.0–0.1)
Basophils Relative: 1 %
Eosinophils Absolute: 0.3 K/uL (ref 0.0–0.5)
Eosinophils Relative: 2 %
HCT: 38.4 % (ref 36.0–46.0)
Hemoglobin: 11.1 g/dL — ABNORMAL LOW (ref 12.0–15.0)
Immature Granulocytes: 0 %
Lymphocytes Relative: 25 %
Lymphs Abs: 2.8 K/uL (ref 0.7–4.0)
MCH: 24.8 pg — ABNORMAL LOW (ref 26.0–34.0)
MCHC: 28.9 g/dL — ABNORMAL LOW (ref 30.0–36.0)
MCV: 85.9 fL (ref 80.0–100.0)
Monocytes Absolute: 0.6 K/uL (ref 0.1–1.0)
Monocytes Relative: 5 %
Neutro Abs: 7.5 K/uL (ref 1.7–7.7)
Neutrophils Relative %: 67 %
Platelets: 284 K/uL (ref 150–400)
RBC: 4.47 MIL/uL (ref 3.87–5.11)
RDW: 14.4 % (ref 11.5–15.5)
WBC: 11.2 K/uL — ABNORMAL HIGH (ref 4.0–10.5)
nRBC: 0 % (ref 0.0–0.2)

## 2024-09-14 LAB — COMPREHENSIVE METABOLIC PANEL WITH GFR
ALT: 15 U/L (ref 0–44)
AST: 26 U/L (ref 15–41)
Albumin: 3.8 g/dL (ref 3.5–5.0)
Alkaline Phosphatase: 97 U/L (ref 38–126)
Anion gap: 13 (ref 5–15)
BUN: 9 mg/dL (ref 6–20)
CO2: 23 mmol/L (ref 22–32)
Calcium: 9.2 mg/dL (ref 8.9–10.3)
Chloride: 102 mmol/L (ref 98–111)
Creatinine, Ser: 0.63 mg/dL (ref 0.44–1.00)
GFR, Estimated: >60 mL/min (ref >60)
Glucose, Bld: 127 mg/dL — ABNORMAL HIGH (ref 70–99)
Potassium: 4 mmol/L (ref 3.5–5.1)
Sodium: 138 mmol/L (ref 135–145)
Total Bilirubin: 0.8 mg/dL (ref 0.0–1.2)
Total Protein: 6.4 g/dL — ABNORMAL LOW (ref 6.5–8.1)

## 2024-09-14 LAB — PRO BRAIN NATRIURETIC PEPTIDE: Pro Brain Natriuretic Peptide: 2286 pg/mL — ABNORMAL HIGH (ref <300.0)

## 2024-09-14 MED ORDER — OXYCODONE-ACETAMINOPHEN 5-325 MG PO TABS: 1.0000 | ORAL_TABLET | Freq: Four times a day (QID) | ORAL | 0 refills | Status: DC | PRN

## 2024-09-14 MED ORDER — OXYCODONE-ACETAMINOPHEN 5-325 MG PO TABS
1.0000 | ORAL_TABLET | Freq: Once | ORAL | Status: AC
  Administered 2024-09-14: 1 via ORAL
  Filled 2024-09-14: qty 1

## 2024-09-14 NOTE — ED Triage Notes (Signed)
 Pt presents via POV c/o right sided rib pain after husband performed heimlich on her d/t choking on saliva today. Reports pain with movement and palpation. Husband reports he felt a crack.   Reports hx CHF.

## 2024-09-14 NOTE — ED Provider Notes (Signed)
 [place] EMERGENCY DEPARTMENT AT [facility] Provider Note   CSN: [id number] Arrival date & time: 09/14/24  1928     Patient presents with: Rib Injury   [name] [name] is a 58 y.o. female.   Patient to ED with severe right lower chest pain that started following her husband performing a heimlich maneuver because he felt she was choking this afternoon. She denies abdominal pain, nausea, vomiting.   The history is provided by the patient and the spouse. No language interpreter was used.       Prior to Admission medications   Medication Sig Start Date End Date Taking? Authorizing Provider  oxyCODONE -acetaminophen  (PERCOCET/ROXICET) 5-325 MG tablet Take 1 tablet by mouth every 6 (six) hours as needed for severe pain (pain score 7-10). 09/14/24  Yes Tamsin Nader, PA-C  Albuterol -Budesonide (AIRSUPRA ) 90-80 MCG/ACT AERO Inhale 2 Inhalations into the lungs 4 (four) times daily as needed. 09/08/24   [name], Aleksei V, MD  ALPRAZolam  (XANAX ) 0.5 MG tablet TAKE 1 TABLET(0.5 MG) BY MOUTH TWICE DAILY Patient not taking: Reported on 09/08/2024 05/20/24   [name], Aleksei V, MD  aspirin  81 MG chewable tablet Chew 1 tablet (81 mg total) by mouth daily. 05/19/17   [name], Karlynn GAILS, MD  atorvastatin  (LIPITOR) 10 MG tablet TAKE 1 TABLET BY MOUTH DAILY AT 6 PM 05/20/24   [name], Aleksei V, MD  buPROPion  (WELLBUTRIN  SR) 150 MG 12 hr tablet TAKE 1 TABLET BY MOUTH TWICE DAILY 09/04/24   [name], Aleksei V, MD  Cholecalciferol  (EQL VITAMIN D3) 1000 units tablet Take 1 tablet (1,000 Units total) by mouth daily. 05/19/17   [name], Karlynn GAILS, MD  citalopram  (CELEXA ) 40 MG tablet Take 1 tablet (40 mg total) by mouth daily. 05/20/24   [name], Aleksei V, MD  diltiazem  (CARDIZEM ) 30 MG tablet Take 1 tablet (30 mg total) by mouth 3 (three) times daily as needed. 05/20/24   [name], Aleksei V, MD  gabapentin  (NEURONTIN ) 100 MG capsule TAKE 1 CAPSULE(100 MG) BY MOUTH THREE TIMES DAILY  01/29/24   [name], Aleksei V, MD  GLYXAMBI  25-5 MG TABS TAKE 1 TABLET BY MOUTH DAILY 03/02/24   [name], Aleksei V, MD  losartan  (COZAAR ) 25 MG tablet TAKE 1 TABLET BY MOUTH EVERY DAY 08/29/24   [name], Aleksei V, MD  metFORMIN  (GLUCOPHAGE ) 500 MG tablet TAKE 2 TABLETS TWICE DAILY WITH FOOD 08/22/24   [name], Aleksei V, MD  metoprolol  succinate (TOPROL -XL) 25 MG 24 hr tablet TAKE 2 TABLETS(50 MG) BY MOUTH DAILY 07/26/24   Waddell Danelle ORN, MD  pantoprazole  (PROTONIX ) 40 MG tablet TAKE 1 TABLET(40 MG) BY MOUTH TWICE DAILY 01/27/24   [name], Aleksei V, MD  repaglinide  (PRANDIN ) 2 MG tablet TAKE 1 TABLET BY MOUTH THREE TIMES DAILY BEFORE MEALS 02/01/24   [name], Karlynn GAILS, MD  Semaglutide , 2 MG/DOSE, 8 MG/3ML SOPN 2 mg sq weekly 09/08/24   [name], Aleksei V, MD  spironolactone  (ALDACTONE ) 25 MG tablet Take 1 tablet (25 mg total) by mouth daily. 09/12/24   [name], Karlynn GAILS, MD    Allergies: Adenosine     Review of Systems  Updated Vital Signs BP 135/70   Pulse 98   Temp 98.4 F (36.9 C) (Oral)   Resp 20   LMP 03/02/2017   SpO2 94%   Physical Exam Vitals and nursing note reviewed.  Constitutional:      Appearance: She is well-developed.  HENT:     Head: Normocephalic.  Cardiovascular:     Rate and Rhythm: Normal  rate and regular rhythm.     Heart sounds: No murmur heard. Pulmonary:     Effort: Pulmonary effort is normal.     Breath sounds: Normal breath sounds. No wheezing, rhonchi or rales.  Chest:     Chest wall: Tenderness (right lower chest tender to palpation) present.  Abdominal:     General: Bowel sounds are normal.     Palpations: Abdomen is soft.     Tenderness: There is no abdominal tenderness. There is no guarding or rebound.     Comments: Abdomen is nontender, specifically, no RUQ tenderness.   Musculoskeletal:        General: Normal range of motion.     Cervical back: Normal range of motion and neck supple.  Skin:    General: Skin is warm and dry.   Neurological:     General: No focal deficit present.     Mental Status: She is alert and oriented to person, place, and time.     (all labs ordered are listed, but only abnormal results are displayed) Labs Reviewed  COMPREHENSIVE METABOLIC PANEL WITH GFR - Abnormal; Notable for the following components:      Result Value   Glucose, Bld 127 (*)    Total Protein 6.4 (*)    All other components within normal limits  CBC WITH DIFFERENTIAL/PLATELET - Abnormal; Notable for the following components:   WBC 11.2 (*)    Hemoglobin 11.1 (*)    MCH 24.8 (*)    MCHC 28.9 (*)    All other components within normal limits  PRO BRAIN NATRIURETIC PEPTIDE - Abnormal; Notable for the following components:   Pro Brain Natriuretic Peptide 2,286.0 (*)    All other components within normal limits    EKG: None  Radiology: DG Ribs Unilateral W/Chest Right Result Date: 09/14/2024 CLINICAL DATA:  Pain EXAM: RIGHT RIBS AND CHEST - 3+ VIEW COMPARISON:  Chest x-ray 01/12/2009 FINDINGS: The heart is enlarged. There central pulmonary vascular congestion. There are mild patchy central airspace opacities bilaterally. There is no pleural effusion or pneumothorax. The cardiomediastinal silhouette is within normal limits. No acute fractures are seen. Specifically, no right-sided rib fractures are seen. IMPRESSION: 1. Cardiomegaly with central pulmonary vascular congestion and mild patchy central airspace opacities bilaterally. Findings may represent pulmonary edema or atypical infection. 2. No acute fractures are seen. Electronically Signed   By: Greig Pique M.D.   On: 09/14/2024 20:18     Procedures   Medications Ordered in the ED  oxyCODONE -acetaminophen  (PERCOCET/ROXICET) 5-325 MG per tablet 1 tablet (1 tablet Oral Given 09/14/24 2245)    Clinical Course as of 09/14/24 2326  Wed Sep 14, 2024  2317 Patient with right low chest pain after heimlich maneuver done earlier. No rib fracture on xray, however, feel  likely nondisplaced fracture is present. Pain better with Percocet. VS improved. No hypoxia. She can be discharged home with close follow up for recheck in the outpatient setting. [SU]    Clinical Course User Index [SU] Odell Balls, PA-C                                 Medical Decision Making Amount and/or Complexity of Data Reviewed Labs: ordered.  Risk Prescription drug management.        Final diagnoses:  Rib pain on right side    ED Discharge Orders          Ordered  oxyCODONE -acetaminophen  (PERCOCET/ROXICET) 5-325 MG tablet  Every 6 hours PRN        09/14/24 2322               Odell Balls, PA-C 09/14/24 2326    Dean Clarity, MD 09/14/24 2330

## 2024-09-14 NOTE — Discharge Instructions (Addendum)
 As we discussed, the pain you are having is likely a nondisplaced rib fracture, and this will require pain management only. Percocet has been provided.   If you develop any worsening pain, any shortness of breath or difficulty breathing, return to the ED for recheck.

## 2024-09-19 ENCOUNTER — Emergency Department (HOSPITAL_COMMUNITY)

## 2024-09-19 ENCOUNTER — Inpatient Hospital Stay (HOSPITAL_COMMUNITY)
Admission: EM | Admit: 2024-09-19 | Discharge: 2024-09-22 | DRG: 287 | Disposition: A | Discharge status: Home / Self Care | Attending: Internal Medicine | Admitting: Internal Medicine

## 2024-09-19 ENCOUNTER — Other Ambulatory Visit: Payer: Self-pay

## 2024-09-19 ENCOUNTER — Encounter (HOSPITAL_COMMUNITY): Payer: Self-pay | Admitting: Emergency Medicine

## 2024-09-19 DIAGNOSIS — G43909 Migraine, unspecified, not intractable, without status migrainosus: Secondary | ICD-10-CM | POA: Diagnosis present

## 2024-09-19 DIAGNOSIS — I509 Heart failure, unspecified: Principal | ICD-10-CM

## 2024-09-19 DIAGNOSIS — I5043 Acute on chronic combined systolic (congestive) and diastolic (congestive) heart failure: Principal | ICD-10-CM | POA: Diagnosis present

## 2024-09-19 DIAGNOSIS — F411 Generalized anxiety disorder: Secondary | ICD-10-CM | POA: Diagnosis present

## 2024-09-19 DIAGNOSIS — Z8679 Personal history of other diseases of the circulatory system: Secondary | ICD-10-CM | POA: Diagnosis not present

## 2024-09-19 DIAGNOSIS — Z888 Allergy status to other drugs, medicaments and biological substances status: Secondary | ICD-10-CM

## 2024-09-19 DIAGNOSIS — F17210 Nicotine dependence, cigarettes, uncomplicated: Secondary | ICD-10-CM | POA: Diagnosis present

## 2024-09-19 DIAGNOSIS — I471 Supraventricular tachycardia, unspecified: Secondary | ICD-10-CM | POA: Diagnosis present

## 2024-09-19 DIAGNOSIS — I4510 Unspecified right bundle-branch block: Secondary | ICD-10-CM | POA: Diagnosis present

## 2024-09-19 DIAGNOSIS — Z7984 Long term (current) use of oral hypoglycemic drugs: Secondary | ICD-10-CM

## 2024-09-19 DIAGNOSIS — I2510 Atherosclerotic heart disease of native coronary artery without angina pectoris: Secondary | ICD-10-CM | POA: Diagnosis present

## 2024-09-19 DIAGNOSIS — E782 Mixed hyperlipidemia: Secondary | ICD-10-CM | POA: Diagnosis not present

## 2024-09-19 DIAGNOSIS — J4520 Mild intermittent asthma, uncomplicated: Secondary | ICD-10-CM | POA: Diagnosis not present

## 2024-09-19 DIAGNOSIS — I272 Pulmonary hypertension, unspecified: Secondary | ICD-10-CM | POA: Diagnosis present

## 2024-09-19 DIAGNOSIS — R609 Edema, unspecified: Secondary | ICD-10-CM | POA: Diagnosis not present

## 2024-09-19 DIAGNOSIS — J45909 Unspecified asthma, uncomplicated: Secondary | ICD-10-CM | POA: Diagnosis present

## 2024-09-19 DIAGNOSIS — Z79899 Other long term (current) drug therapy: Secondary | ICD-10-CM

## 2024-09-19 DIAGNOSIS — J4521 Mild intermittent asthma with (acute) exacerbation: Secondary | ICD-10-CM | POA: Diagnosis not present

## 2024-09-19 DIAGNOSIS — Z723 Lack of physical exercise: Secondary | ICD-10-CM | POA: Diagnosis not present

## 2024-09-19 DIAGNOSIS — Z9049 Acquired absence of other specified parts of digestive tract: Secondary | ICD-10-CM | POA: Diagnosis not present

## 2024-09-19 DIAGNOSIS — Z8249 Family history of ischemic heart disease and other diseases of the circulatory system: Secondary | ICD-10-CM

## 2024-09-19 DIAGNOSIS — E1169 Type 2 diabetes mellitus with other specified complication: Secondary | ICD-10-CM | POA: Diagnosis present

## 2024-09-19 DIAGNOSIS — E781 Pure hyperglyceridemia: Secondary | ICD-10-CM | POA: Diagnosis present

## 2024-09-19 DIAGNOSIS — E785 Hyperlipidemia, unspecified: Secondary | ICD-10-CM | POA: Diagnosis present

## 2024-09-19 DIAGNOSIS — I2489 Other forms of acute ischemic heart disease: Secondary | ICD-10-CM | POA: Diagnosis present

## 2024-09-19 DIAGNOSIS — I5023 Acute on chronic systolic (congestive) heart failure: Secondary | ICD-10-CM | POA: Diagnosis not present

## 2024-09-19 DIAGNOSIS — E876 Hypokalemia: Secondary | ICD-10-CM | POA: Diagnosis present

## 2024-09-19 DIAGNOSIS — R071 Chest pain on breathing: Secondary | ICD-10-CM | POA: Diagnosis not present

## 2024-09-19 DIAGNOSIS — E7849 Other hyperlipidemia: Secondary | ICD-10-CM | POA: Diagnosis present

## 2024-09-19 DIAGNOSIS — R7989 Other specified abnormal findings of blood chemistry: Secondary | ICD-10-CM | POA: Insufficient documentation

## 2024-09-19 DIAGNOSIS — Z86711 Personal history of pulmonary embolism: Secondary | ICD-10-CM

## 2024-09-19 DIAGNOSIS — R0789 Other chest pain: Secondary | ICD-10-CM | POA: Diagnosis not present

## 2024-09-19 DIAGNOSIS — R091 Pleurisy: Secondary | ICD-10-CM | POA: Insufficient documentation

## 2024-09-19 DIAGNOSIS — Z8669 Personal history of other diseases of the nervous system and sense organs: Secondary | ICD-10-CM | POA: Insufficient documentation

## 2024-09-19 DIAGNOSIS — Z86718 Personal history of other venous thrombosis and embolism: Secondary | ICD-10-CM | POA: Diagnosis not present

## 2024-09-19 DIAGNOSIS — Z833 Family history of diabetes mellitus: Secondary | ICD-10-CM

## 2024-09-19 DIAGNOSIS — E119 Type 2 diabetes mellitus without complications: Secondary | ICD-10-CM

## 2024-09-19 DIAGNOSIS — K219 Gastro-esophageal reflux disease without esophagitis: Secondary | ICD-10-CM | POA: Diagnosis present

## 2024-09-19 DIAGNOSIS — Z7982 Long term (current) use of aspirin: Secondary | ICD-10-CM | POA: Diagnosis not present

## 2024-09-19 DIAGNOSIS — N189 Chronic kidney disease, unspecified: Secondary | ICD-10-CM | POA: Diagnosis not present

## 2024-09-19 DIAGNOSIS — I2583 Coronary atherosclerosis due to lipid rich plaque: Secondary | ICD-10-CM | POA: Diagnosis not present

## 2024-09-19 DIAGNOSIS — E1159 Type 2 diabetes mellitus with other circulatory complications: Secondary | ICD-10-CM | POA: Diagnosis not present

## 2024-09-19 DIAGNOSIS — Z720 Tobacco use: Secondary | ICD-10-CM | POA: Diagnosis not present

## 2024-09-19 LAB — BASIC METABOLIC PANEL WITH GFR
Anion gap: 12 (ref 5–15)
BUN: 5 mg/dL — ABNORMAL LOW (ref 6–20)
CO2: 22 mmol/L (ref 22–32)
Calcium: 9 mg/dL (ref 8.9–10.3)
Chloride: 102 mmol/L (ref 98–111)
Creatinine, Ser: 0.63 mg/dL (ref 0.44–1.00)
GFR, Estimated: >60 mL/min (ref >60)
Glucose, Bld: 161 mg/dL — ABNORMAL HIGH (ref 70–99)
Potassium: 3.8 mmol/L (ref 3.5–5.1)
Sodium: 136 mmol/L (ref 135–145)

## 2024-09-19 LAB — CBC
HCT: 41.6 % (ref 36.0–46.0)
Hemoglobin: 12.3 g/dL (ref 12.0–15.0)
MCH: 25.6 pg — ABNORMAL LOW (ref 26.0–34.0)
MCHC: 29.6 g/dL — ABNORMAL LOW (ref 30.0–36.0)
MCV: 86.7 fL (ref 80.0–100.0)
Platelets: 339 K/uL (ref 150–400)
RBC: 4.8 MIL/uL (ref 3.87–5.11)
RDW: 14.4 % (ref 11.5–15.5)
WBC: 11.5 K/uL — ABNORMAL HIGH (ref 4.0–10.5)
nRBC: 0 % (ref 0.0–0.2)

## 2024-09-19 LAB — TROPONIN I (HIGH SENSITIVITY)
Troponin I (High Sensitivity): 14 ng/L (ref <18)
Troponin I (High Sensitivity): 20 ng/L — ABNORMAL HIGH (ref <18)

## 2024-09-19 LAB — BRAIN NATRIURETIC PEPTIDE: B Natriuretic Peptide: 687.3 pg/mL — ABNORMAL HIGH (ref 0.0–100.0)

## 2024-09-19 MED ORDER — ASPIRIN 81 MG PO CHEW
81.0000 mg | CHEWABLE_TABLET | Freq: Every day | ORAL | Status: DC
  Administered 2024-09-19 – 2024-09-22 (×4): 81 mg via ORAL
  Filled 2024-09-19 (×4): qty 1

## 2024-09-19 MED ORDER — ATORVASTATIN CALCIUM 10 MG PO TABS
10.0000 mg | ORAL_TABLET | Freq: Every day | ORAL | Status: DC
Start: 2024-09-20 — End: 2024-09-21
  Administered 2024-09-20: 10 mg via ORAL
  Filled 2024-09-19: qty 1

## 2024-09-19 MED ORDER — LOSARTAN POTASSIUM 50 MG PO TABS
25.0000 mg | ORAL_TABLET | Freq: Every day | ORAL | Status: DC
Start: 2024-09-20 — End: 2024-09-22
  Administered 2024-09-20 – 2024-09-21 (×2): 25 mg via ORAL
  Filled 2024-09-19 (×2): qty 1

## 2024-09-19 MED ORDER — CITALOPRAM HYDROBROMIDE 20 MG PO TABS
40.0000 mg | ORAL_TABLET | Freq: Every day | ORAL | Status: DC
  Administered 2024-09-20 – 2024-09-22 (×3): 40 mg via ORAL
  Filled 2024-09-19: qty 2
  Filled 2024-09-19: qty 4
  Filled 2024-09-19: qty 2

## 2024-09-19 MED ORDER — METOPROLOL SUCCINATE ER 25 MG PO TB24
25.0000 mg | ORAL_TABLET | Freq: Two times a day (BID) | ORAL | Status: DC
Start: 2024-09-19 — End: 2024-09-21
  Administered 2024-09-19 – 2024-09-20 (×3): 25 mg via ORAL
  Filled 2024-09-19 (×4): qty 1

## 2024-09-19 MED ORDER — MORPHINE SULFATE (PF) 4 MG/ML IV SOLN
4.0000 mg | Freq: Once | INTRAVENOUS | Status: AC
  Administered 2024-09-19: 4 mg via INTRAVENOUS
  Filled 2024-09-19: qty 1

## 2024-09-19 MED ORDER — SODIUM CHLORIDE 0.9% FLUSH
3.0000 mL | Freq: Two times a day (BID) | INTRAVENOUS | Status: DC
  Administered 2024-09-20 – 2024-09-22 (×3): 3 mL via INTRAVENOUS

## 2024-09-19 MED ORDER — ALBUTEROL SULFATE (2.5 MG/3ML) 0.083% IN NEBU: 2.5000 mg | INHALATION_SOLUTION | Freq: Four times a day (QID) | RESPIRATORY_TRACT | Status: DC | PRN

## 2024-09-19 MED ORDER — SODIUM CHLORIDE 0.9% FLUSH
3.0000 mL | Freq: Two times a day (BID) | INTRAVENOUS | Status: DC
  Administered 2024-09-21 – 2024-09-22 (×2): 3 mL via INTRAVENOUS

## 2024-09-19 MED ORDER — ONDANSETRON HCL 4 MG/2ML IJ SOLN
4.0000 mg | Freq: Once | INTRAMUSCULAR | Status: AC
  Administered 2024-09-19: 4 mg via INTRAVENOUS
  Filled 2024-09-19: qty 2

## 2024-09-19 MED ORDER — KETOROLAC TROMETHAMINE 15 MG/ML IJ SOLN: 7.5000 mg | INTRAMUSCULAR | Status: DC

## 2024-09-19 MED ORDER — FUROSEMIDE 10 MG/ML IJ SOLN
40.0000 mg | Freq: Once | INTRAMUSCULAR | Status: AC
  Administered 2024-09-19: 40 mg via INTRAVENOUS
  Filled 2024-09-19: qty 4

## 2024-09-19 MED ORDER — INSULIN ASPART 100 UNIT/ML IJ SOLN: 0.0000 [IU] | Freq: Three times a day (TID) | INTRAMUSCULAR | Status: DC

## 2024-09-19 MED ORDER — BUDESONIDE 0.25 MG/2ML IN SUSP: 0.2500 mg | Freq: Four times a day (QID) | RESPIRATORY_TRACT | Status: DC | PRN

## 2024-09-19 MED ORDER — SPIRONOLACTONE 25 MG PO TABS
25.0000 mg | ORAL_TABLET | Freq: Every day | ORAL | Status: DC
  Administered 2024-09-20 – 2024-09-22 (×3): 25 mg via ORAL
  Filled 2024-09-19 (×3): qty 1

## 2024-09-19 MED ORDER — IOHEXOL 350 MG/ML SOLN
75.0000 mL | Freq: Once | INTRAVENOUS | Status: AC | PRN
  Administered 2024-09-19: 75 mL via INTRAVENOUS

## 2024-09-19 MED ORDER — INSULIN ASPART 100 UNIT/ML IJ SOLN: 0.0000 [IU] | Freq: Every day | INTRAMUSCULAR | Status: DC

## 2024-09-19 MED ORDER — SODIUM CHLORIDE 0.9 % IV SOLN: 250.0000 mL | INTRAVENOUS | Status: AC | PRN

## 2024-09-19 MED ORDER — TRIMETHOBENZAMIDE HCL 100 MG/ML IM SOLN: 200.0000 mg | Freq: Four times a day (QID) | INTRAMUSCULAR | Status: DC | PRN

## 2024-09-19 MED ORDER — FUROSEMIDE 10 MG/ML IJ SOLN
40.0000 mg | Freq: Two times a day (BID) | INTRAMUSCULAR | Status: DC
  Administered 2024-09-20 – 2024-09-21 (×4): 40 mg via INTRAVENOUS
  Filled 2024-09-19 (×4): qty 4

## 2024-09-19 MED ORDER — ALBUTEROL-BUDESONIDE 90-80 MCG/ACT IN AERO: 2.0000 | INHALATION_SPRAY | Freq: Four times a day (QID) | RESPIRATORY_TRACT | Status: DC | PRN

## 2024-09-19 MED ORDER — ENOXAPARIN SODIUM 40 MG/0.4ML IJ SOSY
40.0000 mg | PREFILLED_SYRINGE | INTRAMUSCULAR | Status: DC
  Administered 2024-09-19 – 2024-09-20 (×2): 40 mg via SUBCUTANEOUS
  Filled 2024-09-19 (×3): qty 0.4

## 2024-09-19 MED ORDER — OXYCODONE-ACETAMINOPHEN 5-325 MG PO TABS: 1.0000 | ORAL_TABLET | Freq: Four times a day (QID) | ORAL | Status: DC | PRN

## 2024-09-19 MED ORDER — KETOROLAC TROMETHAMINE 15 MG/ML IJ SOLN
15.0000 mg | INTRAMUSCULAR | Status: AC
  Administered 2024-09-19: 15 mg via INTRAVENOUS
  Filled 2024-09-19: qty 1

## 2024-09-19 MED ORDER — ACETAMINOPHEN 325 MG PO TABS: 650.0000 mg | ORAL_TABLET | Freq: Four times a day (QID) | ORAL | Status: DC | PRN

## 2024-09-19 MED ORDER — ACETAMINOPHEN 650 MG RE SUPP: 650.0000 mg | Freq: Four times a day (QID) | RECTAL | Status: DC | PRN

## 2024-09-19 MED ORDER — BUPROPION HCL ER (SR) 150 MG PO TB12
150.0000 mg | ORAL_TABLET | Freq: Two times a day (BID) | ORAL | Status: DC
  Administered 2024-09-20 – 2024-09-22 (×5): 150 mg via ORAL
  Filled 2024-09-19 (×5): qty 1

## 2024-09-19 MED ORDER — OXYCODONE-ACETAMINOPHEN 5-325 MG PO TABS
1.0000 | ORAL_TABLET | Freq: Four times a day (QID) | ORAL | Status: DC | PRN
  Administered 2024-09-20 – 2024-09-22 (×10): 1 via ORAL
  Filled 2024-09-19 (×10): qty 1

## 2024-09-19 MED ORDER — PANTOPRAZOLE SODIUM 40 MG PO TBEC
40.0000 mg | DELAYED_RELEASE_TABLET | Freq: Every day | ORAL | Status: DC
  Administered 2024-09-19 – 2024-09-22 (×4): 40 mg via ORAL
  Filled 2024-09-19 (×4): qty 1

## 2024-09-19 MED ORDER — SODIUM CHLORIDE 0.9% FLUSH: 3.0000 mL | INTRAVENOUS | Status: DC | PRN

## 2024-09-19 NOTE — ED Triage Notes (Signed)
 Pt present POV with SOB, BLE swelling, chest pain and right rib pain. Pt states right rib pain x 1 week. Was seen last week at [facility] after husband performed heimlich and right ribs have been hurting since then. SOB has been gettting worse each day and today feels like elephant is sitting on chest. Has a schedule heart cath this Thursday.

## 2024-09-19 NOTE — ED Provider Notes (Signed)
 [place] EMERGENCY DEPARTMENT AT [facility] Provider Note   CSN: [id number] Arrival date & time: 09/19/24  1623     Patient presents with: Chest Pain and Shortness of Breath   [name] [name] is a 58 y.o. female.   58 year old female with past medical history of recently diagnosed CHF as well as hyperlipidemia presenting to the emergency department today with chest pain.  Patient states she has been having chest pain and shortness of breath now over the past few weeks.  She attributes this to when her husband had the Heimlich maneuver on her and when she was choking a couple weeks ago.  She reports that she has had worsening shortness of breath since then.  She saw her primary care doctor and was started on torsemide recently after her BNP was elevated.  The patient is complaining of a lot of chest wall pain with deep breathing.  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  She has been taking torsemide and despite this she has had worsening shortness of breath and orthopnea as well as leg swelling.   Chest Pain Associated symptoms: shortness of breath   Shortness of Breath Associated symptoms: chest pain        Prior to Admission medications   Medication Sig Start Date End Date Taking? Authorizing Provider  Albuterol -Budesonide (AIRSUPRA ) 90-80 MCG/ACT AERO Inhale 2 Inhalations into the lungs 4 (four) times daily as needed. 09/08/24   [name], Aleksei V, MD  ALPRAZolam  (XANAX ) 0.5 MG tablet TAKE 1 TABLET(0.5 MG) BY MOUTH TWICE DAILY Patient not taking: Reported on 09/08/2024 05/20/24   [name], Aleksei V, MD  aspirin  81 MG chewable tablet Chew 1 tablet (81 mg total) by mouth daily. 05/19/17   [name], Aleksei V, MD  atorvastatin  (LIPITOR) 10 MG tablet TAKE 1 TABLET BY MOUTH DAILY AT 6 PM 05/20/24   [name], Aleksei V, MD  buPROPion  (WELLBUTRIN  SR) 150 MG 12 hr tablet TAKE 1 TABLET BY MOUTH TWICE DAILY 09/04/24    [name], Aleksei V, MD  Cholecalciferol  (EQL VITAMIN D3) 1000 units tablet Take 1 tablet (1,000 Units total) by mouth daily. 05/19/17   [name], Karlynn GAILS, MD  citalopram  (CELEXA ) 40 MG tablet Take 1 tablet (40 mg total) by mouth daily. 05/20/24   [name], Aleksei V, MD  diltiazem  (CARDIZEM ) 30 MG tablet Take 1 tablet (30 mg total) by mouth 3 (three) times daily as needed. 05/20/24   [name], Aleksei V, MD  gabapentin  (NEURONTIN ) 100 MG capsule TAKE 1 CAPSULE(100 MG) BY MOUTH THREE TIMES DAILY 01/29/24   [name], Aleksei V, MD  GLYXAMBI  25-5 MG TABS TAKE 1 TABLET BY MOUTH DAILY 03/02/24   [name], Aleksei V, MD  losartan  (COZAAR ) 25 MG tablet TAKE 1 TABLET BY MOUTH EVERY DAY 08/29/24   [name], Aleksei V, MD  metFORMIN  (GLUCOPHAGE ) 500 MG tablet TAKE 2 TABLETS TWICE DAILY WITH FOOD 08/22/24   [name], Aleksei V, MD  metoprolol  succinate (TOPROL -XL) 25 MG 24 hr tablet TAKE 2 TABLETS(50 MG) BY MOUTH DAILY 07/26/24   Waddell Danelle ORN, MD  oxyCODONE -acetaminophen  (PERCOCET/ROXICET) 5-325 MG tablet Take 1 tablet by mouth every 6 (six) hours as needed for severe pain (pain score 7-10). 09/14/24   Odell Balls, PA-C  pantoprazole  (PROTONIX ) 40 MG tablet TAKE 1 TABLET(40 MG) BY MOUTH TWICE DAILY 01/27/24   [name], Aleksei V, MD  repaglinide  (PRANDIN ) 2 MG tablet TAKE 1 TABLET BY MOUTH THREE TIMES DAILY BEFORE  MEALS 02/01/24   [name], Aleksei V, MD  Semaglutide , 2 MG/DOSE, 8 MG/3ML SOPN 2 mg sq weekly 09/08/24   [name], Aleksei V, MD  spironolactone  (ALDACTONE ) 25 MG tablet Take 1 tablet (25 mg total) by mouth daily. 09/12/24   [name], Karlynn GAILS, MD    Allergies: Adenosine     Review of Systems  Respiratory:  Positive for shortness of breath.   Cardiovascular:  Positive for chest pain.  All other systems reviewed and are negative.   Updated Vital Signs BP (!) 164/91 (BP Location: Right Arm)   Pulse (!) 102   Temp 98.5 F (36.9 C)   Resp 18   LMP 03/02/2017   SpO2 98%   Physical  Exam Vitals and nursing note reviewed.   Gen: Appears uncomfortable Eyes: PERRL, EOMI HEENT: no oropharyngeal swelling Neck: trachea midline Resp: Rales at bilateral lung bases Card: Tachycardic, no murmurs, rubs, or gallops Abd: nontender, nondistended Extremities: no calf tenderness, no edema Vascular: 2+ radial pulses bilaterally, 2+ DP pulses bilaterally Skin: no rashes Psyc: acting appropriately   (all labs ordered are listed, but only abnormal results are displayed) Labs Reviewed  BASIC METABOLIC PANEL WITH GFR - Abnormal; Notable for the following components:      Result Value   Glucose, Bld 161 (*)    BUN 5 (*)    All other components within normal limits  CBC - Abnormal; Notable for the following components:   WBC 11.5 (*)    MCH 25.6 (*)    MCHC 29.6 (*)    All other components within normal limits  BRAIN NATRIURETIC PEPTIDE - Abnormal; Notable for the following components:   B Natriuretic Peptide 687.3 (*)    All other components within normal limits  TROPONIN I (HIGH SENSITIVITY) - Abnormal; Notable for the following components:   Troponin I (High Sensitivity) 20 (*)    All other components within normal limits  TROPONIN I (HIGH SENSITIVITY)    EKG: None  Radiology: CT Angio Chest PE W and/or Wo Contrast Result Date: 09/19/2024 CLINICAL DATA:  Positive D-dimer. Chest pain. Concern for pulmonary disease. EXAM: CT ANGIOGRAPHY CHEST WITH CONTRAST TECHNIQUE: Multidetector CT imaging of the chest was performed using the standard protocol during bolus administration of intravenous contrast. Multiplanar CT image reconstructions and MIPs were obtained to evaluate the vascular anatomy. RADIATION DOSE REDUCTION: This exam was performed according to the departmental dose-optimization program which includes automated exposure control, adjustment of the mA and/or kV according to patient size and/or use of iterative reconstruction technique. CONTRAST:  75mL OMNIPAQUE  IOHEXOL   350 MG/ML SOLN COMPARISON:  Chest radiograph dated 09/19/2024. FINDINGS: Cardiovascular: There is no cardiomegaly. Small pericardial effusion measures approximately 7 mm in thickness. Three vessel coronary vascular calcification. Mild atherosclerotic calcification of the thoracic aorta. No aneurysmal dilatation. No pulmonary artery embolus identified. Mediastinum/Nodes: No hilar or mediastinal adenopathy. The esophagus and the thyroid  gland are grossly unremarkable. No mediastinal fluid collection. Lungs/Pleura: Small bilateral pleural effusions. There is partial compressive atelectasis of the lower lobes versus pneumonia. Patchy areas of airspace density in the upper lobes may represent edema, pneumonia, or combination. No pneumothorax. The central airways are patent. Upper Abdomen: No acute abnormality. Musculoskeletal: No acute osseous pathology. Mild degenerative changes. Review of the MIP images confirms the above findings. IMPRESSION: 1. No CT evidence of pulmonary artery embolus. 2. Small bilateral pleural effusions with partial compressive atelectasis of the lower lobes versus pneumonia. 3. Patchy areas of airspace density in the upper lobes may represent edema,  pneumonia, or combination. 4.  Aortic Atherosclerosis (ICD10-I70.0). Electronically Signed   By: Vanetta Chou M.D.   On: 09/19/2024 21:11   DG Chest 2 View Result Date: 09/19/2024 CLINICAL DATA:  Chest pain. EXAM: CHEST - 2 VIEW COMPARISON:  Chest radiograph dated 09/14/2024. FINDINGS: There is mild cardiomegaly with vascular congestion and mild edema. Small bilateral pleural effusions and bibasilar atelectasis or infiltrate. No pneumothorax. No acute osseous pathology. IMPRESSION: Mild cardiomegaly with findings of CHF and small bilateral pleural effusions. Electronically Signed   By: Vanetta Chou M.D.   On: 09/19/2024 17:23     Procedures   Medications Ordered in the ED  morphine  (PF) 4 MG/ML injection 4 mg (4 mg Intravenous Given  09/19/24 2044)  ondansetron  (ZOFRAN ) injection 4 mg (4 mg Intravenous Given 09/19/24 2044)  furosemide (LASIX) injection 40 mg (40 mg Intravenous Given 09/19/24 2044)  iohexol  (OMNIPAQUE ) 350 MG/ML injection 75 mL (75 mLs Intravenous Contrast Given 09/19/24 2106)                                    Medical Decision Making 58 year old female with past medical history of CHF and hyperlipidemia presenting to the emergency department today with chest pain and shortness of breath.  The patient's heart rate is significantly elevated on initial EKG and vitals and she is having a lot of pleuritic pain.  She did have a D-dimer ordered as an outpatient and this is elevated.  Her age-adjusted D-dimer is also elevated.  Will obtain CT angiogram although her chest x-ray and exam is more consistent with CHF but given her history pulmonary embolism will need to be ruled out.  I will give the patient morphine  and Zofran  for symptoms.  Will give her Lasix.  I will reevaluate but she will likely require admission as this is likely an failure of outpatient management with the torsemide with worsening symptoms.  The patient's CT scan does not show any findings consistent with pulmonary embolism but does show significant CHF.  Given her worsening symptoms despite the outpatient treatment and with her persistent tachycardia calls placed to hospitalist service for admission.  She is given Lasix here.  Amount and/or Complexity of Data Reviewed Labs: ordered. Radiology: ordered.  Risk Prescription drug management.        Final diagnoses:  Acute on chronic congestive heart failure, unspecified heart failure type [facility])    ED Discharge Orders     None          Ula Prentice SAUNDERS, MD 09/19/24 2132

## 2024-09-19 NOTE — ED Provider Triage Note (Signed)
 Emergency Medicine Provider Triage Evaluation Note  [name] [name] , a 58 y.o. female  was evaluated in triage.  Pt complains of worsening shortness of breath, lower extremity swelling and right sided rib pain.  Patient reports rib pain starting about a week ago.  This is after a Heimlich maneuver.  She had x-rays which did not show fracture.  Shortness of breath has worsened.  No fever or cough.  She reports tightness and pressure in her chest today.  Scheduled for heart catheterization in near future.  Review of Systems  Positive: Chest tightness, lower extremity swelling Negative: Fever  Physical Exam  BP (!) 164/91 (BP Location: Right Arm)   Pulse (!) 119   Temp 99 F (37.2 C)   Resp (!) 22   LMP 03/02/2017   SpO2 96%  Gen:   Awake, tearful and crying, appears uncomfortable Resp:  Normal effort  MSK:   Moves extremities without difficulty  Other:  Ambulatory without difficulty  Medical Decision Making  Medically screening exam initiated at 4:36 PM.  Appropriate orders placed.  KRISHAWNA STIEFEL was informed that the remainder of the evaluation will be completed by another provider, this initial triage assessment does not replace that evaluation, and the importance of remaining in the ED until their evaluation is complete.     Desiderio Chew, PA-C 09/19/24 [id number]

## 2024-09-19 NOTE — ED Notes (Signed)
 Patient transported to CT

## 2024-09-19 NOTE — H&P (Signed)
 History and Physical    [name] [name] FMW:[id number] DOB: [birth year] DOA: 09/19/2024  PCP: Garald Karlynn GAILS, MD   Patient coming from: Home   Chief Complaint:  Chief Complaint  Patient presents with   Chest Pain   Shortness of Breath   ED TRIAGE note:Pt present POV with SOB, BLE swelling, chest pain and right rib pain. Pt states right rib pain x 1 week. Was seen last week at [facility] after husband performed heimlich and right ribs have been hurting since then. SOB has been gettting worse each day and today feels like elephant is sitting on chest. Has a schedule heart cath this Thursday.   HPI:  [name] [name] is a 58 y.o. female with medical history significant of SVT status post ablation, HFrEF reduced EF 40 to 45%, remote peripartum cardiomyopathy-resolved, generalized anxiety disorder, hyperlipidemia, non-insulin -dependent DM type II, migraine, chronic headache,  and GERD presented to emergency department complaining of chest pain and shortness of breath.  Patient stated that she is having chest pain shortness of breath over the course of last few weeks which is attributed to when her husband had a Helmick maneuver on her when she was choking couple of weeks ago. Patient reported worsening shortness of breath since then.  Seen PCP and patient started on torsemide due to elevated BNP.  Patient is complaining about right sided lower chest wall pain and having bruising.  Pain get worse with movement and deep breathing.g.  Reported she has remote history of DVT that has been successfully treated not on anticoagulation anymore.  Complaining about persistent shortness of breath and leg swelling even though patient has been taking torsemide.   ED Course:  Presentation to ED patient is tachycardic heart rate 119, hypertensive blood pressure 164/91 and O2 sat 98% room air.  Afebrile.  Initial troponin 14 trended up 20.  Elevated BNP 687 (BNP was 2286 5 days ago 9/24). CBC showing leukocytosis  11.5 otherwise unremarkable. BMP unremarkable.  EKG showing sinus tachycardia heart rate 119, left ventricular hypertrophy with repolarization abnormality.  Prolonged QTc heart rate 506.  EKG findings are similar as compared to previous EKG 9/24 except currently showing sinus tachycardia.  CTA chest no evidence of pulmonary embolism.  Small bilateral pleural effusion, concern for atelectasis versus pneumonia.  Patchy airspace opacity upper lobe represents pneumonia or edema or combination.  Chest x-ray showing cardiomegaly finding consistent with CHF and bilateral pleural effusion.  Given patient has persistent CHF in the ED received IV Lasix.  Per chart review previous CT coronary study in July 2025 showing severe CAD, elevated coronary artery calcification scoring. Patient was scheduled for heart cath in August 2025.  Dr. Ula consulted cardiology pending recommendation.  Given patient has been scheduled for outpatient heart cath and with previous history of SVT recommended Dr. Ula to speak with cardiology to get further recommendation as well.  Hospitalist has been consulted for further evaluation management of acute on chronic CHF exacerbation, elevated troponin secondary demand ischemia.   Significant labs in the ED: Lab Orders         Basic metabolic panel         CBC         Brain natriuretic peptide         Comprehensive metabolic panel         CBC       Review of Systems:  Review of Systems  Constitutional:  Positive for malaise/fatigue. Negative for chills, fever and weight loss.  Respiratory:  Negative for cough, sputum production and shortness of breath.   Cardiovascular:  Positive for chest pain, orthopnea and leg swelling. Negative for PND.       Right lower chest wall pain.    Gastrointestinal:  Negative for abdominal pain, heartburn, nausea and vomiting.  Neurological:  Negative for dizziness and headaches.  Psychiatric/Behavioral:  The patient is not  nervous/anxious.     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 She has a 7.5 pack-year smoking history. She has never used smokeless tobacco. She reports that she does not drink alcohol and does not use drugs.  Allergies  Allergen Reactions   Adenosine      SOB and pressure for 20 seconds    Family History  Problem Relation Age of Onset   Diabetes Mother    Hypertension Mother    Inflammatory bowel disease Maternal Aunt    Colon cancer Paternal Aunt    Colon polyps Maternal Grandmother    Hypertension Other    Liver cancer Neg Hx    Pancreatic cancer Neg Hx    Rectal cancer Neg Hx    Stomach cancer Neg Hx    Esophageal cancer Neg Hx     Prior to Admission medications   Medication Sig Start Date End Date Taking? Authorizing Provider  Albuterol -Budesonide (AIRSUPRA ) 90-80 MCG/ACT AERO Inhale 2 Inhalations into the lungs 4 (four) times daily as needed. 09/08/24   [name], Aleksei V, MD  ALPRAZolam  (XANAX ) 0.5 MG tablet TAKE 1 TABLET(0.5 MG) BY MOUTH TWICE DAILY Patient not taking: Reported on 09/08/2024 05/20/24   [name], Aleksei V, MD  aspirin  81 MG chewable tablet Chew 1 tablet (81 mg total) by mouth daily. 05/19/17   [name], Aleksei V, MD  atorvastatin  (LIPITOR) 10 MG tablet TAKE 1 TABLET BY MOUTH DAILY AT 6 PM 05/20/24   [name], Aleksei V, MD  buPROPion  (WELLBUTRIN   SR) 150 MG 12 hr tablet TAKE 1 TABLET BY MOUTH TWICE DAILY 09/04/24   [name], Aleksei V, MD  Cholecalciferol  (EQL VITAMIN D3) 1000 units tablet Take 1 tablet (1,000 Units total) by mouth daily. 05/19/17   [name], Karlynn GAILS, MD  citalopram  (CELEXA ) 40 MG tablet Take 1 tablet (40 mg total) by mouth daily. 05/20/24   [name], Aleksei V, MD  diltiazem  (CARDIZEM ) 30 MG tablet Take 1 tablet (30 mg total) by mouth 3 (three) times daily as needed. 05/20/24   [name], Aleksei V, MD  gabapentin  (NEURONTIN ) 100 MG capsule TAKE 1 CAPSULE(100 MG) BY MOUTH THREE TIMES DAILY 01/29/24   [name], Aleksei V, MD  GLYXAMBI  25-5 MG TABS TAKE 1 TABLET BY MOUTH DAILY 03/02/24   [name], Aleksei V, MD  losartan  (COZAAR ) 25 MG tablet TAKE 1 TABLET BY MOUTH EVERY  DAY 08/29/24   [name], Aleksei V, MD  metFORMIN  (GLUCOPHAGE ) 500 MG tablet TAKE 2 TABLETS TWICE DAILY WITH FOOD 08/22/24   [name], Aleksei V, MD  metoprolol  succinate (TOPROL -XL) 25 MG 24 hr tablet TAKE 2 TABLETS(50 MG) BY MOUTH DAILY 07/26/24   Waddell Danelle ORN, MD  oxyCODONE -acetaminophen  (PERCOCET/ROXICET) 5-325 MG tablet Take 1 tablet by mouth every 6 (six) hours as needed for severe pain (pain score 7-10). 09/14/24   Odell Balls, PA-C  pantoprazole  (PROTONIX ) 40 MG tablet TAKE 1 TABLET(40 MG) BY MOUTH TWICE DAILY 01/27/24   [name], Aleksei V, MD  repaglinide  (PRANDIN ) 2 MG tablet TAKE 1 TABLET BY MOUTH THREE TIMES DAILY BEFORE MEALS 02/01/24   [name], Karlynn GAILS, MD  Semaglutide , 2 MG/DOSE, 8 MG/3ML SOPN 2 mg sq weekly 09/08/24   [name], Aleksei V, MD  spironolactone  (ALDACTONE ) 25 MG tablet Take 1 tablet (25 mg total) by mouth daily. 09/12/24   [name], Karlynn GAILS, MD     Physical Exam: Vitals:   09/19/24 1629 09/19/24 1633 09/19/24 2000  BP: (!) 164/91    Pulse: (!) 119  (!) 102  Resp:  (!) 22 18  Temp: 99 F (37.2 C)  98.5 F (36.9 C)  SpO2: 96%  98%    Physical Exam Vitals and nursing note reviewed.  Constitutional:       Appearance: She is not ill-appearing.  Cardiovascular:     Rate and Rhythm: Regular rhythm. Tachycardia present.     Heart sounds: Normal heart sounds.  Pulmonary:     Effort: Pulmonary effort is normal.     Breath sounds: Normal breath sounds.  Chest:     Chest wall: Tenderness present.     Comments: Right sided lower chest wall pain around the rib cage area with bruising Musculoskeletal:     Right lower leg: Edema present.     Left lower leg: Edema present.  Skin:    General: Skin is warm.     Capillary Refill: Capillary refill takes less than 2 seconds.  Neurological:     Mental Status: She is alert and oriented to person, place, and time.  Psychiatric:        Mood and Affect: Mood normal.      Labs on Admission: I have personally reviewed following labs and imaging studies  CBC: Recent Labs  Lab 09/14/24 2151 09/19/24 1654  WBC 11.2* 11.5*  NEUTROABS 7.5  --   HGB 11.1* 12.3  HCT 38.4 41.6  MCV 85.9 86.7  PLT 284 339   Basic Metabolic Panel: Recent Labs  Lab 09/14/24 2151 09/19/24 1654  NA 138 136  K 4.0 3.8  CL 102 102  CO2 23 22  GLUCOSE 127* 161*  BUN 9 5*  CREATININE 0.63 0.63  CALCIUM  9.2 9.0   GFR: Estimated Creatinine Clearance: 94.2 mL/min (by C-G formula based on SCr of 0.63 mg/dL). Liver Function Tests: Recent Labs  Lab 09/14/24 2151  AST 26  ALT 15  ALKPHOS 97  BILITOT 0.8  PROT 6.4*  ALBUMIN 3.8   No results for input(s): LIPASE, AMYLASE in the last 168 hours. No results for input(s): AMMONIA in the last 168 hours. Coagulation Profile: No results for input(s): INR, PROTIME in the last 168 hours. Cardiac Enzymes: Recent Labs  Lab 09/19/24 1654 09/19/24 2000  TROPONINIHS 14 20*   BNP (last 3 results) Recent Labs    09/19/24 1655  BNP 687.3*   HbA1C: No results for input(s): HGBA1C in the last 72  hours. CBG: No results for input(s): GLUCAP in the last 168 hours. Lipid Profile: No results for input(s):  CHOL, HDL, LDLCALC, TRIG, CHOLHDL, LDLDIRECT in the last 72 hours. Thyroid  Function Tests: No results for input(s): TSH, T4TOTAL, FREET4, T3FREE, THYROIDAB in the last 72 hours. Anemia Panel: No results for input(s): VITAMINB12, FOLATE, FERRITIN, TIBC, IRON , RETICCTPCT in the last 72 hours. Urine analysis:    Component Value Date/Time   COLORURINE COLORLESS (A) 05/14/2024 2216   APPEARANCEUR CLEAR 05/14/2024 2216   LABSPEC 1.037 (H) 05/14/2024 2216   PHURINE 5.5 05/14/2024 2216   GLUCOSEU >1,000 (A) 05/14/2024 2216   GLUCOSEU >=1000 (A) 08/11/2019 0742   HGBUR NEGATIVE 05/14/2024 2216   BILIRUBINUR NEGATIVE 05/14/2024 2216   KETONESUR NEGATIVE 05/14/2024 2216   PROTEINUR NEGATIVE 05/14/2024 2216   UROBILINOGEN 0.2 08/11/2019 0742   NITRITE NEGATIVE 05/14/2024 2216   LEUKOCYTESUR NEGATIVE 05/14/2024 2216    Radiological Exams on Admission: I have personally reviewed images CT Angio Chest PE W and/or Wo Contrast Result Date: 09/19/2024 CLINICAL DATA:  Positive D-dimer. Chest pain. Concern for pulmonary disease. EXAM: CT ANGIOGRAPHY CHEST WITH CONTRAST TECHNIQUE: Multidetector CT imaging of the chest was performed using the standard protocol during bolus administration of intravenous contrast. Multiplanar CT image reconstructions and MIPs were obtained to evaluate the vascular anatomy. RADIATION DOSE REDUCTION: This exam was performed according to the departmental dose-optimization program which includes automated exposure control, adjustment of the mA and/or kV according to patient size and/or use of iterative reconstruction technique. CONTRAST:  75mL OMNIPAQUE  IOHEXOL  350 MG/ML SOLN COMPARISON:  Chest radiograph dated 09/19/2024. FINDINGS: Cardiovascular: There is no cardiomegaly. Small pericardial effusion measures approximately 7 mm in thickness. Three vessel coronary vascular calcification. Mild atherosclerotic calcification of the thoracic aorta. No  aneurysmal dilatation. No pulmonary artery embolus identified. Mediastinum/Nodes: No hilar or mediastinal adenopathy. The esophagus and the thyroid  gland are grossly unremarkable. No mediastinal fluid collection. Lungs/Pleura: Small bilateral pleural effusions. There is partial compressive atelectasis of the lower lobes versus pneumonia. Patchy areas of airspace density in the upper lobes may represent edema, pneumonia, or combination. No pneumothorax. The central airways are patent. Upper Abdomen: No acute abnormality. Musculoskeletal: No acute osseous pathology. Mild degenerative changes. Review of the MIP images confirms the above findings. IMPRESSION: 1. No CT evidence of pulmonary artery embolus. 2. Small bilateral pleural effusions with partial compressive atelectasis of the lower lobes versus pneumonia. 3. Patchy areas of airspace density in the upper lobes may represent edema, pneumonia, or combination. 4.  Aortic Atherosclerosis (ICD10-I70.0). Electronically Signed   By: Vanetta Chou M.D.   On: 09/19/2024 21:11   DG Chest 2 View Result Date: 09/19/2024 CLINICAL DATA:  Chest pain. EXAM: CHEST - 2 VIEW COMPARISON:  Chest radiograph dated 09/14/2024. FINDINGS: There is mild cardiomegaly with vascular congestion and mild edema. Small bilateral pleural effusions and bibasilar atelectasis or infiltrate. No pneumothorax. No acute osseous pathology. IMPRESSION: Mild cardiomegaly with findings of CHF and small bilateral pleural effusions. Electronically Signed   By: Vanetta Chou M.D.   On: 09/19/2024 17:23     EKG: My personal interpretation of EKG shows: Sinus tachycardia heart rate 119 and prolonged QTc interval.    Assessment/Plan: Principal Problem:   Acute exacerbation of CHF (congestive heart failure) (HCC) Active Problems:   Chest pain on breathing   Generalized anxiety disorder   Reactive airway disease   Non-insulin  dependent type 2 diabetes mellitus (HCC)   Hyperlipidemia    History of paroxysmal SVT (supraventricular tachycardia)  Elevated troponin   Pleuritis   History of migraine   History of CAD (coronary artery disease)   Acute on chronic combined systolic and diastolic CHF (congestive heart failure) (HCC)    Assessment and Plan: Acute on chronic CHF exacerbation History of combined CHF with reduced EF 40 to 45% Elevated troponin secondary to demand ischemia -Presented to the emergency department progressively worsening shortness of breath, bilateral lower extremity swelling and worsening chest pain with deep breathing since having an Heimlich maneuver 3 weeks ago.  Patient recently seen by primary care physician office 1 week ago found to have elevated BNP 2300, history of previous CAD based on abnormal coronary CT calcium  scoring and previously been scheduled for heart cath in August 2025.  Also outpatient D-dimer found elevated 0.7 and CTA chest today ruled out acute PE and however it showed bilateral pulmonary vascular congestion. - At presentation to ED patient is borderline hypertensive and tachycardic.  EKG showing sinus tachycardia heart rate 119. - Initial troponin 14 and trended up to 20.  Patient has bilateral chest wall pain which is tenderness on palpation.  Noncardiac chest pain in the setting of recent Hamrick maneuver and also pleuritic chest pain. -Labs CBC showing leukocytosis 11.5.  Since patient has been started on torsemide 20 mg by PCP 1 week ago BNP has been improved 2286 to 687 today. - Chest x-ray showing cardiomegaly with pulmonary vascular congestion. -Elevated troponin in the context of demand ischemia.  At this time there is no concern for acute coronary syndrome. -The ED patient received IV Lasix 40 mg.  Plan to continue IV Lasix 40 mg twice daily.  Strict I's/O, daily weight.  Monitor urine output. -Continue Toprol -XL and Aldactone .   -Consider to start [facility]  outpatient. - Obtaining echocardiogram. - Continue cardiac  monitoring. -Given patient has previous abnormal CT calcium  scoring result with history of severe CAD and CHF requesting  ED physician to consult and discuss case with cardiology for further recommendation.   Noncardiac chest pain from recent Heimlich maneuver Pleuritic chest pain - Patient has significant right-sided lower chest wall pain around the rib cage area and physical exam showing significant bruising.  Chest pain get worse with movement and deep breathing.  C Pleuritic and musculoskeletal origin chest pain in the context of recent Heimlich maneuver. -Continue Percocet as needed and stable renal function-giving 1 dose of IV Toradol  15 mg.   History of SVT status post ablation -History of SVT status post ablation.  EKG showing sinus tachycardia heart rate 119. - Starting Toprol -XL 50 mg daily.  Elevated D-dimer - Outpatient lab work showing elevated D-dimer 0.5.  Patient reported previous history of DVT. CTA chest ruled out acute pulmonary embolism. Patient has bilateral lower extremity swelling in the context of CHF however given patient has previous history of DVT and in the setting of elevated D-dimer obtaining bilateral lower extremity ultrasound rule out redevelopment of DVT.    History of severe CAD -Abnormal CT coronary calcium  scoring and severe CAD in July 2025.  Patient supposed to be undergoing heart cath. - Continue medical management include aspirin  81 mg daily, Lipitor 10 mg daily and Toprol -XL 50 mg daily. -Obtaining echocardiogram today in the setting of CHF exacerbation  Non-insulin -dependent DM type II -At home patient takes metformin  holding metformin  in case patient develops AKI or undergoing heart cath.  Pending pharmacy verification of repaglinide  and semaglutide  use. - Continue sliding scale insulin  mealtime coverage.  History of migraine -Continue Tylenol  as needed  Hyperlipidemia -  Continue Lipitor     DVT prophylaxis:  Lovenox  Code Status:  Full  Code Diet: Heart healthy carb modified diet Family Communication:   Family was present at bedside, at the time of interview. Opportunity was given to ask question and all questions were answered satisfactorily.  Disposition Plan: Continue monitoring improvement of volume status and echocardiogram result Consults: Cardiology Admission status:   Inpatient, Telemetry bed  Severity of Illness: The appropriate patient status for this patient is INPATIENT. Inpatient status is judged to be reasonable and necessary in order to provide the required intensity of service to ensure the patient's safety. The patient's presenting symptoms, physical exam findings, and initial radiographic and laboratory data in the context of their chronic comorbidities is felt to place them at high risk for further clinical deterioration. Furthermore, it is not anticipated that the patient will be medically stable for discharge from the hospital within 2 midnights of admission.   * I certify that at the point of admission it is my clinical judgment that the patient will require inpatient hospital care spanning beyond 2 midnights from the point of admission due to high intensity of service, high risk for further deterioration and high frequency of surveillance required.DEWAINE    Marybella Ethier, MD [facility]  How to contact the [facility] Attending or Consulting provider 7A - 7P or covering provider during after hours 7P -7A, for this patient.  Check the care team in [facility] and look for a) attending/consulting [facility] provider listed and b) the [facility] team listed Log into [email or web address] and use [facility]'s universal password to access. If you do not have the password, please contact the hospital operator. Locate the [facility] provider you are looking for under [facility] and page to a number that you can be directly reached. If you still have difficulty reaching the provider, please page the [facility] (Director on Call) for the Hospitalists  listed on amion for assistance.  09/19/2024, 10:12 PM

## 2024-09-19 NOTE — Consult Note (Signed)
 Cardiology Consultation   Patient ID: [name] MRN: [id number]; DOB: 03-30-1966  Admit date: 09/19/2024 Date of Consult: 09/19/2024  PCP:  [name] Karlynn GAILS, MD   [facility] Providers Cardiologist:  None        Patient Profile: [name] [name] is a 58 y.o. 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  History of Present Illness: [name] [name] reports that for about 2 weeks, she's been having progressively worsening SOB, PND, orthopnea, and leg swelling. She also describes intermittent episodes of left-sided, non-exertional, squeezing chest pain with intermittent radiation to the neck. She also reports that a week ago,  she aspirated while drinking water; her husband performed Heimlich maneuver on her as he thought she was choking; since then, she's been having a different type of pleuritic, sharp, left-sided chest pain.   She was recently diagnosed with HFmrEF in 04/2024 with LVEF of 40-45%. She underwent CTA coronary with CT-FFR, which showed flow-limiting stenosis in the LAD, RCA, and LCX. She's scheduled to undergo coronary angiogram on 10/2.   Labs showed normal Cr 0.6, troponin 14 =>, BNP 687. EKG with sinus tachycardia and RBBB. CXR showed pulmonary edema with small bilateral pleural effusion. CTPE showed the same but no PE.  She received Lasix 40 mg IV.   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 Authorizing Provider  Albuterol -Budesonide (AIRSUPRA ) 90-80 MCG/ACT AERO Inhale 2 Inhalations into the lungs 4 (four) times daily as needed. 09/08/24   [name], Aleksei V, MD  ALPRAZolam  (XANAX ) 0.5 MG tablet TAKE 1 TABLET(0.5 MG) BY MOUTH TWICE DAILY Patient not taking: Reported on 09/08/2024 05/20/24   [name], Aleksei V, MD  aspirin  81 MG chewable tablet Chew 1 tablet (81 mg total) by mouth daily. 05/19/17   [name], Karlynn GAILS, MD  atorvastatin  (LIPITOR) 10 MG tablet TAKE 1 TABLET BY MOUTH DAILY AT 6 PM 05/20/24   [name], Aleksei V, MD  buPROPion  (WELLBUTRIN  SR) 150 MG 12 hr tablet TAKE 1 TABLET BY MOUTH TWICE DAILY 09/04/24   [name], Aleksei V, MD  Cholecalciferol  (EQL VITAMIN D3) 1000 units tablet Take 1 tablet (1,000 Units total) by mouth daily. 05/19/17   [name], Karlynn GAILS, MD  citalopram  (CELEXA ) 40 MG tablet Take 1 tablet (40 mg total) by mouth daily. 05/20/24   [name], Aleksei V, MD  diltiazem  (CARDIZEM ) 30 MG tablet Take 1 tablet (30 mg total) by mouth 3 (three) times daily as needed. 05/20/24   [name], Aleksei V, MD  gabapentin  (NEURONTIN ) 100 MG  capsule TAKE 1 CAPSULE(100 MG) BY MOUTH THREE TIMES DAILY 01/29/24   [name], Aleksei V, MD  GLYXAMBI  25-5 MG TABS TAKE 1 TABLET BY MOUTH DAILY 03/02/24   [name], Aleksei V, MD  losartan  (COZAAR ) 25 MG tablet TAKE 1 TABLET BY MOUTH EVERY DAY 08/29/24   [name], Aleksei V, MD  metFORMIN  (GLUCOPHAGE ) 500 MG tablet TAKE 2 TABLETS TWICE DAILY WITH FOOD 08/22/24   [name], Aleksei V, MD  metoprolol  succinate (TOPROL -XL) 25 MG 24 hr tablet TAKE 2 TABLETS(50 MG) BY MOUTH DAILY 07/26/24   Waddell Danelle ORN, MD  oxyCODONE -acetaminophen  (PERCOCET/ROXICET) 5-325 MG tablet Take 1 tablet  by mouth every 6 (six) hours as needed for severe pain (pain score 7-10). 09/14/24   Odell Balls, PA-C  pantoprazole  (PROTONIX ) 40 MG tablet TAKE 1 TABLET(40 MG) BY MOUTH TWICE DAILY 01/27/24   [name], Aleksei V, MD  repaglinide  (PRANDIN ) 2 MG tablet TAKE 1 TABLET BY MOUTH THREE TIMES DAILY BEFORE MEALS 02/01/24   [name], Karlynn GAILS, MD  Semaglutide , 2 MG/DOSE, 8 MG/3ML SOPN 2 mg sq weekly 09/08/24   [name], Aleksei V, MD  spironolactone  (ALDACTONE ) 25 MG tablet Take 1 tablet (25 mg total) by mouth daily. 09/12/24   [name], Aleksei V, MD    Scheduled Meds:  aspirin   81 mg Oral Daily   [START ON 09/20/2024] atorvastatin   10 mg Oral Daily   [START ON 09/20/2024] buPROPion   150 mg Oral BID WC   [START ON 09/20/2024] citalopram   40 mg Oral Daily   enoxaparin  (LOVENOX ) injection  40 mg Subcutaneous Q24H   [START ON 09/20/2024] furosemide  40 mg Intravenous BID   insulin  aspart  0-5 Units Subcutaneous QHS   [START ON 09/20/2024] insulin  aspart  0-6 Units Subcutaneous TID WC   ketorolac   15 mg Intravenous STAT   [START ON 09/20/2024] losartan   25 mg Oral Daily   metoprolol  succinate  25 mg Oral BID   pantoprazole   40 mg Oral Daily   sodium chloride  flush  3 mL Intravenous Q12H   sodium chloride  flush  3 mL Intravenous Q12H   [START ON 09/20/2024] spironolactone   25 mg Oral Daily   Continuous Infusions:  sodium chloride      PRN Meds: sodium chloride , acetaminophen  **OR** acetaminophen , albuterol  **AND** budesonide, oxyCODONE -acetaminophen , sodium chloride  flush, trimethobenzamide  Allergies:    Allergies  Allergen Reactions   Adenosine      SOB and pressure for 20 seconds    Social History:   Social History   Socioeconomic History   Marital status: Married    Spouse name: Ronnie   Number of children: 2   Years of education: Not on file   Highest education level: Bachelor's degree (e.g., BA, AB, BS)  Occupational History   Occupation: EC teacher    Employer: [employer]   Tobacco Use   Smoking status: Some Days    Current packs/day: 0.50    Average packs/day: 0.5 packs/day for 15.0 years (7.5 ttl pk-yrs)    Types: Cigarettes   Smokeless tobacco: Never   Tobacco comments:    info given 12/14/2019  Vaping Use   Vaping status: Never Used  Substance and Sexual Activity   Alcohol use: No   Drug use: No   Sexual activity: Yes    Birth control/protection: Post-menopausal  Other Topics Concern   Not on file  Social History Narrative   Not on file   Social Drivers of Health   Financial Resource Strain: Low Risk  (07/03/2024)   Overall Financial  Resource Strain (CARDIA)    Difficulty of Paying Living Expenses: Not hard at all  Food Insecurity: No Food Insecurity (07/03/2024)   Hunger Vital Sign    Worried About Running Out of Food in the Last Year: Never true    Ran Out of Food in the Last Year: Never true  Transportation Needs: No Transportation Needs (07/03/2024)   PRAPARE - Administrator, Civil Service (Medical): No    Lack of Transportation (Non-Medical): No  Physical Activity: Inactive (07/03/2024)   Exercise Vital Sign    Days of Exercise per Week: 0 days    Minutes of Exercise per Session: Not on file  Stress: No Stress Concern Present (07/03/2024)   [employer] of Occupational Health - Occupational Stress Questionnaire    Feeling of Stress: Only a little  Social Connections: Socially Integrated (07/03/2024)   Social Connection and Isolation Panel    Frequency of Communication with Friends and Family: More than three times a week    Frequency of Social Gatherings with Friends and Family: Once a week    Attends Religious Services: 1 to 4 times per year    Active Member of [employer] or Organizations: Yes    Attends Banker Meetings: 1 to 4 times per year    Marital Status: Married  Catering manager Violence: Not At Risk (05/18/2024)   Humiliation, Afraid, Rape, and Kick questionnaire    Fear of Current or Ex-Partner:  No    Emotionally Abused: No    Physically Abused: No    Sexually Abused: No    Family History:    Family History  Problem Relation Age of Onset   Diabetes Mother    Hypertension Mother    Inflammatory bowel disease Maternal Aunt    Colon cancer Paternal Aunt    Colon polyps Maternal Grandmother    Hypertension Other    Liver cancer Neg Hx    Pancreatic cancer Neg Hx    Rectal cancer Neg Hx    Stomach cancer Neg Hx    Esophageal cancer Neg Hx      ROS:  Please see the history of present illness.   All other ROS reviewed and negative.     Physical Exam/Data: Vitals:   09/19/24 1629 09/19/24 1633 09/19/24 2000  BP: (!) 164/91    Pulse: (!) 119  (!) 102  Resp:  (!) 22 18  Temp: 99 F (37.2 C)  98.5 F (36.9 C)  SpO2: 96%  98%   No intake or output data in the 24 hours ending 09/19/24 2306    09/08/2024    3:03 PM 08/25/2024    3:41 PM 07/20/2024    6:03 AM  Last 3 Weights  Weight (lbs) 202 lb 9.6 oz 207 lb 200 lb  Weight (kg) 91.899 kg 93.895 kg 90.719 kg     There is no height or weight on file to calculate BMI.  General:  Well nourished, well developed, in no acute distress HEENT: normal Neck: +JVD Vascular: Distal pulses 2+ bilaterally Cardiac:  normal S1, S2; RRR; no murmur  Lungs:  Bibasilar crackles   Abd: soft, nontender Ext: +1 bilateral LE edema Musculoskeletal:  No deformities, BUE and BLE strength normal and equal Skin: warm and dry  Neuro:  CNs 2-12 intact, no focal abnormalities noted Psych:  Normal affect   EKG:  The EKG was personally reviewed and demonstrates:  sinus tachycardia with RBBB  Relevant CV Studies: TTE 04/2024:  1. Akinesis  of the distal anteroseptal wall and apex with overall mild to  moderate LV dysfunction.   2. Left ventricular ejection fraction, by estimation, is 40 to 45%. The  left ventricle has mild to moderately decreased function. The left  ventricle demonstrates regional wall motion abnormalities (see scoring   diagram/findings for description). There is   mild left ventricular hypertrophy. Left ventricular diastolic parameters  are consistent with Grade I diastolic dysfunction (impaired relaxation).   3. Right ventricular systolic function is normal. The right ventricular  size is normal.   4. The mitral valve is normal in structure. Trivial mitral valve  regurgitation. No evidence of mitral stenosis.   5. The aortic valve is tricuspid. Aortic valve regurgitation is not  visualized. Aortic valve sclerosis is present, with no evidence of aortic  valve stenosis.    CT-FFR 06/2024: 1. CT FFR analysis demonstrates discrete stenosis (<0.75) which is flow-limiting of the LAD, RCA and LCX.   2. Heavy calcification can sometimes cause over-estimation of flow limitation calculations.   3.  Definitive cardiac catheterization, however, is recommended.  Laboratory Data: High Sensitivity Troponin:   Recent Labs  Lab 09/19/24 1654 09/19/24 2000  TROPONINIHS 14 20*     Chemistry Recent Labs  Lab 09/14/24 2151 09/19/24 1654  NA 138 136  K 4.0 3.8  CL 102 102  CO2 23 22  GLUCOSE 127* 161*  BUN 9 5*  CREATININE 0.63 0.63  CALCIUM  9.2 9.0  GFRNONAA >60 >60  ANIONGAP 13 12    Recent Labs  Lab 09/14/24 2151  PROT 6.4*  ALBUMIN 3.8  AST 26  ALT 15  ALKPHOS 97  BILITOT 0.8   Lipids No results for input(s): CHOL, TRIG, HDL, LABVLDL, LDLCALC, CHOLHDL in the last 168 hours.  Hematology Recent Labs  Lab 09/14/24 2151 09/19/24 1654  WBC 11.2* 11.5*  RBC 4.47 4.80  HGB 11.1* 12.3  HCT 38.4 41.6  MCV 85.9 86.7  MCH 24.8* 25.6*  MCHC 28.9* 29.6*  RDW 14.4 14.4  PLT 284 339   Thyroid  No results for input(s): TSH, FREET4 in the last 168 hours.  BNP Recent Labs  Lab 09/14/24 2151 09/19/24 1655  BNP  --  687.3*  PROBNP 2,286.0*  --     DDimer No results for input(s): DDIMER in the last 168 hours.  Radiology/Studies:  CT Angio Chest PE W and/or Wo  Contrast Result Date: 09/19/2024 CLINICAL DATA:  Positive D-dimer. Chest pain. Concern for pulmonary disease. EXAM: CT ANGIOGRAPHY CHEST WITH CONTRAST TECHNIQUE: Multidetector CT imaging of the chest was performed using the standard protocol during bolus administration of intravenous contrast. Multiplanar CT image reconstructions and MIPs were obtained to evaluate the vascular anatomy. RADIATION DOSE REDUCTION: This exam was performed according to the departmental dose-optimization program which includes automated exposure control, adjustment of the mA and/or kV according to patient size and/or use of iterative reconstruction technique. CONTRAST:  75mL OMNIPAQUE  IOHEXOL  350 MG/ML SOLN COMPARISON:  Chest radiograph dated 09/19/2024. FINDINGS: Cardiovascular: There is no cardiomegaly. Small pericardial effusion measures approximately 7 mm in thickness. Three vessel coronary vascular calcification. Mild atherosclerotic calcification of the thoracic aorta. No aneurysmal dilatation. No pulmonary artery embolus identified. Mediastinum/Nodes: No hilar or mediastinal adenopathy. The esophagus and the thyroid  gland are grossly unremarkable. No mediastinal fluid collection. Lungs/Pleura: Small bilateral pleural effusions. There is partial compressive atelectasis of the lower lobes versus pneumonia. Patchy areas of airspace density in the upper lobes may represent edema, pneumonia, or combination. No pneumothorax. The central  airways are patent. Upper Abdomen: No acute abnormality. Musculoskeletal: No acute osseous pathology. Mild degenerative changes. Review of the MIP images confirms the above findings. IMPRESSION: 1. No CT evidence of pulmonary artery embolus. 2. Small bilateral pleural effusions with partial compressive atelectasis of the lower lobes versus pneumonia. 3. Patchy areas of airspace density in the upper lobes may represent edema, pneumonia, or combination. 4.  Aortic Atherosclerosis (ICD10-I70.0).  Electronically Signed   By: Vanetta Chou M.D.   On: 09/19/2024 21:11   DG Chest 2 View Result Date: 09/19/2024 CLINICAL DATA:  Chest pain. EXAM: CHEST - 2 VIEW COMPARISON:  Chest radiograph dated 09/14/2024. FINDINGS: There is mild cardiomegaly with vascular congestion and mild edema. Small bilateral pleural effusions and bibasilar atelectasis or infiltrate. No pneumothorax. No acute osseous pathology. IMPRESSION: Mild cardiomegaly with findings of CHF and small bilateral pleural effusions. Electronically Signed   By: Vanetta Chou M.D.   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 Volume overloaded on physical examination. CXR with pulmonary edema and small bilateral pleural effusion. BNP 687. Recent CT coronary angiogram with flow-limiting stenosis in the LAD, RCA, and LCX.  - Agree with IV Lasix 40 mg BID (monitor UOP response; she's not on diuretics at home) - Once she's euvolemic, will consider doing coronary angiogram while inpatient (currently she can't lay flat) - Continue GDMT with losartan  25 mg daily, metoprolol  succinate 50 mg daily, and spiro  - Continue ASA and atorvastatin  10 mg daily - Consider starting SGLT2i while inpatient   Risk Assessment/Risk Scores:    New [place]  Heart Association (NYHA) Functional Class NYHA Class II       For questions or updates, please contact [facility] Please consult [email or web address] for contact info under      Signed, Gillian CHRISTELLA Cass, MD  09/19/2024 11:06 PM

## 2024-09-20 ENCOUNTER — Inpatient Hospital Stay (HOSPITAL_COMMUNITY)

## 2024-09-20 DIAGNOSIS — R609 Edema, unspecified: Secondary | ICD-10-CM | POA: Diagnosis not present

## 2024-09-20 DIAGNOSIS — I509 Heart failure, unspecified: Secondary | ICD-10-CM | POA: Diagnosis not present

## 2024-09-20 DIAGNOSIS — I5023 Acute on chronic systolic (congestive) heart failure: Secondary | ICD-10-CM | POA: Diagnosis not present

## 2024-09-20 DIAGNOSIS — I5043 Acute on chronic combined systolic (congestive) and diastolic (congestive) heart failure: Secondary | ICD-10-CM

## 2024-09-20 LAB — COMPREHENSIVE METABOLIC PANEL WITH GFR
ALT: 16 U/L (ref 0–44)
AST: 20 U/L (ref 15–41)
Albumin: 3.2 g/dL — ABNORMAL LOW (ref 3.5–5.0)
Alkaline Phosphatase: 64 U/L (ref 38–126)
Anion gap: 12 (ref 5–15)
BUN: 7 mg/dL (ref 6–20)
CO2: 23 mmol/L (ref 22–32)
Calcium: 8.6 mg/dL — ABNORMAL LOW (ref 8.9–10.3)
Chloride: 103 mmol/L (ref 98–111)
Creatinine, Ser: 0.63 mg/dL (ref 0.44–1.00)
GFR, Estimated: >60 mL/min (ref >60)
Glucose, Bld: 161 mg/dL — ABNORMAL HIGH (ref 70–99)
Potassium: 3.5 mmol/L (ref 3.5–5.1)
Sodium: 138 mmol/L (ref 135–145)
Total Bilirubin: 0.8 mg/dL (ref 0.0–1.2)
Total Protein: 6.5 g/dL (ref 6.5–8.1)

## 2024-09-20 LAB — CBC
HCT: 37.5 % (ref 36.0–46.0)
Hemoglobin: 11.2 g/dL — ABNORMAL LOW (ref 12.0–15.0)
MCH: 25.6 pg — ABNORMAL LOW (ref 26.0–34.0)
MCHC: 29.9 g/dL — ABNORMAL LOW (ref 30.0–36.0)
MCV: 85.6 fL (ref 80.0–100.0)
Platelets: 247 K/uL (ref 150–400)
RBC: 4.38 MIL/uL (ref 3.87–5.11)
RDW: 14.3 % (ref 11.5–15.5)
WBC: 6.4 K/uL (ref 4.0–10.5)
nRBC: 0 % (ref 0.0–0.2)

## 2024-09-20 LAB — CBG MONITORING, ED
Glucose-Capillary: 119 mg/dL — ABNORMAL HIGH (ref 70–99)
Glucose-Capillary: 126 mg/dL — ABNORMAL HIGH (ref 70–99)
Glucose-Capillary: 150 mg/dL — ABNORMAL HIGH (ref 70–99)

## 2024-09-20 LAB — BASIC METABOLIC PANEL WITH GFR
Anion gap: 13 (ref 5–15)
BUN: 9 mg/dL (ref 6–20)
CO2: 25 mmol/L (ref 22–32)
Calcium: 8.8 mg/dL — ABNORMAL LOW (ref 8.9–10.3)
Chloride: 99 mmol/L (ref 98–111)
Creatinine, Ser: 0.69 mg/dL (ref 0.44–1.00)
GFR, Estimated: >60 mL/min (ref >60)
Glucose, Bld: 164 mg/dL — ABNORMAL HIGH (ref 70–99)
Potassium: 3.5 mmol/L (ref 3.5–5.1)
Sodium: 137 mmol/L (ref 135–145)

## 2024-09-20 LAB — ECHOCARDIOGRAM COMPLETE
AR max vel: 1.39 cm2
AV Area VTI: 1.27 cm2
AV Area mean vel: 1.33 cm2
AV Mean grad: 7.8 mmHg
AV Peak grad: 15.4 mmHg
Ao pk vel: 1.96 m/s
Area-P 1/2: 4.57 cm2
Calc EF: 28 %
S' Lateral: 4.4 cm
Single Plane A2C EF: 29.5 %
Single Plane A4C EF: 26.3 %

## 2024-09-20 LAB — LIPID PANEL
Cholesterol: 131 mg/dL (ref 0–200)
HDL: 31 mg/dL — ABNORMAL LOW
LDL Cholesterol: 51 mg/dL (ref 0–99)
Total CHOL/HDL Ratio: 4.2 ratio
Triglycerides: 246 mg/dL — ABNORMAL HIGH
VLDL: 49 mg/dL — ABNORMAL HIGH (ref 0–40)

## 2024-09-20 LAB — GLUCOSE, CAPILLARY
Glucose-Capillary: 180 mg/dL — ABNORMAL HIGH (ref 70–99)
Glucose-Capillary: 139 mg/dL — ABNORMAL HIGH (ref 70–99)

## 2024-09-20 LAB — MAGNESIUM: Magnesium: 1.6 mg/dL — ABNORMAL LOW (ref 1.7–2.4)

## 2024-09-20 MED ORDER — PERFLUTREN LIPID MICROSPHERE
1.0000 mL | INTRAVENOUS | Status: AC | PRN
  Administered 2024-09-20: 2 mL via INTRAVENOUS

## 2024-09-20 MED ORDER — ASPIRIN 81 MG PO CHEW
81.0000 mg | CHEWABLE_TABLET | ORAL | Status: AC
  Administered 2024-09-21: 81 mg via ORAL
  Filled 2024-09-20: qty 1

## 2024-09-20 MED ORDER — ACETAMINOPHEN 500 MG PO TABS: 1000.0000 mg | ORAL_TABLET | Freq: Three times a day (TID) | ORAL | Status: DC

## 2024-09-20 MED ORDER — FREE WATER
250.0000 mL | Freq: Once | Status: AC
  Administered 2024-09-21: 250 mL via ORAL

## 2024-09-20 MED ORDER — ACETAMINOPHEN 325 MG PO TABS
650.0000 mg | ORAL_TABLET | Freq: Three times a day (TID) | ORAL | Status: DC
  Administered 2024-09-20 – 2024-09-22 (×5): 650 mg via ORAL
  Filled 2024-09-20 (×5): qty 2

## 2024-09-20 MED ORDER — LIDOCAINE 5 % EX PTCH
2.0000 | MEDICATED_PATCH | CUTANEOUS | Status: DC
  Administered 2024-09-20: 2 via TRANSDERMAL
  Filled 2024-09-20 (×3): qty 2

## 2024-09-20 NOTE — ED Notes (Signed)
 IV moved from R. AC to L. Forearm per pt request.

## 2024-09-20 NOTE — Progress Notes (Signed)
 Venous duplex lower extremities completed. Refer to [facility] under chart review to view preliminary results.   09/20/2024  10:16 AM Aastha Dayley, Ricka BIRCH

## 2024-09-20 NOTE — ED Notes (Signed)
 Pt has returned back to room.

## 2024-09-20 NOTE — ED Notes (Signed)
 Pt ambulating to restroom with steady gait. No incident
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
## 2024-09-20 NOTE — Progress Notes (Signed)
 Rounding Note   Patient Name: [name] [name] Date of Encounter: 09/20/2024  [facility] Cardiologist: None   Subjective - The patient was seen overnight by the on-call cardiology fellow.  I am meeting the patient for the first time this morning. - The patient reports having 2 weeks of SOB plus progressive lower extremity edema.  Also has lower rib pain that she attributes to receiving the Heimlich maneuver by her husband while choking on some food. - She underwent coronary CTA in July for chest pain and mildly reduced LVEF which was positive. - Today she states that she still has SOB and has trouble laying flat primarily due to rib pain. - She denies active chest pain, palpitations, syncope  Scheduled Meds:  aspirin   81 mg Oral Daily   atorvastatin   10 mg Oral Daily   buPROPion   150 mg Oral BID WC   citalopram   40 mg Oral Daily   enoxaparin  (LOVENOX ) injection  40 mg Subcutaneous Q24H   furosemide  40 mg Intravenous BID   insulin  aspart  0-5 Units Subcutaneous QHS   insulin  aspart  0-6 Units Subcutaneous TID WC   losartan   25 mg Oral Daily   metoprolol  succinate  25 mg Oral BID   pantoprazole   40 mg Oral Daily   sodium chloride  flush  3 mL Intravenous Q12H   sodium chloride  flush  3 mL Intravenous Q12H   spironolactone   25 mg Oral Daily   Continuous Infusions:  sodium chloride      PRN Meds: sodium chloride , acetaminophen  **OR** acetaminophen , albuterol  **AND** budesonide, oxyCODONE -acetaminophen , sodium chloride  flush, trimethobenzamide   Vital Signs  Vitals:   09/20/24 0400 09/20/24 0403 09/20/24 0500 09/20/24 0600  BP: (!) 107/56  127/72 115/61  Pulse: 87  91 89  Resp: 16  16 17   Temp:  97.8 F (36.6 C)    TempSrc:  Axillary    SpO2: 92%  97% 95%   No intake or output data in the 24 hours ending 09/20/24 0819    09/08/2024    3:03 PM 08/25/2024    3:41 PM 07/20/2024    6:03 AM  Last 3 Weights  Weight (lbs) 202 lb 9.6 oz 207 lb 200 lb  Weight (kg)  91.899 kg 93.895 kg 90.719 kg      Telemetry NSR with RBBB, occasional PVCs- Personally Reviewed  ECG  NSR with RBBB- Personally Reviewed  Physical Exam GEN: Sitting upright in bed in no acute distress, very pleasant Neck: JVD elevated to tragus at 45 degrees Cardiac: RRR, no murmurs, rubs, or gallops.  Tenderness to palpation along the right costophrenic angle Respiratory: Rales heard in the bilateral lung bases, no wheezes or rhonchi GI: Soft, nontender, non-distended, normal BS MS: 2+ pitting edema bilaterally to shins; No deformity. Neuro:  Nonfocal  Psych: Normal affect   Labs High Sensitivity Troponin:   Recent Labs  Lab 09/19/24 1654 09/19/24 2000  TROPONINIHS 14 20*     Chemistry Recent Labs  Lab 09/14/24 2151 09/19/24 1654 09/20/24 0547  NA 138 136 138  K 4.0 3.8 3.5  CL 102 102 103  CO2 23 22 23   GLUCOSE 127* 161* 161*  BUN 9 5* 7  CREATININE 0.63 0.63 0.63  CALCIUM  9.2 9.0 8.6*  PROT 6.4*  --  6.5  ALBUMIN 3.8  --  3.2*  AST 26  --  20  ALT 15  --  16  ALKPHOS 97  --  64  BILITOT 0.8  --  0.8  GFRNONAA >60 >60 >60  ANIONGAP 13 12 12     Lipids  Recent Labs  Lab 09/20/24 0547  CHOL 131  TRIG 246*  HDL 31*  LDLCALC 51  CHOLHDL 4.2    Hematology Recent Labs  Lab 09/14/24 2151 09/19/24 1654 09/20/24 0547  WBC 11.2* 11.5* 6.4  RBC 4.47 4.80 4.38  HGB 11.1* 12.3 11.2*  HCT 38.4 41.6 37.5  MCV 85.9 86.7 85.6  MCH 24.8* 25.6* 25.6*  MCHC 28.9* 29.6* 29.9*  RDW 14.4 14.4 14.3  PLT 284 339 247   Thyroid  No results for input(s): TSH, FREET4 in the last 168 hours.  BNP Recent Labs  Lab 09/14/24 2151 09/19/24 1655  BNP  --  687.3*  PROBNP 2,286.0*  --     DDimer No results for input(s): DDIMER in the last 168 hours.   Radiology  CT Angio Chest PE W and/or Wo Contrast Result Date: 09/19/2024 CLINICAL DATA:  Positive D-dimer. Chest pain. Concern for pulmonary disease. EXAM: CT ANGIOGRAPHY CHEST WITH CONTRAST TECHNIQUE:  Multidetector CT imaging of the chest was performed using the standard protocol during bolus administration of intravenous contrast. Multiplanar CT image reconstructions and MIPs were obtained to evaluate the vascular anatomy. RADIATION DOSE REDUCTION: This exam was performed according to the departmental dose-optimization program which includes automated exposure control, adjustment of the mA and/or kV according to patient size and/or use of iterative reconstruction technique. CONTRAST:  75mL OMNIPAQUE  IOHEXOL  350 MG/ML SOLN COMPARISON:  Chest radiograph dated 09/19/2024. FINDINGS: Cardiovascular: There is no cardiomegaly. Small pericardial effusion measures approximately 7 mm in thickness. Three vessel coronary vascular calcification. Mild atherosclerotic calcification of the thoracic aorta. No aneurysmal dilatation. No pulmonary artery embolus identified. Mediastinum/Nodes: No hilar or mediastinal adenopathy. The esophagus and the thyroid  gland are grossly unremarkable. No mediastinal fluid collection. Lungs/Pleura: Small bilateral pleural effusions. There is partial compressive atelectasis of the lower lobes versus pneumonia. Patchy areas of airspace density in the upper lobes may represent edema, pneumonia, or combination. No pneumothorax. The central airways are patent. Upper Abdomen: No acute abnormality. Musculoskeletal: No acute osseous pathology. Mild degenerative changes. Review of the MIP images confirms the above findings. IMPRESSION: 1. No CT evidence of pulmonary artery embolus. 2. Small bilateral pleural effusions with partial compressive atelectasis of the lower lobes versus pneumonia. 3. Patchy areas of airspace density in the upper lobes may represent edema, pneumonia, or combination. 4.  Aortic Atherosclerosis (ICD10-I70.0). Electronically Signed   By: Vanetta Chou M.D.   On: 09/19/2024 21:11   DG Chest 2 View Result Date: 09/19/2024 CLINICAL DATA:  Chest pain. EXAM: CHEST - 2 VIEW  COMPARISON:  Chest radiograph dated 09/14/2024. FINDINGS: There is mild cardiomegaly with vascular congestion and mild edema. Small bilateral pleural effusions and bibasilar atelectasis or infiltrate. No pneumothorax. No acute osseous pathology. IMPRESSION: Mild cardiomegaly with findings of CHF and small bilateral pleural effusions. Electronically Signed   By: Vanetta Chou M.D.   On: 09/19/2024 17:23    Cardiac Studies  TTE 05/15/24:  IMPRESSIONS     1. Akinesis of the distal anteroseptal wall and apex with overall mild to  moderate LV dysfunction.   2. Left ventricular ejection fraction, by estimation, is 40 to 45%. The  left ventricle has mild to moderately decreased function. The left  ventricle demonstrates regional wall motion abnormalities (see scoring  diagram/findings for description). There is   mild left ventricular hypertrophy. Left ventricular diastolic parameters  are consistent with Grade I diastolic dysfunction (impaired  relaxation).   3. Right ventricular systolic function is normal. The right ventricular  size is normal.   4. The mitral valve is normal in structure. Trivial mitral valve  regurgitation. No evidence of mitral stenosis.   5. The aortic valve is tricuspid. Aortic valve regurgitation is not  visualized. Aortic valve sclerosis is present, with no evidence of aortic  valve stenosis.   CCTA 06/22/24:  IMPRESSION: 1. Heavy coronary calcification with moderate to likely severe CAD, CADRADS = 4v. CT FFR will be performed and reported separately.   2. Normal coronary origin with right dominance.   3. Coronary artery calcium  score is 2781, which places the patient in the 99th percentile for age/race and sex-matched controls (MESA).   4. Total plaque volume 2026 mm3, of which 690 mm3 (34%) is calcified plaque and 1373mm3 (66%) is non-calcified plaque. 18 mm3 (1%) is considered low attenuation. TPV is extensive.   5. Aggressvie risk factor modification,  including lipid lowering to LDL <50 if possible is recommended. Consider anti-inflammatory therapies such as colchicine and/or icosapent ethyl.   6. Cardiac catheterization may be warranted.  Patient Profile   Mrs. Cho is a 58 y.o. female with a PMH of coronary/aortic calcifications, HFmrEF, AT s/p ablation, HLD, DM2 who presented with the CC of SOB/swelling.  Cardiology is consulted for the evaluation and management of decompensated CHF.  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  She is very volume up by physical exam.  She will need aggressive diuresis and a repeat echocardiogram to ensure stability of her EF.  She further reports having chest pain in the setting of mechanical injury; however, has also had prior episodes of chest pain that led to an outpatient LHC being scheduled.  I think that it is important that we pursue both LHC/RHC while the patient is hospitalized given her decompensated state.  I will diurese her so that she can lay flat and work on pain management with tentative plan for catheterization tomorrow. - Continue aggressive diuresis - Strict I's and O's - Daily weights - Complete echo - LHC/RHC tentatively tomorrow - N.p.o. at midnight -Continue GDMT: Metoprolol  succinate 25 mg twice daily, spironolactone  25 mg daily, losartan  25 mg daily - Scheduled Tylenol  650 mg q8h - Lidocaine patches to rib cage - Will need to consider SGLT2 inhibitor this hospitalization  #HLD :: Lipid panel this admission with LDL 51 but triglycerides are markedly elevated.  Unclear if this was fasting.  Can repeat triglyceride levels while fasting. - Repeat triglycerides - Continue atorvastatin  10 mg daily  #DM2 - Insulin  per primary team    For questions or updates, please contact [facility] Please consult [email or web address] for contact info under       Signed, Georganna Archer, MD   09/20/2024, 8:19 AM

## 2024-09-20 NOTE — ED Notes (Signed)
 Pt sitting on side of bed upon entering room. Pt denies any pain or needs at this time.

## 2024-09-21 ENCOUNTER — Other Ambulatory Visit: Payer: Self-pay

## 2024-09-21 ENCOUNTER — Encounter (HOSPITAL_COMMUNITY): Payer: Self-pay | Admitting: Internal Medicine

## 2024-09-21 ENCOUNTER — Telehealth (HOSPITAL_COMMUNITY): Payer: Self-pay | Admitting: Pharmacy Technician

## 2024-09-21 ENCOUNTER — Encounter (HOSPITAL_COMMUNITY)
Admission: EM | Disposition: A | Discharge status: Home / Self Care | Payer: Self-pay | Source: Home / Self Care | Attending: Internal Medicine

## 2024-09-21 ENCOUNTER — Other Ambulatory Visit (HOSPITAL_COMMUNITY): Payer: Self-pay

## 2024-09-21 ENCOUNTER — Ambulatory Visit (HOSPITAL_COMMUNITY): Admission: RE | Admit: 2024-09-21 | Source: Home / Self Care | Admitting: Cardiology

## 2024-09-21 ENCOUNTER — Encounter (HOSPITAL_COMMUNITY): Payer: Self-pay | Admitting: Pharmacy Technician

## 2024-09-21 DIAGNOSIS — N189 Chronic kidney disease, unspecified: Secondary | ICD-10-CM

## 2024-09-21 DIAGNOSIS — E781 Pure hyperglyceridemia: Secondary | ICD-10-CM

## 2024-09-21 DIAGNOSIS — E782 Mixed hyperlipidemia: Secondary | ICD-10-CM

## 2024-09-21 DIAGNOSIS — E785 Hyperlipidemia, unspecified: Secondary | ICD-10-CM

## 2024-09-21 DIAGNOSIS — I2583 Coronary atherosclerosis due to lipid rich plaque: Secondary | ICD-10-CM

## 2024-09-21 DIAGNOSIS — R0789 Other chest pain: Secondary | ICD-10-CM

## 2024-09-21 DIAGNOSIS — J4521 Mild intermittent asthma with (acute) exacerbation: Secondary | ICD-10-CM

## 2024-09-21 DIAGNOSIS — I2510 Atherosclerotic heart disease of native coronary artery without angina pectoris: Secondary | ICD-10-CM

## 2024-09-21 DIAGNOSIS — E1169 Type 2 diabetes mellitus with other specified complication: Secondary | ICD-10-CM

## 2024-09-21 DIAGNOSIS — I5023 Acute on chronic systolic (congestive) heart failure: Secondary | ICD-10-CM

## 2024-09-21 DIAGNOSIS — K219 Gastro-esophageal reflux disease without esophagitis: Secondary | ICD-10-CM

## 2024-09-21 DIAGNOSIS — E1159 Type 2 diabetes mellitus with other circulatory complications: Secondary | ICD-10-CM

## 2024-09-21 DIAGNOSIS — Z720 Tobacco use: Secondary | ICD-10-CM

## 2024-09-21 HISTORY — PX: CORONARY PRESSURE/FFR WITH 3D MAPPING: CATH118309

## 2024-09-21 HISTORY — PX: RIGHT/LEFT HEART CATH AND CORONARY ANGIOGRAPHY: CATH118266

## 2024-09-21 LAB — CBC
HCT: 39.5 % (ref 36.0–46.0)
Hemoglobin: 12 g/dL (ref 12.0–15.0)
MCH: 25.4 pg — ABNORMAL LOW (ref 26.0–34.0)
MCHC: 30.4 g/dL (ref 30.0–36.0)
MCV: 83.7 fL (ref 80.0–100.0)
Platelets: 271 K/uL (ref 150–400)
RBC: 4.72 MIL/uL (ref 3.87–5.11)
RDW: 14.2 % (ref 11.5–15.5)
WBC: 6.7 K/uL (ref 4.0–10.5)
nRBC: 0 % (ref 0.0–0.2)

## 2024-09-21 LAB — POCT I-STAT EG7
Acid-Base Excess: 4 mmol/L — ABNORMAL HIGH (ref 0.0–2.0)
Acid-Base Excess: 5 mmol/L — ABNORMAL HIGH (ref 0.0–2.0)
Bicarbonate: 29 mmol/L — ABNORMAL HIGH (ref 20.0–28.0)
Bicarbonate: 29.8 mmol/L — ABNORMAL HIGH (ref 20.0–28.0)
Calcium, Ion: 1.17 mmol/L (ref 1.15–1.40)
Calcium, Ion: 1.18 mmol/L (ref 1.15–1.40)
HCT: 37 % (ref 36.0–46.0)
HCT: 37 % (ref 36.0–46.0)
Hemoglobin: 12.6 g/dL (ref 12.0–15.0)
Hemoglobin: 12.6 g/dL (ref 12.0–15.0)
O2 Saturation: 58 %
O2 Saturation: 64 %
Potassium: 3.4 mmol/L — ABNORMAL LOW (ref 3.5–5.1)
Potassium: 3.4 mmol/L — ABNORMAL LOW (ref 3.5–5.1)
Sodium: 140 mmol/L (ref 135–145)
Sodium: 140 mmol/L (ref 135–145)
TCO2: 30 mmol/L (ref 22–32)
TCO2: 31 mmol/L (ref 22–32)
pCO2, Ven: 42.5 mmHg — ABNORMAL LOW (ref 44–60)
pCO2, Ven: 43.5 mmHg — ABNORMAL LOW (ref 44–60)
pH, Ven: 7.432 — ABNORMAL HIGH (ref 7.25–7.43)
pH, Ven: 7.453 — ABNORMAL HIGH (ref 7.25–7.43)
pO2, Ven: 29 mmHg — CL (ref 32–45)
pO2, Ven: 32 mmHg (ref 32–45)

## 2024-09-21 LAB — BASIC METABOLIC PANEL WITH GFR
Anion gap: 12 (ref 5–15)
BUN: 12 mg/dL (ref 6–20)
CO2: 25 mmol/L (ref 22–32)
Calcium: 8.8 mg/dL — ABNORMAL LOW (ref 8.9–10.3)
Chloride: 100 mmol/L (ref 98–111)
Creatinine, Ser: 0.59 mg/dL (ref 0.44–1.00)
GFR, Estimated: >60 mL/min (ref >60)
Glucose, Bld: 172 mg/dL — ABNORMAL HIGH (ref 70–99)
Potassium: 3.8 mmol/L (ref 3.5–5.1)
Sodium: 137 mmol/L (ref 135–145)

## 2024-09-21 LAB — GLUCOSE, CAPILLARY
Glucose-Capillary: 161 mg/dL — ABNORMAL HIGH (ref 70–99)
Glucose-Capillary: 121 mg/dL — ABNORMAL HIGH (ref 70–99)
Glucose-Capillary: 160 mg/dL — ABNORMAL HIGH (ref 70–99)
Glucose-Capillary: 153 mg/dL — ABNORMAL HIGH (ref 70–99)

## 2024-09-21 LAB — IRON AND TIBC
Iron: 28 ug/dL (ref 28–170)
Saturation Ratios: 8 % — ABNORMAL LOW (ref 10.4–31.8)
TIBC: 372 ug/dL (ref 250–450)
UIBC: 344 ug/dL

## 2024-09-21 LAB — PROTIME-INR
INR: 1 (ref 0.8–1.2)
Prothrombin Time: 13.7 s (ref 11.4–15.2)

## 2024-09-21 LAB — POCT I-STAT 7, (LYTES, BLD GAS, ICA,H+H)
Acid-Base Excess: 3 mmol/L — ABNORMAL HIGH (ref 0.0–2.0)
Bicarbonate: 26.9 mmol/L (ref 20.0–28.0)
Calcium, Ion: 1.19 mmol/L (ref 1.15–1.40)
HCT: 37 % (ref 36.0–46.0)
Hemoglobin: 12.6 g/dL (ref 12.0–15.0)
O2 Saturation: 89 %
Potassium: 3.5 mmol/L (ref 3.5–5.1)
Sodium: 139 mmol/L (ref 135–145)
TCO2: 28 mmol/L (ref 22–32)
pCO2 arterial: 38.9 mmHg (ref 32–48)
pH, Arterial: 7.447 (ref 7.35–7.45)
pO2, Arterial: 54 mmHg — ABNORMAL LOW (ref 83–108)

## 2024-09-21 LAB — TROPONIN I (HIGH SENSITIVITY)
Troponin I (High Sensitivity): 30 ng/L — ABNORMAL HIGH (ref <18)
Troponin I (High Sensitivity): 31 ng/L — ABNORMAL HIGH (ref <18)

## 2024-09-21 LAB — APTT: aPTT: 31 s (ref 24–36)

## 2024-09-21 LAB — FERRITIN: Ferritin: 20 ng/mL (ref 11–307)

## 2024-09-21 LAB — MAGNESIUM: Magnesium: 1.7 mg/dL (ref 1.7–2.4)

## 2024-09-21 LAB — TRIGLYCERIDES: Triglycerides: 306 mg/dL — ABNORMAL HIGH (ref <150)

## 2024-09-21 SURGERY — RIGHT/LEFT HEART CATH AND CORONARY ANGIOGRAPHY
Anesthesia: LOCAL

## 2024-09-21 MED ORDER — NITROGLYCERIN 0.4 MG SL SUBL
SUBLINGUAL_TABLET | SUBLINGUAL | Status: AC
  Filled 2024-09-21: qty 1

## 2024-09-21 MED ORDER — HEPARIN SODIUM (PORCINE) 1000 UNIT/ML IJ SOLN
INTRAMUSCULAR | Status: DC | PRN
  Administered 2024-09-21: 4000 [IU] via INTRAVENOUS

## 2024-09-21 MED ORDER — NITROGLYCERIN 0.4 MG SL SUBL
0.4000 mg | SUBLINGUAL_TABLET | SUBLINGUAL | Status: DC | PRN
  Administered 2024-09-21: 0.4 mg via SUBLINGUAL

## 2024-09-21 MED ORDER — DAPAGLIFLOZIN PROPANEDIOL 10 MG PO TABS
10.0000 mg | ORAL_TABLET | Freq: Every day | ORAL | Status: DC
  Administered 2024-09-21 – 2024-09-22 (×2): 10 mg via ORAL
  Filled 2024-09-21 (×2): qty 1

## 2024-09-21 MED ORDER — ATORVASTATIN CALCIUM 80 MG PO TABS
80.0000 mg | ORAL_TABLET | Freq: Every day | ORAL | Status: DC
  Administered 2024-09-21 – 2024-09-22 (×2): 80 mg via ORAL
  Filled 2024-09-21 (×2): qty 1

## 2024-09-21 MED ORDER — FENTANYL CITRATE (PF) 100 MCG/2ML IJ SOLN
INTRAMUSCULAR | Status: DC | PRN
  Administered 2024-09-21: 50 ug via INTRAVENOUS

## 2024-09-21 MED ORDER — MIDAZOLAM HCL 2 MG/2ML IJ SOLN
INTRAMUSCULAR | Status: AC
  Filled 2024-09-21: qty 2

## 2024-09-21 MED ORDER — IOHEXOL 350 MG/ML SOLN
INTRAVENOUS | Status: DC | PRN
  Administered 2024-09-21: 30 mL

## 2024-09-21 MED ORDER — MIDAZOLAM HCL 2 MG/2ML IJ SOLN
INTRAMUSCULAR | Status: DC | PRN
  Administered 2024-09-21: 1 mg via INTRAVENOUS

## 2024-09-21 MED ORDER — FENTANYL CITRATE (PF) 100 MCG/2ML IJ SOLN
INTRAMUSCULAR | Status: AC
  Filled 2024-09-21: qty 2

## 2024-09-21 MED ORDER — LIDOCAINE HCL (PF) 1 % IJ SOLN
INTRAMUSCULAR | Status: AC
  Filled 2024-09-21: qty 30

## 2024-09-21 MED ORDER — HEPARIN (PORCINE) IN NACL 1000-0.9 UT/500ML-% IV SOLN
INTRAVENOUS | Status: DC | PRN
  Administered 2024-09-21 (×2): 500 mL

## 2024-09-21 MED ORDER — VERAPAMIL HCL 2.5 MG/ML IV SOLN
INTRAVENOUS | Status: DC | PRN
  Administered 2024-09-21: 10 mL via INTRA_ARTERIAL

## 2024-09-21 MED ORDER — VERAPAMIL HCL 2.5 MG/ML IV SOLN
INTRAVENOUS | Status: AC
  Filled 2024-09-21: qty 2

## 2024-09-21 MED ORDER — HEPARIN SODIUM (PORCINE) 1000 UNIT/ML IJ SOLN
INTRAMUSCULAR | Status: AC
  Filled 2024-09-21: qty 10

## 2024-09-21 MED ORDER — LIDOCAINE HCL (PF) 1 % IJ SOLN
INTRAMUSCULAR | Status: DC | PRN
  Administered 2024-09-21: 2 mL via INTRADERMAL

## 2024-09-21 MED ORDER — METOPROLOL TARTRATE 25 MG PO TABS
25.0000 mg | ORAL_TABLET | Freq: Two times a day (BID) | ORAL | Status: DC
  Administered 2024-09-21 (×2): 25 mg via ORAL
  Filled 2024-09-21 (×2): qty 1

## 2024-09-21 SURGICAL SUPPLY — 9 items
CARD KEY FFR CATHWORX (MISCELLANEOUS) IMPLANT
CATH BALLN WEDGE 5F 110CM (CATHETERS) IMPLANT
CATH INFINITI AMBI 5FR TG (CATHETERS) IMPLANT
DEVICE RAD COMP TR BAND LRG (VASCULAR PRODUCTS) IMPLANT
GLIDESHEATH SLEND A-KIT 6F 22G (SHEATH) IMPLANT
GUIDEWIRE INQWIRE 1.5J.035X260 (WIRE) IMPLANT
PACK CARDIAC CATHETERIZATION (CUSTOM PROCEDURE TRAY) ×1 IMPLANT
SET ATX-X65L (MISCELLANEOUS) IMPLANT
SHEATH GLIDE SLENDER 4/5FR (SHEATH) IMPLANT

## 2024-09-21 NOTE — Assessment & Plan Note (Signed)
 Echocardiogram with reduced LV systolic function with EF 25 to 30%, mild to moderate LV cavity dilatation, grade I diastolic dysfunction, (impaired relaxation), RV systolic function preserved, LA and RA with mild dilatation, moderate pericardial effusion, circumferential with no tamponade, mild mitral valve regurgitation, mild tricuspid valve regurgitation.   10/01 cardiac catheterization  RA 11 PA 55/30 mean 39  PCWP 32   Pre and post capillary pulmonary hypertension   Urine output is 1,900 ml Systolic blood pressure 120 mmHg range.   Plan to continue dapagliflozin, losartan , metoprolol  and spironolactone .  Diuresis with furosemide 40 mg IV bid.

## 2024-09-21 NOTE — Assessment & Plan Note (Signed)
Continue with bupropion.  ?

## 2024-09-21 NOTE — H&P (View-Only) (Signed)
 Rounding Note   Patient Name: [name] [name] Date of Encounter: 09/21/2024  [facility] Cardiologist: None   Subjective - Overnight the patient had an episode of chest pressure that lasted for several minutes.  Initial troponin 30 with repeat pending.  She reports ongoing SOB that is stable. - Currently no chest pain or other symptoms. - She was net -1.7 L overnight - N.p.o. since midnight  Scheduled Meds:  acetaminophen   650 mg Oral Q8H   aspirin   81 mg Oral Daily   atorvastatin   80 mg Oral Daily   buPROPion   150 mg Oral BID WC   citalopram   40 mg Oral Daily   enoxaparin  (LOVENOX ) injection  40 mg Subcutaneous Q24H   furosemide  40 mg Intravenous BID   insulin  aspart  0-5 Units Subcutaneous QHS   insulin  aspart  0-6 Units Subcutaneous TID WC   lidocaine  2 patch Transdermal Q24H   losartan   25 mg Oral Daily   metoprolol  tartrate  25 mg Oral BID   nitroGLYCERIN        pantoprazole   40 mg Oral Daily   sodium chloride  flush  3 mL Intravenous Q12H   sodium chloride  flush  3 mL Intravenous Q12H   spironolactone   25 mg Oral Daily   Continuous Infusions:  PRN Meds: albuterol  **AND** budesonide, nitroGLYCERIN , nitroGLYCERIN , oxyCODONE -acetaminophen , sodium chloride  flush, trimethobenzamide   Vital Signs  Vitals:   09/21/24 0406 09/21/24 0745 09/21/24 0750 09/21/24 0812  BP: 138/84  135/86   Pulse: 97 93    Resp: 20 18    Temp: 98.2 F (36.8 C) 97.8 F (36.6 C)    TempSrc: Oral Oral    SpO2: 95% 98%  97%  Weight: 89.3 kg     Height:        Intake/Output Summary (Last 24 hours) at 09/21/2024 1009 Last data filed at 09/21/2024 0807 Gross per 24 hour  Intake 313 ml  Output 1900 ml  Net -1587 ml      09/21/2024    4:06 AM 09/20/2024    3:52 PM 09/08/2024    3:03 PM  Last 3 Weights  Weight (lbs) 196 lb 13.9 oz 199 lb 15.3 oz 202 lb 9.6 oz  Weight (kg) 89.3 kg 90.7 kg 91.899 kg      Telemetry Short runs of SVT, otherwise NSR- Personally Reviewed  ECG   NSR, RBBB, LVH- Personally Reviewed  Physical Exam  GEN: Very pleasant well-appearing female in no acute distress.   Neck: No JVD Cardiac: RRR, no murmurs, rubs, or gallops.  Respiratory: Faint bibasilar rales, no wheezes or rhonchi. GI: Soft, nontender, non-distended  MS: No edema; No deformity. Neuro:  Nonfocal  Psych: Normal affect   Labs High Sensitivity Troponin:   Recent Labs  Lab 09/19/24 1654 09/19/24 2000 09/21/24 0628  TROPONINIHS 14 20* 30*     Chemistry Recent Labs  Lab 09/14/24 2151 09/19/24 1654 09/20/24 0547 09/20/24 1330 09/21/24 0318  NA 138   < > 138 137 137  K 4.0   < > 3.5 3.5 3.8  CL 102   < > 103 99 100  CO2 23   < > 23 25 25   GLUCOSE 127*   < > 161* 164* 172*  BUN 9   < > 7 9 12   CREATININE 0.63   < > 0.63 0.69 0.59  CALCIUM  9.2   < > 8.6* 8.8* 8.8*  MG  --   --   --  1.6* 1.7  PROT 6.4*  --  6.5  --   --   ALBUMIN 3.8  --  3.2*  --   --   AST 26  --  20  --   --   ALT 15  --  16  --   --   ALKPHOS 97  --  64  --   --   BILITOT 0.8  --  0.8  --   --   GFRNONAA >60   < > >60 >60 >60  ANIONGAP 13   < > 12 13 12    < > = values in this interval not displayed.    Lipids  Recent Labs  Lab 09/20/24 0547 09/21/24 0318  CHOL 131  --   TRIG 246* 306*  HDL 31*  --   LDLCALC 51  --   CHOLHDL 4.2  --     Hematology Recent Labs  Lab 09/19/24 1654 09/20/24 0547 09/21/24 0318  WBC 11.5* 6.4 6.7  RBC 4.80 4.38 4.72  HGB 12.3 11.2* 12.0  HCT 41.6 37.5 39.5  MCV 86.7 85.6 83.7  MCH 25.6* 25.6* 25.4*  MCHC 29.6* 29.9* 30.4  RDW 14.4 14.3 14.2  PLT 339 247 271   Thyroid  No results for input(s): TSH, FREET4 in the last 168 hours.  BNP Recent Labs  Lab 09/14/24 2151 09/19/24 1655  BNP  --  687.3*  PROBNP 2,286.0*  --     DDimer No results for input(s): DDIMER in the last 168 hours.   Radiology  VAS US  LOWER EXTREMITY VENOUS (DVT) Result Date: 09/20/2024  Lower Venous DVT Study Patient Name:  [name] [name]  Date of  Exam:   09/20/2024 Medical Rec #: [id number]        Accession #:    [id number] Date of Birth: November 13, 1966       Patient Gender: F Patient Age:   58 years Exam Location:  [facility] Procedure:      VAS US  LOWER EXTREMITY VENOUS (DVT) Referring Phys: MICAELA SPEAKER --------------------------------------------------------------------------------  Indications: Swelling. Other Indications: SOB, BLE swelling, chest pain and right rib pain. Pt states                    right rib pain x 1 week. Comparison Study: No recent studies Performing Technologist: Ricka Sturdivant-Jones RDMS, RVT  Examination Guidelines: A complete evaluation includes B-mode imaging, spectral Doppler, color Doppler, and power Doppler as needed of all accessible portions of each vessel. Bilateral testing is considered an integral part of a complete examination. Limited examinations for reoccurring indications may be performed as noted. The reflux portion of the exam is performed with the patient in reverse Trendelenburg.  +---------+---------------+---------+-----------+----------+--------------+ RIGHT    CompressibilityPhasicitySpontaneityPropertiesThrombus Aging +---------+---------------+---------+-----------+----------+--------------+ CFV      Full           Yes      Yes                                 +---------+---------------+---------+-----------+----------+--------------+ SFJ      Full                                                        +---------+---------------+---------+-----------+----------+--------------+ FV Prox  Full                                                        +---------+---------------+---------+-----------+----------+--------------+  FV Mid   Full           Yes      Yes                                 +---------+---------------+---------+-----------+----------+--------------+ FV DistalFull                                                         +---------+---------------+---------+-----------+----------+--------------+ PFV      Full                                                        +---------+---------------+---------+-----------+----------+--------------+ POP      Full           Yes      Yes                                 +---------+---------------+---------+-----------+----------+--------------+ PTV      Full                                                        +---------+---------------+---------+-----------+----------+--------------+ PERO     Full                                                        +---------+---------------+---------+-----------+----------+--------------+   +---------+---------------+---------+-----------+----------+--------------+ LEFT     CompressibilityPhasicitySpontaneityPropertiesThrombus Aging +---------+---------------+---------+-----------+----------+--------------+ CFV      Full           Yes      Yes                                 +---------+---------------+---------+-----------+----------+--------------+ SFJ      Full                                                        +---------+---------------+---------+-----------+----------+--------------+ FV Prox  Full                                                        +---------+---------------+---------+-----------+----------+--------------+ FV Mid   Full           Yes      Yes                                 +---------+---------------+---------+-----------+----------+--------------+  FV DistalFull                                                        +---------+---------------+---------+-----------+----------+--------------+ PFV      Full                                                        +---------+---------------+---------+-----------+----------+--------------+ POP      Full           Yes      Yes                                  +---------+---------------+---------+-----------+----------+--------------+ PTV      Full                                                        +---------+---------------+---------+-----------+----------+--------------+ PERO     Full                                                        +---------+---------------+---------+-----------+----------+--------------+     Summary: BILATERAL: - No evidence of deep vein thrombosis seen in the lower extremities, bilaterally. -No evidence of popliteal cyst, bilaterally.   *See table(s) above for measurements and observations. Electronically signed by Debby Robertson on 09/20/2024 at 4:08:39 PM.    Final    ECHOCARDIOGRAM COMPLETE Result Date: 09/20/2024    ECHOCARDIOGRAM REPORT   Patient Name:   [name] [name] Date of Exam: 09/20/2024 Medical Rec #:  [id number]       Height:       68.0 in Accession #:    [id number]      Weight:       202.6 lb Date of Birth:  October 28, 1966      BSA:          2.055 m Patient Age:    57 years        BP:           115/61 mmHg Patient Gender: F               HR:           89 bpm. Exam Location:  Inpatient Procedure: 2D Echo and Intracardiac Opacification Agent (Both Spectral and Color            Flow Doppler were utilized during procedure). Indications:    CHF  History:        Patient has prior history of Echocardiogram examinations. CHF                 and Cardiomyopathy.  Sonographer:    Norleen Amour Referring Phys: [id number] SUBRINA SUNDIL IMPRESSIONS  1. Left ventricular ejection fraction, by estimation, is 25 to 30%. The left ventricle has severely decreased  function. The left ventricle demonstrates regional wall motion abnormalities (see scoring diagram/findings for description). The left ventricular internal cavity size was mildly to moderately dilated. Left ventricular diastolic parameters are consistent with Grade I diastolic dysfunction (impaired relaxation).  2. Right ventricular systolic function is normal. The right  ventricular size is normal. Tricuspid regurgitation signal is inadequate for assessing PA pressure.  3. Left atrial size was mildly dilated.  4. Right atrial size was mildly dilated.  5. Moderate pericardial effusion. The pericardial effusion is circumferential. There is no evidence of cardiac tamponade.  6. The mitral valve is abnormal. Mild mitral valve regurgitation.  7. The aortic valve is abnormal. Aortic valve regurgitation is not visualized. Aortic valve sclerosis/calcification is present, without any evidence of aortic stenosis.  8. The inferior vena cava is normal in size with greater than 50% respiratory variability, suggesting right atrial pressure of 3 mmHg. Comparison(s): A prior study was performed on 05/15/2024. Prior images reviewed side by side. Left ventricular ejection fraction was 40-45% with akinesis of the distal anteroseptal wall and apex which appear more pronounced on today's study. There was no  pericardial effusion or mitral regurgitation noted at that time. Conclusion(s)/Recommendation(s): Critical findings reported to Georganna Archer. FINDINGS  Left Ventricle: Akinesis of the apex and periapical segments with severe hypokinesis of remaining segments. No thrombus noted. Left ventricular ejection fraction, by estimation, is 25 to 30%. The left ventricle has severely decreased function. The left ventricle demonstrates regional wall motion abnormalities. Definity  contrast agent was given IV to delineate the left ventricular endocardial borders. The left ventricular internal cavity size was mildly to moderately dilated. There is no left ventricular hypertrophy. Left ventricular diastolic parameters are consistent with Grade I diastolic dysfunction (impaired relaxation). Right Ventricle: The right ventricular size is normal. Right vetricular wall thickness was not assessed. Right ventricular systolic function is normal. Tricuspid regurgitation signal is inadequate for assessing PA pressure.  Left Atrium: Left atrial size was mildly dilated. Right Atrium: Right atrial size was mildly dilated. Pericardium: A moderately sized pericardial effusion is present. The pericardial effusion is circumferential. The pericardial effusion appears to contain coagulum posteriorly. There is no evidence of cardiac tamponade. Mitral Valve: The mitral valve is abnormal. There is moderate thickening of the mitral valve leaflet(s). Mild mitral valve regurgitation. Tricuspid Valve: The tricuspid valve is normal in structure. Tricuspid valve regurgitation is mild. Aortic Valve: The aortic valve is abnormal. Aortic valve regurgitation is not visualized. Aortic valve sclerosis/calcification is present, without any evidence of aortic stenosis. Aortic valve mean gradient measures 7.8 mmHg. Aortic valve peak gradient measures 15.4 mmHg. Aortic valve area, by VTI measures 1.27 cm. Pulmonic Valve: The pulmonic valve was normal in structure. Pulmonic valve regurgitation is trivial. Aorta: The aortic root and ascending aorta are structurally normal, with no evidence of dilitation. Venous: The inferior vena cava is normal in size with greater than 50% respiratory variability, suggesting right atrial pressure of 3 mmHg. IAS/Shunts: The interatrial septum was not well visualized. Additional Comments: There is a small pleural effusion.  LEFT VENTRICLE PLAX 2D LVIDd:         6.50 cm      Diastology LVIDs:         4.40 cm      LV e' medial:    7.62 cm/s LV PW:         0.90 cm      LV E/e' medial:  17.1 LV IVS:        0.90 cm  LV e' lateral:   8.27 cm/s LVOT diam:     2.00 cm      LV E/e' lateral: 15.7 LV SV:         46 LV SV Index:   22 LVOT Area:     3.14 cm  LV Volumes (MOD) LV vol d, MOD A2C: 210.0 ml LV vol d, MOD A4C: 186.0 ml LV vol s, MOD A2C: 148.0 ml LV vol s, MOD A4C: 137.0 ml LV SV MOD A2C:     62.0 ml LV SV MOD A4C:     186.0 ml LV SV MOD BP:      55.6 ml RIGHT VENTRICLE             IVC RV Basal diam:  3.00 cm     IVC diam:  1.80 cm RV S prime:     12.80 cm/s TAPSE (M-mode): 2.0 cm LEFT ATRIUM             Index        RIGHT ATRIUM           Index LA diam:        3.90 cm 1.90 cm/m   RA Area:     16.30 cm LA Vol (A2C):   57.6 ml 28.03 ml/m  RA Volume:   45.20 ml  22.00 ml/m LA Vol (A4C):   53.4 ml 25.99 ml/m LA Biplane Vol: 55.8 ml 27.15 ml/m  AORTIC VALVE                     PULMONIC VALVE AV Area (Vmax):    1.39 cm      PV Vmax:       0.91 m/s AV Area (Vmean):   1.33 cm      PV Peak grad:  3.3 mmHg AV Area (VTI):     1.27 cm AV Vmax:           196.25 cm/s AV Vmean:          126.250 cm/s AV VTI:            0.363 m AV Peak Grad:      15.4 mmHg AV Mean Grad:      7.8 mmHg LVOT Vmax:         87.00 cm/s LVOT Vmean:        53.500 cm/s LVOT VTI:          0.147 m LVOT/AV VTI ratio: 0.40  AORTA Ao Root diam: 2.40 cm Ao Asc diam:  3.10 cm MITRAL VALVE MV Area (PHT): 4.57 cm     SHUNTS MV Decel Time: 166 msec     Systemic VTI:  0.15 m MV E velocity: 130.00 cm/s  Systemic Diam: 2.00 cm MV A velocity: 105.00 cm/s MV E/A ratio:  1.24 Emeline Calender Electronically signed by Emeline Calender Signature Date/Time: 09/20/2024/2:01:37 PM    Final    CT Angio Chest PE W and/or Wo Contrast Result Date: 09/19/2024 CLINICAL DATA:  Positive D-dimer. Chest pain. Concern for pulmonary disease. EXAM: CT ANGIOGRAPHY CHEST WITH CONTRAST TECHNIQUE: Multidetector CT imaging of the chest was performed using the standard protocol during bolus administration of intravenous contrast. Multiplanar CT image reconstructions and MIPs were obtained to evaluate the vascular anatomy. RADIATION DOSE REDUCTION: This exam was performed according to the departmental dose-optimization program which includes automated exposure control, adjustment of the mA and/or kV according to patient size and/or use of iterative reconstruction technique. CONTRAST:  75mL OMNIPAQUE   IOHEXOL  350 MG/ML SOLN COMPARISON:  Chest radiograph dated 09/19/2024. FINDINGS: Cardiovascular: There is no  cardiomegaly. Small pericardial effusion measures approximately 7 mm in thickness. Three vessel coronary vascular calcification. Mild atherosclerotic calcification of the thoracic aorta. No aneurysmal dilatation. No pulmonary artery embolus identified. Mediastinum/Nodes: No hilar or mediastinal adenopathy. The esophagus and the thyroid  gland are grossly unremarkable. No mediastinal fluid collection. Lungs/Pleura: Small bilateral pleural effusions. There is partial compressive atelectasis of the lower lobes versus pneumonia. Patchy areas of airspace density in the upper lobes may represent edema, pneumonia, or combination. No pneumothorax. The central airways are patent. Upper Abdomen: No acute abnormality. Musculoskeletal: No acute osseous pathology. Mild degenerative changes. Review of the MIP images confirms the above findings. IMPRESSION: 1. No CT evidence of pulmonary artery embolus. 2. Small bilateral pleural effusions with partial compressive atelectasis of the lower lobes versus pneumonia. 3. Patchy areas of airspace density in the upper lobes may represent edema, pneumonia, or combination. 4.  Aortic Atherosclerosis (ICD10-I70.0). Electronically Signed   By: Vanetta Chou M.D.   On: 09/19/2024 21:11   DG Chest 2 View Result Date: 09/19/2024 CLINICAL DATA:  Chest pain. EXAM: CHEST - 2 VIEW COMPARISON:  Chest radiograph dated 09/14/2024. FINDINGS: There is mild cardiomegaly with vascular congestion and mild edema. Small bilateral pleural effusions and bibasilar atelectasis or infiltrate. No pneumothorax. No acute osseous pathology. IMPRESSION: Mild cardiomegaly with findings of CHF and small bilateral pleural effusions. Electronically Signed   By: Vanetta Chou M.D.   On: 09/19/2024 17:23    Cardiac Studies TTE 9/30: Independently reviewed by me demonstrating an EF between 20 to 25% with preserved RV function.  Pericardial fusion without evidence of tamponade.  Patient Profile   Mrs. Rottinghaus is  a 58 y.o. female with a PMH of coronary/aortic calcifications, HFmrEF, AT s/p ablation, HLD, DM2 who presented with the CC of SOB/swelling.  Cardiology is consulted for the evaluation and management of decompensated CHF.   Assessment & Plan   #New HFrEF #Cardiac Chest Pain :: Echocardiogram shows new systolic heart failure.  My greatest suspicion is that this is ischemic in etiology given her recently abnormal coronary CTA and intermittent episodes of chest pain.  The plan is to do both an LHC/RHC today to assess her coronary anatomy and right heart pressures.  She is currently on GDMT for CHF, and I will touch base with her pharmacist to see which SGLT2 inhibitor is affordable for her.  Will hold off on diuresis until the RHC results. - LHC/RHC today -Continue GDMT:  -Metoprolol  succinate 25 mg twice daily  -spironolactone  25 mg daily  -losartan  25 mg daily -Will ask pharmacy about price of SGLT2's -Diuresis pending cath results - Scheduled Tylenol  650 mg q8h - Lidocaine patches to rib cage - Follow-up second troponin from this morning    #HLD #Hypertriglyceridemia :: Triglycerides are still elevated today despite being fasting.  Her LDL is at goal, but she is on a low-dose of statin.  I will increase her dose of statin to maximal dose in order to drive her triglycerides down to a goal of <150. - Increase atorvastatin  to 80 mg daily -Repeat fasting triglycerides in 3 months   #DM2 - Insulin  per primary team        For questions or updates, please contact [facility] Please consult [email or web address] for contact info under       Signed, Georganna Archer, MD  09/21/2024, 10:09 AM

## 2024-09-21 NOTE — Assessment & Plan Note (Signed)
Continue metoprolol for rate control. °

## 2024-09-21 NOTE — TOC CM/SW Note (Signed)
 Transition of Care [facility]) - Inpatient Brief Assessment   Patient Details  Name: [name] [name] MRN: [id number] Date of Birth: [birth year]  [facility] CM/SW Contact:    Waddell Barnie Rama, RN Phone Number: 09/21/2024, 11:11 AM   Clinical Narrative: From home with spouse, has PCP and insurance on file, states has no HH services in place at this time or DME at home.  States family member will transport them home at [employer] and family is support system, states gets medications from [facility] on [address]  Pta self ambulatory.   There are no ICM needs identified  at this time.  Please place consult for any ICM  needs.     Transition of Care Asessment: Insurance and Status: Insurance coverage has been reviewed Patient has primary care physician: Yes Home environment has been reviewed: home with spouse and son Prior level of function:: indep Prior/Current Home Services: No current home services Social Drivers of Health Review: SDOH reviewed no interventions necessary Readmission risk has been reviewed: Yes Transition of care needs: no transition of care needs at this time

## 2024-09-21 NOTE — Assessment & Plan Note (Signed)
 Patient with multivessel coronary artery disease.  LAD, D1 and PDA and PL branches good targets for CABG.   Continue medical therapy with aspirin  and statin.  As needed sl nitroglycerin .  Plan to follow up as outpatient with cardiovascular surgery, for possible CABG.

## 2024-09-21 NOTE — Consult Note (Addendum)
 [address] [place] [address]             [phone]    [name] [name] [facility] Medical Record #[id number] Date of Birth: May 01, 1966  Referring: No ref. provider found Primary Care: [name], [name] GAILS, MD Primary Cardiologist:None  Chief Complaint:    Chief Complaint  Patient presents with   Chest Pain   Shortness of Breath    History of Present Illness:    We are asked to see this 58 year old female in cardiothoracic surgical consultation for consideration of CABG.  She presented to the emergency department 2 days ago with shortness of breath, chest pain and right-sided pleuritic rib pain approximately 1 week.  She presented to [facility] about a week ago with rib pain after her husband perform the Heimlich maneuver on her.  The chest pain and shortness of breath have progressed over the week prompting her ER presentation.  Chest pain was described as similar to an elephant sitting on her chest.  She also notes that she developed bilateral lower extremity edema.  She does have cardiac risk factors and comorbidities including a history of SVT status post ablation, HFrEF with EF of 40 to 45%, remote history of peripartum cardiomyopathy with resolution, remote history of DVT no longer on anticoagulation, hyperlipidemia, and DM2.  She did initially see her PCP when symptoms began and was started on torsemide as her BNP was noted to be elevated.  She is noted to have minor elevation of her high-sensitivity troponin I peaking thus far at 31.  EKG in the emergency department revealed a prolonged QTc of 506.  There was sinus tachycardia but no specific ischemic findings.  Chest x-ray revealed mild cardiomegaly and findings of CHF as well as small pleural effusions.  And cardiology consultation was obtained.  A CT of the chest revealed no evidence of pulmonary embolism.  Vascular studies showed no evidence of DVT.  There was findings with some concern for  atelectasis versus pneumonia.  She is noted to have had a CT coronary study in July 2025 showing severe CAD with an elevated coronary artery calcification score of 2781 which places her in the 99th percentile for age/race and sex matched controls.  Cardiac catheterization done today reveals severe three-vessel coronary artery disease and significantly elevated LVEDP of 33 mmHg.  An echocardiogram was performed on 09/20/2024 and LVEF is noted to be significantly reduced at 25 to 30%.  There is severe left ventricular dysfunction with left ventricular regional wall motion abnormalities.  Right ventricular function is described as normal.  There is a moderate pericardial effusion but no evidence of tamponade.  There is mild mitral valve regurgitation.  Aortic valve regurgitation and aortic stenosis are not noted.  Please see the full report described below.    Current Activity/ Functional Status: Patient was independent with mobility/ambulation, transfers, ADL's, IADL's.   Zubrod Score: At the time of surgery this patient's most appropriate activity status/level should be described as: []     0    Normal activity, no symptoms []     1    Restricted in physical strenuous activity but ambulatory, able to do out light work []     2    Ambulatory and capable of self care, unable to do work activities, up and about                 more than 50%  Of the  time                            []     3    Only limited self care, in bed greater than 50% of waking hours []     4    Completely disabled, no self care, confined to bed or chair []     5    Moribund  Past Medical History:  Diagnosis Date   Allergy    Anemia    Anxiety    Cataract    CHF (congestive heart failure) (HCC) 2005   peripartum cardiomyopathy   Depression    Diabetes mellitus    type II   GERD (gastroesophageal reflux disease)    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   Hydradenitis    right   Neuromuscular disorder (HCC)     Past Surgical History:   Procedure Laterality Date   CESAREAN SECTION     x 2   CHOLECYSTECTOMY  2006   SVT ABLATION N/A 07/20/2024   Procedure: SVT ABLATION;  Surgeon: Waddell Danelle ORN, MD;  Location: [facility] INVASIVE CV LAB;  Service: Cardiovascular;  Laterality: N/A;    Social History   Tobacco Use  Smoking Status Former   Current packs/day: 0.50   Average packs/day: 0.5 packs/day for 15.0 years (7.5 ttl pk-yrs)   Types: Cigarettes  Smokeless Tobacco Never  Tobacco Comments   info given 12/14/2019    Social History   Substance and Sexual Activity  Alcohol Use Not Currently     Allergies  Allergen Reactions   Adenosine      SOB and pressure for 20 seconds    Current Facility-Administered Medications  Medication Dose Route Frequency Provider Last Rate Last Admin   acetaminophen  (TYLENOL ) tablet 650 mg  650 mg Oral Q8H Floretta Mallard, MD   650 mg at 09/21/24 9370   albuterol  (PROVENTIL ) (2.5 MG/3ML) 0.083% nebulizer solution 2.5 mg  2.5 mg Nebulization Q6H PRN Sundil, Subrina, MD       And   budesonide (PULMICORT) nebulizer solution 0.25 mg  0.25 mg Nebulization Q6H PRN Sundil, Subrina, MD       aspirin  chewable tablet 81 mg  81 mg Oral Daily Sundil, Subrina, MD   81 mg at 09/21/24 9193   atorvastatin  (LIPITOR) tablet 80 mg  80 mg Oral Daily Floretta Mallard, MD   80 mg at 09/21/24 9193   buPROPion  (WELLBUTRIN  SR) 12 hr tablet 150 mg  150 mg Oral BID WC Sundil, Subrina, MD   150 mg at 09/21/24 9193   citalopram  (CELEXA ) tablet 40 mg  40 mg Oral Daily Sundil, Subrina, MD   40 mg at 09/21/24 9193   dapagliflozin propanediol (FARXIGA) tablet 10 mg  10 mg Oral Daily Ganji, Jay, MD       enoxaparin  (LOVENOX ) injection 40 mg  40 mg Subcutaneous Q24H Sundil, Subrina, MD   40 mg at 09/20/24 2133   furosemide (LASIX) injection 40 mg  40 mg Intravenous BID Sundil, Subrina, MD   40 mg at 09/21/24 9192   insulin  aspart (novoLOG ) injection 0-5 Units  0-5 Units Subcutaneous QHS Sundil, Subrina, MD       insulin   aspart (novoLOG ) injection 0-6 Units  0-6 Units Subcutaneous TID WC Sundil, Subrina, MD       lidocaine (LIDODERM) 5 % 2 patch  2 patch Transdermal Q24H Floretta Mallard, MD   2 patch at 09/20/24 0914   losartan  (COZAAR ) tablet  25 mg  25 mg Oral Daily Sundil, Subrina, MD   25 mg at 09/21/24 0806   metoprolol  tartrate (LOPRESSOR ) tablet 25 mg  25 mg Oral BID Floretta Mallard, MD   25 mg at 09/21/24 0806   nitroGLYCERIN  (NITROSTAT ) 0.4 MG SL tablet            nitroGLYCERIN  (NITROSTAT ) SL tablet 0.4 mg  0.4 mg Sublingual Q5 min PRN Orlando Jerrell LABOR, MD   0.4 mg at 09/21/24 9562   oxyCODONE -acetaminophen  (PERCOCET/ROXICET) 5-325 MG per tablet 1 tablet  1 tablet Oral Q6H PRN Sundil, Subrina, MD   1 tablet at 09/21/24 1229   pantoprazole  (PROTONIX ) EC tablet 40 mg  40 mg Oral Daily Sundil, Subrina, MD   40 mg at 09/21/24 9193   sodium chloride  flush (NS) 0.9 % injection 3 mL  3 mL Intravenous Q12H Sundil, Subrina, MD   3 mL at 09/21/24 9192   sodium chloride  flush (NS) 0.9 % injection 3 mL  3 mL Intravenous Q12H Sundil, Subrina, MD   3 mL at 09/21/24 9192   sodium chloride  flush (NS) 0.9 % injection 3 mL  3 mL Intravenous PRN Sundil, Subrina, MD       spironolactone  (ALDACTONE ) tablet 25 mg  25 mg Oral Daily Sundil, Subrina, MD   25 mg at 09/21/24 9193   trimethobenzamide (TIGAN) injection 200 mg  200 mg Intramuscular Q6H PRN Sundil, Subrina, MD        Medications Prior to Admission  Medication Sig Dispense Refill Last Dose/Taking   Albuterol -Budesonide (AIRSUPRA ) 90-80 MCG/ACT AERO Inhale 2 Inhalations into the lungs 4 (four) times daily as needed. 10 g 5 09/19/2024 Morning   ALPRAZolam  (XANAX ) 0.5 MG tablet TAKE 1 TABLET(0.5 MG) BY MOUTH TWICE DAILY (Patient taking differently: Take 0.5 mg by mouth daily as needed for anxiety.) 180 tablet 0 Past Week   aspirin  81 MG chewable tablet Chew 1 tablet (81 mg total) by mouth daily. 90 tablet 1 09/19/2024 Morning   atorvastatin  (LIPITOR) 10 MG tablet TAKE 1  TABLET BY MOUTH DAILY AT 6 PM 90 tablet 3 09/18/2024 Evening   buPROPion  (WELLBUTRIN  SR) 150 MG 12 hr tablet TAKE 1 TABLET BY MOUTH TWICE DAILY 180 tablet 1 09/19/2024 Morning   Cholecalciferol  (EQL VITAMIN D3) 1000 units tablet Take 1 tablet (1,000 Units total) by mouth daily. 90 tablet 1 09/19/2024 Morning   citalopram  (CELEXA ) 40 MG tablet Take 1 tablet (40 mg total) by mouth daily. 90 tablet 3 09/18/2024 Evening   diltiazem  (CARDIZEM ) 30 MG tablet Take 1 tablet (30 mg total) by mouth 3 (three) times daily as needed. 90 tablet 0 Past Month   gabapentin  (NEURONTIN ) 100 MG capsule TAKE 1 CAPSULE(100 MG) BY MOUTH THREE TIMES DAILY 90 capsule 5 09/18/2024 Evening   GLYXAMBI  25-5 MG TABS TAKE 1 TABLET BY MOUTH DAILY 90 tablet 3 09/19/2024 Morning   losartan  (COZAAR ) 25 MG tablet TAKE 1 TABLET BY MOUTH EVERY DAY 90 tablet 3 09/19/2024 Morning   metFORMIN  (GLUCOPHAGE ) 500 MG tablet TAKE 2 TABLETS TWICE DAILY WITH FOOD 360 tablet 0 09/19/2024 Morning   metoprolol  succinate (TOPROL -XL) 25 MG 24 hr tablet TAKE 2 TABLETS(50 MG) BY MOUTH DAILY (Patient taking differently: Take 25 mg by mouth in the morning and at bedtime.) 180 tablet 3 09/19/2024 Morning   oxyCODONE -acetaminophen  (PERCOCET/ROXICET) 5-325 MG tablet Take 1 tablet by mouth every 6 (six) hours as needed for severe pain (pain score 7-10). 15 tablet 0 09/19/2024 at  1:00 PM  pantoprazole  (PROTONIX ) 40 MG tablet TAKE 1 TABLET(40 MG) BY MOUTH TWICE DAILY 180 tablet 3 09/19/2024 Morning   repaglinide  (PRANDIN ) 2 MG tablet TAKE 1 TABLET BY MOUTH THREE TIMES DAILY BEFORE MEALS 270 tablet 3 09/19/2024 Morning   Semaglutide , 2 MG/DOSE, 8 MG/3ML SOPN 2 mg sq weekly (Patient taking differently: Inject 1 mg into the skin every Sunday.) 3 mL 5 09/18/2024 Morning   spironolactone  (ALDACTONE ) 25 MG tablet Take 1 tablet (25 mg total) by mouth daily. 90 tablet 3 09/19/2024 Morning    Family History  Problem Relation Age of Onset   Diabetes Mother    Hypertension Mother     Inflammatory bowel disease Maternal Aunt    Colon cancer Paternal Aunt    Colon polyps Maternal Grandmother    Hypertension Other    Liver cancer Neg Hx    Pancreatic cancer Neg Hx    Rectal cancer Neg Hx    Stomach cancer Neg Hx    Esophageal cancer Neg Hx      Review of Systems:   Review of Systems  Constitutional:  Positive for malaise/fatigue.  Respiratory:  Positive for shortness of breath.   Cardiovascular:  Positive for chest pain and leg swelling.  Musculoskeletal:  Positive for myalgias.  Psychiatric/Behavioral:  The patient is nervous/anxious.         Physical Exam: BP 137/75   Pulse 99   Temp 98 F (36.7 C) (Oral)   Resp 16   Ht 5' 8 (1.727 m)   Wt 89.3 kg   LMP 03/02/2017   SpO2 96%   BMI 29.93 kg/m    General appearance: alert, cooperative, and no distress Head: Normocephalic, without obvious abnormality, atraumatic Neck: no adenopathy, no carotid bruit, no JVD, supple, symmetrical, trachea midline, and thyroid  not enlarged, symmetric, no tenderness/mass/nodules Lymph nodes: Cervical, supraclavicular, and axillary nodes normal. Resp: clear to auscultation bilaterally Back: symmetric, no curvature. ROM normal. No CVA tenderness. Cardio: Regular rate and rhythm, soft systolic murmur GI: Moderately overweight, positive bowel sounds, soft, nontender nondistended Extremities: No clubbing, cyanosis or edema, peripheral pulses intact Neurologic: Grossly normal  Diagnostic Studies & Laboratory data:     Recent Radiology Findings:   CARDIAC CATHETERIZATION Addendum Date: 09/21/2024 Cardiac Catheterization 09/21/24: Hemodynamic data: Moderate pulmonary hypertension with markedly elevated LVEDP consistent with WHO group 2 PAH.  RV function is preserved with PAPi 2.3. COP 6.12 andn CI 3.02 by Fick LVEDP 33 mmHg.  There is no pressure gradient across the aortic valve. Angiographic data: LM: Calcified but widely patent. LAD: Calcified in the proximal segment.   There is moderate diffuse disease in the proximal segment.  There is a proximal 60% stenosis followed by 80% stenosis distal to the origin of D1 with FFR 0.77, indicating hemodynamically significant stenosis.  Large D1 with proximal 80% stenosis with FFR 0.67, hemodynamically significant.  Distal to apical LAD has 90% focal stenosis. LCx: Gives origin to large OM 2 and OM 3, mild diffuse disease. RCA: Large-caliber vessel, gives origin to large PDA and large PL branch.  Proximal segment has a 99% calcific stenosis. Impression and recommendations: Patient's LAD and D1 and PDA and PL branches are good targets for CABG.  Once patient is optimized medically from heart failure, would benefit from consideration for surgical revascularization.  Patient was started on Farxiga today. Suspect can be discharged once stable with elective CABG.   Result Date: 09/21/2024 Images from the original result were not included. Cardiac Catheterization 09/21/24: Hemodynamic data: Moderate pulmonary hypertension  with markedly elevated LVEDP consistent with WHO group 2 PAH.  RV function is preserved with PAPi 2.3. LVEDP 33 mmHg.  There is no pressure gradient across the aortic valve. Angiographic data: LM: Calcified but widely patent. LAD: Calcified in the proximal segment.  There is moderate diffuse disease in the proximal segment.  There is a proximal 60% stenosis followed by 80% stenosis distal to the origin of D1 with FFR 0.77, indicating hemodynamically significant stenosis.  Large D1 with proximal 80% stenosis with FFR 0.67, hemodynamically significant.  Distal to apical LAD has 90% focal stenosis. LCx: Gives origin to large OM 2 and OM 3, mild diffuse disease. RCA: Large-caliber vessel, gives origin to large PDA and large PL branch.  Proximal segment has a 99% calcific stenosis. Impression and recommendations: Patient's LAD and D1 and PDA and PL branches are good targets for CABG.  Once patient is optimized medically from heart  failure, would benefit from consideration for surgical revascularization.  Patient was started on Farxiga today.   VAS US  LOWER EXTREMITY VENOUS (DVT) Result Date: 09/20/2024  Lower Venous DVT Study Patient Name:  [name] [name]  Date of Exam:   09/20/2024 Medical Rec #: [id number]        Accession #:    [id number] Date of Birth: [birth year]       Patient Gender: F Patient Age:   33 years Exam Location:  [facility] Procedure:      VAS US  LOWER EXTREMITY VENOUS (DVT) Referring Phys: MICAELA SPEAKER --------------------------------------------------------------------------------  Indications: Swelling. Other Indications: SOB, BLE swelling, chest pain and right rib pain. Pt states                    right rib pain x 1 week. Comparison Study: No recent studies Performing Technologist: Ricka Sturdivant-Jones RDMS, RVT  Examination Guidelines: A complete evaluation includes B-mode imaging, spectral Doppler, color Doppler, and power Doppler as needed of all accessible portions of each vessel. Bilateral testing is considered an integral part of a complete examination. Limited examinations for reoccurring indications may be performed as noted. The reflux portion of the exam is performed with the patient in reverse Trendelenburg.  +---------+---------------+---------+-----------+----------+--------------+ RIGHT    CompressibilityPhasicitySpontaneityPropertiesThrombus Aging +---------+---------------+---------+-----------+----------+--------------+ CFV      Full           Yes      Yes                                 +---------+---------------+---------+-----------+----------+--------------+ SFJ      Full                                                        +---------+---------------+---------+-----------+----------+--------------+ FV Prox  Full                                                        +---------+---------------+---------+-----------+----------+--------------+ FV Mid   Full            Yes      Yes                                 +---------+---------------+---------+-----------+----------+--------------+  FV DistalFull                                                        +---------+---------------+---------+-----------+----------+--------------+ PFV      Full                                                        +---------+---------------+---------+-----------+----------+--------------+ POP      Full           Yes      Yes                                 +---------+---------------+---------+-----------+----------+--------------+ PTV      Full                                                        +---------+---------------+---------+-----------+----------+--------------+ PERO     Full                                                        +---------+---------------+---------+-----------+----------+--------------+   +---------+---------------+---------+-----------+----------+--------------+ LEFT     CompressibilityPhasicitySpontaneityPropertiesThrombus Aging +---------+---------------+---------+-----------+----------+--------------+ CFV      Full           Yes      Yes                                 +---------+---------------+---------+-----------+----------+--------------+ SFJ      Full                                                        +---------+---------------+---------+-----------+----------+--------------+ FV Prox  Full                                                        +---------+---------------+---------+-----------+----------+--------------+ FV Mid   Full           Yes      Yes                                 +---------+---------------+---------+-----------+----------+--------------+ FV DistalFull                                                        +---------+---------------+---------+-----------+----------+--------------+  PFV      Full                                                         +---------+---------------+---------+-----------+----------+--------------+ POP      Full           Yes      Yes                                 +---------+---------------+---------+-----------+----------+--------------+ PTV      Full                                                        +---------+---------------+---------+-----------+----------+--------------+ PERO     Full                                                        +---------+---------------+---------+-----------+----------+--------------+     Summary: BILATERAL: - No evidence of deep vein thrombosis seen in the lower extremities, bilaterally. -No evidence of popliteal cyst, bilaterally.   *See table(s) above for measurements and observations. Electronically signed by Debby Robertson on 09/20/2024 at 4:08:39 PM.    Final    ECHOCARDIOGRAM COMPLETE Result Date: 09/20/2024    ECHOCARDIOGRAM REPORT   Patient Name:   [name] [name] Date of Exam: 09/20/2024 Medical Rec #:  [id number]       Height:       68.0 in Accession #:    [id number]      Weight:       202.6 lb Date of Birth:  Mar 30, 1966      BSA:          2.055 m Patient Age:    57 years        BP:           115/61 mmHg Patient Gender: F               HR:           89 bpm. Exam Location:  Inpatient Procedure: 2D Echo and Intracardiac Opacification Agent (Both Spectral and Color            Flow Doppler were utilized during procedure). Indications:    CHF  History:        Patient has prior history of Echocardiogram examinations. CHF                 and Cardiomyopathy.  Sonographer:    Norleen Amour Referring Phys: [id number] SUBRINA SUNDIL IMPRESSIONS  1. Left ventricular ejection fraction, by estimation, is 25 to 30%. The left ventricle has severely decreased function. The left ventricle demonstrates regional wall motion abnormalities (see scoring diagram/findings for description). The left ventricular internal cavity size was mildly to moderately dilated. Left ventricular  diastolic parameters are consistent with Grade I diastolic dysfunction (impaired relaxation).  2. Right ventricular systolic function is normal. The right ventricular size is normal. Tricuspid regurgitation signal is inadequate  for assessing PA pressure.  3. Left atrial size was mildly dilated.  4. Right atrial size was mildly dilated.  5. Moderate pericardial effusion. The pericardial effusion is circumferential. There is no evidence of cardiac tamponade.  6. The mitral valve is abnormal. Mild mitral valve regurgitation.  7. The aortic valve is abnormal. Aortic valve regurgitation is not visualized. Aortic valve sclerosis/calcification is present, without any evidence of aortic stenosis.  8. The inferior vena cava is normal in size with greater than 50% respiratory variability, suggesting right atrial pressure of 3 mmHg. Comparison(s): A prior study was performed on 05/15/2024. Prior images reviewed side by side. Left ventricular ejection fraction was 40-45% with akinesis of the distal anteroseptal wall and apex which appear more pronounced on today's study. There was no  pericardial effusion or mitral regurgitation noted at that time. Conclusion(s)/Recommendation(s): Critical findings reported to Georganna Archer. FINDINGS  Left Ventricle: Akinesis of the apex and periapical segments with severe hypokinesis of remaining segments. No thrombus noted. Left ventricular ejection fraction, by estimation, is 25 to 30%. The left ventricle has severely decreased function. The left ventricle demonstrates regional wall motion abnormalities. Definity  contrast agent was given IV to delineate the left ventricular endocardial borders. The left ventricular internal cavity size was mildly to moderately dilated. There is no left ventricular hypertrophy. Left ventricular diastolic parameters are consistent with Grade I diastolic dysfunction (impaired relaxation). Right Ventricle: The right ventricular size is normal. Right vetricular  wall thickness was not assessed. Right ventricular systolic function is normal. Tricuspid regurgitation signal is inadequate for assessing PA pressure. Left Atrium: Left atrial size was mildly dilated. Right Atrium: Right atrial size was mildly dilated. Pericardium: A moderately sized pericardial effusion is present. The pericardial effusion is circumferential. The pericardial effusion appears to contain coagulum posteriorly. There is no evidence of cardiac tamponade. Mitral Valve: The mitral valve is abnormal. There is moderate thickening of the mitral valve leaflet(s). Mild mitral valve regurgitation. Tricuspid Valve: The tricuspid valve is normal in structure. Tricuspid valve regurgitation is mild. Aortic Valve: The aortic valve is abnormal. Aortic valve regurgitation is not visualized. Aortic valve sclerosis/calcification is present, without any evidence of aortic stenosis. Aortic valve mean gradient measures 7.8 mmHg. Aortic valve peak gradient measures 15.4 mmHg. Aortic valve area, by VTI measures 1.27 cm. Pulmonic Valve: The pulmonic valve was normal in structure. Pulmonic valve regurgitation is trivial. Aorta: The aortic root and ascending aorta are structurally normal, with no evidence of dilitation. Venous: The inferior vena cava is normal in size with greater than 50% respiratory variability, suggesting right atrial pressure of 3 mmHg. IAS/Shunts: The interatrial septum was not well visualized. Additional Comments: There is a small pleural effusion.  LEFT VENTRICLE PLAX 2D LVIDd:         6.50 cm      Diastology LVIDs:         4.40 cm      LV e' medial:    7.62 cm/s LV PW:         0.90 cm      LV E/e' medial:  17.1 LV IVS:        0.90 cm      LV e' lateral:   8.27 cm/s LVOT diam:     2.00 cm      LV E/e' lateral: 15.7 LV SV:         46 LV SV Index:   22 LVOT Area:     3.14 cm  LV Volumes (MOD) LV vol  d, MOD A2C: 210.0 ml LV vol d, MOD A4C: 186.0 ml LV vol s, MOD A2C: 148.0 ml LV vol s, MOD A4C: 137.0 ml  LV SV MOD A2C:     62.0 ml LV SV MOD A4C:     186.0 ml LV SV MOD BP:      55.6 ml RIGHT VENTRICLE             IVC RV Basal diam:  3.00 cm     IVC diam: 1.80 cm RV S prime:     12.80 cm/s TAPSE (M-mode): 2.0 cm LEFT ATRIUM             Index        RIGHT ATRIUM           Index LA diam:        3.90 cm 1.90 cm/m   RA Area:     16.30 cm LA Vol (A2C):   57.6 ml 28.03 ml/m  RA Volume:   45.20 ml  22.00 ml/m LA Vol (A4C):   53.4 ml 25.99 ml/m LA Biplane Vol: 55.8 ml 27.15 ml/m  AORTIC VALVE                     PULMONIC VALVE AV Area (Vmax):    1.39 cm      PV Vmax:       0.91 m/s AV Area (Vmean):   1.33 cm      PV Peak grad:  3.3 mmHg AV Area (VTI):     1.27 cm AV Vmax:           196.25 cm/s AV Vmean:          126.250 cm/s AV VTI:            0.363 m AV Peak Grad:      15.4 mmHg AV Mean Grad:      7.8 mmHg LVOT Vmax:         87.00 cm/s LVOT Vmean:        53.500 cm/s LVOT VTI:          0.147 m LVOT/AV VTI ratio: 0.40  AORTA Ao Root diam: 2.40 cm Ao Asc diam:  3.10 cm MITRAL VALVE MV Area (PHT): 4.57 cm     SHUNTS MV Decel Time: 166 msec     Systemic VTI:  0.15 m MV E velocity: 130.00 cm/s  Systemic Diam: 2.00 cm MV A velocity: 105.00 cm/s MV E/A ratio:  1.24 Emeline Calender Electronically signed by Emeline Calender Signature Date/Time: 09/20/2024/2:01:37 PM    Final    CT Angio Chest PE W and/or Wo Contrast Result Date: 09/19/2024 CLINICAL DATA:  Positive D-dimer. Chest pain. Concern for pulmonary disease. EXAM: CT ANGIOGRAPHY CHEST WITH CONTRAST TECHNIQUE: Multidetector CT imaging of the chest was performed using the standard protocol during bolus administration of intravenous contrast. Multiplanar CT image reconstructions and MIPs were obtained to evaluate the vascular anatomy. RADIATION DOSE REDUCTION: This exam was performed according to the departmental dose-optimization program which includes automated exposure control, adjustment of the mA and/or kV according to patient size and/or use of iterative reconstruction  technique. CONTRAST:  75mL OMNIPAQUE  IOHEXOL  350 MG/ML SOLN COMPARISON:  Chest radiograph dated 09/19/2024. FINDINGS: Cardiovascular: There is no cardiomegaly. Small pericardial effusion measures approximately 7 mm in thickness. Three vessel coronary vascular calcification. Mild atherosclerotic calcification of the thoracic aorta. No aneurysmal dilatation. No pulmonary artery embolus identified. Mediastinum/Nodes: No hilar or mediastinal adenopathy. The esophagus and  the thyroid  gland are grossly unremarkable. No mediastinal fluid collection. Lungs/Pleura: Small bilateral pleural effusions. There is partial compressive atelectasis of the lower lobes versus pneumonia. Patchy areas of airspace density in the upper lobes may represent edema, pneumonia, or combination. No pneumothorax. The central airways are patent. Upper Abdomen: No acute abnormality. Musculoskeletal: No acute osseous pathology. Mild degenerative changes. Review of the MIP images confirms the above findings. IMPRESSION: 1. No CT evidence of pulmonary artery embolus. 2. Small bilateral pleural effusions with partial compressive atelectasis of the lower lobes versus pneumonia. 3. Patchy areas of airspace density in the upper lobes may represent edema, pneumonia, or combination. 4.  Aortic Atherosclerosis (ICD10-I70.0). Electronically Signed   By: Vanetta Chou M.D.   On: 09/19/2024 21:11   DG Chest 2 View Result Date: 09/19/2024 CLINICAL DATA:  Chest pain. EXAM: CHEST - 2 VIEW COMPARISON:  Chest radiograph dated 09/14/2024. FINDINGS: There is mild cardiomegaly with vascular congestion and mild edema. Small bilateral pleural effusions and bibasilar atelectasis or infiltrate. No pneumothorax. No acute osseous pathology. IMPRESSION: Mild cardiomegaly with findings of CHF and small bilateral pleural effusions. Electronically Signed   By: Vanetta Chou M.D.   On: 09/19/2024 17:23     I have independently reviewed the above radiologic studies and  discussed with the patient   Recent Lab Findings: Lab Results  Component Value Date   WBC 6.7 09/21/2024   HGB 12.0 09/21/2024   HCT 39.5 09/21/2024   PLT 271 09/21/2024   GLUCOSE 172 (H) 09/21/2024   CHOL 131 09/20/2024   TRIG 306 (H) 09/21/2024   HDL 31 (L) 09/20/2024   LDLDIRECT 69.0 08/11/2019   LDLCALC 51 09/20/2024   ALT 16 09/20/2024   AST 20 09/20/2024   NA 137 09/21/2024   K 3.8 09/21/2024   CL 100 09/21/2024   CREATININE 0.59 09/21/2024   BUN 12 09/21/2024   CO2 25 09/21/2024   TSH 2.02 09/08/2024   INR 1.0 09/21/2024   HGBA1C 8.3 (H) 09/08/2024      Assessment / Plan: Severe three-vessel coronary artery disease HFrEF with acute exacerbation Right-sided rib pain status post Heimlich maneuver DM2 GERD Tobacco abuse-current use infrequent Hyperlipidemia Hypertriglyceridemia  Plan: She appears to be a good candidate for CABG.  She will require some GDMT maximization prior to proceeding.    The surgeon will evaluate the patient and all relevant studies to make final recommendations and determine timing of surgery.       I  spent 30 minutes counseling the patient face to face.  Lemond FORBES Cera, PA-C  09/21/2024 3:13 PM   Patient seen and examined, records and cath images reviewed.  58 yo woman with multiple CRF presents with acute heart failure.  W/u showed severe 2 vessel CAD with severe LV dysfunction (EF 25%). Coronaries appear small but are graftable.  CABG is best option with improved survival vs medical therapy.    She feels better after diuresis.  Needs medical optimization prior to CABG.  Will see back in office after her f/u with [facility] team as outpatient.  Elspeth MOTE Kerrin, MD [facility] [phone]

## 2024-09-21 NOTE — Progress Notes (Signed)
 Patient c/o chest pressure on 0400 vitals/round. 12 lead EKG obtained. On call hospitalist and cardiologist notified. Order received for serial troponins and sublingual nitroglycerin . Patient reports total relief of all chest pressure after 1 sublingual tablet.

## 2024-09-21 NOTE — Progress Notes (Signed)
  Progress Note   Patient: [name] [name] FMW:[id number] DOB: [birth year] DOA: 09/19/2024     2 DOS: the patient was seen and examined on 09/21/2024   Brief hospital course: [name] [name] was admitted to the hospital with the working diagnosis of heart failure exacerbation.   58 y.o. female with medical history significant of SVT status post ablation, heart failure, remote peripartum cardiomyopathy-resolved, generalized anxiety disorder, hyperlipidemia, non-insulin -dependent DM type II, migraine, chronic headache, and GERD presented to emergency department complaining of chest pain and dyspnea. Patient stated that she is having chest pain and dyspnea  over the course of last few weeks prior to admission, which is attributed to when her husband had a Helmick maneuver on her when she was choking.    Placed on furosemide for diuresis.  Echocardiogram with reduced LV systolic function.  10/01 cardiac catheterization with elevated filling pressures and positive coronary artery disease.   Assessment and Plan: * Acute on chronic systolic CHF (congestive heart failure) (HCC) Echocardiogram with reduced LV systolic function with EF 25 to 30%, mild to moderate LV cavity dilatation, grade I diastolic dysfunction, (impaired relaxation), RV systolic function preserved, LA and RA with mild dilatation, moderate pericardial effusion, circumferential with no tamponade, mild mitral valve regurgitation, mild tricuspid valve regurgitation.   10/01 cardiac catheterization  RA 11 PA 55/30 mean 39  PCWP 32   Pre and post capillary pulmonary hypertension   Urine output is 1,900 ml Systolic blood pressure 120 mmHg range.   Plan to continue dapagliflozin, losartan , metoprolol  and spironolactone .  Diuresis with furosemide 40 mg IV bid.   Coronary artery disease Patient with multivessel coronary artery disease.  LAD, D1 and PDA and PL branches good targets for CABG.   Consulted CT surgery for evaluation.    Continue medical therapy with aspirin  and statin.   History of paroxysmal SVT (supraventricular tachycardia) Continue metoprolol  for rate control.   Type 2 diabetes mellitus with hyperlipidemia (HCC) Continue glucose cover and monitoring with insulin  sliding scale.  Continue statin   Reactive airway disease No signs of acute exacerbation  Generalized anxiety disorder Continue with bupropion          Subjective: Patient with no chest pain or dyspnea, edema has improved, no PND or orthopnea   Physical Exam: Vitals:   09/21/24 1332 09/21/24 1337 09/21/24 1342 09/21/24 1502  BP: (!) 143/87 (!) 144/87 137/75   Pulse: (!) 118 (!) 119 (!) 102 99  Resp: 16 15 13 16   Temp:    98 F (36.7 C)  TempSrc:    Oral  SpO2: 92% 93% 92% 96%  Weight:      Height:       Neurology awake and alert ENT with mild pallor with no icterus Cardiovascular with S1 and S2 present and regular with no gallops or rubs, positive systolic murmur at the left lower sternal border No JVD Respiratory with mild rales at bases with no wheezing or rhonchi  Abdomen with no distention  Lower extremity trace edema   Data Reviewed:    Family Communication: I spoke with patient's mother at the bedside, we talked in detail about patient's condition, plan of care and prognosis and all questions were addressed.   Disposition: Status is: Inpatient Remains inpatient appropriate because: recovering heart failure   Planned Discharge Destination: Home    Author: Elidia Toribio Furnace, MD 09/21/2024 4:47 PM  For on call review [email or web address].

## 2024-09-21 NOTE — Plan of Care (Signed)

## 2024-09-21 NOTE — Telephone Encounter (Signed)
 Patient Product/process development scientist completed.    The patient is insured through [facility]. Patient has [employer], may use a copay card, and/or apply for patient assistance if available.    Ran test claim for Farxiga 10 mg and the current 30 day co-pay is $0.00.  Ran test claim for Jardiance  10 mg and the current 30 day co-pay is $0.00.  Ran test claim for Entresto 24-26 mg (Brand and Generic) and Requires Prior Authorization  This test claim was processed through [employer]- copay amounts may vary at other pharmacies due to [employer], or as the patient moves through the different stages of their insurance plan.     Reyes Sharps, CPHT Pharmacy Technician III Certified Patient Advocate [facility] Patient Advocate Team Direct Number: [phone]  Fax: [phone]

## 2024-09-21 NOTE — Assessment & Plan Note (Signed)
Old records personally reviewed, neurology follow up from 09/2021. Seropositive ocular myasthenia gravis, no significant bulbar, or limb weakness noted.  He is not longer on Mestinon or prednisone, never treated with long term steroids sparing agent.   

## 2024-09-21 NOTE — Telephone Encounter (Signed)
 ERROR

## 2024-09-21 NOTE — Assessment & Plan Note (Signed)
 Patient was placed on insulin sliding scale for glucose cover and monitoring during her hospitalization.  Continue statin.

## 2024-09-21 NOTE — Progress Notes (Signed)
 Rounding Note   Patient Name: [name] [name] Date of Encounter: 09/21/2024  [facility] Cardiologist: None   Subjective - Overnight the patient had an episode of chest pressure that lasted for several minutes.  Initial troponin 30 with repeat pending.  She reports ongoing SOB that is stable. - Currently no chest pain or other symptoms. - She was net -1.7 L overnight - N.p.o. since midnight  Scheduled Meds:  acetaminophen   650 mg Oral Q8H   aspirin   81 mg Oral Daily   atorvastatin   80 mg Oral Daily   buPROPion   150 mg Oral BID WC   citalopram   40 mg Oral Daily   enoxaparin  (LOVENOX ) injection  40 mg Subcutaneous Q24H   furosemide  40 mg Intravenous BID   insulin  aspart  0-5 Units Subcutaneous QHS   insulin  aspart  0-6 Units Subcutaneous TID WC   lidocaine  2 patch Transdermal Q24H   losartan   25 mg Oral Daily   metoprolol  tartrate  25 mg Oral BID   nitroGLYCERIN        pantoprazole   40 mg Oral Daily   sodium chloride  flush  3 mL Intravenous Q12H   sodium chloride  flush  3 mL Intravenous Q12H   spironolactone   25 mg Oral Daily   Continuous Infusions:  PRN Meds: albuterol  **AND** budesonide, nitroGLYCERIN , nitroGLYCERIN , oxyCODONE -acetaminophen , sodium chloride  flush, trimethobenzamide   Vital Signs  Vitals:   09/21/24 0406 09/21/24 0745 09/21/24 0750 09/21/24 0812  BP: 138/84  135/86   Pulse: 97 93    Resp: 20 18    Temp: 98.2 F (36.8 C) 97.8 F (36.6 C)    TempSrc: Oral Oral    SpO2: 95% 98%  97%  Weight: 89.3 kg     Height:        Intake/Output Summary (Last 24 hours) at 09/21/2024 1009 Last data filed at 09/21/2024 0807 Gross per 24 hour  Intake 313 ml  Output 1900 ml  Net -1587 ml      09/21/2024    4:06 AM 09/20/2024    3:52 PM 09/08/2024    3:03 PM  Last 3 Weights  Weight (lbs) 196 lb 13.9 oz 199 lb 15.3 oz 202 lb 9.6 oz  Weight (kg) 89.3 kg 90.7 kg 91.899 kg      Telemetry Short runs of SVT, otherwise NSR- Personally Reviewed  ECG   NSR, RBBB, LVH- Personally Reviewed  Physical Exam  GEN: Very pleasant well-appearing female in no acute distress.   Neck: No JVD Cardiac: RRR, no murmurs, rubs, or gallops.  Respiratory: Faint bibasilar rales, no wheezes or rhonchi. GI: Soft, nontender, non-distended  MS: No edema; No deformity. Neuro:  Nonfocal  Psych: Normal affect   Labs High Sensitivity Troponin:   Recent Labs  Lab 09/19/24 1654 09/19/24 2000 09/21/24 0628  TROPONINIHS 14 20* 30*     Chemistry Recent Labs  Lab 09/14/24 2151 09/19/24 1654 09/20/24 0547 09/20/24 1330 09/21/24 0318  NA 138   < > 138 137 137  K 4.0   < > 3.5 3.5 3.8  CL 102   < > 103 99 100  CO2 23   < > 23 25 25   GLUCOSE 127*   < > 161* 164* 172*  BUN 9   < > 7 9 12   CREATININE 0.63   < > 0.63 0.69 0.59  CALCIUM  9.2   < > 8.6* 8.8* 8.8*  MG  --   --   --  1.6* 1.7  PROT 6.4*  --  6.5  --   --   ALBUMIN 3.8  --  3.2*  --   --   AST 26  --  20  --   --   ALT 15  --  16  --   --   ALKPHOS 97  --  64  --   --   BILITOT 0.8  --  0.8  --   --   GFRNONAA >60   < > >60 >60 >60  ANIONGAP 13   < > 12 13 12    < > = values in this interval not displayed.    Lipids  Recent Labs  Lab 09/20/24 0547 09/21/24 0318  CHOL 131  --   TRIG 246* 306*  HDL 31*  --   LDLCALC 51  --   CHOLHDL 4.2  --     Hematology Recent Labs  Lab 09/19/24 1654 09/20/24 0547 09/21/24 0318  WBC 11.5* 6.4 6.7  RBC 4.80 4.38 4.72  HGB 12.3 11.2* 12.0  HCT 41.6 37.5 39.5  MCV 86.7 85.6 83.7  MCH 25.6* 25.6* 25.4*  MCHC 29.6* 29.9* 30.4  RDW 14.4 14.3 14.2  PLT 339 247 271   Thyroid  No results for input(s): TSH, FREET4 in the last 168 hours.  BNP Recent Labs  Lab 09/14/24 2151 09/19/24 1655  BNP  --  687.3*  PROBNP 2,286.0*  --     DDimer No results for input(s): DDIMER in the last 168 hours.   Radiology  VAS US  LOWER EXTREMITY VENOUS (DVT) Result Date: 09/20/2024  Lower Venous DVT Study Patient Name:  CAELI LINEHAN  Date of  Exam:   09/20/2024 Medical Rec #: [id number]        Accession #:    [id number] Date of Birth: November 13, 1966       Patient Gender: F Patient Age:   58 years Exam Location:  [facility] Procedure:      VAS US  LOWER EXTREMITY VENOUS (DVT) Referring Phys: MICAELA SPEAKER --------------------------------------------------------------------------------  Indications: Swelling. Other Indications: SOB, BLE swelling, chest pain and right rib pain. Pt states                    right rib pain x 1 week. Comparison Study: No recent studies Performing Technologist: Ricka Sturdivant-Jones RDMS, RVT  Examination Guidelines: A complete evaluation includes B-mode imaging, spectral Doppler, color Doppler, and power Doppler as needed of all accessible portions of each vessel. Bilateral testing is considered an integral part of a complete examination. Limited examinations for reoccurring indications may be performed as noted. The reflux portion of the exam is performed with the patient in reverse Trendelenburg.  +---------+---------------+---------+-----------+----------+--------------+ RIGHT    CompressibilityPhasicitySpontaneityPropertiesThrombus Aging +---------+---------------+---------+-----------+----------+--------------+ CFV      Full           Yes      Yes                                 +---------+---------------+---------+-----------+----------+--------------+ SFJ      Full                                                        +---------+---------------+---------+-----------+----------+--------------+ FV Prox  Full                                                        +---------+---------------+---------+-----------+----------+--------------+  FV Mid   Full           Yes      Yes                                 +---------+---------------+---------+-----------+----------+--------------+ FV DistalFull                                                         +---------+---------------+---------+-----------+----------+--------------+ PFV      Full                                                        +---------+---------------+---------+-----------+----------+--------------+ POP      Full           Yes      Yes                                 +---------+---------------+---------+-----------+----------+--------------+ PTV      Full                                                        +---------+---------------+---------+-----------+----------+--------------+ PERO     Full                                                        +---------+---------------+---------+-----------+----------+--------------+   +---------+---------------+---------+-----------+----------+--------------+ LEFT     CompressibilityPhasicitySpontaneityPropertiesThrombus Aging +---------+---------------+---------+-----------+----------+--------------+ CFV      Full           Yes      Yes                                 +---------+---------------+---------+-----------+----------+--------------+ SFJ      Full                                                        +---------+---------------+---------+-----------+----------+--------------+ FV Prox  Full                                                        +---------+---------------+---------+-----------+----------+--------------+ FV Mid   Full           Yes      Yes                                 +---------+---------------+---------+-----------+----------+--------------+  FV DistalFull                                                        +---------+---------------+---------+-----------+----------+--------------+ PFV      Full                                                        +---------+---------------+---------+-----------+----------+--------------+ POP      Full           Yes      Yes                                  +---------+---------------+---------+-----------+----------+--------------+ PTV      Full                                                        +---------+---------------+---------+-----------+----------+--------------+ PERO     Full                                                        +---------+---------------+---------+-----------+----------+--------------+     Summary: BILATERAL: - No evidence of deep vein thrombosis seen in the lower extremities, bilaterally. -No evidence of popliteal cyst, bilaterally.   *See table(s) above for measurements and observations. Electronically signed by Debby Robertson on 09/20/2024 at 4:08:39 PM.    Final    ECHOCARDIOGRAM COMPLETE Result Date: 09/20/2024    ECHOCARDIOGRAM REPORT   Patient Name:   DAJANIQUE ROBLEY Date of Exam: 09/20/2024 Medical Rec #:  [id number]       Height:       68.0 in Accession #:    [id number]      Weight:       202.6 lb Date of Birth:  October 28, 1966      BSA:          2.055 m Patient Age:    57 years        BP:           115/61 mmHg Patient Gender: F               HR:           89 bpm. Exam Location:  Inpatient Procedure: 2D Echo and Intracardiac Opacification Agent (Both Spectral and Color            Flow Doppler were utilized during procedure). Indications:    CHF  History:        Patient has prior history of Echocardiogram examinations. CHF                 and Cardiomyopathy.  Sonographer:    Norleen Amour Referring Phys: [id number] SUBRINA SUNDIL IMPRESSIONS  1. Left ventricular ejection fraction, by estimation, is 25 to 30%. The left ventricle has severely decreased  function. The left ventricle demonstrates regional wall motion abnormalities (see scoring diagram/findings for description). The left ventricular internal cavity size was mildly to moderately dilated. Left ventricular diastolic parameters are consistent with Grade I diastolic dysfunction (impaired relaxation).  2. Right ventricular systolic function is normal. The right  ventricular size is normal. Tricuspid regurgitation signal is inadequate for assessing PA pressure.  3. Left atrial size was mildly dilated.  4. Right atrial size was mildly dilated.  5. Moderate pericardial effusion. The pericardial effusion is circumferential. There is no evidence of cardiac tamponade.  6. The mitral valve is abnormal. Mild mitral valve regurgitation.  7. The aortic valve is abnormal. Aortic valve regurgitation is not visualized. Aortic valve sclerosis/calcification is present, without any evidence of aortic stenosis.  8. The inferior vena cava is normal in size with greater than 50% respiratory variability, suggesting right atrial pressure of 3 mmHg. Comparison(s): A prior study was performed on 05/15/2024. Prior images reviewed side by side. Left ventricular ejection fraction was 40-45% with akinesis of the distal anteroseptal wall and apex which appear more pronounced on today's study. There was no  pericardial effusion or mitral regurgitation noted at that time. Conclusion(s)/Recommendation(s): Critical findings reported to Georganna Archer. FINDINGS  Left Ventricle: Akinesis of the apex and periapical segments with severe hypokinesis of remaining segments. No thrombus noted. Left ventricular ejection fraction, by estimation, is 25 to 30%. The left ventricle has severely decreased function. The left ventricle demonstrates regional wall motion abnormalities. Definity  contrast agent was given IV to delineate the left ventricular endocardial borders. The left ventricular internal cavity size was mildly to moderately dilated. There is no left ventricular hypertrophy. Left ventricular diastolic parameters are consistent with Grade I diastolic dysfunction (impaired relaxation). Right Ventricle: The right ventricular size is normal. Right vetricular wall thickness was not assessed. Right ventricular systolic function is normal. Tricuspid regurgitation signal is inadequate for assessing PA pressure.  Left Atrium: Left atrial size was mildly dilated. Right Atrium: Right atrial size was mildly dilated. Pericardium: A moderately sized pericardial effusion is present. The pericardial effusion is circumferential. The pericardial effusion appears to contain coagulum posteriorly. There is no evidence of cardiac tamponade. Mitral Valve: The mitral valve is abnormal. There is moderate thickening of the mitral valve leaflet(s). Mild mitral valve regurgitation. Tricuspid Valve: The tricuspid valve is normal in structure. Tricuspid valve regurgitation is mild. Aortic Valve: The aortic valve is abnormal. Aortic valve regurgitation is not visualized. Aortic valve sclerosis/calcification is present, without any evidence of aortic stenosis. Aortic valve mean gradient measures 7.8 mmHg. Aortic valve peak gradient measures 15.4 mmHg. Aortic valve area, by VTI measures 1.27 cm. Pulmonic Valve: The pulmonic valve was normal in structure. Pulmonic valve regurgitation is trivial. Aorta: The aortic root and ascending aorta are structurally normal, with no evidence of dilitation. Venous: The inferior vena cava is normal in size with greater than 50% respiratory variability, suggesting right atrial pressure of 3 mmHg. IAS/Shunts: The interatrial septum was not well visualized. Additional Comments: There is a small pleural effusion.  LEFT VENTRICLE PLAX 2D LVIDd:         6.50 cm      Diastology LVIDs:         4.40 cm      LV e' medial:    7.62 cm/s LV PW:         0.90 cm      LV E/e' medial:  17.1 LV IVS:        0.90 cm  LV e' lateral:   8.27 cm/s LVOT diam:     2.00 cm      LV E/e' lateral: 15.7 LV SV:         46 LV SV Index:   22 LVOT Area:     3.14 cm  LV Volumes (MOD) LV vol d, MOD A2C: 210.0 ml LV vol d, MOD A4C: 186.0 ml LV vol s, MOD A2C: 148.0 ml LV vol s, MOD A4C: 137.0 ml LV SV MOD A2C:     62.0 ml LV SV MOD A4C:     186.0 ml LV SV MOD BP:      55.6 ml RIGHT VENTRICLE             IVC RV Basal diam:  3.00 cm     IVC diam:  1.80 cm RV S prime:     12.80 cm/s TAPSE (M-mode): 2.0 cm LEFT ATRIUM             Index        RIGHT ATRIUM           Index LA diam:        3.90 cm 1.90 cm/m   RA Area:     16.30 cm LA Vol (A2C):   57.6 ml 28.03 ml/m  RA Volume:   45.20 ml  22.00 ml/m LA Vol (A4C):   53.4 ml 25.99 ml/m LA Biplane Vol: 55.8 ml 27.15 ml/m  AORTIC VALVE                     PULMONIC VALVE AV Area (Vmax):    1.39 cm      PV Vmax:       0.91 m/s AV Area (Vmean):   1.33 cm      PV Peak grad:  3.3 mmHg AV Area (VTI):     1.27 cm AV Vmax:           196.25 cm/s AV Vmean:          126.250 cm/s AV VTI:            0.363 m AV Peak Grad:      15.4 mmHg AV Mean Grad:      7.8 mmHg LVOT Vmax:         87.00 cm/s LVOT Vmean:        53.500 cm/s LVOT VTI:          0.147 m LVOT/AV VTI ratio: 0.40  AORTA Ao Root diam: 2.40 cm Ao Asc diam:  3.10 cm MITRAL VALVE MV Area (PHT): 4.57 cm     SHUNTS MV Decel Time: 166 msec     Systemic VTI:  0.15 m MV E velocity: 130.00 cm/s  Systemic Diam: 2.00 cm MV A velocity: 105.00 cm/s MV E/A ratio:  1.24 Emeline Calender Electronically signed by Emeline Calender Signature Date/Time: 09/20/2024/2:01:37 PM    Final    CT Angio Chest PE W and/or Wo Contrast Result Date: 09/19/2024 CLINICAL DATA:  Positive D-dimer. Chest pain. Concern for pulmonary disease. EXAM: CT ANGIOGRAPHY CHEST WITH CONTRAST TECHNIQUE: Multidetector CT imaging of the chest was performed using the standard protocol during bolus administration of intravenous contrast. Multiplanar CT image reconstructions and MIPs were obtained to evaluate the vascular anatomy. RADIATION DOSE REDUCTION: This exam was performed according to the departmental dose-optimization program which includes automated exposure control, adjustment of the mA and/or kV according to patient size and/or use of iterative reconstruction technique. CONTRAST:  75mL OMNIPAQUE   IOHEXOL  350 MG/ML SOLN COMPARISON:  Chest radiograph dated 09/19/2024. FINDINGS: Cardiovascular: There is no  cardiomegaly. Small pericardial effusion measures approximately 7 mm in thickness. Three vessel coronary vascular calcification. Mild atherosclerotic calcification of the thoracic aorta. No aneurysmal dilatation. No pulmonary artery embolus identified. Mediastinum/Nodes: No hilar or mediastinal adenopathy. The esophagus and the thyroid  gland are grossly unremarkable. No mediastinal fluid collection. Lungs/Pleura: Small bilateral pleural effusions. There is partial compressive atelectasis of the lower lobes versus pneumonia. Patchy areas of airspace density in the upper lobes may represent edema, pneumonia, or combination. No pneumothorax. The central airways are patent. Upper Abdomen: No acute abnormality. Musculoskeletal: No acute osseous pathology. Mild degenerative changes. Review of the MIP images confirms the above findings. IMPRESSION: 1. No CT evidence of pulmonary artery embolus. 2. Small bilateral pleural effusions with partial compressive atelectasis of the lower lobes versus pneumonia. 3. Patchy areas of airspace density in the upper lobes may represent edema, pneumonia, or combination. 4.  Aortic Atherosclerosis (ICD10-I70.0). Electronically Signed   By: Vanetta Chou M.D.   On: 09/19/2024 21:11   DG Chest 2 View Result Date: 09/19/2024 CLINICAL DATA:  Chest pain. EXAM: CHEST - 2 VIEW COMPARISON:  Chest radiograph dated 09/14/2024. FINDINGS: There is mild cardiomegaly with vascular congestion and mild edema. Small bilateral pleural effusions and bibasilar atelectasis or infiltrate. No pneumothorax. No acute osseous pathology. IMPRESSION: Mild cardiomegaly with findings of CHF and small bilateral pleural effusions. Electronically Signed   By: Vanetta Chou M.D.   On: 09/19/2024 17:23    Cardiac Studies TTE 9/30: Independently reviewed by me demonstrating an EF between 20 to 25% with preserved RV function.  Pericardial fusion without evidence of tamponade.  Patient Profile   Mrs. Rottinghaus is  a 58 y.o. female with a PMH of coronary/aortic calcifications, HFmrEF, AT s/p ablation, HLD, DM2 who presented with the CC of SOB/swelling.  Cardiology is consulted for the evaluation and management of decompensated CHF.   Assessment & Plan   #New HFrEF #Cardiac Chest Pain :: Echocardiogram shows new systolic heart failure.  My greatest suspicion is that this is ischemic in etiology given her recently abnormal coronary CTA and intermittent episodes of chest pain.  The plan is to do both an LHC/RHC today to assess her coronary anatomy and right heart pressures.  She is currently on GDMT for CHF, and I will touch base with her pharmacist to see which SGLT2 inhibitor is affordable for her.  Will hold off on diuresis until the RHC results. - LHC/RHC today -Continue GDMT:  -Metoprolol  succinate 25 mg twice daily  -spironolactone  25 mg daily  -losartan  25 mg daily -Will ask pharmacy about price of SGLT2's -Diuresis pending cath results - Scheduled Tylenol  650 mg q8h - Lidocaine patches to rib cage - Follow-up second troponin from this morning    #HLD #Hypertriglyceridemia :: Triglycerides are still elevated today despite being fasting.  Her LDL is at goal, but she is on a low-dose of statin.  I will increase her dose of statin to maximal dose in order to drive her triglycerides down to a goal of <150. - Increase atorvastatin  to 80 mg daily -Repeat fasting triglycerides in 3 months   #DM2 - Insulin  per primary team        For questions or updates, please contact [facility] Please consult [email or web address] for contact info under       Signed, Georganna Archer, MD  09/21/2024, 10:09 AM

## 2024-09-21 NOTE — Interval H&P Note (Signed)
 History and Physical Interval Note:  09/21/2024 12:55 PM  [name] [name]  has presented today for surgery, with the diagnosis of blockage.  The various methods of treatment have been discussed with the patient and family. After consideration of risks, benefits and other options for treatment, the patient has consented to  Procedure(s): RIGHT/LEFT HEART CATH AND CORONARY ANGIOGRAPHY (N/A) and possible coronary angioplasty for new onset acute systolic heart failure, cardiac catheterization and possible coronary angioplasty as a surgical intervention.  The patient's history has been reviewed, patient examined, no change in status, stable for surgery.  I have reviewed the patient's chart and labs.  Questions were answered to the patient's satisfaction.     Gordy Bergamo

## 2024-09-21 NOTE — Progress Notes (Signed)
 Heart Failure Nurse Navigator Progress Note  PCP: [name], Karlynn GAILS, MD PCP-Cardiologist: None Admission Diagnosis: Acute on chronic congestive heart failure.  Admitted from: Home  Presentation:   [name] [name] presented with shortness of breath (feels as if a elephant is sitting on her chest) and BLE edema, rib pain x 1 week. Her husband performed the Heimlich on her 2 weeks ago when she was chocking.  She saw her primary care doctor and was started on torsemide recently after her BNP was elevated, despite this she has had worsening shortness of breath and orthopnea as well as leg swelling. BP 164/91, HR 102, BNP 687. Troponin 20, CT scan negative for a PE, however does show significant CHF. EKG showing sinus tachycardia heart rate 119, left ventricular hypertrophy with repolarization abnormality. Prolonged QTc heart rate 506. Chest x-ray showing cardiomegaly finding consistent with CHF and bilateral pleural effusion.Left and right heart cath on 09/21/2024 showed severe multivessel disease and elevated filling pressures. - CT surgery was consulted and agree that she should undergo CABG.  Plan for Once patient is optimized medically from heart failure, would benefit from consideration for surgical revascularization.   Patient and son were educated on the sign and symptoms of heart failure. Daily weights, when to call her doctor or go to the ED. Diet/ fluid restrictions, taking all medications as prescribed and attending all medical appointments. Patient verbalized her understanding of all education. A HF TOC appointment was scheduled for 09/27/2024 @ 2:45 pm . Requested by Cardiology to help with fluid control before she has her TCTS appointment for a CABG evaluation.    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   Hydradenitis    right   Neuromuscular disorder (HCC)      Social History   Socioeconomic History   Marital status: Married    Spouse name: Ronnie   Number of children: 2   Years of education: Not on file   Highest education level: Bachelor's degree (e.g., BA, AB, BS)  Occupational History   Occupation: EC teacher    Employer: [employer]  Tobacco Use   Smoking status: Some Days    Current packs/day: 0.50    Average packs/day: 0.5 packs/day for 15.0 years (7.5 ttl pk-yrs)    Types: Cigarettes   Smokeless tobacco: Never   Tobacco comments:    info given 12/14/2019  Vaping Use   Vaping status: Never Used  Substance and Sexual Activity   Alcohol use: No   Drug use: No   Sexual activity: Yes    Birth control/protection: Post-menopausal  Other Topics Concern   Not on file  Social History Narrative   Not on file   Social Drivers of Health   Financial Resource Strain: Low Risk  (07/03/2024)   Overall Financial Resource Strain (CARDIA)    Difficulty of Paying Living Expenses: Not hard at all  Food Insecurity: No Food Insecurity (07/03/2024)   Hunger Vital Sign    Worried About Running Out of Food in the Last Year: Never true    Ran Out of Food in the Last Year: Never true  Transportation Needs: No Transportation  Needs (07/03/2024)   PRAPARE - Administrator, Civil Service (Medical): No    Lack of Transportation (Non-Medical): No  Physical Activity: Inactive (07/03/2024)   Exercise Vital Sign    Days of Exercise per Week: 0 days    Minutes of Exercise per Session: Not on file  Stress: No Stress Concern Present (07/03/2024)   [employer] of Occupational Health - Occupational Stress Questionnaire    Feeling of Stress: Only a little  Social Connections: Socially Integrated (07/03/2024)   Social Connection and Isolation Panel    Frequency of Communication with Friends  and Family: More than three times a week    Frequency of Social Gatherings with Friends and Family: Once a week    Attends Religious Services: 1 to 4 times per year    Active Member of [employer] or Organizations: Yes    Attends Banker Meetings: 1 to 4 times per year    Marital Status: Married   Water engineer and Provision:  Detailed education and instructions provided on heart failure disease management including the following:  Signs and symptoms of Heart Failure When to call the physician Importance of daily weights Low sodium diet Fluid restriction Medication management Anticipated future follow-up appointments  Patient education given on each of the above topics.  Patient acknowledges understanding via teach back method and acceptance of all instructions.  Education Materials:  Living Better With Heart Failure Booklet, HF zone tool, & Daily Weight Tracker Tool.  Patient has scale at home: Yes Patient has pill box at home: Yes    High Risk Criteria for Readmission and/or Poor Patient Outcomes: Heart failure hospital admissions (last 6 months): 1  No Show rate: 7 % Difficult social situation: No, lives with husband and son.  Demonstrates medication adherence: yes Primary Language: English Literacy level: Reading, writing, and comprehension.   Barriers of Care:   Diet/ fluid restrictions Daily weights  Considerations/Referrals:   Referral made to Heart Failure Pharmacist Stewardship: NA Referral made to Heart Failure CSW/NCM TOC: NA Referral made to Heart & Vascular TOC clinic: Yes, 09/27/2024 @ 2:45 pm. Per cards to be seen for fluid control, before her TCTS appointment to schedule CABG.   Items for Follow-up on DC/TOC: Continued HF education Diet/ fluid restrictions/ daily weights   Stephane Haddock, BSN, RN Heart Failure Print production planner Chat Only

## 2024-09-21 NOTE — Hospital Course (Signed)
 [name] [name] was admitted to the hospital with the working diagnosis of heart failure exacerbation.   59 y.o. female with medical history significant of SVT status post ablation, heart failure, remote peripartum cardiomyopathy-resolved, generalized anxiety disorder, hyperlipidemia, non-insulin -dependent DM type II, migraine, chronic headache, and GERD presented to emergency department complaining of chest pain and dyspnea. Patient stated that she was having chest pain and dyspnea  over the course of last few weeks prior to admission, which is attributed to when her husband had a Helmick maneuver on her when she was choking. Noted progressive dyspnea, along lower extremity edema. Her primary care provider, checked BNP and was noted to be elevated. She was started on torsemide with no significant improvement in her symptoms. Because persistent symptoms she came to the hospital for further evaluation.   On her initial physical examination her blood pressure was 164/91, HR 119, RR 22 and 02 saturation 96%. Lungs with no wheezing or rhonchi, heart with S1 and S2 present and tachycardic with no gallops, rubs or murmurs, abdomen with no distention, positive lower extremity edema.   Na 1236, K 3,8 Cl 102 bicarbonate 22, glucose 161 bun 5 cr 0,63  BNP 687  High sensitive troponin 14  Wbc 11,5 hgb 12,3 plt 339   Chest radiograph hypoinflation, with cardiomegaly, bilateral hilar vascular congestion, bilateral pleural effusions.   CT chest with bilateral central ground glass opacities, with bilateral pleural effusions.  No evidence of pulmonary artery embolus.   EKG 119 bpm, left axis deviation, right bundle branch block, qtc 506, sinus rhythm with poor RR wave progression, no significant ST segment or T wave changes, positive LVH.   Placed on furosemide for diuresis.  Echocardiogram with reduced LV systolic function.  10/01 cardiac catheterization with elevated filling pressures and positive coronary artery  disease.  10/02 improved volume status, patient will follow up as outpatient with CT surgery for CABG evaluation.

## 2024-09-22 ENCOUNTER — Inpatient Hospital Stay (HOSPITAL_COMMUNITY)

## 2024-09-22 ENCOUNTER — Other Ambulatory Visit (HOSPITAL_COMMUNITY): Payer: Self-pay

## 2024-09-22 ENCOUNTER — Telehealth (HOSPITAL_COMMUNITY): Payer: Self-pay | Admitting: Pharmacy Technician

## 2024-09-22 ENCOUNTER — Encounter (HOSPITAL_COMMUNITY)

## 2024-09-22 ENCOUNTER — Encounter (HOSPITAL_COMMUNITY): Payer: Self-pay | Admitting: Cardiology

## 2024-09-22 DIAGNOSIS — I5023 Acute on chronic systolic (congestive) heart failure: Secondary | ICD-10-CM | POA: Diagnosis not present

## 2024-09-22 DIAGNOSIS — E876 Hypokalemia: Secondary | ICD-10-CM | POA: Diagnosis present

## 2024-09-22 LAB — BASIC METABOLIC PANEL WITH GFR
Anion gap: 10 (ref 5–15)
BUN: 10 mg/dL (ref 6–20)
CO2: 26 mmol/L (ref 22–32)
Calcium: 8.7 mg/dL — ABNORMAL LOW (ref 8.9–10.3)
Chloride: 101 mmol/L (ref 98–111)
Creatinine, Ser: 0.66 mg/dL (ref 0.44–1.00)
GFR, Estimated: >60 mL/min (ref >60)
Glucose, Bld: 165 mg/dL — ABNORMAL HIGH (ref 70–99)
Potassium: 3.1 mmol/L — ABNORMAL LOW (ref 3.5–5.1)
Sodium: 137 mmol/L (ref 135–145)

## 2024-09-22 LAB — MAGNESIUM: Magnesium: 1.5 mg/dL — ABNORMAL LOW (ref 1.7–2.4)

## 2024-09-22 LAB — GLUCOSE, CAPILLARY
Glucose-Capillary: 155 mg/dL — ABNORMAL HIGH (ref 70–99)
Glucose-Capillary: 127 mg/dL — ABNORMAL HIGH (ref 70–99)

## 2024-09-22 MED ORDER — NITROGLYCERIN 0.4 MG SL SUBL
0.4000 mg | SUBLINGUAL_TABLET | SUBLINGUAL | 0 refills | Status: DC | PRN
  Filled 2024-09-22: qty 25, 5d supply, fill #0

## 2024-09-22 MED ORDER — ATORVASTATIN CALCIUM 80 MG PO TABS
80.0000 mg | ORAL_TABLET | Freq: Every day | ORAL | 0 refills | Status: DC
  Filled 2024-09-22: qty 30, 30d supply, fill #0

## 2024-09-22 MED ORDER — POTASSIUM CHLORIDE CRYS ER 20 MEQ PO TBCR
40.0000 meq | EXTENDED_RELEASE_TABLET | Freq: Once | ORAL | Status: AC
  Administered 2024-09-22: 40 meq via ORAL
  Filled 2024-09-22: qty 2

## 2024-09-22 MED ORDER — METOPROLOL SUCCINATE ER 50 MG PO TB24
50.0000 mg | ORAL_TABLET | Freq: Every day | ORAL | 0 refills | Status: DC
  Filled 2024-09-22: qty 30, 30d supply, fill #0

## 2024-09-22 MED ORDER — FUROSEMIDE 40 MG PO TABS
40.0000 mg | ORAL_TABLET | Freq: Every day | ORAL | Status: DC
  Administered 2024-09-22: 40 mg via ORAL
  Filled 2024-09-22: qty 1

## 2024-09-22 MED ORDER — POTASSIUM CHLORIDE CRYS ER 20 MEQ PO TBCR: 20.0000 meq | EXTENDED_RELEASE_TABLET | Freq: Every day | ORAL | Status: DC

## 2024-09-22 MED ORDER — SODIUM CHLORIDE 0.9% FLUSH: 3.0000 mL | INTRAVENOUS | Status: DC | PRN

## 2024-09-22 MED ORDER — SODIUM CHLORIDE 0.9 % IV SOLN: 250.0000 mL | INTRAVENOUS | Status: DC | PRN

## 2024-09-22 MED ORDER — MAGNESIUM SULFATE 2 GM/50ML IV SOLN
2.0000 g | Freq: Once | INTRAVENOUS | Status: AC
  Administered 2024-09-22: 2 g via INTRAVENOUS
  Filled 2024-09-22: qty 50

## 2024-09-22 MED ORDER — METOPROLOL SUCCINATE ER 50 MG PO TB24
50.0000 mg | ORAL_TABLET | Freq: Every day | ORAL | Status: DC
  Administered 2024-09-22: 50 mg via ORAL
  Filled 2024-09-22: qty 1

## 2024-09-22 MED ORDER — SACUBITRIL-VALSARTAN 24-26 MG PO TABS
1.0000 | ORAL_TABLET | Freq: Two times a day (BID) | ORAL | Status: DC
  Administered 2024-09-22: 1 via ORAL
  Filled 2024-09-22: qty 1

## 2024-09-22 MED ORDER — POTASSIUM CHLORIDE CRYS ER 20 MEQ PO TBCR
20.0000 meq | EXTENDED_RELEASE_TABLET | Freq: Every day | ORAL | 0 refills | Status: DC
  Filled 2024-09-22: qty 30, 30d supply, fill #0

## 2024-09-22 MED ORDER — FUROSEMIDE 40 MG PO TABS
40.0000 mg | ORAL_TABLET | Freq: Every day | ORAL | 0 refills | Status: DC
  Filled 2024-09-22: qty 30, 30d supply, fill #0

## 2024-09-22 MED ORDER — SODIUM CHLORIDE 0.9% FLUSH
3.0000 mL | Freq: Two times a day (BID) | INTRAVENOUS | Status: DC
Start: 2024-09-22 — End: 2024-09-22

## 2024-09-22 MED ORDER — SACUBITRIL-VALSARTAN 24-26 MG PO TABS
1.0000 | ORAL_TABLET | Freq: Two times a day (BID) | ORAL | 0 refills | Status: DC
  Filled 2024-09-22: qty 60, 30d supply, fill #0

## 2024-09-22 NOTE — Telephone Encounter (Signed)
 Pharmacy Patient Advocate Encounter   Received notification from Inpatient Request that prior authorization for Sacubitril-Valsartan 24-26MG  tablets  is required/requested.   Insurance verification completed.   The patient is insured through [facility].   Per test claim: PA required; PA submitted to above mentioned insurance via Latent Key/confirmation #/EOC [id number] Status is pending

## 2024-09-22 NOTE — TOC Transition Note (Signed)
 Transition of Care [facility]) - Discharge Note   Patient Details  Name: KERSTIN CRUSOE MRN: [id number] Date of Birth: March 01, 1966  Transition of Care [facility]) CM/SW Contact:  Waddell Barnie Rama, RN Phone Number: 09/22/2024, 2:24 PM   Clinical Narrative:    For dc today, has no needs.  TOC is filling meds.  They were working on [employer], patient states she is ready to leave and to have [facility] pharmacy to send the entresto to [facility] [address]. She will pick up the ones that are ready now but will [facility] the [facility].          Patient Goals and CMS Choice            Discharge Placement                       Discharge Plan and Services Additional resources added to the After Visit Summary for                                       Social Drivers of Health (SDOH) Interventions SDOH Screenings   Food Insecurity: No Food Insecurity (09/22/2024)  Housing: Low Risk  (09/22/2024)  Transportation Needs: No Transportation Needs (09/21/2024)  Utilities: Not At Risk (09/22/2024)  Alcohol Screen: Low Risk  (09/21/2024)  Depression (PHQ2-9): Medium Risk (09/08/2024)  Financial Resource Strain: Low Risk  (09/21/2024)  Physical Activity: Inactive (07/03/2024)  Social Connections: Socially Integrated (07/03/2024)  Stress: No Stress Concern Present (07/03/2024)  Tobacco Use: Medium Risk (09/21/2024)     Readmission Risk Interventions    09/21/2024   11:15 AM 09/21/2024   11:11 AM  Readmission Risk Prevention Plan  Transportation Screening Complete Complete  PCP or Specialist Appt within 5-7 Days Complete Complete  Home Care Screening Complete Complete  Medication Review (RN CM) Complete Complete

## 2024-09-22 NOTE — Progress Notes (Signed)
 Rounding Note   Patient Name: [name] [name] Date of Encounter: 09/22/2024  [facility] Cardiologist: None   Subjective - Patient underwent LHC/RHC yesterday demonstrating severe multivessel disease and elevated filling pressures. - CT surgery was consulted and agree that she should undergo CABG - Today the patient says that her breathing is much better and her chest pain has resolved.  She is eager to go home. - She was diuresed net -3.7 L overnight - She denies PND, orthopnea, palpitations, chest pain, SOB  Scheduled Meds:  acetaminophen   650 mg Oral Q8H   aspirin   81 mg Oral Daily   atorvastatin   80 mg Oral Daily   buPROPion   150 mg Oral BID WC   citalopram   40 mg Oral Daily   dapagliflozin propanediol  10 mg Oral Daily   enoxaparin  (LOVENOX ) injection  40 mg Subcutaneous Q24H   furosemide  40 mg Oral Daily   insulin  aspart  0-5 Units Subcutaneous QHS   insulin  aspart  0-6 Units Subcutaneous TID WC   lidocaine  2 patch Transdermal Q24H   metoprolol  succinate  50 mg Oral Daily   pantoprazole   40 mg Oral Daily   sacubitril-valsartan  1 tablet Oral BID   sodium chloride  flush  3 mL Intravenous Q12H   sodium chloride  flush  3 mL Intravenous Q12H   spironolactone   25 mg Oral Daily   Continuous Infusions:  PRN Meds: albuterol  **AND** budesonide, nitroGLYCERIN , oxyCODONE -acetaminophen , sodium chloride  flush, trimethobenzamide   Vital Signs  Vitals:   09/21/24 2002 09/22/24 0043 09/22/24 0535 09/22/24 0727  BP: 121/61 131/63 121/62 132/74  Pulse: 96  94 96  Resp: 19 18 19 20   Temp: 98.1 F (36.7 C) 97.9 F (36.6 C) 97.8 F (36.6 C) 97.7 F (36.5 C)  TempSrc: Oral Oral Oral Oral  SpO2: 94% 94% 97% 98%  Weight:   87 kg   Height:        Intake/Output Summary (Last 24 hours) at 09/22/2024 1300 Last data filed at 09/22/2024 0539 Gross per 24 hour  Intake 480 ml  Output 3100 ml  Net -2620 ml      09/22/2024    5:35 AM 09/21/2024    4:06 AM 09/20/2024     3:52 PM  Last 3 Weights  Weight (lbs) 191 lb 12.8 oz 196 lb 13.9 oz 199 lb 15.3 oz  Weight (kg) 87 kg 89.3 kg 90.7 kg      Telemetry NSR, RBBB- Personally Reviewed  ECG  No new ECG  Physical Exam  GEN: Very pleasant female laying in bed in no acute distress.   Neck: No JVD Cardiac: RRR, no murmurs, rubs, or gallops.  Respiratory: Faint bibasilar rales, no wheezes or rhonchi GI: Soft, nontender, non-distended  MS: No edema; No deformity.  WWP Neuro:  Nonfocal  Psych: Normal affect   Labs High Sensitivity Troponin:   Recent Labs  Lab 09/19/24 1654 09/19/24 2000 09/21/24 0628 09/21/24 0910  TROPONINIHS 14 20* 30* 31*     Chemistry Recent Labs  Lab 09/20/24 0547 09/20/24 1330 09/21/24 0318 09/21/24 1321 09/21/24 1323 09/21/24 1325 09/22/24 0231  NA 138 137 137   < > 140 140 137  K 3.5 3.5 3.8   < > 3.4* 3.4* 3.1*  CL 103 99 100  --   --   --  101  CO2 23 25 25   --   --   --  26  GLUCOSE 161* 164* 172*  --   --   --  165*  BUN 7 9 12   --   --   --  10  CREATININE 0.63 0.69 0.59  --   --   --  0.66  CALCIUM  8.6* 8.8* 8.8*  --   --   --  8.7*  MG  --  1.6* 1.7  --   --   --  1.5*  PROT 6.5  --   --   --   --   --   --   ALBUMIN 3.2*  --   --   --   --   --   --   AST 20  --   --   --   --   --   --   ALT 16  --   --   --   --   --   --   ALKPHOS 64  --   --   --   --   --   --   BILITOT 0.8  --   --   --   --   --   --   GFRNONAA >60 >60 >60  --   --   --  >60  ANIONGAP 12 13 12   --   --   --  10   < > = values in this interval not displayed.    Lipids  Recent Labs  Lab 09/20/24 0547 09/21/24 0318  CHOL 131  --   TRIG 246* 306*  HDL 31*  --   LDLCALC 51  --   CHOLHDL 4.2  --     Hematology Recent Labs  Lab 09/19/24 1654 09/20/24 0547 09/21/24 0318 09/21/24 1321 09/21/24 1323 09/21/24 1325  WBC 11.5* 6.4 6.7  --   --   --   RBC 4.80 4.38 4.72  --   --   --   HGB 12.3 11.2* 12.0 12.6 12.6 12.6  HCT 41.6 37.5 39.5 37.0 37.0 37.0  MCV 86.7  85.6 83.7  --   --   --   MCH 25.6* 25.6* 25.4*  --   --   --   MCHC 29.6* 29.9* 30.4  --   --   --   RDW 14.4 14.3 14.2  --   --   --   PLT 339 247 271  --   --   --    Thyroid  No results for input(s): TSH, FREET4 in the last 168 hours.  BNP Recent Labs  Lab 09/19/24 1655  BNP 687.3*    DDimer No results for input(s): DDIMER in the last 168 hours.   Radiology  CARDIAC CATHETERIZATION Addendum Date: 09/21/2024 Cardiac Catheterization 09/21/24: Hemodynamic data: Moderate pulmonary hypertension with markedly elevated LVEDP consistent with WHO group 2 PAH.  RV function is preserved with PAPi 2.3. COP 6.12 andn CI 3.02 by Fick LVEDP 33 mmHg.  There is no pressure gradient across the aortic valve. Angiographic data: LM: Calcified but widely patent. LAD: Calcified in the proximal segment.  There is moderate diffuse disease in the proximal segment.  There is a proximal 60% stenosis followed by 80% stenosis distal to the origin of D1 with FFR 0.77, indicating hemodynamically significant stenosis.  Large D1 with proximal 80% stenosis with FFR 0.67, hemodynamically significant.  Distal to apical LAD has 90% focal stenosis. LCx: Gives origin to large OM 2 and OM 3, mild diffuse disease. RCA: Large-caliber vessel, gives origin to large PDA and large PL branch.  Proximal segment has a 99% calcific  stenosis. Impression and recommendations: Patient's LAD and D1 and PDA and PL branches are good targets for CABG.  Once patient is optimized medically from heart failure, would benefit from consideration for surgical revascularization.  Patient was started on Farxiga today. Suspect can be discharged once stable with elective CABG.   Result Date: September 29, 2024 Images from the original result were not included. Cardiac Catheterization 2024-09-29: Hemodynamic data: Moderate pulmonary hypertension with markedly elevated LVEDP consistent with WHO group 2 PAH.  RV function is preserved with PAPi 2.3. LVEDP 33 mmHg.  There  is no pressure gradient across the aortic valve. Angiographic data: LM: Calcified but widely patent. LAD: Calcified in the proximal segment.  There is moderate diffuse disease in the proximal segment.  There is a proximal 60% stenosis followed by 80% stenosis distal to the origin of D1 with FFR 0.77, indicating hemodynamically significant stenosis.  Large D1 with proximal 80% stenosis with FFR 0.67, hemodynamically significant.  Distal to apical LAD has 90% focal stenosis. LCx: Gives origin to large OM 2 and OM 3, mild diffuse disease. RCA: Large-caliber vessel, gives origin to large PDA and large PL branch.  Proximal segment has a 99% calcific stenosis. Impression and recommendations: Patient's LAD and D1 and PDA and PL branches are good targets for CABG.  Once patient is optimized medically from heart failure, would benefit from consideration for surgical revascularization.  Patient was started on Farxiga today.    Cardiac Studies  Cardiac Catheterization September 29, 2024: Hemodynamic data: Moderate pulmonary hypertension with markedly elevated LVEDP consistent with WHO group 2 PAH.  RV function is preserved with PAPi 2.3. COP 6.12 andn CI 3.02 by Fick LVEDP 33 mmHg.  There is no pressure gradient across the aortic valve.   Angiographic data: LM: Calcified but widely patent. LAD: Calcified in the proximal segment.  There is moderate diffuse disease in the proximal segment.  There is a proximal 60% stenosis followed by 80% stenosis distal to the origin of D1 with FFR 0.77, indicating hemodynamically significant stenosis.  Large D1 with proximal 80% stenosis with FFR 0.67, hemodynamically significant.  Distal to apical LAD has 90% focal stenosis. LCx: Gives origin to large OM 2 and OM 3, mild diffuse disease. RCA: Large-caliber vessel, gives origin to large PDA and large PL branch.  Proximal segment has a 99% calcific stenosis.      Impression and recommendations: Patient's LAD and D1 and PDA and PL  branches are good targets for CABG.  Once patient is optimized medically from heart failure, would benefit from consideration for surgical revascularization.  Patient was started on Farxiga today. Suspect can be discharged once stable with elective CABG.   Patient Profile   Mrs. Venuti is a 58 y.o. female with a PMH of coronary/aortic calcifications, HFmrEF, AT s/p ablation, HLD, DM2 who presented with the CC of SOB/swelling. Cardiology is consulted for the evaluation and management of decompensated CHF.   Assessment & Plan   #New Acute Decompensated ICM (EF = 25-30%) #Severe Obstructive Multivessel CAD :: Patient admitted with acute systolic heart failure.  LHC yesterday demonstrated severe multivessel disease.  CT surgery was consulted and they agreed with CABG as an outpatient.  The patient appears very well today and is almost completely euvolemic after aggressive diuresis.  I think she is stable for discharge.  Follow-up with heart failure, CTS and me have been arranged.  She will be discharged on her new GDMT regiment along with standing diuretic. - LHC/RHC today -Continue GDMT:  - Change to  metoprolol  succinate 50 mg daily -spironolactone  25 mg daily  - Stopped losartan  25 mg daily -Start Entresto 24-26 - Start dapagliflozin 10 mg daily - Start Lasix 40 mg p.o. daily -Cardiology follow-up is arranged     #HLD #Hypertriglyceridemia :: Triglycerides are still elevated today despite being fasting.  Her LDL is at goal, but she is on a low-dose of statin.  I will increase her dose of statin to maximal dose in order to drive her triglycerides down to a goal of <150. - Increase atorvastatin  to 80 mg daily -Repeat fasting triglycerides in 3 months   #DM2 - Insulin  per primary team    [facility] will sign off.   The patient is ready for discharge today from a cardiac standpoint. Medication Recommendations: Metoprolol  succinate 50 mg daily, Entresto 24-26 twice daily,  dapagliflozin 10 mg daily, spironolactone  25 mg daily Other recommendations (labs, testing, etc): Repeat BMP and mag at follow-up Follow up as an outpatient: Heart failure, CT surgery, general cardiology -all established For questions or updates, please contact [facility] Please consult [email or web address] for contact info under       Signed, Georganna Archer, MD  09/22/2024, 1:00 PM

## 2024-09-22 NOTE — Discharge Summary (Addendum)
 Physician Discharge Summary   Patient: [name] [name] MRN: [id number] DOB: 12-Apr-1966  Admit date:     09/19/2024  Discharge date: 09/22/24  Discharge Physician: Elidia Sieving Eulalia Ellerman   PCP: Garald Karlynn GAILS, MD   Recommendations at discharge:    Patient was placed on guideline directed medical therapy with entresto, metoprolol  succinate (increased dose), spironolactone  and SGLT 2 inh.  Discontinued losartan  and diltiazem .  Diuresis with furosemide 40 mg and added Kcl supplementation.  Added aspirin  and increased dose of atorvastatin , take as needed SL nitroglycerin .  Follow up renal function and electrolytes in 7 days as outpatient Follow up with Dr Garald in 7 to 10 days Follow up with Cardiology and CT surgery as scheduled.   Discharge Diagnoses: Principal Problem:   Acute on chronic systolic CHF (congestive heart failure) (HCC) Active Problems:   Coronary artery disease   History of paroxysmal SVT (supraventricular tachycardia)   Type 2 diabetes mellitus with hyperlipidemia (HCC)   Reactive airway disease   Generalized anxiety disorder  Resolved Problems:   * No resolved hospital problems. [facility] Course: [name] [name] was admitted to the hospital with the working diagnosis of heart failure exacerbation.   58 y.o. female with medical history significant of SVT status post ablation, heart failure, remote peripartum cardiomyopathy-resolved, generalized anxiety disorder, hyperlipidemia, non-insulin -dependent DM type II, migraine, chronic headache, and GERD presented to emergency department complaining of chest pain and dyspnea. Patient stated that she was having chest pain and dyspnea  over the course of last few weeks prior to admission, which is attributed to when her husband had a Helmick maneuver on her when she was choking. Noted progressive dyspnea, along lower extremity edema. Her primary care provider, checked BNP and was noted to be elevated. She was started  on torsemide with no significant improvement in her symptoms. Because persistent symptoms she came to the hospital for further evaluation.   On her initial physical examination her blood pressure was 164/91, HR 119, RR 22 and 02 saturation 96%. Lungs with no wheezing or rhonchi, heart with S1 and S2 present and tachycardic with no gallops, rubs or murmurs, abdomen with no distention, positive lower extremity edema.   Na 1236, K 3,8 Cl 102 bicarbonate 22, glucose 161 bun 5 cr 0,63  BNP 687  High sensitive troponin 14  Wbc 11,5 hgb 12,3 plt 339   Chest radiograph hypoinflation, with cardiomegaly, bilateral hilar vascular congestion, bilateral pleural effusions.   CT chest with bilateral central ground glass opacities, with bilateral pleural effusions.  No evidence of pulmonary artery embolus.   EKG 119 bpm, left axis deviation, right bundle branch block, qtc 506, sinus rhythm with poor RR wave progression, no significant ST segment or T wave changes, positive LVH.   Placed on furosemide for diuresis.  Echocardiogram with reduced LV systolic function.  10/01 cardiac catheterization with elevated filling pressures and positive coronary artery disease.  10/02 improved volume status, patient will follow up as outpatient with CT surgery for CABG evaluation.   Assessment and Plan: * Acute on chronic systolic CHF (congestive heart failure) (HCC) Echocardiogram with reduced LV systolic function with EF 25 to 30%, mild to moderate LV cavity dilatation, grade I diastolic dysfunction, (impaired relaxation), RV systolic function preserved, LA and RA with mild dilatation, moderate pericardial effusion, circumferential with no tamponade, mild mitral valve regurgitation, mild tricuspid valve regurgitation.   10/01 cardiac catheterization  RA 11 PA 55/30 mean 39  PCWP 32   Pre and post capillary  pulmonary hypertension   Patient was placed on IV furosemide for diuresis, negative fluid balance was  achieved, - 5,307 ml, with significant improvement in her symptoms.   Plan to continue dapagliflozin, losartan , metoprolol  and spironolactone .  Loop diuretic with furosemide 40 mg po daily.  Follow up with as outpatient.   Coronary artery disease Patient with multivessel coronary artery disease.  LAD, D1 and PDA and PL branches good targets for CABG.   Continue medical therapy with aspirin  and statin.  As needed sl nitroglycerin .  Plan to follow up as outpatient with cardiovascular surgery, for possible CABG.   History of paroxysmal SVT (supraventricular tachycardia) Continue metoprolol  for rate control.   Hypokalemia Hypomagnesemia.   Renal function remained stable, patient received Kcl and Mg sulfate for diuresis induced electrolyte disturbances.    At the time of discharge her renal function has a serum cr of 0,66 with K at 3.1 and serum bicarbonate at 26  Na 137 and Mg 1.5   Before discharge she will receive 80 meq Kcl and 4 g mag sulfate.  Continue furosemide, SGLT 2 inh, and spironolactone  for diuresis.  K supplementation and follow up renal function and electrolytes as outpatient.   Type 2 diabetes mellitus with hyperlipidemia (HCC) Patient was placed on insulin  sliding scale for glucose cover and monitoring.  Hr glucose remained stable, her discharge fasting glucose is 165 mg.dl.  At discharge will resume metformin , repaginide and semaglutide .  Empagliflozin  linagliptin .   Continue statin   Generalized anxiety disorder Continue with bupropion    Reactive airway disease No signs of acute exacerbation Continue with albuterol -budesonide inhaler.       Consultants: cardiology  Procedures performed: cardiac catheterization   Disposition: Home Diet recommendation:  Cardiac and Carb modified diet DISCHARGE MEDICATION: Allergies as of 09/22/2024       Reactions   Adenosine     SOB and pressure for 20 seconds        Medication List     STOP taking these  medications    diltiazem  30 MG tablet Commonly known as: Cardizem    losartan  25 MG tablet Commonly known as: COZAAR    oxyCODONE -acetaminophen  5-325 MG tablet Commonly known as: PERCOCET/ROXICET       TAKE these medications    Airsupra  90-80 MCG/ACT Aero Generic drug: Albuterol -Budesonide Inhale 2 Inhalations into the lungs 4 (four) times daily as needed.   ALPRAZolam  0.5 MG tablet Commonly known as: XANAX  TAKE 1 TABLET(0.5 MG) BY MOUTH TWICE DAILY What changed:  how much to take how to take this when to take this reasons to take this additional instructions   aspirin  81 MG chewable tablet Chew 1 tablet (81 mg total) by mouth daily.   atorvastatin  80 MG tablet Commonly known as: LIPITOR Take 1 tablet (80 mg total) by mouth daily. Start taking on: September 23, 2024 What changed:  medication strength how much to take how to take this when to take this additional instructions   buPROPion  150 MG 12 hr tablet Commonly known as: WELLBUTRIN  SR TAKE 1 TABLET BY MOUTH TWICE DAILY   Cholecalciferol  25 MCG (1000 UT) tablet Commonly known as: EQL Vitamin D3 Take 1 tablet (1,000 Units total) by mouth daily.   citalopram  40 MG tablet Commonly known as: CELEXA  Take 1 tablet (40 mg total) by mouth daily.   furosemide 40 MG tablet Commonly known as: LASIX Take 1 tablet (40 mg total) by mouth daily. Start taking on: September 23, 2024   gabapentin  100 MG capsule Commonly known  as: NEURONTIN  TAKE 1 CAPSULE(100 MG) BY MOUTH THREE TIMES DAILY   Glyxambi  25-5 MG Tabs Generic drug: Empagliflozin -linaGLIPtin  TAKE 1 TABLET BY MOUTH DAILY   metFORMIN  500 MG tablet Commonly known as: GLUCOPHAGE  TAKE 2 TABLETS TWICE DAILY WITH FOOD   metoprolol  succinate 50 MG 24 hr tablet Commonly known as: TOPROL -XL Take 1 tablet (50 mg total) by mouth daily. Take with or immediately following a meal. Start taking on: September 23, 2024 What changed:  medication strength See the new  instructions.   nitroGLYCERIN  0.4 MG SL tablet Commonly known as: NITROSTAT  Place 1 tablet (0.4 mg total) under the tongue every 5 (five) minutes as needed for chest pain.   pantoprazole  40 MG tablet Commonly known as: PROTONIX  TAKE 1 TABLET(40 MG) BY MOUTH TWICE DAILY   potassium chloride  SA 20 MEQ tablet Commonly known as: KLOR-CON  M Take 1 tablet (20 mEq total) by mouth daily. Start taking on: September 23, 2024   repaglinide  2 MG tablet Commonly known as: PRANDIN  TAKE 1 TABLET BY MOUTH THREE TIMES DAILY BEFORE MEALS   sacubitril-valsartan 24-26 MG Commonly known as: ENTRESTO Take 1 tablet by mouth 2 (two) times daily.   Semaglutide  (2 MG/DOSE) 8 MG/3ML Sopn 2 mg sq weekly What changed:  how much to take how to take this when to take this additional instructions   spironolactone  25 MG tablet Commonly known as: ALDACTONE  Take 1 tablet (25 mg total) by mouth daily.        Follow-up Information     [name], Karlynn GAILS, MD Follow up on 09/28/2024.   Specialty: Internal Medicine Why: 11:20 for hospital follow up Contact information: [address] [place] [address] [phone]         [facility]. Go in 4 day(s).   Specialty: Cardiology Why: Hospital follow up 09/27/2024 @ 2:45 pm PLEASE bring a current medication list to appointment FREE valet parking, Entrance C, off [address] [facility] and [facility] entrance Contact information: [address] [place]  [phone]               Discharge Exam: Fredricka Weights   09/20/24 1552 09/21/24 0406 09/22/24 0535  Weight: 90.7 kg 89.3 kg 87 kg   BP 132/74 (BP Location: Right Arm)   Pulse 96   Temp 97.7 F (36.5 C) (Oral)   Resp 20   Ht 5' 8 (1.727 m)   Wt 87 kg   LMP 03/02/2017   SpO2 98%   BMI 29.16 kg/m   Patient is feeling well, no dyspnea or chest pain, no PND, orthopnea or lower  extremity edema   Neurology awake and alert ENT with mild pallor with no icterus Cardiovascular with S1 and S2 present and regular with no gallops, rubs or murmurs No JVD Respiratory with no rales or wheezing, no rhonchi  Abdomen with no distention  No lower extremity edema   Condition at discharge: stable  The results of significant diagnostics from this hospitalization (including imaging, microbiology, ancillary and laboratory) are listed below for reference.   Imaging Studies: CARDIAC CATHETERIZATION Addendum Date: 09/21/2024 Cardiac Catheterization 09/21/24: Hemodynamic data: Moderate pulmonary hypertension with markedly elevated LVEDP consistent with WHO group 2 PAH.  RV function is preserved with PAPi 2.3. COP 6.12 andn CI 3.02 by Fick LVEDP 33 mmHg.  There is no pressure gradient across the aortic valve. Angiographic data: LM: Calcified but widely patent. LAD: Calcified in the proximal segment.  There is moderate diffuse disease in the proximal segment.  There is a proximal 60% stenosis followed by 80% stenosis distal to the origin of D1 with FFR 0.77, indicating hemodynamically significant stenosis.  Large D1 with proximal 80% stenosis with FFR 0.67, hemodynamically significant.  Distal to apical LAD has 90% focal stenosis. LCx: Gives origin to large OM 2 and OM 3, mild diffuse disease. RCA: Large-caliber vessel, gives origin to large PDA and large PL branch.  Proximal segment has a 99% calcific stenosis. Impression and recommendations: Patient's LAD and D1 and PDA and PL branches are good targets for CABG.  Once patient is optimized medically from heart failure, would benefit from consideration for surgical revascularization.  Patient was started on Farxiga today. Suspect can be discharged once stable with elective CABG.   Result Date: 09/21/2024 Images from the original result were not included. Cardiac Catheterization 09/21/24: Hemodynamic data: Moderate pulmonary hypertension with  markedly elevated LVEDP consistent with WHO group 2 PAH.  RV function is preserved with PAPi 2.3. LVEDP 33 mmHg.  There is no pressure gradient across the aortic valve. Angiographic data: LM: Calcified but widely patent. LAD: Calcified in the proximal segment.  There is moderate diffuse disease in the proximal segment.  There is a proximal 60% stenosis followed by 80% stenosis distal to the origin of D1 with FFR 0.77, indicating hemodynamically significant stenosis.  Large D1 with proximal 80% stenosis with FFR 0.67, hemodynamically significant.  Distal to apical LAD has 90% focal stenosis. LCx: Gives origin to large OM 2 and OM 3, mild diffuse disease. RCA: Large-caliber vessel, gives origin to large PDA and large PL branch.  Proximal segment has a 99% calcific stenosis. Impression and recommendations: Patient's LAD and D1 and PDA and PL branches are good targets for CABG.  Once patient is optimized medically from heart failure, would benefit from consideration for surgical revascularization.  Patient was started on Farxiga today.   VAS US  LOWER EXTREMITY VENOUS (DVT) Result Date: 09/20/2024  Lower Venous DVT Study Patient Name:  [name] [name]  Date of Exam:   09/20/2024 Medical Rec #: [id number]        Accession #:    [id number] Date of Birth: 08-24-1966       Patient Gender: F Patient Age:   83 years Exam Location:  [facility] Procedure:      VAS US  LOWER EXTREMITY VENOUS (DVT) Referring Phys: MICAELA SPEAKER --------------------------------------------------------------------------------  Indications: Swelling. Other Indications: SOB, BLE swelling, chest pain and right rib pain. Pt states                    right rib pain x 1 week. Comparison Study: No recent studies Performing Technologist: Ricka Sturdivant-Jones RDMS, RVT  Examination Guidelines: A complete evaluation includes B-mode imaging, spectral Doppler, color Doppler, and power Doppler as needed of all accessible portions of each vessel.  Bilateral testing is considered an integral part of a complete examination. Limited examinations for reoccurring indications may be performed as noted. The reflux portion of the exam is performed with the patient in reverse Trendelenburg.  +---------+---------------+---------+-----------+----------+--------------+ RIGHT    CompressibilityPhasicitySpontaneityPropertiesThrombus Aging +---------+---------------+---------+-----------+----------+--------------+ CFV      Full           Yes      Yes                                 +---------+---------------+---------+-----------+----------+--------------+ SFJ  Full                                                        +---------+---------------+---------+-----------+----------+--------------+ FV Prox  Full                                                        +---------+---------------+---------+-----------+----------+--------------+ FV Mid   Full           Yes      Yes                                 +---------+---------------+---------+-----------+----------+--------------+ FV DistalFull                                                        +---------+---------------+---------+-----------+----------+--------------+ PFV      Full                                                        +---------+---------------+---------+-----------+----------+--------------+ POP      Full           Yes      Yes                                 +---------+---------------+---------+-----------+----------+--------------+ PTV      Full                                                        +---------+---------------+---------+-----------+----------+--------------+ PERO     Full                                                        +---------+---------------+---------+-----------+----------+--------------+   +---------+---------------+---------+-----------+----------+--------------+ LEFT      CompressibilityPhasicitySpontaneityPropertiesThrombus Aging +---------+---------------+---------+-----------+----------+--------------+ CFV      Full           Yes      Yes                                 +---------+---------------+---------+-----------+----------+--------------+ SFJ      Full                                                        +---------+---------------+---------+-----------+----------+--------------+  FV Prox  Full                                                        +---------+---------------+---------+-----------+----------+--------------+ FV Mid   Full           Yes      Yes                                 +---------+---------------+---------+-----------+----------+--------------+ FV DistalFull                                                        +---------+---------------+---------+-----------+----------+--------------+ PFV      Full                                                        +---------+---------------+---------+-----------+----------+--------------+ POP      Full           Yes      Yes                                 +---------+---------------+---------+-----------+----------+--------------+ PTV      Full                                                        +---------+---------------+---------+-----------+----------+--------------+ PERO     Full                                                        +---------+---------------+---------+-----------+----------+--------------+     Summary: BILATERAL: - No evidence of deep vein thrombosis seen in the lower extremities, bilaterally. -No evidence of popliteal cyst, bilaterally.   *See table(s) above for measurements and observations. Electronically signed by Debby Robertson on 09/20/2024 at 4:08:39 PM.    Final    ECHOCARDIOGRAM COMPLETE Result Date: 09/20/2024    ECHOCARDIOGRAM REPORT   Patient Name:   [name] [name] Date of Exam: 09/20/2024 Medical Rec #:   [id number]       Height:       68.0 in Accession #:    [id number]      Weight:       202.6 lb Date of Birth:  1966/09/11      BSA:          2.055 m Patient Age:    57 years        BP:           115/61 mmHg Patient Gender: F               HR:  89 bpm. Exam Location:  Inpatient Procedure: 2D Echo and Intracardiac Opacification Agent (Both Spectral and Color            Flow Doppler were utilized during procedure). Indications:    CHF  History:        Patient has prior history of Echocardiogram examinations. CHF                 and Cardiomyopathy.  Sonographer:    Norleen Amour Referring Phys: [id number] SUBRINA SUNDIL IMPRESSIONS  1. Left ventricular ejection fraction, by estimation, is 25 to 30%. The left ventricle has severely decreased function. The left ventricle demonstrates regional wall motion abnormalities (see scoring diagram/findings for description). The left ventricular internal cavity size was mildly to moderately dilated. Left ventricular diastolic parameters are consistent with Grade I diastolic dysfunction (impaired relaxation).  2. Right ventricular systolic function is normal. The right ventricular size is normal. Tricuspid regurgitation signal is inadequate for assessing PA pressure.  3. Left atrial size was mildly dilated.  4. Right atrial size was mildly dilated.  5. Moderate pericardial effusion. The pericardial effusion is circumferential. There is no evidence of cardiac tamponade.  6. The mitral valve is abnormal. Mild mitral valve regurgitation.  7. The aortic valve is abnormal. Aortic valve regurgitation is not visualized. Aortic valve sclerosis/calcification is present, without any evidence of aortic stenosis.  8. The inferior vena cava is normal in size with greater than 50% respiratory variability, suggesting right atrial pressure of 3 mmHg. Comparison(s): A prior study was performed on 05/15/2024. Prior images reviewed side by side. Left ventricular ejection fraction was 40-45% with  akinesis of the distal anteroseptal wall and apex which appear more pronounced on today's study. There was no  pericardial effusion or mitral regurgitation noted at that time. Conclusion(s)/Recommendation(s): Critical findings reported to Georganna Archer. FINDINGS  Left Ventricle: Akinesis of the apex and periapical segments with severe hypokinesis of remaining segments. No thrombus noted. Left ventricular ejection fraction, by estimation, is 25 to 30%. The left ventricle has severely decreased function. The left ventricle demonstrates regional wall motion abnormalities. Definity  contrast agent was given IV to delineate the left ventricular endocardial borders. The left ventricular internal cavity size was mildly to moderately dilated. There is no left ventricular hypertrophy. Left ventricular diastolic parameters are consistent with Grade I diastolic dysfunction (impaired relaxation). Right Ventricle: The right ventricular size is normal. Right vetricular wall thickness was not assessed. Right ventricular systolic function is normal. Tricuspid regurgitation signal is inadequate for assessing PA pressure. Left Atrium: Left atrial size was mildly dilated. Right Atrium: Right atrial size was mildly dilated. Pericardium: A moderately sized pericardial effusion is present. The pericardial effusion is circumferential. The pericardial effusion appears to contain coagulum posteriorly. There is no evidence of cardiac tamponade. Mitral Valve: The mitral valve is abnormal. There is moderate thickening of the mitral valve leaflet(s). Mild mitral valve regurgitation. Tricuspid Valve: The tricuspid valve is normal in structure. Tricuspid valve regurgitation is mild. Aortic Valve: The aortic valve is abnormal. Aortic valve regurgitation is not visualized. Aortic valve sclerosis/calcification is present, without any evidence of aortic stenosis. Aortic valve mean gradient measures 7.8 mmHg. Aortic valve peak gradient measures 15.4  mmHg. Aortic valve area, by VTI measures 1.27 cm. Pulmonic Valve: The pulmonic valve was normal in structure. Pulmonic valve regurgitation is trivial. Aorta: The aortic root and ascending aorta are structurally normal, with no evidence of dilitation. Venous: The inferior vena cava is normal in size with greater than 50% respiratory  variability, suggesting right atrial pressure of 3 mmHg. IAS/Shunts: The interatrial septum was not well visualized. Additional Comments: There is a small pleural effusion.  LEFT VENTRICLE PLAX 2D LVIDd:         6.50 cm      Diastology LVIDs:         4.40 cm      LV e' medial:    7.62 cm/s LV PW:         0.90 cm      LV E/e' medial:  17.1 LV IVS:        0.90 cm      LV e' lateral:   8.27 cm/s LVOT diam:     2.00 cm      LV E/e' lateral: 15.7 LV SV:         46 LV SV Index:   22 LVOT Area:     3.14 cm  LV Volumes (MOD) LV vol d, MOD A2C: 210.0 ml LV vol d, MOD A4C: 186.0 ml LV vol s, MOD A2C: 148.0 ml LV vol s, MOD A4C: 137.0 ml LV SV MOD A2C:     62.0 ml LV SV MOD A4C:     186.0 ml LV SV MOD BP:      55.6 ml RIGHT VENTRICLE             IVC RV Basal diam:  3.00 cm     IVC diam: 1.80 cm RV S prime:     12.80 cm/s TAPSE (M-mode): 2.0 cm LEFT ATRIUM             Index        RIGHT ATRIUM           Index LA diam:        3.90 cm 1.90 cm/m   RA Area:     16.30 cm LA Vol (A2C):   57.6 ml 28.03 ml/m  RA Volume:   45.20 ml  22.00 ml/m LA Vol (A4C):   53.4 ml 25.99 ml/m LA Biplane Vol: 55.8 ml 27.15 ml/m  AORTIC VALVE                     PULMONIC VALVE AV Area (Vmax):    1.39 cm      PV Vmax:       0.91 m/s AV Area (Vmean):   1.33 cm      PV Peak grad:  3.3 mmHg AV Area (VTI):     1.27 cm AV Vmax:           196.25 cm/s AV Vmean:          126.250 cm/s AV VTI:            0.363 m AV Peak Grad:      15.4 mmHg AV Mean Grad:      7.8 mmHg LVOT Vmax:         87.00 cm/s LVOT Vmean:        53.500 cm/s LVOT VTI:          0.147 m LVOT/AV VTI ratio: 0.40  AORTA Ao Root diam: 2.40 cm Ao Asc diam:  3.10  cm MITRAL VALVE MV Area (PHT): 4.57 cm     SHUNTS MV Decel Time: 166 msec     Systemic VTI:  0.15 m MV E velocity: 130.00 cm/s  Systemic Diam: 2.00 cm MV A velocity: 105.00 cm/s MV E/A ratio:  1.24 Emeline Calender Electronically signed by Emeline Calender Signature  Date/Time: 09/20/2024/2:01:37 PM    Final    CT Angio Chest PE W and/or Wo Contrast Result Date: 09/19/2024 CLINICAL DATA:  Positive D-dimer. Chest pain. Concern for pulmonary disease. EXAM: CT ANGIOGRAPHY CHEST WITH CONTRAST TECHNIQUE: Multidetector CT imaging of the chest was performed using the standard protocol during bolus administration of intravenous contrast. Multiplanar CT image reconstructions and MIPs were obtained to evaluate the vascular anatomy. RADIATION DOSE REDUCTION: This exam was performed according to the departmental dose-optimization program which includes automated exposure control, adjustment of the mA and/or kV according to patient size and/or use of iterative reconstruction technique. CONTRAST:  75mL OMNIPAQUE  IOHEXOL  350 MG/ML SOLN COMPARISON:  Chest radiograph dated 09/19/2024. FINDINGS: Cardiovascular: There is no cardiomegaly. Small pericardial effusion measures approximately 7 mm in thickness. Three vessel coronary vascular calcification. Mild atherosclerotic calcification of the thoracic aorta. No aneurysmal dilatation. No pulmonary artery embolus identified. Mediastinum/Nodes: No hilar or mediastinal adenopathy. The esophagus and the thyroid  gland are grossly unremarkable. No mediastinal fluid collection. Lungs/Pleura: Small bilateral pleural effusions. There is partial compressive atelectasis of the lower lobes versus pneumonia. Patchy areas of airspace density in the upper lobes may represent edema, pneumonia, or combination. No pneumothorax. The central airways are patent. Upper Abdomen: No acute abnormality. Musculoskeletal: No acute osseous pathology. Mild degenerative changes. Review of the MIP images confirms the above  findings. IMPRESSION: 1. No CT evidence of pulmonary artery embolus. 2. Small bilateral pleural effusions with partial compressive atelectasis of the lower lobes versus pneumonia. 3. Patchy areas of airspace density in the upper lobes may represent edema, pneumonia, or combination. 4.  Aortic Atherosclerosis (ICD10-I70.0). Electronically Signed   By: Vanetta Chou M.D.   On: 09/19/2024 21:11   DG Chest 2 View Result Date: 09/19/2024 CLINICAL DATA:  Chest pain. EXAM: CHEST - 2 VIEW COMPARISON:  Chest radiograph dated 09/14/2024. FINDINGS: There is mild cardiomegaly with vascular congestion and mild edema. Small bilateral pleural effusions and bibasilar atelectasis or infiltrate. No pneumothorax. No acute osseous pathology. IMPRESSION: Mild cardiomegaly with findings of CHF and small bilateral pleural effusions. Electronically Signed   By: Vanetta Chou M.D.   On: 09/19/2024 17:23   DG Ribs Unilateral W/Chest Right Result Date: 09/14/2024 CLINICAL DATA:  Pain EXAM: RIGHT RIBS AND CHEST - 3+ VIEW COMPARISON:  Chest x-ray 01/12/2009 FINDINGS: The heart is enlarged. There central pulmonary vascular congestion. There are mild patchy central airspace opacities bilaterally. There is no pleural effusion or pneumothorax. The cardiomediastinal silhouette is within normal limits. No acute fractures are seen. Specifically, no right-sided rib fractures are seen. IMPRESSION: 1. Cardiomegaly with central pulmonary vascular congestion and mild patchy central airspace opacities bilaterally. Findings may represent pulmonary edema or atypical infection. 2. No acute fractures are seen. Electronically Signed   By: Greig Pique M.D.   On: 09/14/2024 20:18    Microbiology: Results for orders placed or performed during the hospital encounter of 05/14/24  Resp panel by RT-PCR (RSV, Flu A&B, Covid) Anterior Nasal Swab     Status: None   Collection Time: 05/14/24  8:18 PM   Specimen: Anterior Nasal Swab  Result Value Ref  Range Status   SARS Coronavirus 2 by RT PCR NEGATIVE NEGATIVE Final    Comment: (NOTE) SARS-CoV-2 target nucleic acids are NOT DETECTED.  The SARS-CoV-2 RNA is generally detectable in upper respiratory specimens during the acute phase of infection. The lowest concentration of SARS-CoV-2 viral copies this assay can detect is 138 copies/mL. A negative result does not preclude SARS-Cov-2 infection  and should not be used as the sole basis for treatment or other patient management decisions. A negative result may occur with  improper specimen collection/handling, submission of specimen other than nasopharyngeal swab, presence of viral mutation(s) within the areas targeted by this assay, and inadequate number of viral copies(<138 copies/mL). A negative result must be combined with clinical observations, patient history, and epidemiological information. The expected result is Negative.  Fact Sheet for Patients:  BloggerCourse.com  Fact Sheet for Healthcare Providers:  SeriousBroker.it  This test is no t yet approved or cleared by the [place]  FDA and  has been authorized for detection and/or diagnosis of SARS-CoV-2 by FDA under an Emergency Use Authorization (EUA). This EUA will remain  in effect (meaning this test can be used) for the duration of the COVID-19 declaration under Section 564(b)(1) of the Act, 21 U.S.C.section 360bbb-3(b)(1), unless the authorization is terminated  or revoked sooner.       Influenza A by PCR NEGATIVE NEGATIVE Final   Influenza B by PCR NEGATIVE NEGATIVE Final    Comment: (NOTE) The Xpert Xpress SARS-CoV-2/FLU/RSV plus assay is intended as an aid in the diagnosis of influenza from Nasopharyngeal swab specimens and should not be used as a sole basis for treatment. Nasal washings and aspirates are unacceptable for Xpert Xpress SARS-CoV-2/FLU/RSV testing.  Fact Sheet for  Patients: BloggerCourse.com  Fact Sheet for Healthcare Providers: SeriousBroker.it  This test is not yet approved or cleared by the [place]  FDA and has been authorized for detection and/or diagnosis of SARS-CoV-2 by FDA under an Emergency Use Authorization (EUA). This EUA will remain in effect (meaning this test can be used) for the duration of the COVID-19 declaration under Section 564(b)(1) of the Act, 21 U.S.C. section 360bbb-3(b)(1), unless the authorization is terminated or revoked.     Resp Syncytial Virus by PCR NEGATIVE NEGATIVE Final    Comment: (NOTE) Fact Sheet for Patients: BloggerCourse.com  Fact Sheet for Healthcare Providers: SeriousBroker.it  This test is not yet approved or cleared by the [place]  FDA and has been authorized for detection and/or diagnosis of SARS-CoV-2 by FDA under an Emergency Use Authorization (EUA). This EUA will remain in effect (meaning this test can be used) for the duration of the COVID-19 declaration under Section 564(b)(1) of the Act, 21 U.S.C. section 360bbb-3(b)(1), unless the authorization is terminated or revoked.  Performed at [facility], [address], [place], [place] [address]     Labs: CBC: Recent Labs  Lab 09/19/24 1654 09/20/24 0547 09/21/24 0318 09/21/24 1321 09/21/24 1323 09/21/24 1325  WBC 11.5* 6.4 6.7  --   --   --   HGB 12.3 11.2* 12.0 12.6 12.6 12.6  HCT 41.6 37.5 39.5 37.0 37.0 37.0  MCV 86.7 85.6 83.7  --   --   --   PLT 339 247 271  --   --   --    Basic Metabolic Panel: Recent Labs  Lab 09/19/24 1654 09/20/24 0547 09/20/24 1330 09/21/24 0318 09/21/24 1321 09/21/24 1323 09/21/24 1325 09/22/24 0231  NA 136 138 137 137 139 140 140 137  K 3.8 3.5 3.5 3.8 3.5 3.4* 3.4* 3.1*  CL 102 103 99 100  --   --   --  101  CO2 22 23 25 25   --   --   --  26  GLUCOSE 161*  161* 164* 172*  --   --   --  165*  BUN 5* 7 9 12   --   --   --  10  CREATININE 0.63 0.63 0.69 0.59  --   --   --  0.66  CALCIUM  9.0 8.6* 8.8* 8.8*  --   --   --  8.7*  MG  --   --  1.6* 1.7  --   --   --  1.5*   Liver Function Tests: Recent Labs  Lab 09/20/24 0547  AST 20  ALT 16  ALKPHOS 64  BILITOT 0.8  PROT 6.5  ALBUMIN 3.2*   CBG: Recent Labs  Lab 09/21/24 1128 09/21/24 1528 09/21/24 2159 09/22/24 0625 09/22/24 1159  GLUCAP 121* 160* 153* 155* 127*    Discharge time spent: greater than 30 minutes.  Signed: Elidia Toribio Furnace, MD [facility] 09/22/2024

## 2024-09-22 NOTE — Progress Notes (Addendum)
 Pt has been educated on HF, she has received book. Pt is not interested in receiving Pre-Op education at this time. Pt received [facility] booklet. Will not refer to CR as she is not appropriate at this time.   Garen FORBES Candy MS, ACSM-CEP 09/22/2024 11:10 AM

## 2024-09-22 NOTE — Assessment & Plan Note (Signed)
 Hypomagnesemia.   Renal function remained stable, patient received Kcl and Mg sulfate for diuresis induced electrolyte disturbances.    At the time of discharge her renal function has a serum cr of 0,66 with K at 3.1 and serum bicarbonate at 26  Na 137 and Mg 1.5   Before discharge she will receive 80 meq Kcl and 4 g mag sulfate.  Continue furosemide, SGLT 2 inh, and spironolactone  for diuresis.  K supplementation and follow up renal function and electrolytes as outpatient.

## 2024-09-23 ENCOUNTER — Telehealth: Payer: Self-pay | Admitting: *Deleted

## 2024-09-23 NOTE — Transitions of Care (Post Inpatient/ED Visit) (Signed)
 09/23/2024  Name: [name] [name] MRN: [id number] DOB: 08-07-1966  Today's TOC FU Call Status: Today's TOC FU Call Status:: Successful TOC FU Call Completed TOC FU Call Complete Date: 09/23/24 Patient's Name and Date of Birth confirmed.  Transition Care Management Follow-up Telephone Call Date of Discharge: 09/22/24 Discharge Facility: [facility]) Type of Discharge: Inpatient Admission Primary Inpatient Discharge Diagnosis:: Acute on chronic CHF; diagnostic cardiac cath: multivessel CAD How have you been since you were released from the hospital?: Better (I am doing fine; taking care of myself, back to normal activities- just moving a little slower than usual.  Planning to go back to work on Monday.  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- I know to call the doctors if I need something) Any questions or concerns?: No  Items Reviewed: Did you receive and understand the discharge instructions provided?: Yes (thoroughly reviewed with patient who verbalizes good understanding of same) Medications obtained,verified, and reconciled?: No (Reports waiting insurance approval for Entresto: practively verbalizes plans to discuss with CHF provider 09/27/24; confirms self-manages medications and denies questions/ concerns around medications today) Medications Not Reviewed Reasons:: Other: (Patient decined: I already went over them and have talked to [employer]- they are working on the [employer] issue and I know it will all work out, I am not worried about it at this point) Any new allergies since your discharge?: No Dietary orders reviewed?: Yes Type of Diet Ordered:: Pretty much regular diet Do you have support at home?: Yes People in Home [RPT]: spouse Name of Support/Comfort Primary Source: Reports independent in self-care activities;  resides with supportive spouse and son- assists as/ if needed/ indicated  Medications Reviewed Today: Medications Reviewed  Today     Reviewed by Bayler Nehring M, RN (Registered Nurse) on 09/23/24 at 1221  Med List Status: <None>   Medication Order Taking? Sig Documenting Provider Last Dose Status Informant  Albuterol -Budesonide (AIRSUPRA ) 90-80 MCG/ACT AERO [id number] No Inhale 2 Inhalations into the lungs 4 (four) times daily as needed. [name], Aleksei V, MD 09/19/2024 Morning Active Self, Pharmacy Records  ALPRAZolam  (XANAX ) 0.5 MG tablet [id number] No TAKE 1 TABLET(0.5 MG) BY MOUTH TWICE DAILY  Patient taking differently: Take 0.5 mg by mouth daily as needed for anxiety.   [name], Aleksei V, MD Past Week Active Self, Pharmacy Records  aspirin  81 MG chewable tablet [id number] No Chew 1 tablet (81 mg total) by mouth daily. [name], Karlynn GAILS, MD 09/19/2024 Morning Active Self, Pharmacy Records  atorvastatin  (LIPITOR) 80 MG tablet [id number]  Take 1 tablet (80 mg total) by mouth daily. Arrien, Mauricio Daniel, MD  Active   buPROPion  (WELLBUTRIN  SR) 150 MG 12 hr tablet [id number] No TAKE 1 TABLET BY MOUTH TWICE DAILY [name], Aleksei V, MD 09/19/2024 Morning Active Self, Pharmacy Records  Cholecalciferol  (EQL VITAMIN D3) 1000 units tablet [id number] No Take 1 tablet (1,000 Units total) by mouth daily. [name], Aleksei V, MD 09/19/2024 Morning Active Self, Pharmacy Records  citalopram  (CELEXA ) 40 MG tablet [id number] No Take 1 tablet (40 mg total) by mouth daily. [name], Karlynn GAILS, MD 09/18/2024 Evening Active Self, Pharmacy Records  furosemide (LASIX) 40 MG tablet [id number]  Take 1 tablet (40 mg total) by mouth daily. Arrien, Elidia Sieving, MD  Active   gabapentin  (NEURONTIN ) 100 MG capsule [id number] No TAKE 1 CAPSULE(100 MG) BY MOUTH THREE TIMES DAILY [name], Aleksei V, MD 09/18/2024 Evening Active Self, Pharmacy Records  GLYXAMBI  25-5 MG TABS [id number] No TAKE 1  TABLET BY MOUTH DAILY [name], Aleksei V, MD 09/19/2024 Morning Active Self, Pharmacy Records  metFORMIN  (GLUCOPHAGE ) 500 MG tablet [id number] No TAKE 2  TABLETS TWICE DAILY WITH FOOD [name], Aleksei V, MD 09/19/2024 Morning Active Self, Pharmacy Records  metoprolol  succinate (TOPROL -XL) 50 MG 24 hr tablet [id number]  Take 1 tablet (50 mg total) by mouth daily. Take with or immediately following a meal. Arrien, Elidia Sieving, MD  Active   nitroGLYCERIN  (NITROSTAT ) 0.4 MG SL tablet [id number]  Place 1 tablet (0.4 mg total) under the tongue every 5 (five) minutes as needed for chest pain. Arrien, Elidia Sieving, MD  Active   pantoprazole  (PROTONIX ) 40 MG tablet [id number] No TAKE 1 TABLET(40 MG) BY MOUTH TWICE DAILY [name], Aleksei V, MD 09/19/2024 Morning Active Self, Pharmacy Records  potassium chloride  SA (KLOR-CON  M) 20 MEQ tablet [id number]  Take 1 tablet (20 mEq total) by mouth daily. Arrien, Elidia Sieving, MD  Active   repaglinide  (PRANDIN ) 2 MG tablet [id number] No TAKE 1 TABLET BY MOUTH THREE TIMES DAILY BEFORE MEALS [name], Aleksei V, MD 09/19/2024 Morning Active Self, Pharmacy Records  sacubitril-valsartan (ENTRESTO) 24-26 MG [id number]  Take 1 tablet by mouth 2 (two) times daily. Arrien, Elidia Sieving, MD  Active   Semaglutide , 2 MG/DOSE, 8 MG/3ML SOPN [id number] No 2 mg sq weekly  Patient taking differently: Inject 1 mg into the skin every Sunday.   [name], Aleksei V, MD 09/18/2024 Morning Active Self, Pharmacy Records           Med Note (WHITE, DONETA GORMAN Kitchens Sep 19, 2024 11:35 PM) Haven't started the 2mg  dosing yet  spironolactone  (ALDACTONE ) 25 MG tablet [id number] No Take 1 tablet (25 mg total) by mouth daily. [name], Karlynn GAILS, MD 09/19/2024 Morning Active Self, Pharmacy Records           Home Care and Equipment/Supplies: Were Home Health Services Ordered?: No Any new equipment or medical supplies ordered?: No  Functional Questionnaire: Do you need assistance with bathing/showering or dressing?: No Do you need assistance with meal preparation?: No Do you need assistance with eating?: No Do you have difficulty  maintaining continence: No Do you need assistance with getting out of bed/getting out of a chair/moving?: No Do you have difficulty managing or taking your medications?: No  Follow up appointments reviewed: PCP Follow-up appointment confirmed?: Yes Date of PCP follow-up appointment?: 10/03/24 Follow-up Provider: PCP- Dr. Burke Specialist [facility] Follow-up appointment confirmed?: Yes Date of Specialist follow-up appointment?: 09/27/24 Follow-Up Specialty Provider:: CHF- cardiology- provider: new patient Do you need transportation to your follow-up appointment?: No Do you understand care options if your condition(s) worsen?: Yes-patient verbalized understanding  SDOH Interventions Today    Flowsheet Row Most Recent Value  SDOH Interventions   Food Insecurity Interventions Intervention Not Indicated  Housing Interventions Intervention Not Indicated  Transportation Interventions Intervention Not Indicated  [drives self]  Utilities Interventions Intervention Not Indicated   See TOC assessment tabs for additional assessment/ TOC intervention information  Patient adamantly declines need for ongoing/ further care management/ coordination outreach; declines enrollment in 30-day TOC program- declines taking my direct phone number should needs/ concerns arise post-TOC call   Pls call/ message for questions,  Allis Quirarte Mckinney Stirling Orton, RN, BSN, CCRN Alumnus RN Care Manager  Transitions of Care  [facility] - Population Health  [place] [phone]: direct office

## 2024-09-23 NOTE — Telephone Encounter (Signed)
 Pharmacy Patient Advocate Encounter  Received notification from [facility] that Prior Authorization for Sacubitril-Valsartan 24-26MG  tablets   has been CANCELLED due to   Tried calling and they say patient does not have prescription coverage.

## 2024-09-27 ENCOUNTER — Ambulatory Visit (HOSPITAL_COMMUNITY): Payer: Self-pay | Admitting: Physician Assistant

## 2024-09-27 ENCOUNTER — Telehealth (HOSPITAL_COMMUNITY): Payer: Self-pay

## 2024-09-27 ENCOUNTER — Encounter (HOSPITAL_COMMUNITY): Payer: Self-pay

## 2024-09-27 ENCOUNTER — Encounter: Payer: Self-pay | Admitting: Thoracic Surgery (Cardiothoracic Vascular Surgery)

## 2024-09-27 ENCOUNTER — Ambulatory Visit (INDEPENDENT_AMBULATORY_CARE_PROVIDER_SITE_OTHER): Admitting: Thoracic Surgery (Cardiothoracic Vascular Surgery)

## 2024-09-27 ENCOUNTER — Ambulatory Visit
Admit: 2024-09-27 | Discharge: 2024-09-27 | Disposition: A | Discharge status: Home / Self Care | Source: Ambulatory Visit | Attending: Physician Assistant | Admitting: Physician Assistant

## 2024-09-27 ENCOUNTER — Encounter: Payer: Self-pay | Admitting: Oncology

## 2024-09-27 ENCOUNTER — Other Ambulatory Visit (HOSPITAL_COMMUNITY): Payer: Self-pay

## 2024-09-27 VITALS — BP 117/64 | HR 103 | Resp 20 | Ht 68.0 in | Wt 193.5 lb

## 2024-09-27 VITALS — BP 120/58 | HR 103 | Ht 68.0 in | Wt 192.2 lb

## 2024-09-27 DIAGNOSIS — I2510 Atherosclerotic heart disease of native coronary artery without angina pectoris: Secondary | ICD-10-CM | POA: Diagnosis not present

## 2024-09-27 DIAGNOSIS — Z79899 Other long term (current) drug therapy: Secondary | ICD-10-CM | POA: Diagnosis not present

## 2024-09-27 DIAGNOSIS — Z7984 Long term (current) use of oral hypoglycemic drugs: Secondary | ICD-10-CM | POA: Insufficient documentation

## 2024-09-27 DIAGNOSIS — K219 Gastro-esophageal reflux disease without esophagitis: Secondary | ICD-10-CM | POA: Insufficient documentation

## 2024-09-27 DIAGNOSIS — I2583 Coronary atherosclerosis due to lipid rich plaque: Secondary | ICD-10-CM | POA: Diagnosis not present

## 2024-09-27 DIAGNOSIS — Z7982 Long term (current) use of aspirin: Secondary | ICD-10-CM | POA: Diagnosis not present

## 2024-09-27 DIAGNOSIS — F411 Generalized anxiety disorder: Secondary | ICD-10-CM | POA: Diagnosis not present

## 2024-09-27 DIAGNOSIS — Z720 Tobacco use: Secondary | ICD-10-CM | POA: Diagnosis not present

## 2024-09-27 DIAGNOSIS — E119 Type 2 diabetes mellitus without complications: Secondary | ICD-10-CM | POA: Insufficient documentation

## 2024-09-27 DIAGNOSIS — E785 Hyperlipidemia, unspecified: Secondary | ICD-10-CM | POA: Diagnosis not present

## 2024-09-27 DIAGNOSIS — I5020 Unspecified systolic (congestive) heart failure: Secondary | ICD-10-CM | POA: Diagnosis not present

## 2024-09-27 DIAGNOSIS — I255 Ischemic cardiomyopathy: Secondary | ICD-10-CM | POA: Diagnosis not present

## 2024-09-27 DIAGNOSIS — I471 Supraventricular tachycardia, unspecified: Secondary | ICD-10-CM | POA: Diagnosis present

## 2024-09-27 DIAGNOSIS — E1165 Type 2 diabetes mellitus with hyperglycemia: Secondary | ICD-10-CM | POA: Diagnosis not present

## 2024-09-27 DIAGNOSIS — D509 Iron deficiency anemia, unspecified: Secondary | ICD-10-CM | POA: Insufficient documentation

## 2024-09-27 DIAGNOSIS — I5022 Chronic systolic (congestive) heart failure: Secondary | ICD-10-CM | POA: Diagnosis not present

## 2024-09-27 DIAGNOSIS — O903 Peripartum cardiomyopathy: Secondary | ICD-10-CM | POA: Diagnosis present

## 2024-09-27 DIAGNOSIS — Z87891 Personal history of nicotine dependence: Secondary | ICD-10-CM | POA: Diagnosis not present

## 2024-09-27 LAB — BASIC METABOLIC PANEL WITH GFR
Anion gap: 13 (ref 5–15)
BUN: 12 mg/dL (ref 6–20)
CO2: 24 mmol/L (ref 22–32)
Calcium: 9.2 mg/dL (ref 8.9–10.3)
Chloride: 98 mmol/L (ref 98–111)
Creatinine, Ser: 0.72 mg/dL (ref 0.44–1.00)
GFR, Estimated: >60 mL/min (ref >60)
Glucose, Bld: 128 mg/dL — ABNORMAL HIGH (ref 70–99)
Potassium: 3.8 mmol/L (ref 3.5–5.1)
Sodium: 135 mmol/L (ref 135–145)

## 2024-09-27 LAB — BRAIN NATRIURETIC PEPTIDE: B Natriuretic Peptide: 319.7 pg/mL — ABNORMAL HIGH (ref 0.0–100.0)

## 2024-09-27 MED ORDER — SPIRONOLACTONE 25 MG PO TABS: 25.0000 mg | ORAL_TABLET | Freq: Every day | ORAL | 3 refills | Status: AC

## 2024-09-27 MED ORDER — METOPROLOL SUCCINATE ER 50 MG PO TB24: 75.0000 mg | ORAL_TABLET | Freq: Every day | ORAL | 3 refills | Status: DC

## 2024-09-27 NOTE — Telephone Encounter (Signed)
 Advanced Heart Failure Patient Advocate Encounter  Entresto requires prior authorization and I am unable to submit electronically at this time. Will contact insurance to attempt to submit prior auth via phone, or to resolve the issue with electronic submission.  Rachel DEL, CPhT Rx Patient Advocate Phone: [phone]

## 2024-09-27 NOTE — H&P (View-Only) (Signed)
 PCP is [name], Karlynn GAILS, MD Referring Provider is Ladona Heinz, MD  Chief Complaint  Patient presents with   Coronary Artery Disease    New patient consult, CATH 10/1, ECHO 9/30    HPI: Ms.  Rabe returns to discuss management of her coronary disease and ischemic cardiomyopathy.  [name] [name] is a 58 year old woman with a history significant for type 2 diabetes, hyperlipidemia peripartum cardiomyopathy, DVT, SVT status post ablation, reflux, hepatitis, and recently diagnosed two-vessel CAD, ischemic cardiomyopathy, and acute on chronic left ventricular systolic failure.  She choked on some food and required Heimlich maneuver by her husband.  She had a rib fracture.  After that she noted worsening chest pain and shortness of breath.  The pain became pressure that she described as an elephant sitting on her chest.  She developed bilateral peripheral edema.  She had mildly elevated troponins.  She had an echocardiogram which showed her ejection fraction was significantly reduced at 25 to 30% with no significant valvular pathology.  Right ventricular function was normal.  There was a moderate pericardial effusion.  She underwent cardiac catheterization which showed severe two-vessel disease involving the LAD large diagonal branch and the right coronary artery.  I saw her in the hospital.  She was not ready for surgery at that time.  The plan was for her to go home and have medical management and then do an interval surgery once she was in a better shape.  She saw [name] in the heart failure team earlier today.  Overall doing well but still volume overloaded and her diuretics were increased.  Since discharge she has had couple of twinges of chest pain but no severe episodes or anything that caused her concern.   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   Hydradenitis    right   Neuromuscular disorder (HCC)     Past Surgical History:  Procedure Laterality Date   CESAREAN SECTION     x 2   CHOLECYSTECTOMY  2006   CORONARY PRESSURE/FFR WITH 3D MAPPING N/A 09/21/2024   Procedure: Coronary Pressure/FFR w/3D Mapping;  Surgeon: Ladona Heinz, MD;  Location: [facility];  Service: Cardiovascular;  Laterality: N/A;   RIGHT/LEFT HEART CATH AND CORONARY ANGIOGRAPHY N/A 09/21/2024   Procedure: RIGHT/LEFT HEART CATH AND CORONARY ANGIOGRAPHY;  Surgeon: Ladona Heinz, MD;  Location: [facility];  Service: Cardiovascular;  Laterality: N/A;   SVT ABLATION N/A 07/20/2024   Procedure: SVT ABLATION;  Surgeon: Waddell Danelle ORN, MD;  Location: [facility];  Service: Cardiovascular;  Laterality: N/A;    Family History  Problem Relation Age of Onset   Diabetes Mother    Hypertension Mother    Inflammatory bowel disease Maternal Aunt    Colon cancer Paternal Aunt    Colon polyps Maternal Grandmother    Hypertension Other    Liver cancer Neg Hx    Pancreatic cancer Neg Hx    Rectal cancer Neg Hx    Stomach cancer Neg Hx    Esophageal cancer Neg Hx     Social History Social History   Tobacco Use   Smoking status: Former    Current packs/day:  0.50    Average packs/day: 0.5 packs/day for 15.0 years (7.5 ttl pk-yrs)    Types: Cigarettes   Smokeless tobacco: Never   Tobacco comments:    info given 12/14/2019  Vaping Use   Vaping status: Never Used  Substance Use Topics   Alcohol use: Not Currently   Drug use: No    Current Outpatient Medications  Medication Sig Dispense Refill   Albuterol -Budesonide (AIRSUPRA ) 90-80 MCG/ACT AERO Inhale 2 Inhalations into the lungs 4 (four) times daily as needed. 10 g 5   ALPRAZolam  (XANAX ) 0.5 MG tablet TAKE 1 TABLET(0.5 MG) BY MOUTH TWICE DAILY (Patient taking differently: Take 0.5 mg by mouth  daily as needed for anxiety.) 180 tablet 0   aspirin  81 MG chewable tablet Chew 1 tablet (81 mg total) by mouth daily. 90 tablet 1   atorvastatin  (LIPITOR) 80 MG tablet Take 1 tablet (80 mg total) by mouth daily. 30 tablet 0   buPROPion  (WELLBUTRIN  SR) 150 MG 12 hr tablet TAKE 1 TABLET BY MOUTH TWICE DAILY 180 tablet 1   Cholecalciferol  (EQL VITAMIN D3) 1000 units tablet Take 1 tablet (1,000 Units total) by mouth daily. 90 tablet 1   citalopram  (CELEXA ) 40 MG tablet Take 1 tablet (40 mg total) by mouth daily. 90 tablet 3   furosemide (LASIX) 40 MG tablet Take 1 tablet (40 mg total) by mouth daily. 30 tablet 0   gabapentin  (NEURONTIN ) 100 MG capsule TAKE 1 CAPSULE(100 MG) BY MOUTH THREE TIMES DAILY 90 capsule 5   GLYXAMBI  25-5 MG TABS TAKE 1 TABLET BY MOUTH DAILY 90 tablet 3   metFORMIN  (GLUCOPHAGE ) 500 MG tablet TAKE 2 TABLETS TWICE DAILY WITH FOOD 360 tablet 0   metoprolol  succinate (TOPROL -XL) 50 MG 24 hr tablet Take 1.5 tablets (75 mg total) by mouth daily. Take with or immediately following a meal. 45 tablet 3   nitroGLYCERIN  (NITROSTAT ) 0.4 MG SL tablet Place 1 tablet (0.4 mg total) under the tongue every 5 (five) minutes as needed for chest pain. 30 tablet 0   pantoprazole  (PROTONIX ) 40 MG tablet TAKE 1 TABLET(40 MG) BY MOUTH TWICE DAILY 180 tablet 3   potassium chloride  SA (KLOR-CON  M) 20 MEQ tablet Take 1 tablet (20 mEq total) by mouth daily. 30 tablet 0   repaglinide  (PRANDIN ) 2 MG tablet TAKE 1 TABLET BY MOUTH THREE TIMES DAILY BEFORE MEALS 270 tablet 3   sacubitril-valsartan (ENTRESTO) 24-26 MG Take 1 tablet by mouth 2 (two) times daily. 60 tablet 0   Semaglutide , 2 MG/DOSE, 8 MG/3ML SOPN 2 mg sq weekly (Patient taking differently: Inject 1 mg into the skin every Sunday.) 3 mL 5   spironolactone  (ALDACTONE ) 25 MG tablet Take 1 tablet (25 mg total) by mouth daily. 90 tablet 3   No current facility-administered medications for this visit.    Allergies  Allergen Reactions   Adenosine       SOB and pressure for 20 seconds    Review of Systems  Constitutional:  Negative for unexpected weight change.  Respiratory:  Positive for shortness of breath.   Cardiovascular:  Positive for chest pain and leg swelling.  All other systems reviewed and are negative.   BP 117/64 (BP Location: Right Arm, Patient Position: Sitting, Cuff Size: Normal)   Pulse (!) 103   Resp 20   Ht 5' 8 (1.727 m)   Wt 193 lb 8 oz (87.8 kg)   LMP 03/02/2017   SpO2 97% Comment: RA  BMI 29.42 kg/m  Physical Exam Vitals  reviewed.  Constitutional:      General: She is not in acute distress.    Appearance: Normal appearance.  HENT:     Head: Normocephalic and atraumatic.  Eyes:     General: No scleral icterus.    Extraocular Movements: Extraocular movements intact.  Neck:     Vascular: No carotid bruit.  Cardiovascular:     Rate and Rhythm: Normal rate and regular rhythm.     Heart sounds:     Gallop (? S4) present.     Comments: Normal Allen's test on left Pulmonary:     Effort: Pulmonary effort is normal. No respiratory distress.     Breath sounds: Normal breath sounds. No wheezing or rales.  Abdominal:     General: There is no distension.     Palpations: Abdomen is soft.  Musculoskeletal:     Cervical back: Neck supple.     Right lower leg: No edema.     Left lower leg: No edema.  Skin:    General: Skin is warm and dry.  Neurological:     General: No focal deficit present.     Mental Status: She is alert and oriented to person, place, and time.     Cranial Nerves: No cranial nerve deficit.     Motor: No weakness.    Diagnostic Tests: Echocardiogram 09/20/2024 IMPRESSIONS     1. Left ventricular ejection fraction, by estimation, is 25 to 30%. The  left ventricle has severely decreased function. The left ventricle  demonstrates regional wall motion abnormalities (see scoring  diagram/findings for description). The left  ventricular internal cavity size was mildly to  moderately dilated. Left  ventricular diastolic parameters are consistent with Grade I diastolic  dysfunction (impaired relaxation).   2. Right ventricular systolic function is normal. The right ventricular  size is normal. Tricuspid regurgitation signal is inadequate for assessing  PA pressure.   3. Left atrial size was mildly dilated.   4. Right atrial size was mildly dilated.   5. Moderate pericardial effusion. The pericardial effusion is  circumferential. There is no evidence of cardiac tamponade.   6. The mitral valve is abnormal. Mild mitral valve regurgitation.   7. The aortic valve is abnormal. Aortic valve regurgitation is not  visualized. Aortic valve sclerosis/calcification is present, without any  evidence of aortic stenosis.   8. The inferior vena cava is normal in size with greater than 50%  respiratory variability, suggesting right atrial pressure of 3 mmHg.   Comparison(s): A prior study was performed on 05/15/2024. Prior images  reviewed side by side. Left ventricular ejection fraction was 40-45% with  akinesis of the distal anteroseptal wall and apex which appear more  pronounced on today's study. There was no   pericardial effusion or mitral regurgitation noted at that time.    Cardiac Catheterization 09/21/24: Hemodynamic data: Moderate pulmonary hypertension with markedly elevated LVEDP consistent with WHO group 2 PAH.  RV function is preserved with PAPi 2.3. COP 6.12 andn CI 3.02 by Fick LVEDP 33 mmHg.  There is no pressure gradient across the aortic valve.   Angiographic data: LM: Calcified but widely patent. LAD: Calcified in the proximal segment.  There is moderate diffuse disease in the proximal segment.  There is a proximal 60% stenosis followed by 80% stenosis distal to the origin of D1 with FFR 0.77, indicating hemodynamically significant stenosis.  Large D1 with proximal 80% stenosis with FFR 0.67, hemodynamically significant.  Distal to apical LAD has 90%  focal  stenosis. LCx: Gives origin to large OM 2 and OM 3, mild diffuse disease. RCA: Large-caliber vessel, gives origin to large PDA and large PL branch.  Proximal segment has a 99% calcific stenosis.      Impression and recommendations: Patient's LAD and D1 and PDA and PL branches are good targets for CABG.  Once patient is optimized medically from heart failure, would benefit from consideration for surgical revascularization.  Patient was started on Farxiga today. Suspect can be discharged once stable with elective CABG.   I personally reviewed the catheterization images.  Severe two-vessel coronary disease involving LAD and RCA.  LAD, distal right, and large diagonal are all reasonable targets for bypass.  Impression: [name] [name] is a 58 year old woman with a history significant for type 2 diabetes, hyperlipidemia peripartum cardiomyopathy, DVT, SVT status post ablation, reflux, hepatitis, and recently diagnosed two-vessel CAD, ischemic cardiomyopathy, and acute on chronic left ventricular systolic failure.  Two-vessel coronary disease with severe left ventricular dysfunction-coronary bypass grafting is indicated for survival benefit and relief of symptoms.  She understands that this will not necessarily correct her left ventricular dysfunction but should make it easier to manage medically.  I informed her of the general nature of the proposed operation.  She understands the need for general anesthesia, the incisions to be used, the use of cardiopulmonary bypass, the use of temporary pacemaker wires and drainage tubes postoperatively, the expected hospital stay, and the overall recovery.  I informed her of the indications, risks, benefits, and alternatives.  She understands the risks include, but not limited to death, MI, DVT, PE, bleeding, possible need for transfusion, cardiac failure requiring mechanical support, infection, cardiac arrhythmias, respiratory or renal failure, as well as  possibility of other unforeseeable complications.  She has a normal Allen's test on the left.  Will check the left radial artery to see if it suitable for use as a bypass conduit.  If so we will plan to use it probably on the right coronary.  Dr. Rolan wants to do a repeat right heart cath.  Tentatively will plan to have her admitted and undergo right heart cath on 10/24/2024 and then CABG on Wednesday, 10/26/2024.  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  Plan: Coronary artery bypass grafting with left radial artery harvest.  Elspeth JAYSON Millers, MD [facility] [phone]

## 2024-09-27 NOTE — Patient Instructions (Addendum)
 Medication Changes:  RESTART Spironolactone  25mg  (1 tab) daily  INCREASE Metoprolol  75mg  (1.5 tab) daily  FOR 3 DAYS ONLY take Furosemide 40mg  two times daily AND potassium 40meq FOR 3 DAYS ONLY  Lab Work:  Labs done today, your results will be available in MyChart, we will contact you for abnormal readings.   Follow-Up in: Please follow up with the Advanced Heart Failure Clinic in 2 weeks with the Advanced Practice Provider and again in November with Dr. Rolan.   At the Advanced Heart Failure Clinic, you and your health needs are our priority. We have a designated team specialized in the treatment of Heart Failure. This Care Team includes your primary Heart Failure Specialized Cardiologist (physician), Advanced Practice Providers (APPs- Physician Assistants and Nurse Practitioners), and Pharmacist who all work together to provide you with the care you need, when you need it.   You may see any of the following providers on your designated Care Team at your next follow up:  Dr. Toribio Fuel Dr. Ezra Rolan Dr. Ria Commander Dr. Odis Brownie Greig Mosses, NP [place]affie Shed, [place] [facility], [place] Beckey Coe, NP [place] Lee, NP Tinnie Redman, PharmD   Please be sure to bring in all your medications bottles to every appointment.   Need to Contact Us :  If you have any questions or concerns before your next appointment please send us  a message through [place] or call our office at [phone].    TO LEAVE A MESSAGE FOR THE NURSE SELECT OPTION 2, PLEASE LEAVE A MESSAGE INCLUDING: YOUR NAME DATE OF BIRTH CALL BACK NUMBER REASON FOR CALL**this is important as we prioritize the call backs  YOU WILL RECEIVE A CALL BACK THE SAME DAY AS LONG AS YOU CALL BEFORE 4:00 PM

## 2024-09-27 NOTE — Progress Notes (Signed)
 ReDS Vest / Clip - 09/27/24 1453       ReDS Vest / Clip   Station Marker C    Ruler Value 31    ReDS Value Range Moderate volume overload    ReDS Actual Value 39

## 2024-09-27 NOTE — Progress Notes (Addendum)
 HEART & VASCULAR TRANSITION OF CARE CONSULT NOTE     Referring Physician: Dr. Kerrin PCP: Dr. Garald Cardiologist: Dr. Floretta  Chief Complaint: HFrEF  HPI: Referred to clinic by Dr. Kerrin with [facility] for heart failure consultation.   [name] [name] is a 58 y.o. female with history of atrial tachycardia status post ablation in July 2025, peripartum cardiomyopathy in 2005, hyperlipidemia, DM II, GERD, tobacco use, GAD., iron  deficiency anemia.  On review of old records note hx of admission for CHF in June 2005 after delivery of her first child the month prior. EF 25-30% at that time. No CAD on cath. EF had recovered on subsequent echo in June 2006.  Later had admission with SVT in 5/25. Echo at that time w/ LVEF 40-45%. Had SVT ablation in July 2025. Underwent coronary CTA which showed flow-limiting disease involving LAD, RCA and LCX by CT-FFR.  She was scheduled to undergo coronary angiography on October 2.    She presented 09/19/24 with worsening shortness of breath, orthopnea, PND and episodes of 2 different types of chest pain.  She was admitted with acute on chronic CHF.  Cardiology was consulted.  Echo w/ LVEF 25-30%, WMA, RV okay, moderate pericardial effusion. R/LHC 09/21/24 w/ severe 2V CAD, LVEDP 33, RA mean 11, PA 55/30 (39), PCWP mean 29, Fick CO/CI 6.12/3.02. She was diuresed and GDMT titrated. Seen by TCTS with plan for CABG once optimized. Referred for f/u in [facility] clinic for evaluation.   She is here today for post hospital CHF follow-up. Doing well from HF standpoint. Shortness of breath improved, still gets winded with exertion.  No longer having orthopnea and leg edema resolved. Home weight 191 >> 186lb. Up 1.5 lb from yesterday, 188 lb today. Notes occasional fleeting chest pain lasting seconds in duration. No exertional chest pain. Denies dizziness. Taking all meds except unable to get entresto filled d/t pending preauth and accidentally stopped  spironolactone  (misunderstood discharge instructions).    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 10 g 5   ALPRAZolam  (XANAX ) 0.5 MG tablet TAKE 1 TABLET(0.5 MG) BY MOUTH TWICE DAILY (Patient taking differently: Take 0.5 mg by mouth daily as needed for anxiety.) 180 tablet 0   aspirin  81 MG chewable tablet Chew 1 tablet (81 mg total) by mouth daily. 90 tablet 1   atorvastatin  (LIPITOR) 80 MG tablet Take 1 tablet (80 mg total) by mouth daily. 30 tablet 0   buPROPion  (WELLBUTRIN  SR) 150 MG 12 hr tablet TAKE 1 TABLET BY MOUTH TWICE DAILY 180 tablet 1   Cholecalciferol  (EQL VITAMIN D3) 1000 units tablet Take 1 tablet (1,000 Units total) by mouth daily. 90 tablet 1   citalopram  (CELEXA ) 40 MG tablet Take 1 tablet (40 mg total) by mouth daily. 90 tablet 3   furosemide (LASIX) 40 MG tablet Take 1 tablet (40 mg total) by mouth daily. 30 tablet 0   gabapentin  (NEURONTIN ) 100  MG capsule TAKE 1 CAPSULE(100 MG) BY MOUTH THREE TIMES DAILY 90 capsule 5   GLYXAMBI  25-5 MG TABS TAKE 1 TABLET BY MOUTH DAILY 90 tablet 3   metFORMIN  (GLUCOPHAGE ) 500 MG tablet TAKE 2 TABLETS TWICE DAILY WITH FOOD 360 tablet 0   metoprolol  succinate (TOPROL -XL) 50 MG 24 hr tablet Take 1 tablet (50 mg total) by mouth daily. Take with or immediately following a meal. 30 tablet 0   nitroGLYCERIN  (NITROSTAT ) 0.4 MG SL tablet Place 1 tablet (0.4 mg total) under the tongue every 5 (five) minutes as needed for chest pain. 30 tablet 0   pantoprazole  (PROTONIX ) 40 MG  tablet TAKE 1 TABLET(40 MG) BY MOUTH TWICE DAILY 180 tablet 3   potassium chloride  SA (KLOR-CON  M) 20 MEQ tablet Take 1 tablet (20 mEq total) by mouth daily. 30 tablet 0   repaglinide  (PRANDIN ) 2 MG tablet TAKE 1 TABLET BY MOUTH THREE TIMES DAILY BEFORE MEALS 270 tablet 3   Semaglutide , 2 MG/DOSE, 8 MG/3ML SOPN 2 mg sq weekly (Patient taking differently: Inject 1 mg into the skin every Sunday.) 3 mL 5   sacubitril-valsartan (ENTRESTO) 24-26 MG Take 1 tablet by mouth 2 (two) times daily. (Patient not taking: Reported on 09/27/2024) 60 tablet 0   spironolactone  (ALDACTONE ) 25 MG tablet Take 1 tablet (25 mg total) by mouth daily. 90 tablet 3   No current facility-administered medications for this encounter.    Allergies  Allergen Reactions   Adenosine      SOB and pressure for 20 seconds      Social History   Socioeconomic History   Marital status: Married    Spouse name: Ronnie   Number of children: 2   Years of education: Not on file   Highest education level: Bachelor's degree (e.g., BA, AB, BS)  Occupational History   Occupation: EC teacher    Employer: [employer]  Tobacco Use   Smoking status: Former    Current packs/day: 0.50    Average packs/day: 0.5 packs/day for 15.0 years (7.5 ttl pk-yrs)    Types: Cigarettes   Smokeless tobacco: Never   Tobacco comments:    info given 12/14/2019  Vaping Use   Vaping status: Never Used  Substance and Sexual Activity   Alcohol use: Not Currently   Drug use: No   Sexual activity: Yes    Birth control/protection: Post-menopausal  Other Topics Concern   Not on file  Social History Narrative   Not on file   Social Drivers of Health   Financial Resource Strain: Low Risk  (09/21/2024)   Overall Financial Resource Strain (CARDIA)    Difficulty of Paying Living Expenses: Not very hard  Food Insecurity: No Food Insecurity (09/23/2024)   Hunger Vital Sign    Worried About Running Out of Food in the Last Year: Never true    Ran Out  of Food in the Last Year: Never true  Transportation Needs: No Transportation Needs (09/23/2024)   PRAPARE - Administrator, Civil Service (Medical): No    Lack of Transportation (Non-Medical): No  Physical Activity: Inactive (07/03/2024)   Exercise Vital Sign    Days of Exercise per Week: 0 days    Minutes of Exercise per Session: Not on file  Stress: No Stress Concern Present (07/03/2024)   [employer] of Occupational Health - Occupational Stress Questionnaire    Feeling of Stress: Only a little  Social Connections: Socially Integrated (07/03/2024)   Social Connection and Isolation Panel  Frequency of Communication with Friends and Family: More than three times a week    Frequency of Social Gatherings with Friends and Family: Once a week    Attends Religious Services: 1 to 4 times per year    Active Member of [employer] or Organizations: Yes    Attends Banker Meetings: 1 to 4 times per year    Marital Status: Married  Catering manager Violence: Not At Risk (09/23/2024)   Humiliation, Afraid, Rape, and Kick questionnaire    Fear of Current or Ex-Partner: No    Emotionally Abused: No    Physically Abused: No    Sexually Abused: No      Family History  Problem Relation Age of Onset   Diabetes Mother    Hypertension Mother    Inflammatory bowel disease Maternal Aunt    Colon cancer Paternal Aunt    Colon polyps Maternal Grandmother    Hypertension Other    Liver cancer Neg Hx    Pancreatic cancer Neg Hx    Rectal cancer Neg Hx    Stomach cancer Neg Hx    Esophageal cancer Neg Hx     Vitals:   09/27/24 1453  BP: (!) 120/58  Pulse: (!) 103  SpO2: 98%  Weight: 87.2 kg (192 lb 3.2 oz)  Height: 5' 8 (1.727 m)    PHYSICAL EXAM: General:  Well appearing.  Cor: No JVD. Regular rate & rhythm, tachy. No murmurs. Lungs: clear Abdomen: soft, nontender, nondistended.  Extremities: no edema Neuro: alert & oriented x 3. Affect pleasant.  ZRH:Dpwld  tachycardia 103 bpm, RBB w/ wide QRS 156 ms   ASSESSMENT & PLAN:  HFrEF -Hx peripartum cardiomyopathy in 2005, EF down to 25-30% w/ later recovery in EF. No CAD on cath. Also took phentermine  for weight loss in the past. -Echo 5/25: EF 40-45%, WMA in LAD territory -Echo 9/25: LVEF 25-30%, WMA, RV okay, moderate pericardial effusion.  -R/LHC 09/21/24 w/ severe 2V CAD, LVEDP 33, RA mean 11, PA 55/30 (39), PCWP mean 29, Fick CO/CI 6.12/3.02, PAPi 2.3 -NYHA II/early III. Volume looks okay on exam but ReDS elevated at 39%. Increase lasix to 40 mg BID X 3 days and increase KCL to 40 mEq daily X 3 days. Afterwards, reduce lasix back to 40 mg daily and KCL to 20 mEq daily -Restart spiro 25 mg daily -Increase metoprolol  xl to 75 mg daily -Continue jardiance  (25 mg dose for DM) -Working on preauth for [employer], will hold off on starting until after CABG to minimize risk of peri-op hypotension -Plan for RHC prior to CABG to reassess filling pressures/hemodynamics. Will discuss timing with Dr. Kerrin  CAD -Cath 10/25 with severe 2VCAD  -TCTS planning CABG once medically optimized, sees TCTS today -On aspirin  + atorvastatin  80  Tobacco use -Recommend complete smoking cessation  DM II -A1c 8.3% in 9/25 -Per primary -Continue jardiance . Also on ozempic .  SVT -S/p ablation July 2025 -Sinus tach today   Referred to HFSW (PCP, Medications, Transportation, ETOH Abuse, Drug Abuse, Insurance, Financial ): No Refer to Pharmacy: Yes, Entresto pre-auth Refer to Home Health: No Refer to Advanced Heart Failure Clinic: Yes Refer to General Cardiology: No, already established  Follow up  2 weeks with APP, 4-6 weeks with Dr. Rolan. May need to adjust follow-ups pending timing of CABG. Will follow along with TCTS and Cardiology.

## 2024-09-27 NOTE — Addendum Note (Signed)
 Encounter addended by: Colletta Manuelita Garre, PA-C on: 09/27/2024 4:24 PM  Actions taken: Clinical Note Signed

## 2024-09-27 NOTE — Progress Notes (Signed)
 PCP is [name], Karlynn GAILS, MD Referring Provider is Ladona Heinz, MD  Chief Complaint  Patient presents with   Coronary Artery Disease    New patient consult, CATH 10/1, ECHO 9/30    HPI: Ms.  [name] [name] returns to discuss management of her coronary disease and ischemic cardiomyopathy.  [name] [name] is a 58 year old woman with a history significant for type 2 diabetes, hyperlipidemia peripartum cardiomyopathy, DVT, SVT status post ablation, reflux, hepatitis, and recently diagnosed two-vessel CAD, ischemic cardiomyopathy, and acute on chronic left ventricular systolic failure.  She choked on some food and required Heimlich maneuver by her husband.  She had a rib fracture.  After that she noted worsening chest pain and shortness of breath.  The pain became pressure that she described as an elephant sitting on her chest.  She developed bilateral peripheral edema.  She had mildly elevated troponins.  She had an echocardiogram which showed her ejection fraction was significantly reduced at 25 to 30% with no significant valvular pathology.  Right ventricular function was normal.  There was a moderate pericardial effusion.  She underwent cardiac catheterization which showed severe two-vessel disease involving the LAD large diagonal branch and the right coronary artery.  I saw her in the hospital.  She was not ready for surgery at that time.  The plan was for her to go home and have medical management and then do an interval surgery once she was in a better shape.  She saw [name] in the heart failure team earlier today.  Overall doing well but still volume overloaded and her diuretics were increased.  Since discharge she has had couple of twinges of chest pain but no severe episodes or anything that caused her concern.   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   Hydradenitis    right   Neuromuscular disorder (HCC)     Past Surgical History:  Procedure Laterality Date   CESAREAN SECTION     x 2   CHOLECYSTECTOMY  2006   CORONARY PRESSURE/FFR WITH 3D MAPPING N/A 09/21/2024   Procedure: Coronary Pressure/FFR w/3D Mapping;  Surgeon: Ladona Heinz, MD;  Location: [facility];  Service: Cardiovascular;  Laterality: N/A;   RIGHT/LEFT HEART CATH AND CORONARY ANGIOGRAPHY N/A 09/21/2024   Procedure: RIGHT/LEFT HEART CATH AND CORONARY ANGIOGRAPHY;  Surgeon: Ladona Heinz, MD;  Location: [facility];  Service: Cardiovascular;  Laterality: N/A;   SVT ABLATION N/A 07/20/2024   Procedure: SVT ABLATION;  Surgeon: Waddell Danelle ORN, MD;  Location: [facility];  Service: Cardiovascular;  Laterality: N/A;    Family History  Problem Relation Age of Onset   Diabetes Mother    Hypertension Mother    Inflammatory bowel disease Maternal Aunt    Colon cancer Paternal Aunt    Colon polyps Maternal Grandmother    Hypertension Other    Liver cancer Neg Hx    Pancreatic cancer Neg Hx    Rectal cancer Neg Hx    Stomach cancer Neg Hx    Esophageal cancer Neg Hx     Social History Social History   Tobacco Use   Smoking status: Former    Current packs/day:  0.50    Average packs/day: 0.5 packs/day for 15.0 years (7.5 ttl pk-yrs)    Types: Cigarettes   Smokeless tobacco: Never   Tobacco comments:    info given 12/14/2019  Vaping Use   Vaping status: Never Used  Substance Use Topics   Alcohol use: Not Currently   Drug use: No    Current Outpatient Medications  Medication Sig Dispense Refill   Albuterol -Budesonide (AIRSUPRA ) 90-80 MCG/ACT AERO Inhale 2 Inhalations into the lungs 4 (four) times daily as needed. 10 g 5   ALPRAZolam  (XANAX ) 0.5 MG tablet TAKE 1 TABLET(0.5 MG) BY MOUTH TWICE DAILY (Patient taking differently: Take 0.5 mg by mouth  daily as needed for anxiety.) 180 tablet 0   aspirin  81 MG chewable tablet Chew 1 tablet (81 mg total) by mouth daily. 90 tablet 1   atorvastatin  (LIPITOR) 80 MG tablet Take 1 tablet (80 mg total) by mouth daily. 30 tablet 0   buPROPion  (WELLBUTRIN  SR) 150 MG 12 hr tablet TAKE 1 TABLET BY MOUTH TWICE DAILY 180 tablet 1   Cholecalciferol  (EQL VITAMIN D3) 1000 units tablet Take 1 tablet (1,000 Units total) by mouth daily. 90 tablet 1   citalopram  (CELEXA ) 40 MG tablet Take 1 tablet (40 mg total) by mouth daily. 90 tablet 3   furosemide (LASIX) 40 MG tablet Take 1 tablet (40 mg total) by mouth daily. 30 tablet 0   gabapentin  (NEURONTIN ) 100 MG capsule TAKE 1 CAPSULE(100 MG) BY MOUTH THREE TIMES DAILY 90 capsule 5   GLYXAMBI  25-5 MG TABS TAKE 1 TABLET BY MOUTH DAILY 90 tablet 3   metFORMIN  (GLUCOPHAGE ) 500 MG tablet TAKE 2 TABLETS TWICE DAILY WITH FOOD 360 tablet 0   metoprolol  succinate (TOPROL -XL) 50 MG 24 hr tablet Take 1.5 tablets (75 mg total) by mouth daily. Take with or immediately following a meal. 45 tablet 3   nitroGLYCERIN  (NITROSTAT ) 0.4 MG SL tablet Place 1 tablet (0.4 mg total) under the tongue every 5 (five) minutes as needed for chest pain. 30 tablet 0   pantoprazole  (PROTONIX ) 40 MG tablet TAKE 1 TABLET(40 MG) BY MOUTH TWICE DAILY 180 tablet 3   potassium chloride  SA (KLOR-CON  M) 20 MEQ tablet Take 1 tablet (20 mEq total) by mouth daily. 30 tablet 0   repaglinide  (PRANDIN ) 2 MG tablet TAKE 1 TABLET BY MOUTH THREE TIMES DAILY BEFORE MEALS 270 tablet 3   sacubitril-valsartan (ENTRESTO) 24-26 MG Take 1 tablet by mouth 2 (two) times daily. 60 tablet 0   Semaglutide , 2 MG/DOSE, 8 MG/3ML SOPN 2 mg sq weekly (Patient taking differently: Inject 1 mg into the skin every Sunday.) 3 mL 5   spironolactone  (ALDACTONE ) 25 MG tablet Take 1 tablet (25 mg total) by mouth daily. 90 tablet 3   No current facility-administered medications for this visit.    Allergies  Allergen Reactions   Adenosine       SOB and pressure for 20 seconds    Review of Systems  Constitutional:  Negative for unexpected weight change.  Respiratory:  Positive for shortness of breath.   Cardiovascular:  Positive for chest pain and leg swelling.  All other systems reviewed and are negative.   BP 117/64 (BP Location: Right Arm, Patient Position: Sitting, Cuff Size: Normal)   Pulse (!) 103   Resp 20   Ht 5' 8 (1.727 m)   Wt 193 lb 8 oz (87.8 kg)   LMP 03/02/2017   SpO2 97% Comment: RA  BMI 29.42 kg/m  Physical Exam Vitals  reviewed.  Constitutional:      General: She is not in acute distress.    Appearance: Normal appearance.  HENT:     Head: Normocephalic and atraumatic.  Eyes:     General: No scleral icterus.    Extraocular Movements: Extraocular movements intact.  Neck:     Vascular: No carotid bruit.  Cardiovascular:     Rate and Rhythm: Normal rate and regular rhythm.     Heart sounds:     Gallop (? S4) present.     Comments: Normal Allen's test on left Pulmonary:     Effort: Pulmonary effort is normal. No respiratory distress.     Breath sounds: Normal breath sounds. No wheezing or rales.  Abdominal:     General: There is no distension.     Palpations: Abdomen is soft.  Musculoskeletal:     Cervical back: Neck supple.     Right lower leg: No edema.     Left lower leg: No edema.  Skin:    General: Skin is warm and dry.  Neurological:     General: No focal deficit present.     Mental Status: She is alert and oriented to person, place, and time.     Cranial Nerves: No cranial nerve deficit.     Motor: No weakness.    Diagnostic Tests: Echocardiogram 09/20/2024 IMPRESSIONS     1. Left ventricular ejection fraction, by estimation, is 25 to 30%. The  left ventricle has severely decreased function. The left ventricle  demonstrates regional wall motion abnormalities (see scoring  diagram/findings for description). The left  ventricular internal cavity size was mildly to  moderately dilated. Left  ventricular diastolic parameters are consistent with Grade I diastolic  dysfunction (impaired relaxation).   2. Right ventricular systolic function is normal. The right ventricular  size is normal. Tricuspid regurgitation signal is inadequate for assessing  PA pressure.   3. Left atrial size was mildly dilated.   4. Right atrial size was mildly dilated.   5. Moderate pericardial effusion. The pericardial effusion is  circumferential. There is no evidence of cardiac tamponade.   6. The mitral valve is abnormal. Mild mitral valve regurgitation.   7. The aortic valve is abnormal. Aortic valve regurgitation is not  visualized. Aortic valve sclerosis/calcification is present, without any  evidence of aortic stenosis.   8. The inferior vena cava is normal in size with greater than 50%  respiratory variability, suggesting right atrial pressure of 3 mmHg.   Comparison(s): A prior study was performed on 05/15/2024. Prior images  reviewed side by side. Left ventricular ejection fraction was 40-45% with  akinesis of the distal anteroseptal wall and apex which appear more  pronounced on today's study. There was no   pericardial effusion or mitral regurgitation noted at that time.    Cardiac Catheterization 09/21/24: Hemodynamic data: Moderate pulmonary hypertension with markedly elevated LVEDP consistent with WHO group 2 PAH.  RV function is preserved with PAPi 2.3. COP 6.12 andn CI 3.02 by Fick LVEDP 33 mmHg.  There is no pressure gradient across the aortic valve.   Angiographic data: LM: Calcified but widely patent. LAD: Calcified in the proximal segment.  There is moderate diffuse disease in the proximal segment.  There is a proximal 60% stenosis followed by 80% stenosis distal to the origin of D1 with FFR 0.77, indicating hemodynamically significant stenosis.  Large D1 with proximal 80% stenosis with FFR 0.67, hemodynamically significant.  Distal to apical LAD has 90%  focal  stenosis. LCx: Gives origin to large OM 2 and OM 3, mild diffuse disease. RCA: Large-caliber vessel, gives origin to large PDA and large PL branch.  Proximal segment has a 99% calcific stenosis.      Impression and recommendations: Patient's LAD and D1 and PDA and PL branches are good targets for CABG.  Once patient is optimized medically from heart failure, would benefit from consideration for surgical revascularization.  Patient was started on Farxiga today. Suspect can be discharged once stable with elective CABG.   I personally reviewed the catheterization images.  Severe two-vessel coronary disease involving LAD and RCA.  LAD, distal right, and large diagonal are all reasonable targets for bypass.  Impression: [name] [name] is a 58 year old woman with a history significant for type 2 diabetes, hyperlipidemia peripartum cardiomyopathy, DVT, SVT status post ablation, reflux, hepatitis, and recently diagnosed two-vessel CAD, ischemic cardiomyopathy, and acute on chronic left ventricular systolic failure.  Two-vessel coronary disease with severe left ventricular dysfunction-coronary bypass grafting is indicated for survival benefit and relief of symptoms.  She understands that this will not necessarily correct her left ventricular dysfunction but should make it easier to manage medically.  I informed her of the general nature of the proposed operation.  She understands the need for general anesthesia, the incisions to be used, the use of cardiopulmonary bypass, the use of temporary pacemaker wires and drainage tubes postoperatively, the expected hospital stay, and the overall recovery.  I informed her of the indications, risks, benefits, and alternatives.  She understands the risks include, but not limited to death, MI, DVT, PE, bleeding, possible need for transfusion, cardiac failure requiring mechanical support, infection, cardiac arrhythmias, respiratory or renal failure, as well as  possibility of other unforeseeable complications.  She has a normal Allen's test on the left.  Will check the left radial artery to see if it suitable for use as a bypass conduit.  If so we will plan to use it probably on the right coronary.  Dr. Rolan wants to do a repeat right heart cath.  Tentatively will plan to have her admitted and undergo right heart cath on 10/24/2024 and then CABG on Wednesday, 10/26/2024.  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  Plan: Coronary artery bypass grafting with left radial artery harvest.  Elspeth JAYSON Millers, MD [facility] [phone]

## 2024-09-28 ENCOUNTER — Inpatient Hospital Stay: Admitting: Internal Medicine

## 2024-09-28 ENCOUNTER — Encounter: Payer: Self-pay | Admitting: *Deleted

## 2024-09-28 ENCOUNTER — Encounter: Payer: Self-pay | Admitting: Oncology

## 2024-09-28 ENCOUNTER — Encounter (HOSPITAL_COMMUNITY): Payer: Self-pay

## 2024-09-28 ENCOUNTER — Other Ambulatory Visit (HOSPITAL_COMMUNITY): Payer: Self-pay

## 2024-09-28 ENCOUNTER — Inpatient Hospital Stay: Payer: BC Managed Care – PPO | Attending: Physician Assistant

## 2024-09-28 DIAGNOSIS — D509 Iron deficiency anemia, unspecified: Secondary | ICD-10-CM | POA: Insufficient documentation

## 2024-09-28 DIAGNOSIS — I5020 Unspecified systolic (congestive) heart failure: Secondary | ICD-10-CM

## 2024-09-28 LAB — CBC WITH DIFFERENTIAL (CANCER CENTER ONLY)
Abs Immature Granulocytes: 0.05 K/uL (ref 0.00–0.07)
Basophils Absolute: 0.1 K/uL (ref 0.0–0.1)
Basophils Relative: 1 %
Eosinophils Absolute: 0.2 K/uL (ref 0.0–0.5)
Eosinophils Relative: 2 %
HCT: 39.7 % (ref 36.0–46.0)
Hemoglobin: 12.3 g/dL (ref 12.0–15.0)
Immature Granulocytes: 1 %
Lymphocytes Relative: 28 %
Lymphs Abs: 2.8 K/uL (ref 0.7–4.0)
MCH: 25.1 pg — ABNORMAL LOW (ref 26.0–34.0)
MCHC: 31 g/dL (ref 30.0–36.0)
MCV: 81 fL (ref 80.0–100.0)
Monocytes Absolute: 0.5 K/uL (ref 0.1–1.0)
Monocytes Relative: 5 %
Neutro Abs: 6.5 K/uL (ref 1.7–7.7)
Neutrophils Relative %: 63 %
Platelet Count: 293 K/uL (ref 150–400)
RBC: 4.9 MIL/uL (ref 3.87–5.11)
RDW: 14.5 % (ref 11.5–15.5)
WBC Count: 10.3 K/uL (ref 4.0–10.5)
nRBC: 0 % (ref 0.0–0.2)

## 2024-09-28 LAB — IRON AND IRON BINDING CAPACITY (CC-WL,HP ONLY)
Iron: 48 ug/dL (ref 28–170)
Saturation Ratios: 11 % (ref 10.4–31.8)
TIBC: 445 ug/dL (ref 250–450)
UIBC: 397 ug/dL (ref 148–442)

## 2024-09-28 LAB — FERRITIN: Ferritin: 21 ng/mL (ref 11–307)

## 2024-09-28 NOTE — Telephone Encounter (Signed)
 Advanced Heart Failure Patient Advocate Encounter  Attempted to submit prior authorization for Entresto [facility]). This prior authorization was cancelled stating [employer] is not the processor.  Contacted insurance by phone, representative stated that this patient is on a plan that does not accept prior authorizations. The covered formulary is fixed and there are no exceptions to the formulary.  This patient may be able to use discount / savings cards such as GoodRx which shows generic Entresto for 30 days as <60$.  Rachel DEL, CPhT Rx Patient Advocate Phone: [phone]

## 2024-09-28 NOTE — Telephone Encounter (Signed)
 Patient called and [facility] scheduled for 10/24/2024 @ 7.30 in the morning. Instructions given to patient over the phone and she verbalized understanding. Letter of instructions also sent out in the mail and via my chart

## 2024-09-29 ENCOUNTER — Encounter: Admitting: Thoracic Surgery (Cardiothoracic Vascular Surgery)

## 2024-10-03 ENCOUNTER — Ambulatory Visit: Admitting: Internal Medicine

## 2024-10-03 ENCOUNTER — Encounter: Payer: Self-pay | Admitting: Internal Medicine

## 2024-10-03 VITALS — BP 104/62 | HR 105 | Temp 98.1°F | Ht 68.0 in | Wt 189.2 lb

## 2024-10-03 DIAGNOSIS — I2510 Atherosclerotic heart disease of native coronary artery without angina pectoris: Secondary | ICD-10-CM | POA: Diagnosis not present

## 2024-10-03 DIAGNOSIS — D509 Iron deficiency anemia, unspecified: Secondary | ICD-10-CM | POA: Diagnosis not present

## 2024-10-03 DIAGNOSIS — E876 Hypokalemia: Secondary | ICD-10-CM

## 2024-10-03 DIAGNOSIS — I5023 Acute on chronic systolic (congestive) heart failure: Secondary | ICD-10-CM

## 2024-10-03 DIAGNOSIS — I5043 Acute on chronic combined systolic (congestive) and diastolic (congestive) heart failure: Secondary | ICD-10-CM | POA: Diagnosis not present

## 2024-10-03 DIAGNOSIS — E785 Hyperlipidemia, unspecified: Secondary | ICD-10-CM

## 2024-10-03 DIAGNOSIS — E1169 Type 2 diabetes mellitus with other specified complication: Secondary | ICD-10-CM | POA: Diagnosis not present

## 2024-10-03 DIAGNOSIS — Z7985 Long-term (current) use of injectable non-insulin antidiabetic drugs: Secondary | ICD-10-CM

## 2024-10-03 DIAGNOSIS — I2583 Coronary atherosclerosis due to lipid rich plaque: Secondary | ICD-10-CM

## 2024-10-03 MED ORDER — ALPRAZOLAM 0.5 MG PO TABS: ORAL_TABLET | ORAL | 0 refills | Status: AC

## 2024-10-03 MED ORDER — POTASSIUM CHLORIDE ER 8 MEQ PO TBCR
8.0000 meq | EXTENDED_RELEASE_TABLET | Freq: Three times a day (TID) | ORAL | 5 refills | Status: DC
Start: 2024-10-03 — End: 2024-10-24

## 2024-10-03 NOTE — Assessment & Plan Note (Signed)
 On Ozempic  - hold 1 -1.5 wk prior to surgery

## 2024-10-03 NOTE — Assessment & Plan Note (Signed)
 Per Dr Kerrin:  Two-vessel coronary disease with severe left ventricular dysfunction-coronary bypass grafting is indicated for survival benefit and relief of symptoms.  She understands that this will not necessarily correct her left ventricular dysfunction but should make it easier to manage medically.   I informed her of the general nature of the proposed operation.  She understands the need for general anesthesia, the incisions to be used, the use of cardiopulmonary bypass, the use of temporary pacemaker wires and drainage tubes postoperatively, the expected hospital stay, and the overall recovery.  I informed her of the indications, risks, benefits, and alternatives.  She understands the risks include, but not limited to death, MI, DVT, PE, bleeding, possible need for transfusion, cardiac failure requiring mechanical support, infection, cardiac arrhythmias, respiratory or renal failure, as well as possibility of other unforeseeable complications.   She has a normal Allen's test on the left.  Will check the left radial artery to see if it suitable for use as a bypass conduit.  If so we will plan to use it probably on the right coronary.   Dr. Rolan wants to do a repeat right heart cath.  Tentatively will plan to have her admitted and undergo right heart cath on 10/24/2024 and then CABG on Wednesday, 10/26/2024.  Will coordinate with the advanced heart failure team.

## 2024-10-03 NOTE — Assessment & Plan Note (Signed)
Iron deficiency Dr Alen Blew  6/13 severe 5/14 not responding to po iron - on IV iron infusions

## 2024-10-03 NOTE — Assessment & Plan Note (Signed)
 Kdur - too big Use Klor-con  8 tid

## 2024-10-03 NOTE — Progress Notes (Signed)
 Subjective:  Patient ID: [name] [name], female    DOB: Apr 09, 1966  Age: 58 y.o. MRN: [id number]  CC: Hospitalization Follow-up [facility] follow up)   F/u on CAD, CHF  Per Dr Kerrin:  Two-vessel coronary disease with severe left ventricular dysfunction-coronary bypass grafting is indicated for survival benefit and relief of symptoms.  She understands that this will not necessarily correct her left ventricular dysfunction but should make it easier to manage medically.   I informed her of the general nature of the proposed operation.  She understands the need for general anesthesia, the incisions to be used, the use of cardiopulmonary bypass, the use of temporary pacemaker wires and drainage tubes postoperatively, the expected hospital stay, and the overall recovery.  I informed her of the indications, risks, benefits, and alternatives.  She understands the risks include, but not limited to death, MI, DVT, PE, bleeding, possible need for transfusion, cardiac failure requiring mechanical support, infection, cardiac arrhythmias, respiratory or renal failure, as well as possibility of other unforeseeable complications.   She has a normal Allen's test on the left.  Will check the left radial artery to see if it suitable for use as a bypass conduit.  If so we will plan to use it probably on the right coronary.   Dr. Rolan wants to do a repeat right heart cath.  Tentatively will plan to have her admitted and undergo right heart cath on 10/24/2024 and then CABG on Wednesday, 10/26/2024.  Will coordinate with the advanced heart failure team.   Outpatient Medications Prior to Visit  Medication Sig Dispense Refill   Albuterol -Budesonide (AIRSUPRA ) 90-80 MCG/ACT AERO Inhale 2 Inhalations into the lungs 4 (four) times daily as needed. 10 g 5   aspirin  81 MG chewable tablet Chew 1 tablet (81 mg total) by mouth daily. 90 tablet 1   atorvastatin  (LIPITOR) 80 MG tablet Take 1 tablet (80 mg total) by  mouth daily. 30 tablet 0   buPROPion  (WELLBUTRIN  SR) 150 MG 12 hr tablet TAKE 1 TABLET BY MOUTH TWICE DAILY 180 tablet 1   Cholecalciferol  (EQL VITAMIN D3) 1000 units tablet Take 1 tablet (1,000 Units total) by mouth daily. 90 tablet 1   citalopram  (CELEXA ) 40 MG tablet Take 1 tablet (40 mg total) by mouth daily. 90 tablet 3   furosemide (LASIX) 40 MG tablet Take 1 tablet (40 mg total) by mouth daily. 30 tablet 0   gabapentin  (NEURONTIN ) 100 MG capsule TAKE 1 CAPSULE(100 MG) BY MOUTH THREE TIMES DAILY 90 capsule 5   GLYXAMBI  25-5 MG TABS TAKE 1 TABLET BY MOUTH DAILY 90 tablet 3   metFORMIN  (GLUCOPHAGE ) 500 MG tablet TAKE 2 TABLETS TWICE DAILY WITH FOOD 360 tablet 0   metoprolol  succinate (TOPROL -XL) 50 MG 24 hr tablet Take 1.5 tablets (75 mg total) by mouth daily. Take with or immediately following a meal. 45 tablet 3   nitroGLYCERIN  (NITROSTAT ) 0.4 MG SL tablet Place 1 tablet (0.4 mg total) under the tongue every 5 (five) minutes as needed for chest pain. 30 tablet 0   pantoprazole  (PROTONIX ) 40 MG tablet TAKE 1 TABLET(40 MG) BY MOUTH TWICE DAILY 180 tablet 3   repaglinide  (PRANDIN ) 2 MG tablet TAKE 1 TABLET BY MOUTH THREE TIMES DAILY BEFORE MEALS 270 tablet 3   sacubitril-valsartan (ENTRESTO) 24-26 MG Take 1 tablet by mouth 2 (two) times daily. 60 tablet 0   Semaglutide , 2 MG/DOSE, 8 MG/3ML SOPN 2 mg sq weekly (Patient taking differently: Inject 1 mg into the skin  every Sunday.) 3 mL 5   spironolactone  (ALDACTONE ) 25 MG tablet Take 1 tablet (25 mg total) by mouth daily. 90 tablet 3   ALPRAZolam  (XANAX ) 0.5 MG tablet TAKE 1 TABLET(0.5 MG) BY MOUTH TWICE DAILY (Patient taking differently: Take 0.5 mg by mouth daily as needed for anxiety.) 180 tablet 0   potassium chloride  SA (KLOR-CON  M) 20 MEQ tablet Take 1 tablet (20 mEq total) by mouth daily. 30 tablet 0   No facility-administered medications prior to visit.    ROS: Review of Systems  Constitutional:  Positive for fatigue. Negative for  activity change, appetite change, chills and unexpected weight change.  HENT:  Negative for congestion, mouth sores and sinus pressure.   Eyes:  Negative for visual disturbance.  Respiratory:  Positive for shortness of breath. Negative for cough and chest tightness.   Gastrointestinal:  Negative for abdominal pain and nausea.  Genitourinary:  Negative for difficulty urinating, frequency and vaginal pain.  Musculoskeletal:  Negative for back pain and gait problem.  Skin:  Negative for pallor and rash.  Neurological:  Negative for dizziness, tremors, weakness, numbness and headaches.  Psychiatric/Behavioral:  Negative for confusion and sleep disturbance.     Objective:  BP 104/62   Pulse (!) 105   Temp 98.1 F (36.7 C)   Ht 5' 8 (1.727 m)   Wt 189 lb 3.2 oz (85.8 kg)   LMP 03/02/2017   SpO2 96%   BMI 28.77 kg/m   BP Readings from Last 3 Encounters:  10/03/24 104/62  09/27/24 (!) 120/58  09/27/24 117/64    Wt Readings from Last 3 Encounters:  10/03/24 189 lb 3.2 oz (85.8 kg)  09/27/24 192 lb 3.2 oz (87.2 kg)  09/27/24 193 lb 8 oz (87.8 kg)    Physical Exam Constitutional:      General: She is not in acute distress.    Appearance: She is well-developed.  HENT:     Head: Normocephalic.     Right Ear: External ear normal.     Left Ear: External ear normal.     Nose: Nose normal.  Eyes:     General:        Right eye: No discharge.        Left eye: No discharge.     Conjunctiva/sclera: Conjunctivae normal.     Pupils: Pupils are equal, round, and reactive to light.  Neck:     Thyroid : No thyromegaly.     Vascular: No JVD.     Trachea: No tracheal deviation.  Cardiovascular:     Rate and Rhythm: Normal rate. Rhythm irregular.     Heart sounds: Murmur heard.  Pulmonary:     Effort: No respiratory distress.     Breath sounds: No stridor. No wheezing.  Abdominal:     General: Bowel sounds are normal. There is no distension.     Palpations: Abdomen is soft. There is  no mass.     Tenderness: There is no abdominal tenderness. There is no guarding or rebound.  Musculoskeletal:        General: No tenderness.     Cervical back: Normal range of motion and neck supple. No rigidity.  Lymphadenopathy:     Cervical: No cervical adenopathy.  Skin:    Findings: No erythema or rash.  Neurological:     Cranial Nerves: No cranial nerve deficit.     Motor: No abnormal muscle tone.     Coordination: Coordination normal.     Deep Tendon Reflexes:  Reflexes normal.  Psychiatric:        Behavior: Behavior normal.        Thought Content: Thought content normal.        Judgment: Judgment normal.     Lab Results  Component Value Date   WBC 10.3 09/28/2024   HGB 12.3 09/28/2024   HCT 39.7 09/28/2024   PLT 293 09/28/2024   GLUCOSE 128 (H) 09/27/2024   CHOL 131 09/20/2024   TRIG 306 (H) 09/21/2024   HDL 31 (L) 09/20/2024   LDLDIRECT 69.0 08/11/2019   LDLCALC 51 09/20/2024   ALT 16 09/20/2024   AST 20 09/20/2024   NA 135 09/27/2024   K 3.8 09/27/2024   CL 98 09/27/2024   CREATININE 0.72 09/27/2024   BUN 12 09/27/2024   CO2 24 09/27/2024   TSH 2.02 09/08/2024   INR 1.0 09/21/2024   HGBA1C 8.3 (H) 09/08/2024    No results found.  Assessment & Plan:   Problem List Items Addressed This Visit     Acute on chronic combined systolic and diastolic CHF (congestive heart failure) (HCC)   Per Dr Kerrin:  Two-vessel coronary disease with severe left ventricular dysfunction-coronary bypass grafting is indicated for survival benefit and relief of symptoms.  She understands that this will not necessarily correct her left ventricular dysfunction but should make it easier to manage medically.   I informed her of the general nature of the proposed operation.  She understands the need for general anesthesia, the incisions to be used, the use of cardiopulmonary bypass, the use of temporary pacemaker wires and drainage tubes postoperatively, the expected hospital  stay, and the overall recovery.  I informed her of the indications, risks, benefits, and alternatives.  She understands the risks include, but not limited to death, MI, DVT, PE, bleeding, possible need for transfusion, cardiac failure requiring mechanical support, infection, cardiac arrhythmias, respiratory or renal failure, as well as possibility of other unforeseeable complications.   She has a normal Allen's test on the left.  Will check the left radial artery to see if it suitable for use as a bypass conduit.  If so we will plan to use it probably on the right coronary.   Dr. Rolan wants to do a repeat right heart cath.  Tentatively will plan to have her admitted and undergo right heart cath on 10/24/2024 and then CABG on Wednesday, 10/26/2024.  Will coordinate with the advanced heart failure team.       Acute on chronic systolic CHF (congestive heart failure) (HCC)   Cont Glyxambi , Ozempic  Seems compensated clinically Cardiac cath is pending      CAD (coronary artery disease) - Primary   Per Dr Kerrin:  Two-vessel coronary disease with severe left ventricular dysfunction-coronary bypass grafting is indicated for survival benefit and relief of symptoms.  She understands that this will not necessarily correct her left ventricular dysfunction but should make it easier to manage medically.   I informed her of the general nature of the proposed operation.  She understands the need for general anesthesia, the incisions to be used, the use of cardiopulmonary bypass, the use of temporary pacemaker wires and drainage tubes postoperatively, the expected hospital stay, and the overall recovery.  I informed her of the indications, risks, benefits, and alternatives.  She understands the risks include, but not limited to death, MI, DVT, PE, bleeding, possible need for transfusion, cardiac failure requiring mechanical support, infection, cardiac arrhythmias, respiratory or renal failure, as well as  possibility  of other unforeseeable complications.   She has a normal Allen's test on the left.  Will check the left radial artery to see if it suitable for use as a bypass conduit.  If so we will plan to use it probably on the right coronary.   Dr. Rolan wants to do a repeat right heart cath.  Tentatively will plan to have her admitted and undergo right heart cath on 10/24/2024 and then CABG on Wednesday, 10/26/2024.  Will coordinate with the advanced heart failure team.       Hypokalemia   Kdur - too big Use Klor-con  8 tid      Iron  deficiency anemia   Iron  deficiency Dr Amadeo  6/13 severe 5/14 not responding to po iron  - on IV iron  infusions      Type 2 diabetes mellitus with hyperlipidemia (HCC)   On Ozempic  - hold 1 -1.5 wk prior to surgery          Meds ordered this encounter  Medications   ALPRAZolam  (XANAX ) 0.5 MG tablet    Sig: TAKE 1 TABLET(0.5 MG) BY MOUTH TWICE DAILY    Dispense:  180 tablet    Refill:  0   potassium chloride  (KLOR-CON ) 8 MEQ tablet    Sig: Take 1 tablet (8 mEq total) by mouth 3 (three) times daily.    Dispense:  90 tablet    Refill:  5      Follow-up: Return in about 3 months (around 01/03/2025) for a follow-up visit.  Marolyn Noel, MD

## 2024-10-03 NOTE — Assessment & Plan Note (Signed)
 Cont Glyxambi , Ozempic  Seems compensated clinically Cardiac cath is pending

## 2024-10-06 ENCOUNTER — Telehealth: Payer: Self-pay

## 2024-10-06 ENCOUNTER — Encounter (HOSPITAL_COMMUNITY)

## 2024-10-06 NOTE — Telephone Encounter (Signed)
 FMLA form competed and faxed to [phone] [employer]. Beginning LOA 10/24/24 through 01/23/25. DOS 10/26/24

## 2024-10-14 NOTE — Progress Notes (Signed)
 Advanced Heart Failure Clinic Consult  PCP: Dr. Garald Cardiologist: Dr. Floretta CTS: Dr. Asberry HF Cardiologist: Dr. Rolan  HPI:  [name] [name] is a 58 y.o. female with history of atrial tachycardia status post ablation in July 2025, peripartum cardiomyopathy in 2005, hyperlipidemia, DM II, GERD, tobacco use, GAD, iron  deficiency anemia.  On review of old records note hx of admission for CHF in June 2005 after delivery of her first child the month prior. EF 25-30% at that time. No CAD on cath. EF had recovered on subsequent echo in June 2006.  Later had admission with SVT in 5/25. Echo at that time w/ LVEF 40-45%. Had SVT ablation in July 2025. Underwent coronary CTA which showed flow-limiting disease involving LAD, RCA and LCX by CT-FFR.  She was scheduled to undergo coronary angiography on October 2.    She presented 09/19/24 with worsening shortness of breath, orthopnea, PND and episodes of 2 different types of chest pain.  She was admitted with acute on chronic CHF.  Cardiology was consulted.  Echo w/ LVEF 25-30%, WMA, RV okay, moderate pericardial effusion. R/LHC 09/21/24 w/ severe 2V CAD, LVEDP 33, RA mean 11, PA 55/30 (39), PCWP mean 29, Fick CO/CI 6.12/3.02. She was diuresed and GDMT titrated. Seen by TCTS with plan for CABG once optimized. Referred for f/u in [facility] clinic for evaluation.   Seen in [facility] 09/27/24 and referred to AHF.  Today she presents to establish care, referred from [facility]. Overall feeling fatigued. Feels heart flutters, no CP or need for nitroglycerin . No SOB walking on flat ground, does have dyspnea walking up an incline. Occasional dizziness, no syncope or falls. Denies abnormal bleeding, CP, edema, or PND/Orthopnea. Appetite ok. Weight at home 184 pounds. Taking all medications. Works as systems developer at [employer].  ReDs reading: 25 %, normal  ECG (personally reviewed): none ordered today  Labs (10/25): K 3.8, creatinine  0.72  Cardiac Studies - R/LHC (10/25): 2v CAD; RA 11, PA 55/30 (39), PCWP 20, CO/CI (Fick) 6.12/3.02, PAPi 2.3, LVEDP 33  - Coronary CTA (7/25): low-limiting disease involving LAD, RCA and LCX by CT-FFR.   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 90 tablet 1   atorvastatin  (LIPITOR) 80 MG tablet Take 1 tablet (80 mg total) by mouth daily. 30 tablet 0   buPROPion  (WELLBUTRIN  SR) 150 MG 12 hr tablet TAKE 1 TABLET BY MOUTH TWICE DAILY 180 tablet 1   Cholecalciferol  (EQL VITAMIN D3) 1000 units tablet Take 1 tablet (1,000 Units total) by mouth daily. 90 tablet 1   citalopram  (CELEXA ) 40 MG tablet Take 1 tablet (40 mg total) by mouth daily. 90 tablet 3   furosemide (LASIX) 40 MG tablet Take 1 tablet (40 mg total) by mouth daily. 30 tablet 0   gabapentin  (NEURONTIN ) 100 MG capsule TAKE 1 CAPSULE(100 MG) BY MOUTH THREE TIMES DAILY (Patient taking differently:  Take 100 mg by mouth daily as needed.) 90 capsule 5   GLYXAMBI  25-5 MG TABS TAKE 1 TABLET BY MOUTH DAILY 90 tablet 3   metFORMIN  (GLUCOPHAGE ) 500 MG tablet TAKE 2 TABLETS TWICE DAILY WITH FOOD 360 tablet 0   metoprolol  succinate (TOPROL -XL) 50 MG 24 hr tablet Take 1.5 tablets (75 mg total) by mouth daily. Take with or immediately following a meal. 45 tablet 3   nitroGLYCERIN  (NITROSTAT ) 0.4 MG SL tablet Place 1 tablet (0.4 mg total) under the tongue every 5 (five) minutes as needed for chest pain. 30 tablet 0   pantoprazole  (PROTONIX ) 40 MG tablet TAKE 1 TABLET(40 MG) BY  MOUTH TWICE DAILY 180 tablet 3   potassium chloride  (KLOR-CON ) 8 MEQ tablet Take 1 tablet (8 mEq total) by mouth 3 (three) times daily. 90 tablet 5   repaglinide  (PRANDIN ) 2 MG tablet TAKE 1 TABLET BY MOUTH THREE TIMES DAILY BEFORE MEALS 270 tablet 3   Semaglutide , 2 MG/DOSE, 8 MG/3ML SOPN 2 mg sq weekly (Patient taking differently: Inject 1 mg into the skin every Sunday.) 3 mL 5   spironolactone  (ALDACTONE ) 25 MG tablet Take 1 tablet (25 mg total) by mouth daily. 90 tablet 3   Albuterol -Budesonide (AIRSUPRA ) 90-80 MCG/ACT AERO Inhale 2 Inhalations into the lungs 4 (four) times daily as needed. (Patient not taking: Reported on 10/19/2024) 10 g 5   ALPRAZolam  (XANAX ) 0.5 MG tablet TAKE 1 TABLET(0.5 MG) BY MOUTH TWICE DAILY (Patient not taking: Reported on 10/19/2024) 180 tablet 0   sacubitril-valsartan (ENTRESTO) 24-26 MG Take 1 tablet by mouth 2 (two) times daily. (Patient not taking: Reported on 10/19/2024) 60 tablet 0   No current facility-administered medications for this encounter.   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[place] [employer]  Tobacco Use   Smoking status: Former    Current packs/day: 0.50    Average packs/day: 0.5 packs/day for 15.0 years (7.5 ttl pk-yrs)    Types: Cigarettes   Smokeless tobacco: Never   Tobacco comments:    info given 12/14/2019  Vaping Use   Vaping status: Never Used  Substance and Sexual Activity   Alcohol use: Not Currently   Drug use: No   Sexual activity: Not Currently    Birth control/protection: Post-menopausal  Other Topics Concern   Not on file  Social History Narrative   Not on file   Social Drivers of Health    Financial Resource Strain: Low Risk  (09/21/2024)   Overall Financial Resource Strain (CARDIA)    Difficulty of Paying Living Expenses: Not very hard  Food Insecurity: No Food Insecurity (09/23/2024)   Hunger Vital Sign    Worried About Running Out of Food in the Last Year: Never true    Ran Out of Food in the Last Year: Never true  Transportation Needs: No Transportation Needs (09/23/2024)   PRAPARE - Administrator, Civil Service (Medical): No    Lack of Transportation (Non-Medical): No  Physical Activity: Inactive (07/03/2024)   Exercise Vital Sign    Days of Exercise per Week: 0 days    Minutes of Exercise  per Session: Not on file  Stress: No Stress Concern Present (07/03/2024)   [employer] of Occupational Health - Occupational Stress Questionnaire    Feeling of Stress: Only a little  Social Connections: Socially Integrated (07/03/2024)   Social Connection and Isolation Panel    Frequency of Communication with Friends and Family: More than three times a week    Frequency of Social Gatherings with Friends and Family: Once a week    Attends Religious Services: 1 to 4 times per year    Active Member of [employer] or Organizations: Yes    Attends Banker Meetings: 1 to 4 times per year    Marital Status: Married  Catering Manager Violence: Not At Risk (09/23/2024)   Humiliation, Afraid, Rape, and Kick questionnaire    Fear of Current or Ex-Partner: No    Emotionally Abused: No    Physically Abused: No    Sexually Abused: No   Family History  Problem Relation Age of Onset   Diabetes Mother    Hypertension Mother    Anxiety disorder Mother    Depression Mother    Hearing loss Mother    Miscarriages / Stillbirths Mother    Obesity Mother    Varicose Veins Mother    COPD Father    Depression Father    Hearing loss Father    Heart disease Father    Obesity Father    Inflammatory bowel disease Maternal Aunt    Colon cancer Paternal Aunt    Colon  polyps Maternal Grandmother    Arthritis Maternal Grandmother    Cancer Maternal Grandmother    Hypertension Maternal Grandmother    Obesity Maternal Grandmother    Hypertension Other    Anxiety disorder Maternal Aunt    Cancer Maternal Aunt    Diabetes Maternal Aunt    Liver cancer Neg Hx    Pancreatic cancer Neg Hx    Rectal cancer Neg Hx    Stomach cancer Neg Hx    Esophageal cancer Neg Hx     Wt Readings from Last 3 Encounters:  10/19/24 87.9 kg (193 lb 12.8 oz)  10/03/24 85.8 kg (189 lb 3.2 oz)  09/27/24 87.2 kg (192 lb 3.2 oz)   BP (!) 126/58   Pulse 98   Ht 5' 8 (1.727 m)   Wt 87.9 kg (193 lb 12.8 oz)   LMP 03/02/2017   SpO2 98%   BMI 29.47 kg/m   PHYSICAL EXAM: General:  NAD. No resp difficulty, walked into clinic HEENT: Normal Neck: Supple. No JVD. Cor: Regular rate & rhythm. No rubs, gallops or murmurs. Lungs: Clear Abdomen: Soft, nontender, nondistended.  Extremities: No cyanosis, clubbing, rash, edema Neuro: Alert & oriented x 3, moves all 4 extremities w/o difficulty. Affect pleasant.  ASSESSMENT & PLAN: 1. Chronic Systolic Heart Failure: She has a history of peripartum cardiomyopathy in 2005, EF down to 25-30% w/ later recovery in EF. No CAD on cath at that time. Also took phentermine  for weight loss in the past. Echo 5/25: EF 40-45%, WMA in LAD territory. Echo 9/25: LVEF 25-30%, WMA, RV okay, moderate pericardial effusion. Underwent R/LHC 09/21/24 w/ severe 2V CAD, LVEDP 33, RA 11, PA 55/30 (39), PCWP29, Fick CO/CI 6.12/3.02, PAPi 2.3. Plan for CABG 10/2024. Today, she has NYHA II symptoms. She is not volume overloaded, ReDs 25%. - Continue Lasix 40 mg daily. BMET and BNP today. - Continue Toprol  XL 75 mg daily. - Continue spironolactone  25 mg daily. - Continue Jardiance  25  mg daily. - Hold off on starting Entresto until after CABG to minimize risk of peri-op hypotension - Plan for RHC prior to CABG to reassess filling pressures/hemodynamics.  - Labs  today 2. CAD: Cath 10/25 with severe 2VCAD. TCTS planning CABG 10/26/24. No chest pain. - Continue aspirin  + atorvastatin  80 - Long discussion today regarding what to expect while hospitalized. 3. Tobacco use: remains quit 4. DM II: A1c 8.3% in 9/25 - Continue Jardiance  - Ozempic  on hold pre-op 5. SVT: s/p ablation 06/2024.  - Continue Toprol   Keep follow up next month, post-op with Dr. Rolan, as scheduled.  Harlene Gainer, FNP-BC 10/19/24

## 2024-10-14 NOTE — H&P (View-Only) (Signed)
 [facility] Consult  PCP: Dr. Garald Cardiologist: Dr. Floretta CTS: Dr. Asberry HF Cardiologist: Dr. Rolan  HPI:  [name] [name] is a 58 y.o. female with history of atrial tachycardia status post ablation in July 2025, peripartum cardiomyopathy in 2005, hyperlipidemia, DM II, GERD, tobacco use, GAD, iron  deficiency anemia.  On review of old records note hx of admission for CHF in June 2005 after delivery of her first child the month prior. EF 25-30% at that time. No CAD on cath. EF had recovered on subsequent echo in June 2006.  Later had admission with SVT in 5/25. Echo at that time w/ LVEF 40-45%. Had SVT ablation in July 2025. Underwent coronary CTA which showed flow-limiting disease involving LAD, RCA and LCX by CT-FFR.  She was scheduled to undergo coronary angiography on October 2.    She presented 09/19/24 with worsening shortness of breath, orthopnea, PND and episodes of 2 different types of chest pain.  She was admitted with acute on chronic CHF.  Cardiology was consulted.  Echo w/ LVEF 25-30%, WMA, RV okay, moderate pericardial effusion. R/LHC 09/21/24 w/ severe 2V CAD, LVEDP 33, RA mean 11, PA 55/30 (39), PCWP mean 29, Fick CO/CI 6.12/3.02. She was diuresed and GDMT titrated. Seen by TCTS with plan for CABG once optimized. Referred for f/u in [facility] clinic for evaluation.   Seen in [facility] 09/27/24 and referred to AHF.  Today she presents to establish care, referred from [facility]. Overall feeling fatigued. Feels heart flutters, no CP or need for nitroglycerin . No SOB walking on flat ground, does have dyspnea walking up an incline. Occasional dizziness, no syncope or falls. Denies abnormal bleeding, CP, edema, or PND/Orthopnea. Appetite ok. Weight at home 184 pounds. Taking all medications. Works as systems developer at [employer].  ReDs reading: 25 %, normal  ECG (personally reviewed): none ordered today  Labs (10/25): K 3.8, creatinine  0.72  Cardiac Studies - R/LHC (10/25): 2v CAD; RA 11, PA 55/30 (39), PCWP 20, CO/CI (Fick) 6.12/3.02, PAPi 2.3, LVEDP 33  - Coronary CTA (7/25): low-limiting disease involving LAD, RCA and LCX by CT-FFR.   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 90 tablet 1   atorvastatin  (LIPITOR) 80 MG tablet Take 1 tablet (80 mg total) by mouth daily. 30 tablet 0   buPROPion  (WELLBUTRIN  SR) 150 MG 12 hr tablet TAKE 1 TABLET BY MOUTH TWICE DAILY 180 tablet 1   Cholecalciferol  (EQL VITAMIN D3) 1000 units tablet Take 1 tablet (1,000 Units total) by mouth daily. 90 tablet 1   citalopram  (CELEXA ) 40 MG tablet Take 1 tablet (40 mg total) by mouth daily. 90 tablet 3   furosemide (LASIX) 40 MG tablet Take 1 tablet (40 mg total) by mouth daily. 30 tablet 0   gabapentin  (NEURONTIN ) 100 MG capsule TAKE 1 CAPSULE(100 MG) BY MOUTH THREE TIMES DAILY (Patient taking differently:  Take 100 mg by mouth daily as needed.) 90 capsule 5   GLYXAMBI  25-5 MG TABS TAKE 1 TABLET BY MOUTH DAILY 90 tablet 3   metFORMIN  (GLUCOPHAGE ) 500 MG tablet TAKE 2 TABLETS TWICE DAILY WITH FOOD 360 tablet 0   metoprolol  succinate (TOPROL -XL) 50 MG 24 hr tablet Take 1.5 tablets (75 mg total) by mouth daily. Take with or immediately following a meal. 45 tablet 3   nitroGLYCERIN  (NITROSTAT ) 0.4 MG SL tablet Place 1 tablet (0.4 mg total) under the tongue every 5 (five) minutes as needed for chest pain. 30 tablet 0   pantoprazole  (PROTONIX ) 40 MG tablet TAKE 1 TABLET(40 MG) BY  MOUTH TWICE DAILY 180 tablet 3   potassium chloride  (KLOR-CON ) 8 MEQ tablet Take 1 tablet (8 mEq total) by mouth 3 (three) times daily. 90 tablet 5   repaglinide  (PRANDIN ) 2 MG tablet TAKE 1 TABLET BY MOUTH THREE TIMES DAILY BEFORE MEALS 270 tablet 3   Semaglutide , 2 MG/DOSE, 8 MG/3ML SOPN 2 mg sq weekly (Patient taking differently: Inject 1 mg into the skin every Sunday.) 3 mL 5   spironolactone  (ALDACTONE ) 25 MG tablet Take 1 tablet (25 mg total) by mouth daily. 90 tablet 3   Albuterol -Budesonide (AIRSUPRA ) 90-80 MCG/ACT AERO Inhale 2 Inhalations into the lungs 4 (four) times daily as needed. (Patient not taking: Reported on 10/19/2024) 10 g 5   ALPRAZolam  (XANAX ) 0.5 MG tablet TAKE 1 TABLET(0.5 MG) BY MOUTH TWICE DAILY (Patient not taking: Reported on 10/19/2024) 180 tablet 0   sacubitril-valsartan (ENTRESTO) 24-26 MG Take 1 tablet by mouth 2 (two) times daily. (Patient not taking: Reported on 10/19/2024) 60 tablet 0   No current facility-administered medications for this encounter.   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[employer]  Tobacco Use   Smoking status: Former    Current packs/day: 0.50    Average packs/day: 0.5 packs/day for 15.0 years (7.5 ttl pk-yrs)    Types: Cigarettes   Smokeless tobacco: Never   Tobacco comments:    info given 12/14/2019  Vaping Use   Vaping status: Never Used  Substance and Sexual Activity   Alcohol use: Not Currently   Drug use: No   Sexual activity: Not Currently    Birth control/protection: Post-menopausal  Other Topics Concern   Not on file  Social History Narrative   Not on file   Social Drivers of Health    Financial Resource Strain: Low Risk  (09/21/2024)   Overall Financial Resource Strain (CARDIA)    Difficulty of Paying Living Expenses: Not very hard  Food Insecurity: No Food Insecurity (09/23/2024)   Hunger Vital Sign    Worried About Running Out of Food in the Last Year: Never true    Ran Out of Food in the Last Year: Never true  Transportation Needs: No Transportation Needs (09/23/2024)   PRAPARE - Administrator, Civil Service (Medical): No    Lack of Transportation (Non-Medical): No  Physical Activity: Inactive (07/03/2024)   Exercise Vital Sign    Days of Exercise per Week: 0 days    Minutes of Exercise  per Session: Not on file  Stress: No Stress Concern Present (07/03/2024)   [employer] of Occupational Health - Occupational Stress Questionnaire    Feeling of Stress: Only a little  Social Connections: Socially Integrated (07/03/2024)   Social Connection and Isolation Panel    Frequency of Communication with Friends and Family: More than three times a week    Frequency of Social Gatherings with Friends and Family: Once a week    Attends Religious Services: 1 to 4 times per year    Active Member of [employer] or Organizations: Yes    Attends Banker Meetings: 1 to 4 times per year    Marital Status: Married  Catering Manager Violence: Not At Risk (09/23/2024)   Humiliation, Afraid, Rape, and Kick questionnaire    Fear of Current or Ex-Partner: No    Emotionally Abused: No    Physically Abused: No    Sexually Abused: No   Family History  Problem Relation Age of Onset   Diabetes Mother    Hypertension Mother    Anxiety disorder Mother    Depression Mother    Hearing loss Mother    Miscarriages / Stillbirths Mother    Obesity Mother    Varicose Veins Mother    COPD Father    Depression Father    Hearing loss Father    Heart disease Father    Obesity Father    Inflammatory bowel disease Maternal Aunt    Colon cancer Paternal Aunt    Colon  polyps Maternal Grandmother    Arthritis Maternal Grandmother    Cancer Maternal Grandmother    Hypertension Maternal Grandmother    Obesity Maternal Grandmother    Hypertension Other    Anxiety disorder Maternal Aunt    Cancer Maternal Aunt    Diabetes Maternal Aunt    Liver cancer Neg Hx    Pancreatic cancer Neg Hx    Rectal cancer Neg Hx    Stomach cancer Neg Hx    Esophageal cancer Neg Hx     Wt Readings from Last 3 Encounters:  10/19/24 87.9 kg (193 lb 12.8 oz)  10/03/24 85.8 kg (189 lb 3.2 oz)  09/27/24 87.2 kg (192 lb 3.2 oz)   BP (!) 126/58   Pulse 98   Ht 5' 8 (1.727 m)   Wt 87.9 kg (193 lb 12.8 oz)   LMP 03/02/2017   SpO2 98%   BMI 29.47 kg/m   PHYSICAL EXAM: General:  NAD. No resp difficulty, walked into clinic HEENT: Normal Neck: Supple. No JVD. Cor: Regular rate & rhythm. No rubs, gallops or murmurs. Lungs: Clear Abdomen: Soft, nontender, nondistended.  Extremities: No cyanosis, clubbing, rash, edema Neuro: Alert & oriented x 3, moves all 4 extremities w/o difficulty. Affect pleasant.  ASSESSMENT & PLAN: 1. Chronic Systolic Heart Failure: She has a history of peripartum cardiomyopathy in 2005, EF down to 25-30% w/ later recovery in EF. No CAD on cath at that time. Also took phentermine  for weight loss in the past. Echo 5/25: EF 40-45%, WMA in LAD territory. Echo 9/25: LVEF 25-30%, WMA, RV okay, moderate pericardial effusion. Underwent R/LHC 09/21/24 w/ severe 2V CAD, LVEDP 33, RA 11, PA 55/30 (39), PCWP29, Fick CO/CI 6.12/3.02, PAPi 2.3. Plan for CABG 10/2024. Today, she has NYHA II symptoms. She is not volume overloaded, ReDs 25%. - Continue Lasix 40 mg daily. BMET and BNP today. - Continue Toprol  XL 75 mg daily. - Continue spironolactone  25 mg daily. - Continue Jardiance  25  mg daily. - Hold off on starting Entresto until after CABG to minimize risk of peri-op hypotension - Plan for RHC prior to CABG to reassess filling pressures/hemodynamics.  - Labs  today 2. CAD: Cath 10/25 with severe 2VCAD. TCTS planning CABG 10/26/24. No chest pain. - Continue aspirin  + atorvastatin  80 - Long discussion today regarding what to expect while hospitalized. 3. Tobacco use: remains quit 4. DM II: A1c 8.3% in 9/25 - Continue Jardiance  - Ozempic  on hold pre-op 5. SVT: s/p ablation 06/2024.  - Continue Toprol   Keep follow up next month, post-op with Dr. Rolan, as scheduled.  Harlene Gainer, FNP-BC 10/19/24

## 2024-10-18 ENCOUNTER — Telehealth (HOSPITAL_COMMUNITY): Payer: Self-pay

## 2024-10-18 NOTE — Telephone Encounter (Signed)
 Called to confirm/remind patient of their appointment at the [facility] on 10/19/24.   Appointment:   [x] Confirmed  [] Left mess   [] No answer/No voice mail  [] VM Full/unable to leave message  [] Phone not in service  Patient reminded to bring all medications and/or complete list.  Confirmed patient has transportation. Gave directions, instructed to utilize valet parking.

## 2024-10-19 ENCOUNTER — Ambulatory Visit (HOSPITAL_COMMUNITY)
Admission: RE | Admit: 2024-10-19 | Discharge: 2024-10-19 | Disposition: A | Discharge status: Home / Self Care | Source: Ambulatory Visit | Attending: Family Medicine | Admitting: Family Medicine

## 2024-10-19 ENCOUNTER — Encounter (HOSPITAL_COMMUNITY): Payer: Self-pay

## 2024-10-19 VITALS — BP 126/58 | HR 98 | Ht 68.0 in | Wt 193.8 lb

## 2024-10-19 DIAGNOSIS — Z720 Tobacco use: Secondary | ICD-10-CM

## 2024-10-19 DIAGNOSIS — E119 Type 2 diabetes mellitus without complications: Secondary | ICD-10-CM | POA: Diagnosis not present

## 2024-10-19 DIAGNOSIS — Z87891 Personal history of nicotine dependence: Secondary | ICD-10-CM | POA: Diagnosis not present

## 2024-10-19 DIAGNOSIS — E1165 Type 2 diabetes mellitus with hyperglycemia: Secondary | ICD-10-CM | POA: Diagnosis not present

## 2024-10-19 DIAGNOSIS — F411 Generalized anxiety disorder: Secondary | ICD-10-CM | POA: Diagnosis not present

## 2024-10-19 DIAGNOSIS — I471 Supraventricular tachycardia, unspecified: Secondary | ICD-10-CM | POA: Insufficient documentation

## 2024-10-19 DIAGNOSIS — Z79899 Other long term (current) drug therapy: Secondary | ICD-10-CM | POA: Insufficient documentation

## 2024-10-19 DIAGNOSIS — Z7982 Long term (current) use of aspirin: Secondary | ICD-10-CM | POA: Diagnosis not present

## 2024-10-19 DIAGNOSIS — Z7984 Long term (current) use of oral hypoglycemic drugs: Secondary | ICD-10-CM | POA: Insufficient documentation

## 2024-10-19 DIAGNOSIS — E785 Hyperlipidemia, unspecified: Secondary | ICD-10-CM | POA: Insufficient documentation

## 2024-10-19 DIAGNOSIS — I2510 Atherosclerotic heart disease of native coronary artery without angina pectoris: Secondary | ICD-10-CM | POA: Insufficient documentation

## 2024-10-19 DIAGNOSIS — K219 Gastro-esophageal reflux disease without esophagitis: Secondary | ICD-10-CM | POA: Insufficient documentation

## 2024-10-19 DIAGNOSIS — Z8679 Personal history of other diseases of the circulatory system: Secondary | ICD-10-CM | POA: Insufficient documentation

## 2024-10-19 DIAGNOSIS — I5022 Chronic systolic (congestive) heart failure: Secondary | ICD-10-CM | POA: Diagnosis not present

## 2024-10-19 DIAGNOSIS — I5020 Unspecified systolic (congestive) heart failure: Secondary | ICD-10-CM | POA: Diagnosis not present

## 2024-10-19 DIAGNOSIS — D509 Iron deficiency anemia, unspecified: Secondary | ICD-10-CM | POA: Insufficient documentation

## 2024-10-19 LAB — BASIC METABOLIC PANEL WITH GFR
Anion gap: 11 (ref 5–15)
BUN: 10 mg/dL (ref 6–20)
CO2: 25 mmol/L (ref 22–32)
Calcium: 9.3 mg/dL (ref 8.9–10.3)
Chloride: 100 mmol/L (ref 98–111)
Creatinine, Ser: 0.64 mg/dL (ref 0.44–1.00)
GFR, Estimated: >60 mL/min (ref >60)
Glucose, Bld: 144 mg/dL — ABNORMAL HIGH (ref 70–99)
Potassium: 3.6 mmol/L (ref 3.5–5.1)
Sodium: 136 mmol/L (ref 135–145)

## 2024-10-19 LAB — BRAIN NATRIURETIC PEPTIDE: B Natriuretic Peptide: 344.2 pg/mL — ABNORMAL HIGH (ref 0.0–100.0)

## 2024-10-19 MED ORDER — FUROSEMIDE 40 MG PO TABS: 40.0000 mg | ORAL_TABLET | Freq: Every day | ORAL | 6 refills | Status: DC

## 2024-10-19 NOTE — Progress Notes (Incomplete)
 Cardiology Office Note:   Date:  10/19/2024  ID:  POPPI SCANTLING, DOB 1966/05/26, MRN [id number] PCP: Garald Karlynn GAILS, MD  [facility] Providers Cardiologist:  None { Chief Complaint: No chief complaint on file.     History of Present Illness:   [name] [name] is a 58 y.o. female with a PMH of ICM (EF = 25-30%), remote peripartum CM, severe multivessel CAD, SVT s/p ablation (2025), HLD, DM2, and prior tobacco use disorder who presents for follow up.  I met Mr. Nauta during her recent hospitalization earlier this month where she was diagnosed with new HFrEF.  She underwent LHC which revealed severe multivessel CAD.  She was evaluated by CT surgery and is pending CABG. she establish care with advanced heart failure yesterday and is pending RHC for repeat volume assessment.     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 Cardiac Catheterization 09/21/24: Hemodynamic data: Moderate pulmonary hypertension with markedly elevated LVEDP consistent with WHO group 2 PAH.  RV function is preserved with PAPi 2.3. COP 6.12 andn CI 3.02 by Fick LVEDP 33 mmHg.  There is no pressure gradient across the aortic valve.  Angiographic data: LM: Calcified but widely patent. LAD: Calcified in the proximal segment.  There is moderate diffuse disease in the proximal segment.  There is a proximal 60% stenosis  followed by 80% stenosis distal to the origin of D1 with FFR 0.77, indicating hemodynamically significant stenosis.  Large D1 with proximal 80% stenosis with FFR 0.67, hemodynamically significant.  Distal to apical LAD has 90% focal stenosis. LCx: Gives origin to large OM 2 and OM 3, mild diffuse disease. RCA: Large-caliber vessel, gives origin to large PDA and large PL branch.  Proximal segment has a 99% calcific stenosis.    Impression and recommendations: Patient's LAD and D1 and PDA and PL branches are good targets for CABG.  Once patient is optimized medically from heart failure, would benefit from consideration for surgical revascularization.  Patient was started on Farxiga today. Suspect can be discharged once stable with elective CABG.  Findings Coronary Findings Diagnostic  Dominance: Right  Left Anterior Descending Prox LAD lesion is 60% stenosed. Mid LAD lesion is 80% stenosed. Dist LAD lesion is 90% stenosed.  First Diagonal Branch 1st Diag lesion is 80% stenosed.  Right Coronary Artery Prox RCA lesion is 99% stenosed.  Intervention  No interventions have been documented.     ECHOCARDIOGRAM  ECHOCARDIOGRAM COMPLETE 09/20/2024  Narrative ECHOCARDIOGRAM REPORT    Patient Name:   [name] [name] Date of Exam: 09/20/2024 Medical Rec #:  [id number]       Height:       68.0 in Accession #:    [id number]      Weight:       202.6 lb Date of Birth:  11/20/1966      BSA:  2.055 m Patient Age:    57 years        BP:           115/61 mmHg Patient Gender: F               HR:           89 bpm. Exam Location:  Inpatient  Procedure: 2D Echo and Intracardiac Opacification Agent (Both Spectral and Color Flow Doppler were utilized during procedure).  Indications:    CHF  History:        Patient has prior history of Echocardiogram examinations. CHF and Cardiomyopathy.  Sonographer:    Norleen Amour Referring Phys: [id number] SUBRINA SUNDIL  IMPRESSIONS   1.  Left ventricular ejection fraction, by estimation, is 25 to 30%. The left ventricle has severely decreased function. The left ventricle demonstrates regional wall motion abnormalities (see scoring diagram/findings for description). The left ventricular internal cavity size was mildly to moderately dilated. Left ventricular diastolic parameters are consistent with Grade I diastolic dysfunction (impaired relaxation). 2. Right ventricular systolic function is normal. The right ventricular size is normal. Tricuspid regurgitation signal is inadequate for assessing PA pressure. 3. Left atrial size was mildly dilated. 4. Right atrial size was mildly dilated. 5. Moderate pericardial effusion. The pericardial effusion is circumferential. There is no evidence of cardiac tamponade. 6. The mitral valve is abnormal. Mild mitral valve regurgitation. 7. The aortic valve is abnormal. Aortic valve regurgitation is not visualized. Aortic valve sclerosis/calcification is present, without any evidence of aortic stenosis. 8. The inferior vena cava is normal in size with greater than 50% respiratory variability, suggesting right atrial pressure of 3 mmHg.  Comparison(s): A prior study was performed on 05/15/2024. Prior images reviewed side by side. Left ventricular ejection fraction was 40-45% with akinesis of the distal anteroseptal wall and apex which appear more pronounced on today's study. There was no pericardial effusion or mitral regurgitation noted at that time.  Conclusion(s)/Recommendation(s): Critical findings reported to Georganna Archer.  FINDINGS Left Ventricle: Akinesis of the apex and periapical segments with severe hypokinesis of remaining segments. No thrombus noted. Left ventricular ejection fraction, by estimation, is 25 to 30%. The left ventricle has severely decreased function. The left ventricle demonstrates regional wall motion abnormalities. Definity  contrast agent was given IV to delineate the  left ventricular endocardial borders. The left ventricular internal cavity size was mildly to moderately dilated. There is no left ventricular hypertrophy. Left ventricular diastolic parameters are consistent with Grade I diastolic dysfunction (impaired relaxation).  Right Ventricle: The right ventricular size is normal. Right vetricular wall thickness was not assessed. Right ventricular systolic function is normal. Tricuspid regurgitation signal is inadequate for assessing PA pressure.  Left Atrium: Left atrial size was mildly dilated.  Right Atrium: Right atrial size was mildly dilated.  Pericardium: A moderately sized pericardial effusion is present. The pericardial effusion is circumferential. The pericardial effusion appears to contain coagulum posteriorly. There is no evidence of cardiac tamponade.  Mitral Valve: The mitral valve is abnormal. There is moderate thickening of the mitral valve leaflet(s). Mild mitral valve regurgitation.  Tricuspid Valve: The tricuspid valve is normal in structure. Tricuspid valve regurgitation is mild.  Aortic Valve: The aortic valve is abnormal. Aortic valve regurgitation is not visualized. Aortic valve sclerosis/calcification is present, without any evidence of aortic stenosis. Aortic valve mean gradient measures 7.8 mmHg. Aortic valve peak gradient measures 15.4 mmHg. Aortic valve area, by VTI measures 1.27 cm.  Pulmonic Valve: The pulmonic valve was normal  in structure. Pulmonic valve regurgitation is trivial.  Aorta: The aortic root and ascending aorta are structurally normal, with no evidence of dilitation.  Venous: The inferior vena cava is normal in size with greater than 50% respiratory variability, suggesting right atrial pressure of 3 mmHg.  IAS/Shunts: The interatrial septum was not well visualized.  Additional Comments: There is a small pleural effusion.   LEFT VENTRICLE PLAX 2D LVIDd:         6.50 cm      Diastology LVIDs:          4.40 cm      LV e' medial:    7.62 cm/s LV PW:         0.90 cm      LV E/e' medial:  17.1 LV IVS:        0.90 cm      LV e' lateral:   8.27 cm/s LVOT diam:     2.00 cm      LV E/e' lateral: 15.7 LV SV:         46 LV SV Index:   22 LVOT Area:     3.14 cm  LV Volumes (MOD) LV vol d, MOD A2C: 210.0 ml LV vol d, MOD A4C: 186.0 ml LV vol s, MOD A2C: 148.0 ml LV vol s, MOD A4C: 137.0 ml LV SV MOD A2C:     62.0 ml LV SV MOD A4C:     186.0 ml LV SV MOD BP:      55.6 ml  RIGHT VENTRICLE             IVC RV Basal diam:  3.00 cm     IVC diam: 1.80 cm RV S prime:     12.80 cm/s TAPSE (M-mode): 2.0 cm  LEFT ATRIUM             Index        RIGHT ATRIUM           Index LA diam:        3.90 cm 1.90 cm/m   RA Area:     16.30 cm LA Vol (A2C):   57.6 ml 28.03 ml/m  RA Volume:   45.20 ml  22.00 ml/m LA Vol (A4C):   53.4 ml 25.99 ml/m LA Biplane Vol: 55.8 ml 27.15 ml/m AORTIC VALVE                     PULMONIC VALVE AV Area (Vmax):    1.39 cm      PV Vmax:       0.91 m/s AV Area (Vmean):   1.33 cm      PV Peak grad:  3.3 mmHg AV Area (VTI):     1.27 cm AV Vmax:           196.25 cm/s AV Vmean:          126.250 cm/s AV VTI:            0.363 m AV Peak Grad:      15.4 mmHg AV Mean Grad:      7.8 mmHg LVOT Vmax:         87.00 cm/s LVOT Vmean:        53.500 cm/s LVOT VTI:          0.147 m LVOT/AV VTI ratio: 0.40  AORTA Ao Root diam: 2.40 cm Ao Asc diam:  3.10 cm  MITRAL VALVE MV Area (PHT): 4.57 cm  SHUNTS MV Decel Time: 166 msec     Systemic VTI:  0.15 m MV E velocity: 130.00 cm/s  Systemic Diam: 2.00 cm MV A velocity: 105.00 cm/s MV E/A ratio:  1.24  Emeline Calender Electronically signed by Emeline Calender Signature Date/Time: 09/20/2024/2:01:37 PM    Final      CT SCANS  CT CORONARY MORPH W/CTA COR W/SCORE 06/22/2024  Addendum 06/22/2024  7:03 PM ADDENDUM REPORT: 06/22/2024 19:00  EXAM: OVER-READ INTERPRETATION  CT CHEST  The following report is an over-read performed  by radiologist Dr. Fonda Mom [facility] on 06/22/2024. This over-read does not include interpretation of cardiac or coronary anatomy or pathology. The coronary CTA interpretation by the cardiologist is attached.  COMPARISON:  06/15/2004.  FINDINGS: Cardiovascular:  See findings discussed in the body of the report.  Mediastinum/Nodes: No suspicious adenopathy identified. Imaged mediastinal structures are unremarkable.  Lungs/Pleura: Imaged lungs are clear. No pleural effusion or pneumothorax.  Upper Abdomen: No acute abnormality.  Musculoskeletal: No chest wall abnormality. No acute osseous findings. There are thoracic degenerative changes.  IMPRESSION: No acute extracardiac incidental findings.   Electronically Signed By: Fonda Field M.D. On: 06/22/2024 19:00  Narrative CLINICAL DATA:  58 yo female with chest pain, nonspecific LV dysfunction  EXAM: Cardiac/Coronary CTA  TECHNIQUE: A non-contrast, gated CT scan was obtained with axial slices of 2.5 mm through the heart for calcium  scoring. Calcium  scoring was performed using the Agatston method. A 120 kV prospective, gated, contrast cardiac CT scan was obtained. Gantry rotation speed was 230 msec and collimation was 0.63 mm. Two sublingual nitroglycerin  tablets (0.8 mg) were given. The 3D data set was reconstructed with motion correction for the best systolic or diastolic phase. Images were analyzed on a dedicated workstation using MPR, MIP, and VRT modes. The patient received 100mL OMNIPAQUE  IOHEXOL  350 MG/ML SOLN of contrast.  FINDINGS: Image quality: Excellent.  Noise artifact is: Limited.  Coronary arteries: Normal coronary origins.  Right dominance.  Right Coronary Artery: Dominant. Heavily calcified with at least moderate (50-69%) to possibly severe proximal stenosis. Small R-PLB and R-PDA branches which are calcified.  Left Main Coronary Artery: Calcified with at least mild  25-49% stenosis. Bifurcates into the LAD and LCx arteries.  Left Anterior Descending Coronary Artery: Large anterior artery that wraps around the apex. Heavily calcified with at least moderate 50-69% to possibly severe proximal mixed stenosis with low attenuation plaque features. The mid to distal vessel demonstrates spotty calcification with up to mild 25-49% stenosis.  Left Circumflex Artery: AV groove vessel with moderate concentric calcification of the mid vessel and the bifurcation of an OM branch. The OM branch does not appear diseased. The distal LCx tapers sharply consistent with severe 70-99% stenosis.  Aorta: Normal size, 32 mm at the mid ascending aorta (level of the PA bifurcation) measured double oblique. Aortic atherosclerosis. No dissection.  Aortic Valve: Trileaflet. Mild calcifications (Aortic valve calcium  score of 95.4)  Other findings:  Normal pulmonary vein drainage into the left atrium.  Normal left atrial appendage without a thrombus.  Mildly dilated main pulmonary artery at 30 mm, suggestive of pulmonary hypertension.  Extra-cardiac findings: See attached radiology report for non-cardiac structures.  IMPRESSION: 1. Heavy coronary calcification with moderate to likely severe CAD, CADRADS = 4v. CT FFR will be performed and reported separately.  2. Normal coronary origin with right dominance.  3. Coronary artery calcium  score is 2781, which places the patient in the 99th percentile for age/race and sex-matched controls (MESA).  4. Total plaque volume 2026 mm3, of which 690 mm3 (34%) is calcified plaque and 1353mm3 (66%) is non-calcified plaque. 18 mm3 (1%) is considered low attenuation. TPV is extensive.  5. Aggressvie risk factor modification, including lipid lowering to LDL <50 if possible is recommended. Consider anti-inflammatory therapies such as colchicine and/or icosapent ethyl.  6. Cardiac catheterization may be  warranted.  RECOMMENDATIONS: 1. CAD-RADS 0: No evidence of CAD (0%). Consider non-atherosclerotic causes of chest pain.  2. CAD-RADS 1: Minimal non-obstructive CAD (0-24%). Consider non-atherosclerotic causes of chest pain. Consider preventive therapy and risk factor modification.  3. CAD-RADS 2: Mild non-obstructive CAD (25-49%). Consider non-atherosclerotic causes of chest pain. Consider preventive therapy and risk factor modification.  4. CAD-RADS 3: Moderate stenosis. Consider symptom-guided anti-ischemic pharmacotherapy as well as risk factor modification per guideline directed care. Additional analysis with CT FFR will be submitted.  5. CAD-RADS 4: Severe stenosis. (70-99% or > 50% left main). Cardiac catheterization or CT FFR is recommended. Consider symptom-guided anti-ischemic pharmacotherapy as well as risk factor modification per guideline directed care. Invasive coronary angiography recommended with revascularization per published guideline statements.  6. CAD-RADS 5: Total coronary occlusion (100%). Consider cardiac catheterization or viability assessment. Consider symptom-guided anti-ischemic pharmacotherapy as well as risk factor modification per guideline directed care.  7. CAD-RADS N: Non-diagnostic study. Obstructive CAD can't be excluded. Alternative evaluation is recommended.  Electronically Signed: By: Vinie JAYSON Maxcy M.D. On: 06/22/2024 12:28     ______________________________________________________________________________________________      Risk Assessment/Calculations:   {Does this patient have ATRIAL FIBRILLATION?:[phone]}          Physical Exam:     VS:  LMP 03/02/2017  ***    Wt Readings from Last 3 Encounters:  10/19/24 193 lb 12.8 oz (87.9 kg)  10/03/24 189 lb 3.2 oz (85.8 kg)  09/27/24 192 lb 3.2 oz (87.2 kg)     GEN: Well nourished, well developed, in no acute distress NECK: No JVD; No carotid bruits CARDIAC: ***RRR, no  murmurs, rubs, gallops RESPIRATORY:  Clear to auscultation without rales, wheezing or rhonchi  ABDOMEN: Soft, non-tender, non-distended, normal bowel sounds EXTREMITIES:  Warm and well perfused, no edema; No deformity, 2+ radial pulses PSYCH: Normal mood and affect   Assessment & Plan Chronic heart failure with reduced ejection fraction (HFrEF, <= 40%) (HCC) - Holding Entresto preprocedure Mixed hyperlipidemia -lipid panel fasting in 2 months for triglycerides Coronary artery disease involving native coronary artery of native heart without angina pectoris       {Are you ordering a CV Procedure (e.g. stress test, cath, DCCV, TEE, etc)?   Press F2        :[id number]}   This note was written with the assistance of a dictation microphone or AI dictation software. Please excuse any typos or grammatical errors.   Signed, Georganna Archer, MD 10/19/2024 6:36 PM    [facility]

## 2024-10-19 NOTE — Assessment & Plan Note (Signed)
 Will need lipid panel fasting in 2 months for triglycerides.  She plans to get these drawn by her PCP. -Continue current lipid-lowering management

## 2024-10-19 NOTE — Progress Notes (Signed)
 ReDS Vest / Clip - 10/19/24 1507       ReDS Vest / Clip   Station Marker D    Ruler Value 39    ReDS Value Range Low volume    ReDS Actual Value 25

## 2024-10-19 NOTE — Patient Instructions (Addendum)
 Medication Changes:  None, continue current medications  Lab Work:  Labs done today, your results will be available in MyChart, we will contact you for abnormal readings.   Special Instructions // Education:  Do the following things EVERYDAY: Weigh yourself in the morning before breakfast. Write it down and keep it in a log. Take your medicines as prescribed Eat low salt foods--Limit salt (sodium) to 2000 mg per day.  Stay as active as you can everyday Limit all fluids for the day to less than 2 liters   Follow-Up in: AS SCHEDULED (Wed 11/16/24 at 1:40 pm)   At the [facility], you and your health needs are our priority. We have a designated team specialized in the treatment of Heart Failure. This Care Team includes your primary Heart Failure Specialized Cardiologist (physician), Advanced Practice Providers (APPs- Physician Assistants and Nurse Practitioners), and Pharmacist who all work together to provide you with the care you need, when you need it.   You may see any of the following providers on your designated Care Team at your next follow up:  Dr. Toribio Fuel Dr. Ezra Shuck Dr. Odis Brownie Greig Mosses, NP Caffie Shed, [place] [facility] [place], [place] Beckey Coe, NP Jordan Lee, NP Tinnie Redman, PharmD   Please be sure to bring in all your medications bottles to every appointment.   Need to Contact Us :  If you have any questions or concerns before your next appointment please send us  a message through [place] or call our office at [phone].    TO LEAVE A MESSAGE FOR THE NURSE SELECT OPTION 2, PLEASE LEAVE A MESSAGE INCLUDING: YOUR NAME DATE OF BIRTH CALL BACK NUMBER REASON FOR CALL**this is important as we prioritize the call backs  YOU WILL RECEIVE A CALL BACK THE SAME DAY AS LONG AS YOU CALL BEFORE 4:00 PM

## 2024-10-19 NOTE — Assessment & Plan Note (Signed)
 Patient appears euvolemic on examination today.  Plan to undergo an RHC next week with heart failure and CABG surgery.  I am optimistic that she will have some regain in her systolic function after she is revascularized and gets on maximally tolerated GDMT for at least 3 months.  She will have heart failure with CT surgery and heart failure.  To minimize her office burden I will plan to follow-up with her in 4-6 months. - Holding Entresto preprocedure -Continue metoprolol  succinate, spironolactone , empagliflozin  (a part of Glyxambi ) -Continue follow-up with HF clinic

## 2024-10-20 ENCOUNTER — Ambulatory Visit: Attending: Internal Medicine | Admitting: Student in an Organized Health Care Education/Training Program

## 2024-10-20 ENCOUNTER — Encounter: Payer: Self-pay | Admitting: Student in an Organized Health Care Education/Training Program

## 2024-10-20 VITALS — BP 108/52 | HR 103 | Ht 68.0 in | Wt 190.0 lb

## 2024-10-20 DIAGNOSIS — I2510 Atherosclerotic heart disease of native coronary artery without angina pectoris: Secondary | ICD-10-CM

## 2024-10-20 DIAGNOSIS — I5022 Chronic systolic (congestive) heart failure: Secondary | ICD-10-CM

## 2024-10-20 DIAGNOSIS — E782 Mixed hyperlipidemia: Secondary | ICD-10-CM | POA: Diagnosis not present

## 2024-10-20 NOTE — Patient Instructions (Signed)
   Follow-Up: At [facility], you and your health needs are our priority.  As part of our continuing mission to provide you with exceptional heart care, our providers are all part of one team.  This team includes your primary Cardiologist (physician) and Advanced Practice Providers or APPs (Physician Assistants and Nurse Practitioners) who all work together to provide you with the care you need, when you need it.  Your next appointment:   4-5 month(s)  Provider:   Georganna Archer, MD

## 2024-10-20 NOTE — Assessment & Plan Note (Signed)
 Planning for CABG next week.  No changes to CAD regimen at this time.

## 2024-10-21 ENCOUNTER — Ambulatory Visit (HOSPITAL_COMMUNITY): Payer: Self-pay | Admitting: Family Medicine

## 2024-10-24 ENCOUNTER — Other Ambulatory Visit: Payer: Self-pay | Admitting: *Deleted

## 2024-10-24 ENCOUNTER — Other Ambulatory Visit: Payer: Self-pay

## 2024-10-24 ENCOUNTER — Ambulatory Visit (HOSPITAL_COMMUNITY)
Admission: RE | Admit: 2024-10-24 | Discharge: 2024-10-24 | Disposition: A | Discharge status: Home / Self Care | Attending: Cardiology | Admitting: Cardiology

## 2024-10-24 ENCOUNTER — Encounter (HOSPITAL_COMMUNITY)
Admission: RE | Disposition: A | Discharge status: Home / Self Care | Payer: Self-pay | Source: Home / Self Care | Attending: Cardiology

## 2024-10-24 DIAGNOSIS — Z01810 Encounter for preprocedural cardiovascular examination: Secondary | ICD-10-CM | POA: Insufficient documentation

## 2024-10-24 DIAGNOSIS — Z7985 Long-term (current) use of injectable non-insulin antidiabetic drugs: Secondary | ICD-10-CM | POA: Insufficient documentation

## 2024-10-24 DIAGNOSIS — I5022 Chronic systolic (congestive) heart failure: Secondary | ICD-10-CM | POA: Insufficient documentation

## 2024-10-24 DIAGNOSIS — I5020 Unspecified systolic (congestive) heart failure: Secondary | ICD-10-CM

## 2024-10-24 DIAGNOSIS — I6523 Occlusion and stenosis of bilateral carotid arteries: Secondary | ICD-10-CM | POA: Insufficient documentation

## 2024-10-24 DIAGNOSIS — F411 Generalized anxiety disorder: Secondary | ICD-10-CM | POA: Insufficient documentation

## 2024-10-24 DIAGNOSIS — K219 Gastro-esophageal reflux disease without esophagitis: Secondary | ICD-10-CM | POA: Insufficient documentation

## 2024-10-24 DIAGNOSIS — I2510 Atherosclerotic heart disease of native coronary artery without angina pectoris: Secondary | ICD-10-CM

## 2024-10-24 DIAGNOSIS — I509 Heart failure, unspecified: Secondary | ICD-10-CM | POA: Diagnosis not present

## 2024-10-24 DIAGNOSIS — Z7982 Long term (current) use of aspirin: Secondary | ICD-10-CM | POA: Insufficient documentation

## 2024-10-24 DIAGNOSIS — I471 Supraventricular tachycardia, unspecified: Secondary | ICD-10-CM | POA: Insufficient documentation

## 2024-10-24 DIAGNOSIS — E785 Hyperlipidemia, unspecified: Secondary | ICD-10-CM | POA: Insufficient documentation

## 2024-10-24 DIAGNOSIS — D509 Iron deficiency anemia, unspecified: Secondary | ICD-10-CM | POA: Insufficient documentation

## 2024-10-24 DIAGNOSIS — Z79899 Other long term (current) drug therapy: Secondary | ICD-10-CM | POA: Insufficient documentation

## 2024-10-24 DIAGNOSIS — Z7984 Long term (current) use of oral hypoglycemic drugs: Secondary | ICD-10-CM | POA: Insufficient documentation

## 2024-10-24 DIAGNOSIS — I2721 Secondary pulmonary arterial hypertension: Secondary | ICD-10-CM | POA: Insufficient documentation

## 2024-10-24 DIAGNOSIS — Z87891 Personal history of nicotine dependence: Secondary | ICD-10-CM | POA: Insufficient documentation

## 2024-10-24 DIAGNOSIS — E119 Type 2 diabetes mellitus without complications: Secondary | ICD-10-CM | POA: Insufficient documentation

## 2024-10-24 HISTORY — PX: RIGHT HEART CATH: CATH118263

## 2024-10-24 LAB — POCT I-STAT EG7
Acid-Base Excess: 0 mmol/L (ref 0.0–2.0)
Acid-base deficit: 1 mmol/L (ref 0.0–2.0)
Bicarbonate: 24.5 mmol/L (ref 20.0–28.0)
Bicarbonate: 25 mmol/L (ref 20.0–28.0)
Calcium, Ion: 1.2 mmol/L (ref 1.15–1.40)
Calcium, Ion: 1.22 mmol/L (ref 1.15–1.40)
HCT: 31 % — ABNORMAL LOW (ref 36.0–46.0)
HCT: 32 % — ABNORMAL LOW (ref 36.0–46.0)
Hemoglobin: 10.5 g/dL — ABNORMAL LOW (ref 12.0–15.0)
Hemoglobin: 10.9 g/dL — ABNORMAL LOW (ref 12.0–15.0)
O2 Saturation: 62 %
O2 Saturation: 62 %
Potassium: 4 mmol/L (ref 3.5–5.1)
Potassium: 4 mmol/L (ref 3.5–5.1)
Sodium: 140 mmol/L (ref 135–145)
Sodium: 141 mmol/L (ref 135–145)
TCO2: 26 mmol/L (ref 22–32)
TCO2: 26 mmol/L (ref 22–32)
pCO2, Ven: 40.8 mmHg — ABNORMAL LOW (ref 44–60)
pCO2, Ven: 41.9 mmHg — ABNORMAL LOW (ref 44–60)
pH, Ven: 7.385 (ref 7.25–7.43)
pH, Ven: 7.383 (ref 7.25–7.43)
pO2, Ven: 32 mmHg (ref 32–45)
pO2, Ven: 33 mmHg (ref 32–45)

## 2024-10-24 LAB — GLUCOSE, CAPILLARY: Glucose-Capillary: 109 mg/dL — ABNORMAL HIGH (ref 70–99)

## 2024-10-24 SURGERY — RIGHT HEART CATH
Anesthesia: LOCAL

## 2024-10-24 MED ORDER — SODIUM CHLORIDE 0.9 % IV SOLN: 250.0000 mL | INTRAVENOUS | Status: DC | PRN

## 2024-10-24 MED ORDER — POTASSIUM CHLORIDE ER 8 MEQ PO TBCR: 8.0000 meq | EXTENDED_RELEASE_TABLET | Freq: Four times a day (QID) | ORAL | 5 refills | Status: AC

## 2024-10-24 MED ORDER — SODIUM CHLORIDE 0.9% FLUSH: 3.0000 mL | INTRAVENOUS | Status: DC | PRN

## 2024-10-24 MED ORDER — SODIUM CHLORIDE 0.9% FLUSH: 3.0000 mL | Freq: Two times a day (BID) | INTRAVENOUS | Status: DC

## 2024-10-24 MED ORDER — FREE WATER: 250.0000 mL | Freq: Once | Status: DC

## 2024-10-24 MED ORDER — ~~LOC~~ CARDIAC SURGERY, PATIENT & FAMILY EDUCATION: Freq: Once | Status: AC

## 2024-10-24 MED ORDER — HYDRALAZINE HCL 20 MG/ML IJ SOLN: 10.0000 mg | INTRAMUSCULAR | Status: DC | PRN

## 2024-10-24 MED ORDER — ONDANSETRON HCL 4 MG/2ML IJ SOLN: 4.0000 mg | Freq: Four times a day (QID) | INTRAMUSCULAR | Status: DC | PRN

## 2024-10-24 MED ORDER — ACETAMINOPHEN 325 MG PO TABS: 650.0000 mg | ORAL_TABLET | ORAL | Status: DC | PRN

## 2024-10-24 MED ORDER — LABETALOL HCL 5 MG/ML IV SOLN: 10.0000 mg | INTRAVENOUS | Status: DC | PRN

## 2024-10-24 MED ORDER — FUROSEMIDE 40 MG PO TABS: 60.0000 mg | ORAL_TABLET | Freq: Every day | ORAL | 6 refills | Status: DC

## 2024-10-24 MED ORDER — HEPARIN (PORCINE) IN NACL 1000-0.9 UT/500ML-% IV SOLN
INTRAVENOUS | Status: DC | PRN
  Administered 2024-10-24 (×2): 500 mL

## 2024-10-24 MED ORDER — LIDOCAINE HCL (PF) 1 % IJ SOLN
INTRAMUSCULAR | Status: DC | PRN
  Administered 2024-10-24: 2 mL via INTRADERMAL

## 2024-10-24 MED ORDER — LIDOCAINE HCL (PF) 1 % IJ SOLN
INTRAMUSCULAR | Status: AC
  Filled 2024-10-24: qty 30

## 2024-10-24 MED ORDER — POTASSIUM CHLORIDE CRYS ER 20 MEQ PO TBCR
20.0000 meq | EXTENDED_RELEASE_TABLET | Freq: Every day | ORAL | 6 refills | Status: DC
Start: 2024-10-24 — End: 2024-10-24

## 2024-10-24 SURGICAL SUPPLY — 6 items
CATH SWAN GANZ 7F STRAIGHT (CATHETERS) IMPLANT
GLIDESHEATH SLENDER 7FR .021G (SHEATH) IMPLANT
KIT SYRINGE INJ CVI SPIKEX1 (MISCELLANEOUS) IMPLANT
PACK CARDIAC CATHETERIZATION (CUSTOM PROCEDURE TRAY) ×1 IMPLANT
TRANSDUCER W/STOPCOCK (MISCELLANEOUS) IMPLANT
TUBING ART PRESS 72 MALE/FEM (TUBING) IMPLANT

## 2024-10-24 NOTE — Pre-Procedure Instructions (Signed)
 Surgical Instructions   Your procedure is scheduled on Wednesday, November 5th. Report to [facility] Main Entrance A at 06:30 A.M., then check in with the Admitting office. Any questions or running late day of surgery: call [phone]  Questions prior to your surgery date: call [phone], Monday-Friday, 8am-4pm. If you experience any cold or flu symptoms such as cough, fever, chills, shortness of breath, etc. between now and your scheduled surgery, please notify us  at the above number.     Remember:  Do not eat or drink after midnight the night before your surgery    Take these medicines the morning of surgery with A SIP OF WATER  ALPRAZolam  (XANAX )  atorvastatin  (LIPITOR)  buPROPion  (WELLBUTRIN  SR)  citalopram  (CELEXA )  metoprolol  succinate (TOPROL -XL)  pantoprazole  (PROTONIX )     May take these medicines IF NEEDED: Albuterol -Budesonide (AIRSUPRA )  gabapentin  (NEURONTIN )  nitroGLYCERIN  (NITROSTAT )- If you have to take this medication prior to surgery, please call [phone] and report this to a nurse     STOP taking Ozempic  1 week prior to surgery. Last dose 10/26. STOP taking Entresto 3 days prior to surgery. Last dose 11/1. STOP taking Metformin  2 days prior to surgery. Last dose 11/2.   One week prior to surgery, STOP taking any Aleve , Naproxen , Ibuprofen, Motrin, Advil, Goody's, BC's, all herbal medications, fish oil, and non-prescription vitamins.  WHAT DO I DO ABOUT MY DIABETES MEDICATION?   Do not take metFORMIN  (GLUCOPHAGE ) OR repaglinide  (PRANDIN ) the morning of surgery.  STOP taking GLYXAMBI  72 hours prior to surgery. Last dose 11/1.       HOW TO MANAGE YOUR DIABETES BEFORE AND AFTER SURGERY  Why is it important to control my blood sugar before and after surgery? Improving blood sugar levels before and after surgery helps healing and can limit problems. A way of improving blood sugar control is eating a healthy diet by:  Eating less sugar and  carbohydrates  Increasing activity/exercise  Talking with your doctor about reaching your blood sugar goals High blood sugars (greater than 180 mg/dL) can raise your risk of infections and slow your recovery, so you will need to focus on controlling your diabetes during the weeks before surgery. Make sure that the doctor who takes care of your diabetes knows about your planned surgery including the date and location.  How do I manage my blood sugar before surgery? Check your blood sugar at least 4 times a day, starting 2 days before surgery, to make sure that the level is not too high or low.  Check your blood sugar the morning of your surgery when you wake up and every 2 hours until you get to the Short Stay unit.  If your blood sugar is less than 70 mg/dL, you will need to treat for low blood sugar: Do not take insulin . Treat a low blood sugar (less than 70 mg/dL) with  cup of clear juice (cranberry or apple), 4 glucose tablets, OR glucose gel. Recheck blood sugar in 15 minutes after treatment (to make sure it is greater than 70 mg/dL). If your blood sugar is not greater than 70 mg/dL on recheck, call [phone] for further instructions. Report your blood sugar to the short stay nurse when you get to Short Stay.  If you are admitted to the hospital after surgery: Your blood sugar will be checked by the staff and you will probably be given insulin  after surgery (instead of oral diabetes medicines) to make sure you have good blood sugar levels. The goal  for blood sugar control after surgery is 80-180 mg/dL.                     Do NOT Smoke (Tobacco/Vaping) for 24 hours prior to your procedure.  If you use a CPAP at night, you may bring your mask/headgear for your overnight stay.   You will be asked to remove any contacts, glasses, piercing's, hearing aid's, dentures/partials prior to surgery. Please bring cases for these items if needed.    Patients discharged the day of surgery will  not be allowed to drive home, and someone needs to stay with them for 24 hours.  SURGICAL WAITING ROOM VISITATION Patients may have no more than 2 support people in the waiting area - these visitors may rotate.   Pre-op nurse will coordinate an appropriate time for 1 ADULT support person, who may not rotate, to accompany patient in pre-op.  Children under the age of 77 must have an adult with them who is not the patient and must remain in the main waiting area with an adult.  If the patient needs to stay at the hospital during part of their recovery, the visitor guidelines for inpatient rooms apply.  Please refer to the [facility] website for the visitor guidelines for any additional information.   If you received a COVID test during your pre-op visit  it is requested that you wear a mask when out in public, stay away from anyone that may not be feeling well and notify your surgeon if you develop symptoms. If you have been in contact with anyone that has tested positive in the last 10 days please notify you surgeon.      Pre-operative CHG Bathing Instructions   You can play a key role in reducing the risk of infection after surgery. Your skin needs to be as free of germs as possible. You can reduce the number of germs on your skin by washing with CHG (chlorhexidine gluconate) soap before surgery. CHG is an antiseptic soap that kills germs and continues to kill germs even after washing.   DO NOT use if you have an allergy to chlorhexidine/CHG or antibacterial soaps. If your skin becomes reddened or irritated, stop using the CHG and notify one of our RNs at [phone].              TAKE A SHOWER THE NIGHT BEFORE SURGERY   Please keep in mind the following:  DO NOT shave, including legs and underarms, 48 hours prior to surgery.   You may shave your face before/day of surgery.  Place clean sheets on your bed the night before surgery Use a clean washcloth (not used since being washed) for  shower. DO NOT sleep with pet's night before surgery.  CHG Shower Instructions:  Wash your face and private area with normal soap. If you choose to wash your hair, wash first with your normal shampoo.  After you use shampoo/soap, rinse your hair and body thoroughly to remove shampoo/soap residue.  Turn the water OFF and apply half the bottle of CHG soap to a CLEAN washcloth.  Apply CHG soap ONLY FROM YOUR NECK DOWN TO YOUR TOES (washing for 3-5 minutes)  DO NOT use CHG soap on face, private areas, open wounds, or sores.  Pay special attention to the area where your surgery is being performed.  If you are having back surgery, having someone wash your back for you may be helpful. Wait 2 minutes after CHG soap is  applied, then you may rinse off the CHG soap.  Pat dry with a clean towel  Put on clean pajamas    Additional instructions for the day of surgery: If you choose, you may shower the morning of surgery with an antibacterial soap.  DO NOT APPLY any lotions, deodorants, cologne, or perfumes.   Do not wear jewelry or makeup Do not wear nail polish, gel polish, artificial nails, or any other type of covering on natural nails (fingers and toes) Do not bring valuables to the hospital. [facility] is not responsible for valuables/personal belongings. Put on clean/comfortable clothes.  Please brush your teeth.  Ask your nurse before applying any prescription medications to the skin.

## 2024-10-24 NOTE — Interval H&P Note (Signed)
 History and Physical Interval Note:  10/24/2024 7:33 AM  [name] [name]  has presented today for surgery, with the diagnosis of pre op CABG.  The various methods of treatment have been discussed with the patient and family. After consideration of risks, benefits and other options for treatment, the patient has consented to  Procedure(s): RIGHT HEART CATH (N/A) as a surgical intervention.  The patient's history has been reviewed, patient examined, no change in status, stable for surgery.  I have reviewed the patient's chart and labs.  Questions were answered to the patient's satisfaction.     Rin Gorton [employer]

## 2024-10-24 NOTE — Discharge Instructions (Addendum)
 Brachial Site Care   This sheet gives you information about how to care for yourself after your procedure. Your health care provider may also give you more specific instructions. If you have problems or questions, contact your health care provider. What can I expect after the procedure? After the procedure, it is common to have: Bruising and tenderness at the catheter insertion area. Follow these instructions at home:  Insertion site care Follow instructions from your health care provider about how to take care of your insertion site. Make sure you: Wash your hands with soap and water before you change your bandage (dressing). If soap and water are not available, use hand sanitizer. Remove your dressing as told by your health care provider. In 24 hours Check your insertion site every day for signs of infection. Check for: Redness, swelling, or pain. Pus or a bad smell. Warmth. You may shower 24-48 hours after the procedure. Do not apply powder or lotion to the site.  Activity For 24 hours after the procedure, or as directed by your health care provider: Do not push or pull heavy objects with the affected arm. Do not drive yourself home from the hospital or clinic. You may drive 24 hours after the procedure unless your health care provider tells you not to. Do not lift anything that is heavier than 10 lb (4.5 kg), or the limit that you are told, until your health care provider says that it is safe.  For 24 hours   Increase furosemide (lasix) to 60 mg daily (1.5 tabs) and increase potassium to 4 tabs daily.

## 2024-10-24 NOTE — Progress Notes (Signed)
 Patient was given discharge instructions. She verbalized understanding.

## 2024-10-25 ENCOUNTER — Other Ambulatory Visit: Payer: Self-pay

## 2024-10-25 ENCOUNTER — Ambulatory Visit (HOSPITAL_COMMUNITY)
Admission: RE | Admit: 2024-10-25 | Discharge: 2024-10-25 | Disposition: A | Discharge status: Home / Self Care | Source: Home / Self Care | Attending: Cardiology | Admitting: Cardiology

## 2024-10-25 ENCOUNTER — Encounter (HOSPITAL_COMMUNITY): Payer: Self-pay | Admitting: Thoracic Surgery (Cardiothoracic Vascular Surgery)

## 2024-10-25 ENCOUNTER — Encounter (HOSPITAL_COMMUNITY): Payer: Self-pay | Admitting: Cardiology

## 2024-10-25 ENCOUNTER — Ambulatory Visit (HOSPITAL_BASED_OUTPATIENT_CLINIC_OR_DEPARTMENT_OTHER)
Admission: RE | Admit: 2024-10-25 | Discharge: 2024-10-25 | Disposition: A | Discharge status: Home / Self Care | Source: Home / Self Care | Attending: Thoracic Surgery (Cardiothoracic Vascular Surgery) | Admitting: Thoracic Surgery (Cardiothoracic Vascular Surgery)

## 2024-10-25 ENCOUNTER — Ambulatory Visit (HOSPITAL_COMMUNITY)
Admission: RE | Admit: 2024-10-25 | Discharge: 2024-10-25 | Disposition: A | Discharge status: Home / Self Care | Source: Ambulatory Visit | Attending: Thoracic Surgery (Cardiothoracic Vascular Surgery) | Admitting: Thoracic Surgery (Cardiothoracic Vascular Surgery)

## 2024-10-25 VITALS — BP 113/63 | HR 99 | Temp 98.2°F | Resp 20 | Ht 68.0 in | Wt 184.3 lb

## 2024-10-25 DIAGNOSIS — I2583 Coronary atherosclerosis due to lipid rich plaque: Secondary | ICD-10-CM

## 2024-10-25 DIAGNOSIS — I2510 Atherosclerotic heart disease of native coronary artery without angina pectoris: Secondary | ICD-10-CM | POA: Diagnosis not present

## 2024-10-25 DIAGNOSIS — Z01818 Encounter for other preprocedural examination: Secondary | ICD-10-CM

## 2024-10-25 HISTORY — DX: Family history of other specified conditions: Z84.89

## 2024-10-25 LAB — COMPREHENSIVE METABOLIC PANEL WITH GFR
ALT: 13 U/L (ref 0–44)
AST: 13 U/L — ABNORMAL LOW (ref 15–41)
Albumin: 3.5 g/dL (ref 3.5–5.0)
Alkaline Phosphatase: 96 U/L (ref 38–126)
Anion gap: 11 (ref 5–15)
BUN: 10 mg/dL (ref 6–20)
CO2: 29 mmol/L (ref 22–32)
Calcium: 9 mg/dL (ref 8.9–10.3)
Chloride: 101 mmol/L (ref 98–111)
Creatinine, Ser: 0.74 mg/dL (ref 0.44–1.00)
GFR, Estimated: >60 mL/min (ref >60)
Glucose, Bld: 83 mg/dL (ref 70–99)
Potassium: 3.7 mmol/L (ref 3.5–5.1)
Sodium: 141 mmol/L (ref 135–145)
Total Bilirubin: 1.1 mg/dL (ref 0.0–1.2)
Total Protein: 7.7 g/dL (ref 6.5–8.1)

## 2024-10-25 LAB — URINALYSIS, ROUTINE W REFLEX MICROSCOPIC
Bilirubin Urine: NEGATIVE
Glucose, UA: >=500 mg/dL — AB
Hgb urine dipstick: NEGATIVE
Ketones, ur: NEGATIVE mg/dL
Leukocytes,Ua: NEGATIVE
Nitrite: NEGATIVE
Protein, ur: NEGATIVE mg/dL
Specific Gravity, Urine: 1.014 (ref 1.005–1.030)
pH: 5 (ref 5.0–8.0)

## 2024-10-25 LAB — CBC
HCT: 42.1 % (ref 36.0–46.0)
Hemoglobin: 12.9 g/dL (ref 12.0–15.0)
MCH: 25.9 pg — ABNORMAL LOW (ref 26.0–34.0)
MCHC: 30.6 g/dL (ref 30.0–36.0)
MCV: 84.4 fL (ref 80.0–100.0)
Platelets: 325 K/uL (ref 150–400)
RBC: 4.99 MIL/uL (ref 3.87–5.11)
RDW: 15.2 % (ref 11.5–15.5)
WBC: 11.2 K/uL — ABNORMAL HIGH (ref 4.0–10.5)
nRBC: 0 % (ref 0.0–0.2)

## 2024-10-25 LAB — TYPE AND SCREEN
ABO/RH(D): A POS
Antibody Screen: NEGATIVE

## 2024-10-25 LAB — PROTIME-INR
INR: 1 (ref 0.8–1.2)
Prothrombin Time: 13.8 s (ref 11.4–15.2)

## 2024-10-25 LAB — VAS US DOPPLER PRE CABG

## 2024-10-25 LAB — SURGICAL PCR SCREEN
MRSA, PCR: NEGATIVE
Staphylococcus aureus: NEGATIVE

## 2024-10-25 LAB — APTT: aPTT: 31 s (ref 24–36)

## 2024-10-25 LAB — HEMOGLOBIN A1C
Hgb A1c MFr Bld: 6.5 % — ABNORMAL HIGH (ref 4.8–5.6)
Mean Plasma Glucose: 139.85 mg/dL

## 2024-10-25 LAB — GLUCOSE, CAPILLARY: Glucose-Capillary: 96 mg/dL (ref 70–99)

## 2024-10-25 MED ORDER — PHENYLEPHRINE HCL-NACL 20-0.9 MG/250ML-% IV SOLN
30.0000 ug/min | INTRAVENOUS | Status: DC
  Filled 2024-10-25: qty 250

## 2024-10-25 MED ORDER — MILRINONE LACTATE IN DEXTROSE 20-5 MG/100ML-% IV SOLN
0.3000 ug/kg/min | INTRAVENOUS | Status: DC
Start: 2024-10-26 — End: 2024-10-27
  Filled 2024-10-25: qty 100

## 2024-10-25 MED ORDER — VANCOMYCIN HCL 1.5 G IV SOLR
1500.0000 mg | INTRAVENOUS | Status: DC
  Filled 2024-10-25: qty 30

## 2024-10-25 MED ORDER — INSULIN REGULAR(HUMAN) IN NACL 100-0.9 UT/100ML-% IV SOLN
INTRAVENOUS | Status: AC
  Administered 2024-10-26: 2 [IU]/h via INTRAVENOUS
  Filled 2024-10-25: qty 100

## 2024-10-25 MED ORDER — TRANEXAMIC ACID (OHS) BOLUS VIA INFUSION
15.0000 mg/kg | INTRAVENOUS | Status: AC
  Administered 2024-10-26: 1293 mg via INTRAVENOUS
  Filled 2024-10-25: qty 1293

## 2024-10-25 MED ORDER — CEFAZOLIN SODIUM-DEXTROSE 2-4 GM/100ML-% IV SOLN
2.0000 g | INTRAVENOUS | Status: AC
  Administered 2024-10-26: 2 g via INTRAVENOUS
  Filled 2024-10-25: qty 100

## 2024-10-25 MED ORDER — TRANEXAMIC ACID (OHS) PUMP PRIME SOLUTION
2.0000 mg/kg | INTRAVENOUS | Status: DC
  Filled 2024-10-25: qty 1.72

## 2024-10-25 MED ORDER — NITROGLYCERIN IN D5W 200-5 MCG/ML-% IV SOLN
2.0000 ug/min | INTRAVENOUS | Status: AC
  Administered 2024-10-26: 10 ug/min via INTRAVENOUS
  Filled 2024-10-25: qty 250

## 2024-10-25 MED ORDER — MAGNESIUM SULFATE 50 % IJ SOLN
40.0000 meq | INTRAMUSCULAR | Status: DC
  Filled 2024-10-25: qty 9.85

## 2024-10-25 MED ORDER — EPINEPHRINE HCL 5 MG/250ML IV SOLN IN NS
0.0000 ug/min | INTRAVENOUS | Status: AC
  Administered 2024-10-26: 2 ug/min via INTRAVENOUS
  Filled 2024-10-25: qty 250

## 2024-10-25 MED ORDER — POTASSIUM CHLORIDE 2 MEQ/ML IV SOLN
80.0000 meq | INTRAVENOUS | Status: DC
  Filled 2024-10-25: qty 40

## 2024-10-25 MED ORDER — HEPARIN 30,000 UNITS/1000 ML (OHS) CELLSAVER SOLUTION
Status: DC
  Filled 2024-10-25: qty 1000

## 2024-10-25 MED ORDER — PLASMA-LYTE A IV SOLN
INTRAVENOUS | Status: DC
  Filled 2024-10-25: qty 2.5

## 2024-10-25 MED ORDER — VANCOMYCIN HCL 1250 MG/250ML IV SOLN
1250.0000 mg | INTRAVENOUS | Status: AC
  Administered 2024-10-26: 1250 mg via INTRAVENOUS
  Filled 2024-10-25 (×2): qty 250

## 2024-10-25 MED ORDER — TRANEXAMIC ACID 1000 MG/10ML IV SOLN
1.5000 mg/kg/h | INTRAVENOUS | Status: AC
  Administered 2024-10-26: 1.5 mg/kg/h via INTRAVENOUS
  Filled 2024-10-25: qty 25

## 2024-10-25 MED ORDER — NOREPINEPHRINE 4 MG/250ML-% IV SOLN
0.0000 ug/min | INTRAVENOUS | Status: AC
  Administered 2024-10-26: 2 ug/min via INTRAVENOUS
  Filled 2024-10-25: qty 250

## 2024-10-25 MED ORDER — DEXMEDETOMIDINE HCL IN NACL 400 MCG/100ML IV SOLN
0.1000 ug/kg/h | INTRAVENOUS | Status: AC
  Administered 2024-10-26: .5 ug/kg/h via INTRAVENOUS
  Filled 2024-10-25: qty 100

## 2024-10-25 NOTE — Progress Notes (Signed)
 Left message for patient regarding scheduled PAT appointment for this am.

## 2024-10-25 NOTE — Anesthesia Preprocedure Evaluation (Signed)
 Anesthesia Evaluation  Patient identified by MRN, date of birth, ID band Patient awake    Reviewed: Allergy & Precautions, NPO status , Patient's Chart, lab work & pertinent test results, reviewed documented beta blocker date and time   History of Anesthesia Complications Negative for: history of anesthetic complications  Airway Mallampati: II  TM Distance: >3 FB Neck ROM: Full    Dental  (+) Dental Advisory Given   Pulmonary former smoker   breath sounds clear to auscultation       Cardiovascular hypertension, Pt. on medications and Pt. on home beta blockers pulmonary hypertension(-) angina + CAD (LAD, D1, RCA, PDA disease) and +CHF (Entresto)   Rhythm:Regular Rate:Normal  08/2024 ECHO: EF 25-30%, severely decreased LV function, Grade 1  Dd, normal RVF, mild MR   Neuro/Psych  Headaches  Anxiety Depression       GI/Hepatic Neg liver ROS,GERD  Medicated and Controlled,,  Endo/Other  diabetes (glu 184), Oral Hypoglycemic Agents  semaglutide   Renal/GU negative Renal ROS     Musculoskeletal   Abdominal   Peds  Hematology Hb 12.9, plt 325k   Anesthesia Other Findings   Reproductive/Obstetrics                              Anesthesia Physical Anesthesia Plan  ASA: 4  Anesthesia Plan: General   Post-op Pain Management:    Induction: Intravenous  PONV Risk Score and Plan: 3 and Treatment may vary due to age or medical condition  Airway Management Planned: Oral ETT  Additional Equipment: Arterial line, PA Cath, Ultrasound Guidance Line Placement and TEE  Intra-op Plan:   Post-operative Plan: Post-operative intubation/ventilation  Informed Consent: I have reviewed the patients History and Physical, chart, labs and discussed the procedure including the risks, benefits and alternatives for the proposed anesthesia with the patient or authorized representative who has indicated his/her  understanding and acceptance.     Dental advisory given  Plan Discussed with: CRNA and Surgeon  Anesthesia Plan Comments:          Anesthesia Quick Evaluation

## 2024-10-25 NOTE — Progress Notes (Signed)
 R. Brooks at Dr. Chrystal office aware of urine sample showing bacteria rare

## 2024-10-25 NOTE — Progress Notes (Signed)
 PCP - [name], Karlynn GAILS, MD  Cardiologist - Floretta Mallard, MD   PPM/ICD - denies Device Orders - n/a Rep Notified - n/a  Chest x-ray - 10-25-24 EKG - 09-27-24 Stress Test -  ECHO - 09-20-24 Cardiac Cath - 10-24-24  Sleep Study - denies CPAP - n/a  Fasting Blood Sugar - Per patient MD monitors A1c. She does not check blood sugar regularly. Blood sugar at PAT appointment 96  Last dose of GLP1 agonist-  OZEMPIC , 1 MG/DOSE, GLP1 instructions: Last dose 10-16-24 GLYXAMBI  Last dose 10-25-24 metFORMIN  (GLUCOPHAGE ) Last dose 10-23-24 repaglinide  (PRANDIN ) Last dose 10-25-24  Blood Thinner Instructions-denies Aspirin  Instructions: Instructed to follow up with surgery  ERAS Protcol -NPO   COVID TEST- n/a   Anesthesia review: yes, Hx of CHF, DM  Patient denies shortness of breath, fever, cough and chest pain at PAT appointment   All instructions explained to the patient, with a verbal understanding of the material. Patient agrees to go over the instructions while at home for a better understanding. Patient also instructed to self quarantine after being tested for COVID-19. The opportunity to ask questions was provided.

## 2024-10-26 ENCOUNTER — Encounter (HOSPITAL_COMMUNITY): Payer: Self-pay | Admitting: Vascular Surgery

## 2024-10-26 ENCOUNTER — Inpatient Hospital Stay (HOSPITAL_COMMUNITY)

## 2024-10-26 ENCOUNTER — Inpatient Hospital Stay: Payer: BC Managed Care – PPO

## 2024-10-26 ENCOUNTER — Ambulatory Visit (HOSPITAL_COMMUNITY): Attending: Thoracic Surgery (Cardiothoracic Vascular Surgery)

## 2024-10-26 ENCOUNTER — Inpatient Hospital Stay (HOSPITAL_COMMUNITY)
Admission: RE | Disposition: A | Discharge status: Home / Self Care | Payer: Self-pay | Source: Home / Self Care | Attending: Thoracic Surgery (Cardiothoracic Vascular Surgery)

## 2024-10-26 ENCOUNTER — Inpatient Hospital Stay (HOSPITAL_COMMUNITY): Payer: Self-pay | Admitting: Anesthesiology

## 2024-10-26 ENCOUNTER — Encounter (HOSPITAL_COMMUNITY): Payer: Self-pay | Admitting: Thoracic Surgery (Cardiothoracic Vascular Surgery)

## 2024-10-26 ENCOUNTER — Inpatient Hospital Stay (HOSPITAL_COMMUNITY)
Admission: RE | Admit: 2024-10-26 | Discharge: 2024-10-30 | DRG: 236 | Disposition: A | Discharge status: Home / Self Care | Attending: Thoracic Surgery (Cardiothoracic Vascular Surgery) | Admitting: Thoracic Surgery (Cardiothoracic Vascular Surgery)

## 2024-10-26 DIAGNOSIS — Z833 Family history of diabetes mellitus: Secondary | ICD-10-CM

## 2024-10-26 DIAGNOSIS — Z822 Family history of deafness and hearing loss: Secondary | ICD-10-CM | POA: Diagnosis not present

## 2024-10-26 DIAGNOSIS — I5043 Acute on chronic combined systolic (congestive) and diastolic (congestive) heart failure: Secondary | ICD-10-CM | POA: Diagnosis not present

## 2024-10-26 DIAGNOSIS — Z7982 Long term (current) use of aspirin: Secondary | ICD-10-CM | POA: Diagnosis not present

## 2024-10-26 DIAGNOSIS — Z87891 Personal history of nicotine dependence: Secondary | ICD-10-CM

## 2024-10-26 DIAGNOSIS — L732 Hidradenitis suppurativa: Secondary | ICD-10-CM | POA: Diagnosis present

## 2024-10-26 DIAGNOSIS — Z7984 Long term (current) use of oral hypoglycemic drugs: Secondary | ICD-10-CM | POA: Diagnosis not present

## 2024-10-26 DIAGNOSIS — I110 Hypertensive heart disease with heart failure: Secondary | ICD-10-CM

## 2024-10-26 DIAGNOSIS — D509 Iron deficiency anemia, unspecified: Secondary | ICD-10-CM | POA: Diagnosis present

## 2024-10-26 DIAGNOSIS — I700 Atherosclerosis of aorta: Secondary | ICD-10-CM | POA: Diagnosis not present

## 2024-10-26 DIAGNOSIS — Z951 Presence of aortocoronary bypass graft: Secondary | ICD-10-CM

## 2024-10-26 DIAGNOSIS — I5022 Chronic systolic (congestive) heart failure: Secondary | ICD-10-CM | POA: Diagnosis present

## 2024-10-26 DIAGNOSIS — Z888 Allergy status to other drugs, medicaments and biological substances status: Secondary | ICD-10-CM | POA: Diagnosis not present

## 2024-10-26 DIAGNOSIS — F32A Depression, unspecified: Secondary | ICD-10-CM | POA: Diagnosis present

## 2024-10-26 DIAGNOSIS — E11649 Type 2 diabetes mellitus with hypoglycemia without coma: Secondary | ICD-10-CM | POA: Diagnosis not present

## 2024-10-26 DIAGNOSIS — K219 Gastro-esophageal reflux disease without esophagitis: Secondary | ICD-10-CM | POA: Diagnosis present

## 2024-10-26 DIAGNOSIS — F419 Anxiety disorder, unspecified: Secondary | ICD-10-CM | POA: Diagnosis present

## 2024-10-26 DIAGNOSIS — I2583 Coronary atherosclerosis due to lipid rich plaque: Secondary | ICD-10-CM | POA: Diagnosis not present

## 2024-10-26 DIAGNOSIS — E785 Hyperlipidemia, unspecified: Secondary | ICD-10-CM | POA: Diagnosis present

## 2024-10-26 DIAGNOSIS — J9811 Atelectasis: Secondary | ICD-10-CM | POA: Diagnosis not present

## 2024-10-26 DIAGNOSIS — I3139 Other pericardial effusion (noninflammatory): Secondary | ICD-10-CM | POA: Diagnosis present

## 2024-10-26 DIAGNOSIS — I2510 Atherosclerotic heart disease of native coronary artery without angina pectoris: Secondary | ICD-10-CM | POA: Diagnosis not present

## 2024-10-26 DIAGNOSIS — Z825 Family history of asthma and other chronic lower respiratory diseases: Secondary | ICD-10-CM | POA: Diagnosis not present

## 2024-10-26 DIAGNOSIS — Z8261 Family history of arthritis: Secondary | ICD-10-CM

## 2024-10-26 DIAGNOSIS — D72829 Elevated white blood cell count, unspecified: Secondary | ICD-10-CM | POA: Diagnosis not present

## 2024-10-26 DIAGNOSIS — Z79899 Other long term (current) drug therapy: Secondary | ICD-10-CM | POA: Diagnosis not present

## 2024-10-26 DIAGNOSIS — I255 Ischemic cardiomyopathy: Secondary | ICD-10-CM | POA: Diagnosis present

## 2024-10-26 DIAGNOSIS — Z8249 Family history of ischemic heart disease and other diseases of the circulatory system: Secondary | ICD-10-CM

## 2024-10-26 HISTORY — PX: CORONARY ARTERY BYPASS GRAFT: SHX141

## 2024-10-26 HISTORY — PX: RADIAL ARTERY HARVEST: SHX5067

## 2024-10-26 HISTORY — PX: INTRAOPERATIVE TRANSESOPHAGEAL ECHOCARDIOGRAM: SHX5062

## 2024-10-26 LAB — POCT I-STAT, CHEM 8
BUN: 13 mg/dL (ref 6–20)
BUN: 12 mg/dL (ref 6–20)
BUN: 12 mg/dL (ref 6–20)
BUN: 12 mg/dL (ref 6–20)
Calcium, Ion: 1.26 mmol/L (ref 1.15–1.40)
Calcium, Ion: 1.23 mmol/L (ref 1.15–1.40)
Calcium, Ion: 1.1 mmol/L — ABNORMAL LOW (ref 1.15–1.40)
Calcium, Ion: 1.12 mmol/L — ABNORMAL LOW (ref 1.15–1.40)
Chloride: 103 mmol/L (ref 98–111)
Chloride: 104 mmol/L (ref 98–111)
Chloride: 101 mmol/L (ref 98–111)
Chloride: 103 mmol/L (ref 98–111)
Creatinine, Ser: 0.6 mg/dL (ref 0.44–1.00)
Creatinine, Ser: 0.5 mg/dL (ref 0.44–1.00)
Creatinine, Ser: 0.5 mg/dL (ref 0.44–1.00)
Creatinine, Ser: 0.5 mg/dL (ref 0.44–1.00)
Glucose, Bld: 110 mg/dL — ABNORMAL HIGH (ref 70–99)
Glucose, Bld: 108 mg/dL — ABNORMAL HIGH (ref 70–99)
Glucose, Bld: 109 mg/dL — ABNORMAL HIGH (ref 70–99)
Glucose, Bld: 144 mg/dL — ABNORMAL HIGH (ref 70–99)
HCT: 34 % — ABNORMAL LOW (ref 36.0–46.0)
HCT: 31 % — ABNORMAL LOW (ref 36.0–46.0)
HCT: 27 % — ABNORMAL LOW (ref 36.0–46.0)
HCT: 30 % — ABNORMAL LOW (ref 36.0–46.0)
Hemoglobin: 11.6 g/dL — ABNORMAL LOW (ref 12.0–15.0)
Hemoglobin: 10.5 g/dL — ABNORMAL LOW (ref 12.0–15.0)
Hemoglobin: 9.2 g/dL — ABNORMAL LOW (ref 12.0–15.0)
Hemoglobin: 10.2 g/dL — ABNORMAL LOW (ref 12.0–15.0)
Potassium: 4 mmol/L (ref 3.5–5.1)
Potassium: 3.7 mmol/L (ref 3.5–5.1)
Potassium: 4.1 mmol/L (ref 3.5–5.1)
Potassium: 4.7 mmol/L (ref 3.5–5.1)
Sodium: 139 mmol/L (ref 135–145)
Sodium: 139 mmol/L (ref 135–145)
Sodium: 138 mmol/L (ref 135–145)
Sodium: 140 mmol/L (ref 135–145)
TCO2: 25 mmol/L (ref 22–32)
TCO2: 25 mmol/L (ref 22–32)
TCO2: 26 mmol/L (ref 22–32)
TCO2: 25 mmol/L (ref 22–32)

## 2024-10-26 LAB — CBC
HCT: 32.3 % — ABNORMAL LOW (ref 36.0–46.0)
HCT: 32.4 % — ABNORMAL LOW (ref 36.0–46.0)
Hemoglobin: 9.7 g/dL — ABNORMAL LOW (ref 12.0–15.0)
Hemoglobin: 9.8 g/dL — ABNORMAL LOW (ref 12.0–15.0)
MCH: 25.6 pg — ABNORMAL LOW (ref 26.0–34.0)
MCH: 26 pg (ref 26.0–34.0)
MCHC: 30 g/dL (ref 30.0–36.0)
MCHC: 30.2 g/dL (ref 30.0–36.0)
MCV: 85.2 fL (ref 80.0–100.0)
MCV: 85.9 fL (ref 80.0–100.0)
Platelets: 256 K/uL (ref 150–400)
Platelets: 233 K/uL (ref 150–400)
RBC: 3.79 MIL/uL — ABNORMAL LOW (ref 3.87–5.11)
RBC: 3.77 MIL/uL — ABNORMAL LOW (ref 3.87–5.11)
RDW: 15.2 % (ref 11.5–15.5)
RDW: 15.2 % (ref 11.5–15.5)
WBC: 21.7 K/uL — ABNORMAL HIGH (ref 4.0–10.5)
WBC: 18.8 K/uL — ABNORMAL HIGH (ref 4.0–10.5)
nRBC: 0 % (ref 0.0–0.2)
nRBC: 0 % (ref 0.0–0.2)

## 2024-10-26 LAB — POCT I-STAT 7, (LYTES, BLD GAS, ICA,H+H)
Acid-Base Excess: 0 mmol/L (ref 0.0–2.0)
Acid-base deficit: 1 mmol/L (ref 0.0–2.0)
Acid-base deficit: 1 mmol/L (ref 0.0–2.0)
Acid-base deficit: 3 mmol/L — ABNORMAL HIGH (ref 0.0–2.0)
Acid-base deficit: 4 mmol/L — ABNORMAL HIGH (ref 0.0–2.0)
Acid-base deficit: 2 mmol/L (ref 0.0–2.0)
Bicarbonate: 24.1 mmol/L (ref 20.0–28.0)
Bicarbonate: 25.1 mmol/L (ref 20.0–28.0)
Bicarbonate: 25 mmol/L (ref 20.0–28.0)
Bicarbonate: 23.1 mmol/L (ref 20.0–28.0)
Bicarbonate: 22.6 mmol/L (ref 20.0–28.0)
Bicarbonate: 24.6 mmol/L (ref 20.0–28.0)
Calcium, Ion: 1.06 mmol/L — ABNORMAL LOW (ref 1.15–1.40)
Calcium, Ion: 1.09 mmol/L — ABNORMAL LOW (ref 1.15–1.40)
Calcium, Ion: 1.12 mmol/L — ABNORMAL LOW (ref 1.15–1.40)
Calcium, Ion: 1.15 mmol/L (ref 1.15–1.40)
Calcium, Ion: 1.18 mmol/L (ref 1.15–1.40)
Calcium, Ion: 1.18 mmol/L (ref 1.15–1.40)
HCT: 26 % — ABNORMAL LOW (ref 36.0–46.0)
HCT: 26 % — ABNORMAL LOW (ref 36.0–46.0)
HCT: 27 % — ABNORMAL LOW (ref 36.0–46.0)
HCT: 33 % — ABNORMAL LOW (ref 36.0–46.0)
HCT: 30 % — ABNORMAL LOW (ref 36.0–46.0)
HCT: 30 % — ABNORMAL LOW (ref 36.0–46.0)
Hemoglobin: 8.8 g/dL — ABNORMAL LOW (ref 12.0–15.0)
Hemoglobin: 8.8 g/dL — ABNORMAL LOW (ref 12.0–15.0)
Hemoglobin: 9.2 g/dL — ABNORMAL LOW (ref 12.0–15.0)
Hemoglobin: 11.2 g/dL — ABNORMAL LOW (ref 12.0–15.0)
Hemoglobin: 10.2 g/dL — ABNORMAL LOW (ref 12.0–15.0)
Hemoglobin: 10.2 g/dL — ABNORMAL LOW (ref 12.0–15.0)
O2 Saturation: 100 %
O2 Saturation: 100 %
O2 Saturation: 99 %
O2 Saturation: 96 %
O2 Saturation: 98 %
O2 Saturation: 98 %
Patient temperature: 36.7
Patient temperature: 37.7
Patient temperature: 37.5
Potassium: 3.4 mmol/L — ABNORMAL LOW (ref 3.5–5.1)
Potassium: 5.3 mmol/L — ABNORMAL HIGH (ref 3.5–5.1)
Potassium: 4.6 mmol/L (ref 3.5–5.1)
Potassium: 4.2 mmol/L (ref 3.5–5.1)
Potassium: 4.2 mmol/L (ref 3.5–5.1)
Potassium: 4.2 mmol/L (ref 3.5–5.1)
Sodium: 138 mmol/L (ref 135–145)
Sodium: 138 mmol/L (ref 135–145)
Sodium: 140 mmol/L (ref 135–145)
Sodium: 139 mmol/L (ref 135–145)
Sodium: 141 mmol/L (ref 135–145)
Sodium: 142 mmol/L (ref 135–145)
TCO2: 25 mmol/L (ref 22–32)
TCO2: 26 mmol/L (ref 22–32)
TCO2: 26 mmol/L (ref 22–32)
TCO2: 24 mmol/L (ref 22–32)
TCO2: 24 mmol/L (ref 22–32)
TCO2: 26 mmol/L (ref 22–32)
pCO2 arterial: 41.5 mmHg (ref 32–48)
pCO2 arterial: 40.4 mmHg (ref 32–48)
pCO2 arterial: 44.7 mmHg (ref 32–48)
pCO2 arterial: 45.4 mmHg (ref 32–48)
pCO2 arterial: 46.2 mmHg (ref 32–48)
pCO2 arterial: 51 mmHg — ABNORMAL HIGH (ref 32–48)
pH, Arterial: 7.373 (ref 7.35–7.45)
pH, Arterial: 7.402 (ref 7.35–7.45)
pH, Arterial: 7.356 (ref 7.35–7.45)
pH, Arterial: 7.312 — ABNORMAL LOW (ref 7.35–7.45)
pH, Arterial: 7.3 — ABNORMAL LOW (ref 7.35–7.45)
pH, Arterial: 7.295 — ABNORMAL LOW (ref 7.35–7.45)
pO2, Arterial: 222 mmHg — ABNORMAL HIGH (ref 83–108)
pO2, Arterial: 210 mmHg — ABNORMAL HIGH (ref 83–108)
pO2, Arterial: 153 mmHg — ABNORMAL HIGH (ref 83–108)
pO2, Arterial: 92 mmHg (ref 83–108)
pO2, Arterial: 125 mmHg — ABNORMAL HIGH (ref 83–108)
pO2, Arterial: 114 mmHg — ABNORMAL HIGH (ref 83–108)

## 2024-10-26 LAB — GLUCOSE, CAPILLARY
Glucose-Capillary: 139 mg/dL — ABNORMAL HIGH (ref 70–99)
Glucose-Capillary: 143 mg/dL — ABNORMAL HIGH (ref 70–99)
Glucose-Capillary: 144 mg/dL — ABNORMAL HIGH (ref 70–99)
Glucose-Capillary: 152 mg/dL — ABNORMAL HIGH (ref 70–99)
Glucose-Capillary: 154 mg/dL — ABNORMAL HIGH (ref 70–99)
Glucose-Capillary: 155 mg/dL — ABNORMAL HIGH (ref 70–99)
Glucose-Capillary: 160 mg/dL — ABNORMAL HIGH (ref 70–99)
Glucose-Capillary: 169 mg/dL — ABNORMAL HIGH (ref 70–99)
Glucose-Capillary: 184 mg/dL — ABNORMAL HIGH (ref 70–99)

## 2024-10-26 LAB — BASIC METABOLIC PANEL WITH GFR
Anion gap: 11 (ref 5–15)
BUN: 12 mg/dL (ref 6–20)
CO2: 22 mmol/L (ref 22–32)
Calcium: 7.6 mg/dL — ABNORMAL LOW (ref 8.9–10.3)
Chloride: 106 mmol/L (ref 98–111)
Creatinine, Ser: 0.47 mg/dL (ref 0.44–1.00)
GFR, Estimated: >60 mL/min (ref >60)
Glucose, Bld: 128 mg/dL — ABNORMAL HIGH (ref 70–99)
Potassium: 4.2 mmol/L (ref 3.5–5.1)
Sodium: 139 mmol/L (ref 135–145)

## 2024-10-26 LAB — POCT I-STAT EG7
Acid-Base Excess: 0 mmol/L (ref 0.0–2.0)
Bicarbonate: 24.9 mmol/L (ref 20.0–28.0)
Calcium, Ion: 1.09 mmol/L — ABNORMAL LOW (ref 1.15–1.40)
HCT: 26 % — ABNORMAL LOW (ref 36.0–46.0)
Hemoglobin: 8.8 g/dL — ABNORMAL LOW (ref 12.0–15.0)
O2 Saturation: 75 %
Potassium: 3.2 mmol/L — ABNORMAL LOW (ref 3.5–5.1)
Sodium: 140 mmol/L (ref 135–145)
TCO2: 26 mmol/L (ref 22–32)
pCO2, Ven: 43.1 mmHg — ABNORMAL LOW (ref 44–60)
pH, Ven: 7.37 (ref 7.25–7.43)
pO2, Ven: 42 mmHg (ref 32–45)

## 2024-10-26 LAB — PROTIME-INR
INR: 1.3 — ABNORMAL HIGH (ref 0.8–1.2)
Prothrombin Time: 17.1 s — ABNORMAL HIGH (ref 11.4–15.2)

## 2024-10-26 LAB — ECHO INTRAOPERATIVE TEE
AV Mean grad: 7 mmHg
Height: 68 in
Weight: 2948.8 [oz_av]

## 2024-10-26 LAB — PLATELET COUNT: Platelets: 228 K/uL (ref 150–400)

## 2024-10-26 LAB — MAGNESIUM: Magnesium: 3.1 mg/dL — ABNORMAL HIGH (ref 1.7–2.4)

## 2024-10-26 LAB — HEMOGLOBIN AND HEMATOCRIT, BLOOD
HCT: 27 % — ABNORMAL LOW (ref 36.0–46.0)
Hemoglobin: 8.3 g/dL — ABNORMAL LOW (ref 12.0–15.0)

## 2024-10-26 LAB — APTT: aPTT: 30 s (ref 24–36)

## 2024-10-26 SURGERY — CORONARY ARTERY BYPASS GRAFTING (CABG)
Anesthesia: General | Site: Chest

## 2024-10-26 MED ORDER — NOREPINEPHRINE 4 MG/250ML-% IV SOLN: 0.0000 ug/min | INTRAVENOUS | Status: DC

## 2024-10-26 MED ORDER — ASPIRIN 325 MG PO TBEC
325.0000 mg | DELAYED_RELEASE_TABLET | Freq: Every day | ORAL | Status: DC
  Administered 2024-10-27 – 2024-10-29 (×3): 325 mg via ORAL
  Filled 2024-10-26 (×3): qty 1

## 2024-10-26 MED ORDER — CHLORHEXIDINE GLUCONATE 0.12 % MT SOLN
15.0000 mL | OROMUCOSAL | Status: AC
  Administered 2024-10-26: 15 mL via OROMUCOSAL
  Filled 2024-10-26: qty 15

## 2024-10-26 MED ORDER — CEFAZOLIN SODIUM-DEXTROSE 2-4 GM/100ML-% IV SOLN
2.0000 g | Freq: Three times a day (TID) | INTRAVENOUS | Status: AC
  Administered 2024-10-26 – 2024-10-28 (×6): 2 g via INTRAVENOUS
  Filled 2024-10-26 (×5): qty 100

## 2024-10-26 MED ORDER — ACETAMINOPHEN 160 MG/5ML PO SOLN: 1000.0000 mg | Freq: Four times a day (QID) | ORAL | Status: DC

## 2024-10-26 MED ORDER — FENTANYL CITRATE (PF) 250 MCG/5ML IJ SOLN
INTRAMUSCULAR | Status: AC
  Filled 2024-10-26: qty 5

## 2024-10-26 MED ORDER — LACTATED RINGERS IV SOLN: INTRAVENOUS | Status: DC | PRN

## 2024-10-26 MED ORDER — METOCLOPRAMIDE HCL 5 MG/ML IJ SOLN
10.0000 mg | Freq: Four times a day (QID) | INTRAMUSCULAR | Status: AC
  Administered 2024-10-26 – 2024-10-27 (×6): 10 mg via INTRAVENOUS
  Filled 2024-10-26 (×5): qty 2

## 2024-10-26 MED ORDER — SODIUM CHLORIDE (PF) 0.9 % IJ SOLN
OROMUCOSAL | Status: DC | PRN
  Administered 2024-10-26 (×3): 4 mL via TOPICAL

## 2024-10-26 MED ORDER — PROTAMINE SULFATE 10 MG/ML IV SOLN
INTRAVENOUS | Status: DC | PRN
  Administered 2024-10-26: 270 mg via INTRAVENOUS

## 2024-10-26 MED ORDER — PROTAMINE SULFATE 10 MG/ML IV SOLN
INTRAVENOUS | Status: AC
  Filled 2024-10-26: qty 5

## 2024-10-26 MED ORDER — CITALOPRAM HYDROBROMIDE 20 MG PO TABS
40.0000 mg | ORAL_TABLET | Freq: Every day | ORAL | Status: DC
  Administered 2024-10-27 – 2024-10-29 (×3): 40 mg via ORAL
  Filled 2024-10-26 (×3): qty 2

## 2024-10-26 MED ORDER — ACETAMINOPHEN 500 MG PO TABS
1000.0000 mg | ORAL_TABLET | Freq: Four times a day (QID) | ORAL | Status: DC
  Administered 2024-10-26 – 2024-10-30 (×13): 1000 mg via ORAL
  Filled 2024-10-26 (×12): qty 2

## 2024-10-26 MED ORDER — SODIUM CHLORIDE 0.9 % IV SOLN: INTRAVENOUS | Status: AC

## 2024-10-26 MED ORDER — SUGAMMADEX SODIUM 200 MG/2ML IV SOLN
INTRAVENOUS | Status: DC | PRN
  Administered 2024-10-26: 200 mg via INTRAVENOUS

## 2024-10-26 MED ORDER — BISACODYL 10 MG RE SUPP
10.0000 mg | Freq: Every day | RECTAL | Status: DC
  Filled 2024-10-26: qty 1

## 2024-10-26 MED ORDER — ACETAMINOPHEN 160 MG/5ML PO SOLN
650.0000 mg | Freq: Once | ORAL | Status: AC
  Administered 2024-10-26: 650 mg
  Filled 2024-10-26: qty 20.3

## 2024-10-26 MED ORDER — SODIUM CHLORIDE 0.9% FLUSH
3.0000 mL | Freq: Two times a day (BID) | INTRAVENOUS | Status: DC
  Administered 2024-10-27 – 2024-10-28 (×3): 3 mL via INTRAVENOUS

## 2024-10-26 MED ORDER — ROCURONIUM BROMIDE 10 MG/ML (PF) SYRINGE
PREFILLED_SYRINGE | INTRAVENOUS | Status: DC | PRN
  Administered 2024-10-26: 50 mg via INTRAVENOUS
  Administered 2024-10-26: 20 mg via INTRAVENOUS
  Administered 2024-10-26: 30 mg via INTRAVENOUS
  Administered 2024-10-26: 80 mg via INTRAVENOUS

## 2024-10-26 MED ORDER — ATORVASTATIN CALCIUM 80 MG PO TABS
80.0000 mg | ORAL_TABLET | Freq: Every day | ORAL | Status: DC
  Administered 2024-10-27 – 2024-10-29 (×3): 80 mg via ORAL
  Filled 2024-10-26 (×3): qty 1

## 2024-10-26 MED ORDER — TRAMADOL HCL 50 MG PO TABS
50.0000 mg | ORAL_TABLET | ORAL | Status: DC | PRN
  Administered 2024-10-27: 50 mg via ORAL
  Administered 2024-10-27 – 2024-10-29 (×3): 100 mg via ORAL
  Filled 2024-10-26 (×2): qty 2
  Filled 2024-10-26: qty 1
  Filled 2024-10-26: qty 2

## 2024-10-26 MED ORDER — MORPHINE SULFATE (PF) 2 MG/ML IV SOLN
1.0000 mg | INTRAVENOUS | Status: DC | PRN
  Administered 2024-10-26 (×2): 2 mg via INTRAVENOUS
  Administered 2024-10-26: 4 mg via INTRAVENOUS
  Administered 2024-10-26: 2 mg via INTRAVENOUS
  Administered 2024-10-27: 4 mg via INTRAVENOUS
  Filled 2024-10-26: qty 2
  Filled 2024-10-26 (×2): qty 1
  Filled 2024-10-26: qty 2
  Filled 2024-10-26: qty 1

## 2024-10-26 MED ORDER — PHENYLEPHRINE 80 MCG/ML (10ML) SYRINGE FOR IV PUSH (FOR BLOOD PRESSURE SUPPORT)
PREFILLED_SYRINGE | INTRAVENOUS | Status: AC
  Filled 2024-10-26: qty 10

## 2024-10-26 MED ORDER — LACTATED RINGERS IV SOLN: INTRAVENOUS | Status: AC

## 2024-10-26 MED ORDER — METOPROLOL TARTRATE 5 MG/5ML IV SOLN: 2.5000 mg | INTRAVENOUS | Status: DC | PRN

## 2024-10-26 MED ORDER — SODIUM CHLORIDE 0.9% FLUSH: 3.0000 mL | INTRAVENOUS | Status: DC | PRN

## 2024-10-26 MED ORDER — ALBUMIN HUMAN 5 % IV SOLN: INTRAVENOUS | Status: DC | PRN

## 2024-10-26 MED ORDER — ASPIRIN 81 MG PO CHEW
324.0000 mg | CHEWABLE_TABLET | Freq: Every day | ORAL | Status: DC
  Filled 2024-10-26: qty 4

## 2024-10-26 MED ORDER — SODIUM CHLORIDE 0.45 % IV SOLN: INTRAVENOUS | Status: AC | PRN

## 2024-10-26 MED ORDER — ASPIRIN 81 MG PO CHEW
324.0000 mg | CHEWABLE_TABLET | Freq: Once | ORAL | Status: AC
  Administered 2024-10-26: 324 mg via ORAL
  Filled 2024-10-26: qty 4

## 2024-10-26 MED ORDER — SODIUM BICARBONATE 8.4 % IV SOLN
50.0000 meq | Freq: Once | INTRAVENOUS | Status: AC
  Administered 2024-10-26: 50 meq via INTRAVENOUS

## 2024-10-26 MED ORDER — SODIUM CHLORIDE (PF) 0.9 % IJ SOLN
INTRAMUSCULAR | Status: DC | PRN
  Administered 2024-10-26: 500 mL via INTRAVASCULAR

## 2024-10-26 MED ORDER — HEPARIN SODIUM (PORCINE) 1000 UNIT/ML IJ SOLN
INTRAMUSCULAR | Status: AC
  Filled 2024-10-26: qty 1

## 2024-10-26 MED ORDER — PROTAMINE SULFATE 10 MG/ML IV SOLN
INTRAVENOUS | Status: AC
  Filled 2024-10-26: qty 25

## 2024-10-26 MED ORDER — BISACODYL 5 MG PO TBEC
10.0000 mg | DELAYED_RELEASE_TABLET | Freq: Every day | ORAL | Status: DC
  Administered 2024-10-27 – 2024-10-28 (×2): 10 mg via ORAL
  Filled 2024-10-26 (×2): qty 2

## 2024-10-26 MED ORDER — POTASSIUM CHLORIDE 10 MEQ/50ML IV SOLN: 10.0000 meq | INTRAVENOUS | Status: AC

## 2024-10-26 MED ORDER — SODIUM CHLORIDE 0.9 % IV SOLN
INTRAVENOUS | Status: DC | PRN
Start: 2024-10-26 — End: 2024-10-26

## 2024-10-26 MED ORDER — CHLORHEXIDINE GLUCONATE 4 % EX SOLN: 30.0000 mL | CUTANEOUS | Status: DC

## 2024-10-26 MED ORDER — MAGNESIUM SULFATE 4 GM/100ML IV SOLN
4.0000 g | Freq: Once | INTRAVENOUS | Status: AC
  Administered 2024-10-26: 4 g via INTRAVENOUS
  Filled 2024-10-26: qty 100

## 2024-10-26 MED ORDER — EPINEPHRINE HCL 5 MG/250ML IV SOLN IN NS: 0.0000 ug/min | INTRAVENOUS | Status: DC

## 2024-10-26 MED ORDER — PANTOPRAZOLE SODIUM 40 MG IV SOLR
40.0000 mg | Freq: Every day | INTRAVENOUS | Status: AC
  Administered 2024-10-26 – 2024-10-27 (×2): 40 mg via INTRAVENOUS
  Filled 2024-10-26 (×2): qty 10

## 2024-10-26 MED ORDER — SODIUM CHLORIDE 0.9 % IV SOLN: 250.0000 mL | INTRAVENOUS | Status: DC

## 2024-10-26 MED ORDER — VASOPRESSIN 20 UNIT/ML IV SOLN
INTRAVENOUS | Status: AC
  Filled 2024-10-26: qty 1

## 2024-10-26 MED ORDER — BUDESONIDE 0.25 MG/2ML IN SUSP: 0.2500 mg | Freq: Two times a day (BID) | RESPIRATORY_TRACT | Status: DC | PRN

## 2024-10-26 MED ORDER — ~~LOC~~ CARDIAC SURGERY, PATIENT & FAMILY EDUCATION
Freq: Once | Status: DC
  Filled 2024-10-26: qty 1

## 2024-10-26 MED ORDER — ALBUTEROL SULFATE (2.5 MG/3ML) 0.083% IN NEBU: 2.5000 mg | INHALATION_SOLUTION | Freq: Four times a day (QID) | RESPIRATORY_TRACT | Status: DC | PRN

## 2024-10-26 MED ORDER — OXYCODONE HCL 5 MG PO TABS
5.0000 mg | ORAL_TABLET | ORAL | Status: DC | PRN
  Administered 2024-10-26 – 2024-10-27 (×3): 10 mg via ORAL
  Administered 2024-10-27 (×2): 5 mg via ORAL
  Administered 2024-10-27 – 2024-10-29 (×8): 10 mg via ORAL
  Filled 2024-10-26 (×5): qty 2
  Filled 2024-10-26: qty 1
  Filled 2024-10-26 (×6): qty 2
  Filled 2024-10-26: qty 1

## 2024-10-26 MED ORDER — METOPROLOL TARTRATE 12.5 MG HALF TABLET
12.5000 mg | ORAL_TABLET | Freq: Once | ORAL | Status: DC
  Filled 2024-10-26: qty 1

## 2024-10-26 MED ORDER — NITROGLYCERIN IN D5W 200-5 MCG/ML-% IV SOLN: 7.0000 ug/min | INTRAVENOUS | Status: AC

## 2024-10-26 MED ORDER — HEPARIN SODIUM (PORCINE) 1000 UNIT/ML IJ SOLN
INTRAMUSCULAR | Status: DC | PRN
Start: 2024-10-26 — End: 2024-10-26
  Administered 2024-10-26: 2000 [IU] via INTRAVENOUS
  Administered 2024-10-26: 25000 [IU] via INTRAVENOUS

## 2024-10-26 MED ORDER — DOXYCYCLINE HYCLATE 100 MG PO TABS
100.0000 mg | ORAL_TABLET | Freq: Two times a day (BID) | ORAL | Status: DC
  Administered 2024-10-26 – 2024-10-29 (×8): 100 mg via ORAL
  Filled 2024-10-26 (×8): qty 1

## 2024-10-26 MED ORDER — FENTANYL CITRATE (PF) 250 MCG/5ML IJ SOLN
INTRAMUSCULAR | Status: DC | PRN
  Administered 2024-10-26: 100 ug via INTRAVENOUS
  Administered 2024-10-26: 50 ug via INTRAVENOUS
  Administered 2024-10-26: 100 ug via INTRAVENOUS
  Administered 2024-10-26: 350 ug via INTRAVENOUS
  Administered 2024-10-26 (×2): 150 ug via INTRAVENOUS
  Administered 2024-10-26: 50 ug via INTRAVENOUS
  Administered 2024-10-26: 100 ug via INTRAVENOUS

## 2024-10-26 MED ORDER — CHLORHEXIDINE GLUCONATE CLOTH 2 % EX PADS
6.0000 | MEDICATED_PAD | Freq: Every day | CUTANEOUS | Status: DC
  Administered 2024-10-27 – 2024-10-28 (×2): 6 via TOPICAL

## 2024-10-26 MED ORDER — ONDANSETRON HCL 4 MG/2ML IJ SOLN: 4.0000 mg | Freq: Four times a day (QID) | INTRAMUSCULAR | Status: DC | PRN

## 2024-10-26 MED ORDER — ALBUTEROL-BUDESONIDE 90-80 MCG/ACT IN AERO: 2.0000 | INHALATION_SPRAY | Freq: Four times a day (QID) | RESPIRATORY_TRACT | Status: DC | PRN

## 2024-10-26 MED ORDER — 0.9 % SODIUM CHLORIDE (POUR BTL) OPTIME
TOPICAL | Status: DC | PRN
  Administered 2024-10-26: 5000 mL

## 2024-10-26 MED ORDER — PROPOFOL 10 MG/ML IV BOLUS
INTRAVENOUS | Status: DC | PRN
  Administered 2024-10-26: 20 mg via INTRAVENOUS

## 2024-10-26 MED ORDER — METOPROLOL TARTRATE 12.5 MG HALF TABLET
12.5000 mg | ORAL_TABLET | Freq: Two times a day (BID) | ORAL | Status: DC
  Administered 2024-10-27 – 2024-10-29 (×6): 12.5 mg via ORAL
  Filled 2024-10-26 (×6): qty 1

## 2024-10-26 MED ORDER — DEXMEDETOMIDINE HCL IN NACL 400 MCG/100ML IV SOLN
0.0000 ug/kg/h | INTRAVENOUS | Status: DC
  Administered 2024-10-26: 0 ug/kg/h via INTRAVENOUS

## 2024-10-26 MED ORDER — MIDAZOLAM HCL (PF) 2 MG/2ML IJ SOLN: 2.0000 mg | INTRAMUSCULAR | Status: DC | PRN

## 2024-10-26 MED ORDER — MIDAZOLAM HCL (PF) 10 MG/2ML IJ SOLN
INTRAMUSCULAR | Status: AC
  Filled 2024-10-26: qty 2

## 2024-10-26 MED ORDER — VANCOMYCIN HCL IN DEXTROSE 1-5 GM/200ML-% IV SOLN
1000.0000 mg | Freq: Once | INTRAVENOUS | Status: AC
  Administered 2024-10-26: 1000 mg via INTRAVENOUS
  Filled 2024-10-26: qty 200

## 2024-10-26 MED ORDER — ROCURONIUM BROMIDE 10 MG/ML (PF) SYRINGE
PREFILLED_SYRINGE | INTRAVENOUS | Status: AC
  Filled 2024-10-26: qty 10

## 2024-10-26 MED ORDER — CHLORHEXIDINE GLUCONATE 0.12 % MT SOLN
15.0000 mL | Freq: Once | OROMUCOSAL | Status: AC
  Administered 2024-10-26: 15 mL via OROMUCOSAL
  Filled 2024-10-26: qty 15

## 2024-10-26 MED ORDER — DEXTROSE 50 % IV SOLN: 0.0000 mL | INTRAVENOUS | Status: DC | PRN

## 2024-10-26 MED ORDER — ALBUMIN HUMAN 5 % IV SOLN
250.0000 mL | INTRAVENOUS | Status: DC | PRN
  Administered 2024-10-26: 12.5 g via INTRAVENOUS

## 2024-10-26 MED ORDER — INSULIN REGULAR(HUMAN) IN NACL 100-0.9 UT/100ML-% IV SOLN: INTRAVENOUS | Status: DC

## 2024-10-26 MED ORDER — ETOMIDATE 2 MG/ML IV SOLN
INTRAVENOUS | Status: AC
  Filled 2024-10-26: qty 10

## 2024-10-26 MED ORDER — INSULIN ASPART 100 UNIT/ML IJ SOLN
0.0000 [IU] | INTRAMUSCULAR | Status: DC | PRN
  Administered 2024-10-26: 4 [IU] via SUBCUTANEOUS
  Filled 2024-10-26: qty 4

## 2024-10-26 MED ORDER — PANTOPRAZOLE SODIUM 40 MG PO TBEC
40.0000 mg | DELAYED_RELEASE_TABLET | Freq: Every day | ORAL | Status: DC
  Administered 2024-10-28 – 2024-10-29 (×2): 40 mg via ORAL
  Filled 2024-10-26 (×2): qty 1

## 2024-10-26 MED ORDER — HEMOSTATIC AGENTS (NO CHARGE) OPTIME
TOPICAL | Status: DC | PRN
  Administered 2024-10-26: 1 via TOPICAL

## 2024-10-26 MED ORDER — PROPOFOL 10 MG/ML IV BOLUS
INTRAVENOUS | Status: AC
Start: 2024-10-26 — End: 2024-10-26
  Filled 2024-10-26: qty 20

## 2024-10-26 MED ORDER — BUPROPION HCL ER (SR) 150 MG PO TB12
150.0000 mg | ORAL_TABLET | Freq: Two times a day (BID) | ORAL | Status: DC
  Administered 2024-10-26 – 2024-10-29 (×7): 150 mg via ORAL
  Filled 2024-10-26 (×9): qty 1

## 2024-10-26 MED ORDER — ALPRAZOLAM 0.5 MG PO TABS
0.5000 mg | ORAL_TABLET | Freq: Two times a day (BID) | ORAL | Status: DC | PRN
Start: 2024-10-26 — End: 2024-10-30
  Administered 2024-10-29: 0.5 mg via ORAL
  Filled 2024-10-26: qty 1

## 2024-10-26 MED ORDER — METOPROLOL TARTRATE 25 MG/10 ML ORAL SUSPENSION
12.5000 mg | Freq: Two times a day (BID) | ORAL | Status: DC
  Filled 2024-10-26: qty 10

## 2024-10-26 MED ORDER — MIDAZOLAM HCL (PF) 5 MG/ML IJ SOLN
INTRAMUSCULAR | Status: DC | PRN
  Administered 2024-10-26 (×2): 2 mg via INTRAVENOUS
  Administered 2024-10-26 (×4): 1 mg via INTRAVENOUS
  Administered 2024-10-26: 2 mg via INTRAVENOUS

## 2024-10-26 MED ORDER — DOCUSATE SODIUM 100 MG PO CAPS
200.0000 mg | ORAL_CAPSULE | Freq: Every day | ORAL | Status: DC
  Administered 2024-10-27 – 2024-10-28 (×2): 200 mg via ORAL
  Filled 2024-10-26 (×3): qty 2

## 2024-10-26 SURGICAL SUPPLY — 86 items
ADAPTER MULTI PERFUSION 15 (ADAPTER) ×3 IMPLANT
BAG DECANTER FOR FLEXI CONT (MISCELLANEOUS) ×3 IMPLANT
BLADE CLIPPER SURG (BLADE) ×3 IMPLANT
BLADE STERNUM SYSTEM 6 (BLADE) ×3 IMPLANT
BLADE SURG 11 STRL SS (BLADE) IMPLANT
BLADE SURG 15 STRL LF DISP TIS (BLADE) ×3 IMPLANT
BNDG ELASTIC 4X5.8 VLCR NS LF (GAUZE/BANDAGES/DRESSINGS) IMPLANT
BNDG ELASTIC 4X5.8 VLCR STR LF (GAUZE/BANDAGES/DRESSINGS) ×3 IMPLANT
BNDG ELASTIC 6INX 5YD STR LF (GAUZE/BANDAGES/DRESSINGS) ×3 IMPLANT
BNDG ELASTIC 6X15 VLCR STRL LF (GAUZE/BANDAGES/DRESSINGS) IMPLANT
BNDG GAUZE DERMACEA FLUFF 4 (GAUZE/BANDAGES/DRESSINGS) ×3 IMPLANT
CANISTER SUCTION 3000ML PPV (SUCTIONS) ×3 IMPLANT
CANNULA AORTIC ROOT 9FR (CANNULA) ×3 IMPLANT
CANNULA EZ GLIDE AORTIC 21FR (CANNULA) IMPLANT
CANNULA MC2 2 STG 36/46 NON-V (CANNULA) IMPLANT
CANNULA VESSEL 3MM BLUNT TIP (CANNULA) ×9 IMPLANT
CATH ROBINSON RED A/P 18FR (CATHETERS) ×3 IMPLANT
CATH THORACIC 36FR (CATHETERS) ×3 IMPLANT
CATH THORACIC 36FR RT ANG (CATHETERS) ×3 IMPLANT
CLIP APPLIE 9.375 SM OPEN (CLIP) IMPLANT
CLIP FOGARTY SPRING 6M (CLIP) IMPLANT
CLIP TI MEDIUM 24 (CLIP) IMPLANT
CLIP TI WIDE RED SMALL 24 (CLIP) IMPLANT
CONTAINER PROTECT SURGISLUSH (MISCELLANEOUS) ×6 IMPLANT
COVER MAYO STAND STRL (DRAPES) ×3 IMPLANT
DERMABOND ADVANCED .7 DNX12 (GAUZE/BANDAGES/DRESSINGS) IMPLANT
DRAPE EXTREMITY T 121X128X90 (DISPOSABLE) ×3 IMPLANT
DRAPE HALF SHEET 40X57 (DRAPES) ×3 IMPLANT
DRAPE SRG 135X102X78XABS (DRAPES) ×3 IMPLANT
DRAPE WARM FLUID 44X44 (DRAPES) ×3 IMPLANT
DRSG COVADERM 4X14 (GAUZE/BANDAGES/DRESSINGS) ×3 IMPLANT
ELECTRODE REM PT RTRN 9FT ADLT (ELECTROSURGICAL) ×6 IMPLANT
FELT TEFLON 1X6 (MISCELLANEOUS) ×3 IMPLANT
GAUZE SPONGE 4X4 12PLY STRL (GAUZE/BANDAGES/DRESSINGS) ×6 IMPLANT
GEL ULTRASOUND 20GR AQUASONIC (MISCELLANEOUS) IMPLANT
GLOVE SS BIOGEL STRL SZ 7.5 (GLOVE) ×3 IMPLANT
GOWN STRL REUS W/ TWL LRG LVL3 (GOWN DISPOSABLE) ×12 IMPLANT
GOWN STRL REUS W/ TWL XL LVL3 (GOWN DISPOSABLE) ×6 IMPLANT
HEMOSTAT POWDER SURGIFOAM 1G (HEMOSTASIS) ×12 IMPLANT
HEMOSTAT SURGICEL 2X14 (HEMOSTASIS) ×3 IMPLANT
KIT BASIN OR (CUSTOM PROCEDURE TRAY) ×3 IMPLANT
KIT SUCTION CATH 14FR (SUCTIONS) ×6 IMPLANT
KIT TURNOVER KIT B (KITS) ×3 IMPLANT
KIT VASOVIEW HEMOPRO 2 VH 4000 (KITS) ×3 IMPLANT
MARKER GRAFT CORONARY BYPASS (MISCELLANEOUS) ×9 IMPLANT
PACK E OPEN HEART (SUTURE) ×3 IMPLANT
PACK OPEN HEART (CUSTOM PROCEDURE TRAY) ×3 IMPLANT
PAD ARMBOARD POSITIONER FOAM (MISCELLANEOUS) ×6 IMPLANT
PAD ELECT DEFIB RADIOL ZOLL (MISCELLANEOUS) ×3 IMPLANT
PENCIL BUTTON HOLSTER BLD 10FT (ELECTRODE) ×3 IMPLANT
POSITIONER HEAD DONUT 9IN (MISCELLANEOUS) ×3 IMPLANT
PUNCH AORTIC ROTATE 4.0MM (MISCELLANEOUS) IMPLANT
PUNCH AORTIC ROTATE 4.5MM 8IN (MISCELLANEOUS) IMPLANT
PUNCH AORTIC ROTATE 5MM 8IN (MISCELLANEOUS) IMPLANT
SET MPS 3-ND DEL (MISCELLANEOUS) IMPLANT
SHEARS HARMONIC 9CM CVD (BLADE) ×3 IMPLANT
SOLN 0.9% NACL POUR BTL 1000ML (IV SOLUTION) ×15 IMPLANT
SOLN STERILE WATER BTL 1000 ML (IV SOLUTION) ×6 IMPLANT
SOLUTION ANTFG W/FOAM PAD STRL (MISCELLANEOUS) IMPLANT
SPONGE T-LAP 18X18 ~~LOC~~+RFID (SPONGE) IMPLANT
SUPPORT HEART JANKE-BARRON (MISCELLANEOUS) ×3 IMPLANT
SUT BONE WAX W31G (SUTURE) ×3 IMPLANT
SUT MNCRL AB 4-0 PS2 18 (SUTURE) IMPLANT
SUT PROLENE 3 0 SH DA (SUTURE) ×3 IMPLANT
SUT PROLENE 4 0 SH DA (SUTURE) IMPLANT
SUT PROLENE 4-0 RB1 .5 CRCL 36 (SUTURE) ×3 IMPLANT
SUT PROLENE 6 0 C 1 30 (SUTURE) ×6 IMPLANT
SUT PROLENE 7 0 BV 1 (SUTURE) IMPLANT
SUT PROLENE 7 0 BV1 MDA (SUTURE) ×3 IMPLANT
SUT PROLENE 8 0 BV175 6 (SUTURE) IMPLANT
SUT STEEL 6MS V (SUTURE) ×3 IMPLANT
SUT STEEL SZ 6 DBL 3X14 BALL (SUTURE) ×3 IMPLANT
SUT VIC AB 1 CTX36XBRD ANBCTR (SUTURE) ×6 IMPLANT
SUT VIC AB 2-0 CT1 TAPERPNT 27 (SUTURE) IMPLANT
SUT VIC AB 2-0 CTX 27 (SUTURE) IMPLANT
SUT VIC AB 3-0 SH 27X BRD (SUTURE) IMPLANT
SUT VIC AB 3-0 X1 27 (SUTURE) IMPLANT
SYSTEM SAHARA CHEST DRAIN ATS (WOUND CARE) ×3 IMPLANT
TAPE CLOTH SURG 4X10 WHT LF (GAUZE/BANDAGES/DRESSINGS) IMPLANT
TOWEL GREEN STERILE (TOWEL DISPOSABLE) ×3 IMPLANT
TOWEL GREEN STERILE FF (TOWEL DISPOSABLE) ×3 IMPLANT
TRAY FOLEY SLVR 16FR TEMP STAT (SET/KITS/TRAYS/PACK) ×3 IMPLANT
TUBE SUCT INTRACARD DLP 20F (MISCELLANEOUS) ×3 IMPLANT
TUBE SUCTION CARDIAC 10FR (CANNULA) ×3 IMPLANT
TUBING LAP HI FLOW INSUFFLATIO (TUBING) ×3 IMPLANT
UNDERPAD 30X36 HEAVY ABSORB (UNDERPADS AND DIAPERS) ×3 IMPLANT

## 2024-10-26 NOTE — Interval H&P Note (Signed)
 History and Physical Interval Note:  10/26/2024 8:36 AM  [name] [name]  has presented today for surgery, with the diagnosis of CAD ISCHEMIC CARDIOMYOPATHY.  The various methods of treatment have been discussed with the patient and family. After consideration of risks, benefits and other options for treatment, the patient has consented to  Procedure(s): CORONARY ARTERY BYPASS GRAFTING (CABG) (N/A) SURGICAL PROCUREMENT, ARTERY, RADIAL (Left) ECHOCARDIOGRAM, TRANSESOPHAGEAL, INTRAOPERATIVE (N/A) as a surgical intervention.  The patient's history has been reviewed, patient examined, no change in status, stable for surgery.  I have reviewed the patient's chart and labs.  Questions were answered to the patient's satisfaction.     [name] [name]

## 2024-10-26 NOTE — Progress Notes (Signed)
   10/26/24 1450  Airway 8 mm  Placement Date/Time: 10/26/24 (c) 0905   Grade View: Grade 1  Airway Device: Endotracheal Tube  Laryngoscope Blade: MAC;3  ETT Types: Oral  Size (mm): 8 mm  Cuffed: Cuffed;Min.occ.pres.  Insertion attempts: 1  Airway Equipment: Stylet  Placement Confi...  Secured at (cm) (S)  21 cm (Per CXR)

## 2024-10-26 NOTE — Anesthesia Procedure Notes (Signed)
 Central Venous Catheter Insertion Performed by: Leonce Athens, MD, anesthesiologist Start/End11/04/2024 7:57 AM, 10/26/2024 8:09 AM Patient location: Pre-op. Preanesthetic checklist: patient identified, IV checked, risks and benefits discussed, surgical consent, monitors and equipment checked, pre-op evaluation, timeout performed and anesthesia consent Lidocaine 1% used for infiltration and patient sedated Hand hygiene performed  and maximum sterile barriers used  Catheter size: 8.5 Fr Sheath introducer Swan type:thermodilution Procedure performed using ultrasound to evaluate access site. Ultrasound Notes:relevant anatomy identified, ultrasound used to visualize needle entry, vessel patent under ultrasound and image(s) printed for medical record. Attempts: 1 Following insertion, line sutured, dressing applied and Biopatch. Post procedure assessment: blood return through all ports, free fluid flow and no air  Patient tolerated the procedure well with no immediate complications. Additional procedure comments: PA catheter:  Routine monitors. Timeout, sterile prep, drape, FBP R neck.  Supine position.  1% Lido local, finder and trocar RIJ 1st pass with US  guidance.  Cordis placed over J wire. PA catheter in easily.  Sterile dressing applied.  Patient tolerated well, VSS.  JAYSON Leonce, MD.

## 2024-10-26 NOTE — Progress Notes (Signed)
 Radiology is called about the reading of chest xray.

## 2024-10-26 NOTE — Anesthesia Procedure Notes (Signed)
 Procedure Name: Intubation Date/Time: 10/26/2024 9:05 AM  Performed by: Claudene Florina Boga, CRNAPre-anesthesia Checklist: Patient identified, Emergency Drugs available, Suction available and Patient being monitored Patient Re-evaluated:Patient Re-evaluated prior to induction Oxygen Delivery Method: Circle System Utilized Preoxygenation: Pre-oxygenation with 100% oxygen Induction Type: IV induction Ventilation: Mask ventilation without difficulty Laryngoscope Size: Mac and 3 Grade View: Grade I Tube type: Oral Tube size: 8.0 mm Number of attempts: 1 Airway Equipment and Method: Stylet Placement Confirmation: ETT inserted through vocal cords under direct vision, positive ETCO2 and breath sounds checked- equal and bilateral Secured at: 23 cm Tube secured with: Tape Dental Injury: Teeth and Oropharynx as per pre-operative assessment

## 2024-10-26 NOTE — Anesthesia Postprocedure Evaluation (Signed)
 Anesthesia Post Note  Patient: [name] [name]  Procedure(s) Performed: CORONARY ARTERY BYPASS GRAFTING TIMES THREE USING LEFT INTERNAL MAMMARY ARTERY, RIGHT ENDOSCOPICALLY GREATER SAPHENOUS VEIN AND RIGHT RADIAL ARTERY HARVEST. (Chest) SURGICAL PROCUREMENT, ARTERY, RADIAL (Left) ECHOCARDIOGRAM, TRANSESOPHAGEAL, INTRAOPERATIVE     Patient location during evaluation: SICU Anesthesia Type: General Level of consciousness: patient remains intubated per anesthesia plan, responds to stimulation and awake and alert Pain management: pain level controlled Vital Signs Assessment: post-procedure vital signs reviewed and stable Respiratory status: patient remains intubated per anesthesia plan and patient on ventilator - see flowsheet for VS (preparing for extubation) Cardiovascular status: stable and blood pressure returned to baseline Postop Assessment: no apparent nausea or vomiting Anesthetic complications: no Comments: Pt doing very well post-op CABG   No notable events documented.  Last Vitals:  Vitals:   10/26/24 1615 10/26/24 1630  BP:    Pulse: 97 99  Resp: 15 14  Temp: 37.6 C 37.7 C  SpO2: 96% 98%    Last Pain:  Vitals:   10/26/24 0704  TempSrc: Oral  PainSc: 0-No pain                 Chares Slaymaker,E. Laural Eiland

## 2024-10-26 NOTE — Progress Notes (Signed)
  Echocardiogram Echocardiogram Transesophageal has been performed.  Koleen KANDICE Popper, RDCS 10/26/2024, 10:41 AM

## 2024-10-26 NOTE — Progress Notes (Signed)
 Per pt she took her potassium chloride  (KLOR-CON ) this am. Last dose of repaglinide  (PRANDIN ) and GLYXAMBI  was yesterday. DR. Leonce is aware.

## 2024-10-26 NOTE — Hospital Course (Addendum)
 HPI:  Ms.  Philbin returns to discuss management of her coronary disease and ischemic cardiomyopathy.   Taija Mathias is a 58 year old woman with a history significant for type 2 diabetes, hyperlipidemia peripartum cardiomyopathy, DVT, SVT status post ablation, reflux, hepatitis, and recently diagnosed two-vessel CAD, ischemic cardiomyopathy, and acute on chronic left ventricular systolic failure.   She choked on some food and required Heimlich maneuver by her husband.  She had a rib fracture.  After that she noted worsening chest pain and shortness of breath.  The pain became pressure that she described as an elephant sitting on her chest.  She developed bilateral peripheral edema.  She had mildly elevated troponins.  She had an echocardiogram which showed her ejection fraction was significantly reduced at 25 to 30% with no significant valvular pathology.  Right ventricular function was normal.  There was a moderate pericardial effusion.  She underwent cardiac catheterization which showed severe two-vessel disease involving the LAD large diagonal branch and the right coronary artery.   I saw her in the hospital.  She was not ready for surgery at that time.  The plan was for her to go home and have medical management and then do an interval surgery once she was in a better shape.   She saw [name] in the heart failure team earlier today.  Overall doing well but still volume overloaded and her diuretics were increased.   Since discharge she has had couple of twinges of chest pain but no severe episodes or anything that caused her concern.  Dr. Kerrin reviewed the patient's diagnostic studies and determined the patient would benefit from surgical intervention. He reviewed the patient's treatment options as well as the risks and benefits of surgery. Ms. Callanan was agreeable to proceed with surgery.  Hospital Course: Ms. Massaro presented to [facility] and was brought to the operating  room on 10/26/24. She underwent CABG x 3 utilizing LIMA to LAD, SVG to PDA, and radial artery to Diagonal as well as endoscopic vein harvest of the right thigh greater saphenous vein and open left radial artery harvest. She tolerated the procedure well and was transferred to the SICU in stable condition on epinephrine and nitroglycerin  infusions.  Vital signs and hemodynamics remained satisfactory.  She was weaned from the ventilator and extubated by 5 PM on the day of surgery.  She had minimal chest tube output overnight.  Chest tubes and monitoring lines were removed on the first postoperative day and she was mobilized.  Inotropic support was slowly weaned off on POD1. Swan ganz catheter and arterial line were removed without complication. She was started on Norvasc for radial artery conduit. She has chronic hidradenitis, 7 day course of doxycycline  was started and general surgery was consulted. They ordered her topical clindamycin  PRN to use outpatient. Epicardial pacing wires and chest tubes were removed without complication. Beta blocker was started and Spironolactone  was restarted. She was routinely diuresed. She was felt stable for transfer to the progressive unit on POD2. She was restarted on low dose Entresto. She was restarted on home diabetes medications at discharge. Her bowels were moving appropriately. She was ambulating well on room air. Her incisions were healing well without sign of infection. She was felt stable for discharge home.

## 2024-10-26 NOTE — Transfer of Care (Signed)
 Immediate Anesthesia Transfer of Care Note  Patient: [name] [name]  Procedure(s) Performed: CORONARY ARTERY BYPASS GRAFTING TIMES THREE USING LEFT INTERNAL MAMMARY ARTERY, RIGHT ENDOSCOPICALLY GREATER SAPHENOUS VEIN AND RIGHT RADIAL ARTERY HARVEST. (Chest) SURGICAL PROCUREMENT, ARTERY, RADIAL (Left) ECHOCARDIOGRAM, TRANSESOPHAGEAL, INTRAOPERATIVE  Patient Location: ICU  Anesthesia Type:General  Level of Consciousness: sedated and Patient remains intubated per anesthesia plan  Airway & Oxygen Therapy: Patient remains intubated per anesthesia plan and Patient placed on Ventilator (see vital sign flow sheet for setting)  Post-op Assessment: Report given to RN and Post -op Vital signs reviewed and stable  Post vital signs: Reviewed and stable  Last Vitals:  Vitals Value Taken Time  BP    Temp 36.6 C 10/26/24 14:08  Pulse 94 10/26/24 14:08  Resp 15 10/26/24 14:08  SpO2 98 % 10/26/24 14:08  Vitals shown include unfiled device data.  Last Pain:  Vitals:   10/26/24 0704  TempSrc: Oral  PainSc: 0-No pain         Complications: No notable events documented.

## 2024-10-26 NOTE — Op Note (Signed)
 NAMETALLI, KIMMER MEDICAL RECORD NO: [id number] ACCOUNT NO: [id number] DATE OF BIRTH: 02-18-1966 FACILITY: [facility] LOCATION: [facility] PHYSICIAN: Elspeth BROCKS. Kerrin, MD  Operative Report   DATE OF PROCEDURE: 10/26/2024  PREOPERATIVE DIAGNOSIS: Severe 2-vessel coronary artery disease with ischemic cardiomyopathy.  POSTOPERATIVE DIAGNOSIS: Severe 2-vessel coronary artery disease with ischemic cardiomyopathy.  PROCEDURE:  Median sternotomy, extracorporeal circulation,  Coronary artery bypass grafting x3  Left internal mammary artery to LAD,  Left radial artery to first diagonal,  Saphenous vein graft to posterior descending Endoscopic saphenous vein harvest from right thigh,  Open left radial artery harvest.  SURGEON: Elspeth BROCKS. Kerrin, MD  ASSISTANT: Con Clunes, MD.   Physicians assistants: Kyla Donald, PA. Con Bend, [place].  Experienced assistance was necessary for this due to surgical complexity. Dr. Con Su assisted with exposure, retraction of delicate tissues, suture management, and suctioning during the anastomosis and decannulation from bypass and wound closure.  Donielle Zimmerman harvested the left radial artery in an open fashion using a harmonic scalpel and closed the left arm incision. Con Bend independently harvested the greater saphenous vein from the right thigh endoscopically and closed the leg incisions.  ANESTHESIA: General.  FINDINGS: Transesophageal echocardiography revealed ejection fraction of 20% to 25% with inferior akinesis, mild mitral regurgitation, unchanged to slightly improved post bypass.  Good quality conduits. Coronary arteries diffusely diseased, but good quality at site of anastomoses.  CLINICAL NOTE: The patient is a 58 year old woman with 2-vessel coronary artery disease and ischemic cardiomyopathy. She was referred for coronary artery bypass grafting. The indications, risks, benefits, and alternatives were discussed in  detail with the patient. She understood and accepted the risks and agreed to proceed. She had a normal Allen test on the left arm, which was confirmed with pulse oximetry in the preoperative holding area.  OPERATIVE NOTE: Ms. Fedder was brought to the preoperative holding area on 10/26/2020. The anesthesia service placed a Swan-Ganz catheter and an arterial blood pressure monitoring line. She was taken to the operating room, anesthetized, and intubated. A Foley catheter was placed. Intravenous antibiotics were administered. Dr. Kelly Mace performed transesophageal echocardiography. Please refer to her separately dictated note for full details of that procedure. Findings were as noted above. The chest, abdomen, and legs were prepped and draped in the usual sterile fashion.  A time-out was performed. The conduits were harvested simultaneously. A median sternotomy was performed and the left internal mammary artery was harvested using standard technique. Simultaneously, an incision was made in the medial aspect of the right leg at the level of the knee. The greater saphenous vein was harvested from the right thigh endoscopically. And also an incision was made over the volar aspect of the left wrist. A short segment of the radial artery was dissected out. There was a good pulse distally with proximal occlusion and there was an intact palmar arch Doppler signal with proximal occlusion. The arm incision then was extended just below the antecubital fossa and the radial artery was harvested using the Harmonic scalpel. 2000 units of heparin  was administered during the vessel harvest. All of the grafts were of good quality. After harvesting the radial artery, the arm incision was closed in standard fashion. Once the arm was closed and wrapped and tucked back to the patient's side, the remainder of the full heparin  dose was given.  A sternal retractor was placed and was gradually opened. The pericardium was opened.  There was a moderate serous pericardial effusion. The ascending aorta was inspected. It was of  normal caliber with no evidence of atherosclerotic disease. It was  relatively short due to the patient's body habitus. After confirming adequate anticoagulation of the ACT measurement, the aorta was cannulated via concentric 2-0 Ethibond pledgeted pursestring sutures. A dual-stage venous cannula was placed via a pursestring suture in the right atrial appendage. Cardiopulmonary bypass was initiated. Flows were maintained per protocol. The coronary arteries were inspected and the anastomotic sites were chosen. The conduits were inspected and cut to length. A foam pad was placed in the pericardium to insulate the heart. A temperature probe was placed in the myocardial septum and the cardioplegia cannula was placed in the ascending aorta. The patient was cooled to 32 degrees Celsius.  The aorta was cross-clamped. The left ventricle was emptied via the aortic root vent. Cardiac arrest then was achieved with a combination of cold antegrade blood cardioplegia and topical ice saline. 1.5 L of cardioplegia was administered. There was a diastolic arrest after 700 mL of cardioplegia. There was septal cooling to 12 degrees Celsius.  The distal end of the left radial artery was beveled. It was then anastomosed end-to-side to the first diagonal. This was a high anterolateral branch, which was superficially intramyocardial. It was a 1.5-mm vessel of good quality. The radial was good quality. The end-to-side anastomosis was performed with a running 8-0 Prolene suture. At the completion of the anastomosis, the graft flushed easily. Cardioplegia was administered down the graft and there was good flow and good hemostasis.  Next, a reversed saphenous vein graft was placed end-to-side to the posterior descending branch of the right coronary. The radial was not long enough to reach the posterior descending without concern for tension.  Therefore, the vein was used for that graft. The vein was of good quality. The posterior descending was good quality at the site of the anastomosis. It did have some plaque just distal to the anastomosis, but a 1.5 mm probe did pass that area without significant resistance. The vein was anastomosed end-to-side with a running 7-0 Prolene suture. At the completion of the anastomosis, cardioplegia was administered. There was good flow and good hemostasis. Additional cardioplegia was also administered via the aortic root.  The left internal mammary artery was brought through a window in the pericardium. The distal end was beveled. It was then anastomosed end-to-side to the distal LAD. The LAD was a 1.5 mm good quality target at the site of the anastomosis. It was rather diffusely diseased, but a probe did pass proximally and distally for a good distance in each direction. The anastomosis was performed with a running 8-0 Prolene suture. At the completion of the anastomosis, the bulldog clamp was removed. Septal rewarming was noted. The bulldog clamp was replaced. The mammary pedicle was tacked to the epicardial surface of the heart with 6-0 Prolene sutures.  Additional cardioplegia was administered. The vein graft and radial artery were cut to length. The proximal anastomoses were performed to 4.0-mm punch aortotomies. A 7-0 Prolene was used for the radial and a 6-0 Prolene used for the vein. At the completion of the second proximal anastomosis, the patient was placed in Trendelenburg position. Lidocaine was administered. The aortic root was de-aired and the aortic cross-clamp was removed. The total cross-clamp time was 58 minutes. The patient initially fibrillated but spontaneously converted to sinus rhythm. While rewarming was completed, all proximal and distal anastomoses were inspected for hemostasis. Epicardial pacing wires were placed on the right ventricle and right atrium. DDD pacing was initiated for  bradycardia.  When the patient had rewarmed to a core temperature of 37 degrees Celsius, she was weaned from cardiopulmonary bypass on the first attempt. She was on low-dose epinephrine and norepinephrine infusions at the time of separation from bypass, but those were later discontinued due to hypertension. The initial cardiac index was greater than 2 liters per minute per meter squared and the patient remained hemodynamically stable throughout the rest of bypass.  A test dose of Protamine was administered and was well tolerated. The atrial and aortic cannulae were removed. The remainder of protamine was administered without incident. The chest was irrigated with warm saline. Hemostasis was achieved. The pericardium was reapproximated with interrupted 3-0 silk sutures. The left pleural and mediastinal chest tubes were placed via separate subcostal incisions. The sternum was closed with a combination of single and double heavy gauge stainless steel wires. There was no hemodynamic change with sternal closure. The pectoralis fascia and subcutaneous tissues and skin were closed in standard fashion. All sponge, needle, and instrument counts were correct at the end of the procedure. The patient was taken from the operating room to the surgical intensive care unit intubated and in fair condition.    SUJ D: 10/26/2024 4:26:19 pm T: 10/26/2024 11:36:00 pm  JOB: [id number]/ [id number]

## 2024-10-26 NOTE — Procedures (Signed)
 Extubation Procedure Note  Patient Details:   Name: [name] [name] DOB: Oct 13, 1966 MRN: [id number]   Airway Documentation:    Vent end date: 10/26/24 Vent end time: 1642   Evaluation  O2 sats: stable throughout Complications: No apparent complications Patient did tolerate procedure well.     Yes  Pt extubated per rapid weaning protocol. NIF -33 VC 1.13L. Positive cuff leak leak, no stridor heard, IS . RT will continue to monitor.    [name] 10/26/2024, 4:44 PM

## 2024-10-26 NOTE — Brief Op Note (Signed)
 10/26/2024  3:23 PM  PATIENT:  [name] [name]  58 y.o. female  PRE-OPERATIVE DIAGNOSIS:  CAD ISCHEMIC CARDIOMYOPATHY  POST-OPERATIVE DIAGNOSIS:  CADISCHEMIC CARDIOMYOPATHY  PROCEDURE:  CORONARY ARTERY BYPASS GRAFTING TIMES THREE USING LEFT INTERNAL MAMMARY ARTERY ENDOSCOPIC HARVEST OF THE RIGHT GREATER SAPHENOUS VEIN OPEN SURGICAL PROCUREMENT OF THE LEFT RADIAL ARTERY ECHOCARDIOGRAM, TRANSESOPHAGEAL, INTRAOPERATIVE Vein harvest time: Vein prep time: Radial artery harvest time: 38 minutes Radial artery prep time: 6 minutes -LIMA to LAD -SVG to PDA -Radial to Diagonal  SURGEON:  Surgeons and Role:    * Kerrin Elspeth BROCKS, MD - Primary    * Su, Con RAMAN, MD - Assisting  PHYSICIAN ASSISTANT: Con Bend PA-C, Kyla Donald PA-C  ASSISTANTS: none   ANESTHESIA:   general  EBL:  626 mL   BLOOD ADMINISTERED:none  DRAINS: Mediastinal drains   LOCAL MEDICATIONS USED:  NONE  SPECIMEN:  No Specimen  DISPOSITION OF SPECIMEN:  N/A  COUNTS:  YES  DICTATION: .Dragon Dictation  PLAN OF CARE: Admit to inpatient   PATIENT DISPOSITION:  ICU - intubated and hemodynamically stable.   Delay start of Pharmacological VTE agent (>24hrs) due to surgical blood loss or risk of bleeding: yes

## 2024-10-26 NOTE — Discharge Instructions (Signed)

## 2024-10-26 NOTE — Anesthesia Procedure Notes (Signed)
 Arterial Line Insertion Start/End11/04/2024 8:00 AM Performed by: Claudene Florina Boga, CRNA, CRNA  Patient location: Pre-op. Preanesthetic checklist: patient identified, IV checked, site marked, risks and benefits discussed, surgical consent, monitors and equipment checked, pre-op evaluation, timeout performed and anesthesia consent Lidocaine 1% used for infiltration Right, radial was placed Catheter size: 20 G Hand hygiene performed  and maximum sterile barriers used   Attempts: 1 Following insertion, dressing applied and Biopatch. Post procedure assessment: normal and unchanged  Patient tolerated the procedure well with no immediate complications.

## 2024-10-26 NOTE — Progress Notes (Signed)
  [facility] Evening Rounds:   Hemodynamically stable on Epi 1. CI = 2.4 Extubated  Urine output good  CT output low  CBC    Component Value Date/Time   WBC 21.7 (H) 10/26/2024 1403   RBC 3.79 (L) 10/26/2024 1403   HGB 9.7 (L) 10/26/2024 1403   HGB 12.3 09/28/2024 1454   HGB 13.6 07/04/2024 1123   HGB 13.7 05/01/2016 0838   HCT 32.3 (L) 10/26/2024 1403   HCT 42.8 07/04/2024 1123   HCT 42.6 05/01/2016 0838   PLT 256 10/26/2024 1403   PLT 293 09/28/2024 1454   PLT 246 07/04/2024 1123   MCV 85.2 10/26/2024 1403   MCV 88 07/04/2024 1123   MCV 87.4 05/01/2016 0838   MCH 25.6 (L) 10/26/2024 1403   MCHC 30.0 10/26/2024 1403   RDW 15.2 10/26/2024 1403   RDW 13.9 07/04/2024 1123   RDW 13.8 05/01/2016 0838   LYMPHSABS 2.8 09/28/2024 1454   LYMPHSABS 1.6 05/01/2016 0838   MONOABS 0.5 09/28/2024 1454   MONOABS 0.4 05/01/2016 0838   EOSABS 0.2 09/28/2024 1454   EOSABS 0.2 05/01/2016 0838   BASOSABS 0.1 09/28/2024 1454   BASOSABS 0.1 05/01/2016 0838     BMET    Component Value Date/Time   NA 139 10/26/2024 1359   NA 138 07/04/2024 1122   NA 135 (L) 12/06/2015 1316   K 4.2 10/26/2024 1359   K 3.7 12/06/2015 1316   CL 103 10/26/2024 1253   CO2 29 10/25/2024 1200   CO2 21 (L) 12/06/2015 1316   GLUCOSE 144 (H) 10/26/2024 1253   GLUCOSE 181 (H) 12/06/2015 1316   GLUCOSE 131 (H) 12/08/2006 1022   BUN 12 10/26/2024 1253   BUN 13 07/04/2024 1122   BUN 7.2 12/06/2015 1316   CREATININE 0.50 10/26/2024 1253   CREATININE 0.67 08/20/2023 1528   CREATININE 0.7 12/06/2015 1316   CALCIUM  9.0 10/25/2024 1200   CALCIUM  9.1 12/06/2015 1316   EGFR 95 07/04/2024 1122   GFRNONAA >60 10/25/2024 1200   GFRNONAA >60 08/20/2023 1528     A/P:  Stable postop course. Continue current plans

## 2024-10-27 ENCOUNTER — Inpatient Hospital Stay (HOSPITAL_COMMUNITY)

## 2024-10-27 ENCOUNTER — Encounter (HOSPITAL_COMMUNITY): Payer: Self-pay | Admitting: Thoracic Surgery (Cardiothoracic Vascular Surgery)

## 2024-10-27 LAB — BASIC METABOLIC PANEL WITH GFR
Anion gap: 10 (ref 5–15)
Anion gap: 11 (ref 5–15)
BUN: 11 mg/dL (ref 6–20)
BUN: 14 mg/dL (ref 6–20)
CO2: 24 mmol/L (ref 22–32)
CO2: 24 mmol/L (ref 22–32)
Calcium: 8.1 mg/dL — ABNORMAL LOW (ref 8.9–10.3)
Calcium: 8.5 mg/dL — ABNORMAL LOW (ref 8.9–10.3)
Chloride: 106 mmol/L (ref 98–111)
Chloride: 102 mmol/L (ref 98–111)
Creatinine, Ser: 0.64 mg/dL (ref 0.44–1.00)
Creatinine, Ser: 0.76 mg/dL (ref 0.44–1.00)
GFR, Estimated: >60 mL/min (ref >60)
GFR, Estimated: >60 mL/min (ref >60)
Glucose, Bld: 147 mg/dL — ABNORMAL HIGH (ref 70–99)
Glucose, Bld: 162 mg/dL — ABNORMAL HIGH (ref 70–99)
Potassium: 4.2 mmol/L (ref 3.5–5.1)
Potassium: 4.3 mmol/L (ref 3.5–5.1)
Sodium: 140 mmol/L (ref 135–145)
Sodium: 137 mmol/L (ref 135–145)

## 2024-10-27 LAB — CBC
HCT: 33 % — ABNORMAL LOW (ref 36.0–46.0)
HCT: 32.6 % — ABNORMAL LOW (ref 36.0–46.0)
Hemoglobin: 9.8 g/dL — ABNORMAL LOW (ref 12.0–15.0)
Hemoglobin: 9.8 g/dL — ABNORMAL LOW (ref 12.0–15.0)
MCH: 25.7 pg — ABNORMAL LOW (ref 26.0–34.0)
MCH: 26.2 pg (ref 26.0–34.0)
MCHC: 29.7 g/dL — ABNORMAL LOW (ref 30.0–36.0)
MCHC: 30.1 g/dL (ref 30.0–36.0)
MCV: 86.6 fL (ref 80.0–100.0)
MCV: 87.2 fL (ref 80.0–100.0)
Platelets: 258 K/uL (ref 150–400)
Platelets: 272 K/uL (ref 150–400)
RBC: 3.81 MIL/uL — ABNORMAL LOW (ref 3.87–5.11)
RBC: 3.74 MIL/uL — ABNORMAL LOW (ref 3.87–5.11)
RDW: 15.2 % (ref 11.5–15.5)
RDW: 15.3 % (ref 11.5–15.5)
WBC: 18.6 K/uL — ABNORMAL HIGH (ref 4.0–10.5)
WBC: 21.7 K/uL — ABNORMAL HIGH (ref 4.0–10.5)
nRBC: 0 % (ref 0.0–0.2)
nRBC: 0 % (ref 0.0–0.2)

## 2024-10-27 LAB — MAGNESIUM
Magnesium: 2.4 mg/dL (ref 1.7–2.4)
Magnesium: 2.3 mg/dL (ref 1.7–2.4)

## 2024-10-27 LAB — GLUCOSE, CAPILLARY
Glucose-Capillary: 115 mg/dL — ABNORMAL HIGH (ref 70–99)
Glucose-Capillary: 123 mg/dL — ABNORMAL HIGH (ref 70–99)
Glucose-Capillary: 136 mg/dL — ABNORMAL HIGH (ref 70–99)
Glucose-Capillary: 147 mg/dL — ABNORMAL HIGH (ref 70–99)
Glucose-Capillary: 155 mg/dL — ABNORMAL HIGH (ref 70–99)
Glucose-Capillary: 158 mg/dL — ABNORMAL HIGH (ref 70–99)
Glucose-Capillary: 175 mg/dL — ABNORMAL HIGH (ref 70–99)
Glucose-Capillary: 190 mg/dL — ABNORMAL HIGH (ref 70–99)

## 2024-10-27 MED ORDER — REPAGLINIDE 1 MG PO TABS
2.0000 mg | ORAL_TABLET | Freq: Three times a day (TID) | ORAL | Status: DC
  Administered 2024-10-27 – 2024-10-29 (×6): 2 mg via ORAL
  Filled 2024-10-27 (×6): qty 2
  Filled 2024-10-27: qty 1
  Filled 2024-10-27 (×5): qty 2

## 2024-10-27 MED ORDER — AMLODIPINE BESYLATE 5 MG PO TABS
5.0000 mg | ORAL_TABLET | Freq: Every day | ORAL | Status: DC
  Administered 2024-10-27 – 2024-10-29 (×3): 5 mg via ORAL
  Filled 2024-10-27 (×3): qty 1

## 2024-10-27 MED ORDER — INSULIN GLARGINE-YFGN 100 UNIT/ML ~~LOC~~ SOLN
25.0000 [IU] | Freq: Two times a day (BID) | SUBCUTANEOUS | Status: DC
  Administered 2024-10-27 (×2): 25 [IU] via SUBCUTANEOUS
  Filled 2024-10-27 (×4): qty 0.25

## 2024-10-27 MED ORDER — FUROSEMIDE 10 MG/ML IJ SOLN
40.0000 mg | Freq: Once | INTRAMUSCULAR | Status: AC
  Administered 2024-10-27: 40 mg via INTRAVENOUS
  Filled 2024-10-27: qty 4

## 2024-10-27 MED ORDER — ENOXAPARIN SODIUM 40 MG/0.4ML IJ SOSY
40.0000 mg | PREFILLED_SYRINGE | Freq: Every day | INTRAMUSCULAR | Status: DC
  Administered 2024-10-27 – 2024-10-29 (×3): 40 mg via SUBCUTANEOUS
  Filled 2024-10-27 (×3): qty 0.4

## 2024-10-27 MED ORDER — INSULIN ASPART 100 UNIT/ML IJ SOLN
4.0000 [IU] | Freq: Three times a day (TID) | INTRAMUSCULAR | Status: DC
  Administered 2024-10-27 – 2024-10-28 (×3): 4 [IU] via SUBCUTANEOUS
  Filled 2024-10-27 (×4): qty 4

## 2024-10-27 MED ORDER — GABAPENTIN 100 MG PO CAPS
100.0000 mg | ORAL_CAPSULE | Freq: Three times a day (TID) | ORAL | Status: DC | PRN
  Administered 2024-10-28: 100 mg via ORAL
  Filled 2024-10-27: qty 1

## 2024-10-27 MED ORDER — FENTANYL CITRATE (PF) 50 MCG/ML IJ SOSY
25.0000 ug | PREFILLED_SYRINGE | Freq: Once | INTRAMUSCULAR | Status: AC
  Administered 2024-10-27: 25 ug via INTRAVENOUS
  Filled 2024-10-27: qty 1

## 2024-10-27 MED ORDER — INSULIN ASPART 100 UNIT/ML IJ SOLN
0.0000 [IU] | INTRAMUSCULAR | Status: DC
  Administered 2024-10-27 (×2): 2 [IU] via SUBCUTANEOUS
  Administered 2024-10-27 (×2): 4 [IU] via SUBCUTANEOUS
  Administered 2024-10-28: 2 [IU] via SUBCUTANEOUS
  Filled 2024-10-27 (×2): qty 2
  Filled 2024-10-27: qty 4

## 2024-10-27 MED ORDER — SODIUM CHLORIDE 0.9 % IV SOLN: INTRAVENOUS | Status: AC | PRN

## 2024-10-27 MED ORDER — CLINDAMYCIN PHOSPHATE 1 % EX GEL: CUTANEOUS | 0 refills | Status: AC

## 2024-10-27 MED ORDER — INSULIN ASPART 100 UNIT/ML IJ SOLN: 0.0000 [IU] | INTRAMUSCULAR | Status: DC

## 2024-10-27 MED FILL — Sodium Bicarbonate IV Soln 8.4%: INTRAVENOUS | Qty: 50 | Status: AC

## 2024-10-27 MED FILL — Heparin Sodium (Porcine) Inj 1000 Unit/ML: INTRAMUSCULAR | Qty: 2500 | Status: AC

## 2024-10-27 MED FILL — Mannitol IV Soln 20%: INTRAVENOUS | Qty: 500 | Status: AC

## 2024-10-27 MED FILL — Heparin Sodium (Porcine) Inj 1000 Unit/ML: INTRAMUSCULAR | Qty: 20 | Status: AC

## 2024-10-27 MED FILL — Lidocaine HCl Local Soln Prefilled Syringe 100 MG/5ML (2%): INTRAMUSCULAR | Qty: 5 | Status: AC

## 2024-10-27 MED FILL — Magnesium Sulfate Inj 50%: INTRAMUSCULAR | Qty: 10 | Status: AC

## 2024-10-27 MED FILL — Sodium Chloride IV Soln 0.9%: INTRAVENOUS | Qty: 1000 | Status: AC

## 2024-10-27 MED FILL — Electrolyte-R (PH 7.4) Solution: INTRAVENOUS | Qty: 3000 | Status: AC

## 2024-10-27 MED FILL — Potassium Chloride Inj 2 mEq/ML: INTRAVENOUS | Qty: 40 | Status: AC

## 2024-10-27 MED FILL — Heparin Sodium (Porcine) Inj 1000 Unit/ML: Qty: 1000 | Status: AC

## 2024-10-27 NOTE — Progress Notes (Signed)
 [facility] PM Rounding Progress Note  Looks great.  Has walked, pain well controlled, renal function stable.  Vitals:   10/27/24 1900 10/27/24 1924  BP: 111/63   Pulse: 85   Resp: 16   Temp:  98 F (36.7 C)  SpO2: 92%     Plan: - Continue current care  Con Clunes, MD Cardiothoracic Surgery Pager: [phone]

## 2024-10-27 NOTE — Discharge Summary (Signed)
 [address] [place] [address]             [phone]        Physician Discharge Summary  Patient ID: [name] [name] MRN: [id number] DOB/AGE: [birth year] 58 y.o.  Admit date: 10/26/2024 Discharge date: 10/30/2024  Admission Diagnoses:  Patient Active Problem List   Diagnosis Date Noted   Hypokalemia 09/22/2024   Coronary artery disease 09/21/2024   Elevated troponin 09/19/2024   Pleuritis 09/19/2024   Chest pain on breathing 09/19/2024   History of migraine 09/19/2024   History of CAD (coronary artery disease) 09/19/2024   Acute on chronic combined systolic and diastolic CHF (congestive heart failure) (HCC) 09/19/2024   CAD (coronary artery disease) 07/04/2024   History of paroxysmal SVT (supraventricular tachycardia) 05/15/2024   Hyperglycemia 05/15/2024   Colon polyps 04/07/2023   Hyperlipidemia 04/07/2023   Type 2 diabetes mellitus with hyperlipidemia (HCC) 01/06/2023   Menorrhagia 01/06/2023   Pruritus of vulva 01/06/2023   Palpitations 01/06/2023   GERD (gastroesophageal reflux disease) 01/20/2022   Grief 08/15/2021   Perioral dermatitis 01/24/2021   Diabetic neuropathy (HCC) 11/14/2020   Dysphagia 08/09/2019   Cervical radiculopathy at C7 10/27/2018   Rotator cuff tear, non-traumatic, left 12/28/2017   RTI (respiratory tract infection) 11/26/2017   Shoulder pain, left 10/21/2017   Obesity (BMI 35.0-39.9 without comorbidity) 07/07/2017   Fatigue 07/07/2017   Hydradenitis 03/20/2016   Eczema 03/20/2016   Anemia 05/30/2015   Family history of colon cancer 02/28/2014   Reactive airway disease 11/09/2013   Well adult exam 05/26/2012   Iron  deficiency anemia 05/26/2012   Migraine 09/23/2011   Tachycardia 01/21/2011   DOE (dyspnea on exertion) 01/12/2009   TOBACCO USE DISORDER/SMOKER-SMOKING CESSATION DISCUSSED 04/13/2008   Diabetes mellitus type 2 in obese 01/11/2008   Generalized anxiety disorder 01/11/2008   Adjustment  disorder with mixed anxiety and depressed mood 01/11/2008   POLYCYSTIC OVARIAN DISEASE 09/24/2007   Acute on chronic systolic CHF (congestive heart failure) (HCC) 09/24/2007   LIVER FUNCTION TESTS, ABNORMAL 09/24/2007     Discharge Diagnoses:  Patient Active Problem List   Diagnosis Date Noted   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   Hypokalemia 09/22/2024   Coronary artery disease 09/21/2024   Elevated troponin 09/19/2024   Pleuritis 09/19/2024   Chest pain on breathing 09/19/2024   History of migraine 09/19/2024   History of CAD (coronary artery disease) 09/19/2024   Acute on chronic combined systolic and diastolic CHF (congestive heart failure) (HCC) 09/19/2024   CAD (coronary artery disease) 07/04/2024   History of paroxysmal SVT (supraventricular tachycardia) 05/15/2024   Hyperglycemia 05/15/2024   Colon polyps 04/07/2023   Hyperlipidemia 04/07/2023   Type 2 diabetes mellitus with hyperlipidemia (HCC) 01/06/2023   Menorrhagia 01/06/2023   Pruritus of vulva 01/06/2023   Palpitations 01/06/2023   GERD (gastroesophageal reflux disease) 01/20/2022   Grief 08/15/2021   Perioral dermatitis 01/24/2021   Diabetic neuropathy (HCC) 11/14/2020   Dysphagia 08/09/2019   Cervical radiculopathy at C7 10/27/2018   Rotator cuff tear, non-traumatic, left 12/28/2017   RTI (respiratory tract infection) 11/26/2017   Shoulder pain, left 10/21/2017   Obesity (BMI 35.0-39.9 without comorbidity) 07/07/2017   Fatigue 07/07/2017   Hydradenitis 03/20/2016   Eczema 03/20/2016   Anemia 05/30/2015   Family history of colon cancer 02/28/2014   Reactive airway disease 11/09/2013   Well adult exam 05/26/2012   Iron  deficiency anemia 05/26/2012   Migraine  09/23/2011   Tachycardia 01/21/2011   DOE (dyspnea on exertion) 01/12/2009   TOBACCO USE DISORDER/SMOKER-SMOKING CESSATION DISCUSSED 04/13/2008   Diabetes mellitus type 2 in obese 01/11/2008   Generalized anxiety disorder 01/11/2008   Adjustment disorder  with mixed anxiety and depressed mood 01/11/2008   POLYCYSTIC OVARIAN DISEASE 09/24/2007   Acute on chronic systolic CHF (congestive heart failure) (HCC) 09/24/2007   LIVER FUNCTION TESTS, ABNORMAL 09/24/2007     Discharged Condition: stable  HPI:  [name] returns to discuss management of her coronary disease and ischemic cardiomyopathy.   [name] [name] is a 58 year old woman with a history significant for type 2 diabetes, hyperlipidemia peripartum cardiomyopathy, DVT, SVT status post ablation, reflux, hepatitis, and recently diagnosed two-vessel CAD, ischemic cardiomyopathy, and acute on chronic left ventricular systolic failure.   She choked on some food and required Heimlich maneuver by her husband.  She had a rib fracture.  After that she noted worsening chest pain and shortness of breath.  The pain became pressure that she described as an elephant sitting on her chest.  She developed bilateral peripheral edema.  She had mildly elevated troponins.  She had an echocardiogram which showed her ejection fraction was significantly reduced at 25 to 30% with no significant valvular pathology.  Right ventricular function was normal.  There was a moderate pericardial effusion.  She underwent cardiac catheterization which showed severe two-vessel disease involving the LAD large diagonal branch and the right coronary artery.   I saw her in the hospital.  She was not ready for surgery at that time.  The plan was for her to go home and have medical management and then do an interval surgery once she was in a better shape.   She saw [name] in the heart failure team earlier today.  Overall doing well but still volume overloaded and her diuretics were increased.   Since discharge she has had couple of twinges of chest pain but no severe episodes or anything that caused her concern.  Dr. Kerrin reviewed the patient's diagnostic studies and determined the patient would benefit from surgical  intervention. He reviewed the patient's treatment options as well as the risks and benefits of surgery. [name] [name] was agreeable to proceed with surgery.  Hospital Course: [name] [name] presented to [facility] and was brought to the operating room on 10/26/24. She underwent CABG x 3 utilizing LIMA to LAD, SVG to PDA, and radial artery to Diagonal as well as endoscopic vein harvest of the right thigh greater saphenous vein and open left radial artery harvest. She tolerated the procedure well and was transferred to the SICU in stable condition on epinephrine and nitroglycerin  infusions.  Vital signs and hemodynamics remained satisfactory.  She was weaned from the ventilator and extubated by 5 PM on the day of surgery.  She had minimal chest tube output overnight.  Chest tubes and monitoring lines were removed on the first postoperative day and she was mobilized.  Inotropic support was slowly weaned off on POD1. Swan ganz catheter and arterial line were removed without complication. She was started on Norvasc for radial artery conduit. She has chronic hidradenitis, 7 day course of doxycycline  was started and general surgery was consulted. They ordered her topical clindamycin  PRN to use outpatient. Epicardial pacing wires and chest tubes were removed without complication. Beta blocker was started and Spironolactone  was restarted. She was routinely diuresed. She was felt stable for transfer to the progressive unit on POD2. She was restarted on  low dose Entresto. She was restarted on home diabetes medications at discharge. Her bowels were moving appropriately. She was ambulating well on room air. Her incisions were healing well without sign of infection. She was felt stable for discharge home.     Consults: None  Significant Diagnostic Studies: Cardiac Catheterization 10/08/24: Hemodynamic data: Moderate pulmonary hypertension with markedly elevated LVEDP consistent with WHO group 2 PAH.  RV function is  preserved with PAPi 2.3. COP 6.12 andn CI 3.02 by Fick LVEDP 33 mmHg.  There is no pressure gradient across the aortic valve.   Angiographic data: LM: Calcified but widely patent. LAD: Calcified in the proximal segment.  There is moderate diffuse disease in the proximal segment.  There is a proximal 60% stenosis followed by 80% stenosis distal to the origin of D1 with FFR 0.77, indicating hemodynamically significant stenosis.  Large D1 with proximal 80% stenosis with FFR 0.67, hemodynamically significant.  Distal to apical LAD has 90% focal stenosis. LCx: Gives origin to large OM 2 and OM 3, mild diffuse disease. RCA: Large-caliber vessel, gives origin to large PDA and large PL branch.  Proximal segment has a 99% calcific stenosis.      ECHOCARDIOGRAM REPORT       Patient Name:   [name] [name] Date of Exam: 09/20/2024  Medical Rec #:  [id number]       Height:       68.0 in  Accession #:    [id number]      Weight:       202.6 lb  Date of Birth:  [birth year]      BSA:          2.055 m  Patient Age:    57 years        BP:           115/61 mmHg  Patient Gender: F               HR:           89 bpm.  Exam Location:  Inpatient   Procedure: 2D Echo and Intracardiac Opacification Agent (Both Spectral and  Color            Flow Doppler were utilized during procedure).   Indications:    CHF    History:        Patient has prior history of Echocardiogram examinations.  CHF                 and Cardiomyopathy.    Sonographer:    Norleen Amour  Referring Phys: [id number] SUBRINA SUNDIL   IMPRESSIONS     1. Left ventricular ejection fraction, by estimation, is 25 to 30%. The  left ventricle has severely decreased function. The left ventricle  demonstrates regional wall motion abnormalities (see scoring  diagram/findings for description). The left  ventricular internal cavity size was mildly to moderately dilated. Left  ventricular diastolic parameters are consistent with Grade I diastolic   dysfunction (impaired relaxation).   2. Right ventricular systolic function is normal. The right ventricular  size is normal. Tricuspid regurgitation signal is inadequate for assessing  PA pressure.   3. Left atrial size was mildly dilated.   4. Right atrial size was mildly dilated.   5. Moderate pericardial effusion. The pericardial effusion is  circumferential. There is no evidence of cardiac tamponade.   6. The mitral valve is abnormal. Mild mitral valve regurgitation.   7. The aortic valve is abnormal. Aortic valve regurgitation is  not  visualized. Aortic valve sclerosis/calcification is present, without any  evidence of aortic stenosis.   8. The inferior vena cava is normal in size with greater than 50%  respiratory variability, suggesting right atrial pressure of 3 mmHg.   Comparison(s): A prior study was performed on 05/15/2024. Prior images  reviewed side by side. Left ventricular ejection fraction was 40-45% with  akinesis of the distal anteroseptal wall and apex which appear more  pronounced on today's study. There was no   pericardial effusion or mitral regurgitation noted at that time.   Conclusion(s)/Recommendation(s): Critical findings reported to Georganna Archer.   FINDINGS   Left Ventricle: Akinesis of the apex and periapical segments with severe  hypokinesis of remaining segments. No thrombus noted. Left ventricular  ejection fraction, by estimation, is 25 to 30%. The left ventricle has  severely decreased function. The left  ventricle demonstrates regional wall motion abnormalities. Definity   contrast agent was given IV to delineate the left ventricular endocardial  borders. The left ventricular internal cavity size was mildly to  moderately dilated. There is no left  ventricular hypertrophy. Left ventricular diastolic parameters are  consistent with Grade I diastolic dysfunction (impaired relaxation).   Right Ventricle: The right ventricular size is normal.  Right vetricular  wall thickness was not assessed. Right ventricular systolic function is  normal. Tricuspid regurgitation signal is inadequate for assessing PA  pressure.   Left Atrium: Left atrial size was mildly dilated.   Right Atrium: Right atrial size was mildly dilated.   Pericardium: A moderately sized pericardial effusion is present. The  pericardial effusion is circumferential. The pericardial effusion appears  to contain coagulum posteriorly. There is no evidence of cardiac  tamponade.   Mitral Valve: The mitral valve is abnormal. There is moderate thickening  of the mitral valve leaflet(s). Mild mitral valve regurgitation.   Tricuspid Valve: The tricuspid valve is normal in structure. Tricuspid  valve regurgitation is mild.   Aortic Valve: The aortic valve is abnormal. Aortic valve regurgitation is  not visualized. Aortic valve sclerosis/calcification is present, without  any evidence of aortic stenosis. Aortic valve mean gradient measures 7.8  mmHg. Aortic valve peak gradient  measures 15.4 mmHg. Aortic valve area, by VTI measures 1.27 cm.   Pulmonic Valve: The pulmonic valve was normal in structure. Pulmonic valve  regurgitation is trivial.   Aorta: The aortic root and ascending aorta are structurally normal, with  no evidence of dilitation.   Venous: The inferior vena cava is normal in size with greater than 50%  respiratory variability, suggesting right atrial pressure of 3 mmHg.   IAS/Shunts: The interatrial septum was not well visualized.   Additional Comments: There is a small pleural effusion.     LEFT VENTRICLE  PLAX 2D  LVIDd:         6.50 cm      Diastology  LVIDs:         4.40 cm      LV e' medial:    7.62 cm/s  LV PW:         0.90 cm      LV E/e' medial:  17.1  LV IVS:        0.90 cm      LV e' lateral:   8.27 cm/s  LVOT diam:     2.00 cm      LV E/e' lateral: 15.7  LV SV:         46  LV SV Index:  22  LVOT Area:     3.14 cm    LV Volumes  (MOD)  LV vol d, MOD A2C: 210.0 ml  LV vol d, MOD A4C: 186.0 ml  LV vol s, MOD A2C: 148.0 ml  LV vol s, MOD A4C: 137.0 ml  LV SV MOD A2C:     62.0 ml  LV SV MOD A4C:     186.0 ml  LV SV MOD BP:      55.6 ml   RIGHT VENTRICLE             IVC  RV Basal diam:  3.00 cm     IVC diam: 1.80 cm  RV S prime:     12.80 cm/s  TAPSE (M-mode): 2.0 cm   LEFT ATRIUM             Index        RIGHT ATRIUM           Index  LA diam:        3.90 cm 1.90 cm/m   RA Area:     16.30 cm  LA Vol (A2C):   57.6 ml 28.03 ml/m  RA Volume:   45.20 ml  22.00 ml/m  LA Vol (A4C):   53.4 ml 25.99 ml/m  LA Biplane Vol: 55.8 ml 27.15 ml/m   AORTIC VALVE                     PULMONIC VALVE  AV Area (Vmax):    1.39 cm      PV Vmax:       0.91 m/s  AV Area (Vmean):   1.33 cm      PV Peak grad:  3.3 mmHg  AV Area (VTI):     1.27 cm  AV Vmax:           196.25 cm/s  AV Vmean:          126.250 cm/s  AV VTI:            0.363 m  AV Peak Grad:      15.4 mmHg  AV Mean Grad:      7.8 mmHg  LVOT Vmax:         87.00 cm/s  LVOT Vmean:        53.500 cm/s  LVOT VTI:          0.147 m  LVOT/AV VTI ratio: 0.40    AORTA  Ao Root diam: 2.40 cm  Ao Asc diam:  3.10 cm   MITRAL VALVE  MV Area (PHT): 4.57 cm     SHUNTS  MV Decel Time: 166 msec     Systemic VTI:  0.15 m  MV E velocity: 130.00 cm/s  Systemic Diam: 2.00 cm  MV A velocity: 105.00 cm/s  MV E/A ratio:  1.24   Emeline Calender  Electronically signed by Emeline Calender  Signature Date/Time: 09/20/2024/2:01:37 PM        Final     Treatments: surgery:  DATE OF PROCEDURE: 10/26/2024   PREOPERATIVE DIAGNOSIS: Severe 2-vessel coronary artery disease with ischemic cardiomyopathy.   POSTOPERATIVE DIAGNOSIS: Severe 2-vessel coronary artery disease with ischemic cardiomyopathy.   PROCEDURE: Median sternotomy, extracorporeal circulation, coronary artery bypass grafting x3 (left internal mammary artery to LAD, left radial artery to first diagonal, saphenous vein graft to  posterior descending), endoscopic saphenous vein harvest from  right thigh, open left radial artery harvest.   Discharge Exam: Blood pressure (!) 143/67, pulse  94, temperature 98.4 F (36.9 C), temperature source Oral, resp. rate 18, height 5' 8 (1.727 m), weight 86.8 kg, last menstrual period 03/02/2017, SpO2 90%. General appearance: alert, cooperative, and no distress Neurologic: intact Heart: regular rate and rhythm, S1, S2 normal, no murmur, click, rub or gallop Lungs: slightly diminished bibasilar breath sounds Abdomen: soft, non-tender; bowel sounds normal; no masses,  no organomegaly Extremities: edema trace BLE Wound: Clean and dry without sign of infection   Discharge Medications:  The patient has been discharged on:   1.Beta Blocker:  Yes [  X ]                              No   [   ]                              If No, reason:  2.Ace Inhibitor/ARB: Yes [  X ]                                     No  [    ]                                     If No, reason:  3.Statin:   Yes [  X ]                  No  [   ]                  If No, reason:  4.Ecasa:  Yes  [  X ]                  No   [   ]                  If No, reason:  Patient had ACS upon admission: No  Plavix/P2Y12 inhibitor: Yes [   ]                                      No  [  X ]     Discharge Instructions     Amb Referral to Cardiac Rehabilitation   Complete by: As directed    Diagnosis: CABG   CABG X ___: 3   After initial evaluation and assessments completed: Virtual Based Care may be provided alone or in conjunction with Phase 2 Cardiac Rehab based on patient barriers.: Yes   Intensive Cardiac Rehabilitation (ICR) [facility] location only OR Traditional Cardiac Rehabilitation (TCR) *If criteria for ICR are not met will enroll in TCR ([facility] only): Yes      Allergies as of 10/30/2024       Reactions   Adenosine  Shortness Of Breath   SOB and pressure for 20 seconds        Medication List      STOP taking these medications    aspirin  81 MG chewable tablet Replaced by: aspirin  EC 325 MG tablet   metoprolol  succinate 50 MG 24 hr tablet Commonly known as: TOPROL -XL   nitroGLYCERIN  0.4 MG SL tablet Commonly known as: NITROSTAT   TAKE these medications    acetaminophen  325 MG tablet Commonly known as: Tylenol  Take 2 tablets (650 mg total) by mouth every 6 (six) hours as needed for mild pain (pain score 1-3).   Airsupra  90-80 MCG/ACT Aero Generic drug: Albuterol -Budesonide Inhale 2 Inhalations into the lungs 4 (four) times daily as needed.   ALPRAZolam  0.5 MG tablet Commonly known as: XANAX  TAKE 1 TABLET(0.5 MG) BY MOUTH TWICE DAILY What changed:  how much to take how to take this when to take this reasons to take this additional instructions   amLODipine 5 MG tablet Commonly known as: NORVASC Take 1 tablet (5 mg total) by mouth daily.   aspirin  EC 325 MG tablet Take 1 tablet (325 mg total) by mouth daily. Replaces: aspirin  81 MG chewable tablet   atorvastatin  80 MG tablet Commonly known as: LIPITOR Take 1 tablet (80 mg total) by mouth daily.   buPROPion  150 MG 12 hr tablet Commonly known as: WELLBUTRIN  SR TAKE 1 TABLET BY MOUTH TWICE DAILY   Cholecalciferol  25 MCG (1000 UT) tablet Commonly known as: EQL Vitamin D3 Take 1 tablet (1,000 Units total) by mouth daily.   citalopram  40 MG tablet Commonly known as: CELEXA  Take 1 tablet (40 mg total) by mouth daily.   clindamycin  1 % gel Commonly known as: CLINDAGEL Apply to affected area 2 times daily   doxycycline  100 MG tablet Commonly known as: VIBRA -TABS Take 1 tablet (100 mg total) by mouth every 12 (twelve) hours.   furosemide 40 MG tablet Commonly known as: LASIX Take 1.5 tablets (60 mg total) by mouth daily.   gabapentin  100 MG capsule Commonly known as: NEURONTIN  TAKE 1 CAPSULE(100 MG) BY MOUTH THREE TIMES DAILY What changed: See the new instructions.   Glyxambi  25-5 MG  Tabs Generic drug: Empagliflozin -linaGLIPtin  TAKE 1 TABLET BY MOUTH DAILY   metFORMIN  500 MG tablet Commonly known as: GLUCOPHAGE  TAKE 2 TABLETS TWICE DAILY WITH FOOD   metoprolol  tartrate 25 MG tablet Commonly known as: LOPRESSOR  Take 0.5 tablets (12.5 mg total) by mouth 2 (two) times daily.   oxyCODONE  5 MG immediate release tablet Commonly known as: Oxy IR/ROXICODONE  Take 1 tablet (5 mg total) by mouth every 6 (six) hours as needed for severe pain (pain score 7-10).   pantoprazole  40 MG tablet Commonly known as: PROTONIX  TAKE 1 TABLET(40 MG) BY MOUTH TWICE DAILY   potassium chloride  8 MEQ tablet Commonly known as: KLOR-CON  Take 1 tablet (8 mEq total) by mouth 4 (four) times daily.   repaglinide  2 MG tablet Commonly known as: PRANDIN  TAKE 1 TABLET BY MOUTH THREE TIMES DAILY BEFORE MEALS   sacubitril-valsartan 24-26 MG Commonly known as: ENTRESTO Take 1 tablet by mouth 2 (two) times daily.   Semaglutide  (2 MG/DOSE) 8 MG/3ML Sopn 2 mg sq weekly What changed: Another medication with the same name was removed. Continue taking this medication, and follow the directions you see here.   spironolactone  25 MG tablet Commonly known as: ALDACTONE  Take 1 tablet (25 mg total) by mouth daily.        Follow-up Information     Rutha Manuelita HERO, PA-C Follow up.   Specialties: Physician Assistant, Thoracic Surgery Why: Follow up appointment time will get changed in your Mychart, date will likely be 11/17. Our office will call you. Please get a chest xray 1 hour prior to your appointment on the 2nd floor of our building. Contact information: [address] [place] [address] [phone]  Rolan Ezra RAMAN, MD Follow up on 11/16/2024.   Specialty: Cardiology Why: Cardiology appointment is at 1:40PM Contact information: [address] [place] [address] [phone]                 Signed:  Con RAMAN [place],  PA-C  10/30/2024, 12:15 PM

## 2024-10-27 NOTE — Plan of Care (Signed)
  Problem: Clinical Measurements: Goal: Respiratory complications will improve Outcome: Progressing   Problem: Clinical Measurements: Goal: Cardiovascular complication will be avoided Outcome: Progressing   Problem: Clinical Measurements: Goal: Diagnostic test results will improve Outcome: Progressing   Problem: Elimination: Goal: Will not experience complications related to bowel motility Outcome: Progressing

## 2024-10-27 NOTE — Progress Notes (Addendum)
 [facility] DAILY ICU PROGRESS NOTE                   [address]            [place] [address]          [phone]   1 Day Post-Op Procedure(s) (LRB): CORONARY ARTERY BYPASS GRAFTING TIMES THREE USING LEFT INTERNAL MAMMARY ARTERY, RIGHT ENDOSCOPICALLY GREATER SAPHENOUS VEIN AND RIGHT RADIAL ARTERY HARVEST. (N/A) SURGICAL PROCUREMENT, ARTERY, RADIAL (Left) ECHOCARDIOGRAM, TRANSESOPHAGEAL, INTRAOPERATIVE (N/A)  Total Length of Stay:  LOS: 1 day   Subjective: Extubated by 5 PM yesterday.  Awake and alert.  Having expected sternal soreness.  No nausea or shortness of breath.  Epinephrine at 3 mics per minute Nitroglycerin  5 mics per minute Cardiac index 2.5-3  Objective: Vital signs in last 24 hours: Temp:  [97.9 F (36.6 C)-99.9 F (37.7 C)] 99 F (37.2 C) (11/06 0700) Pulse Rate:  [86-101] 89 (11/06 0700) Cardiac Rhythm: Normal sinus rhythm (11/06 0006) Resp:  [11-19] 11 (11/06 0700) BP: (85-118)/(57-73) 110/63 (11/06 0500) SpO2:  [94 %-100 %] 94 % (11/06 0700) Arterial Line BP: (95-140)/(46-68) 132/54 (11/06 0700) FiO2 (%):  [40 %-50 %] 40 % (11/05 1608) Weight:  [87.2 kg] 87.2 kg (11/06 0600)  Filed Weights   10/26/24 0704 10/27/24 0600  Weight: 83.6 kg 87.2 kg    Weight change:    Hemodynamic parameters for last 24 hours: PAP: (25-274)/(3-251) 36/20 CVP:  [3 mmHg-20 mmHg] 13 mmHg CO:  [4.7 L/min-6.6 L/min] 5.8 L/min CI:  [2.39 L/min/m2-3.35 L/min/m2] 2.95 L/min/m2  Intake/Output from previous day: 11/05 0701 - 11/06 0700 In: 3529.6 [P.O.:120; I.V.:2033.9; Blood:355; IV Piggyback:1020.8] Out: 2802 [Urine:2012; Blood:626; Chest Tube:164]  Intake/Output this shift: No intake/output data recorded.  Current Meds: Scheduled Meds:  acetaminophen   1,000 mg Oral Q6H   Or   acetaminophen  (TYLENOL ) oral liquid 160 mg/5 mL  1,000 mg Per Tube Q6H   aspirin  EC  325 mg Oral Daily   Or   aspirin   324 mg Per Tube Daily   atorvastatin   80 mg Oral Daily    bisacodyl  10 mg Oral Daily   Or   bisacodyl  10 mg Rectal Daily   buPROPion   150 mg Oral BID   Chlorhexidine Gluconate Cloth  6 each Topical Daily   citalopram   40 mg Oral Daily   docusate sodium  200 mg Oral Daily   doxycycline   100 mg Oral Q12H   metoCLOPramide  (REGLAN ) injection  10 mg Intravenous Q6H   metoprolol  tartrate  12.5 mg Oral BID   Or   metoprolol  tartrate  12.5 mg Per Tube BID   [START ON 10/28/2024] pantoprazole   40 mg Oral Daily   pantoprazole  (PROTONIX ) IV  40 mg Intravenous QHS   sodium chloride  flush  3 mL Intravenous Q12H   Continuous Infusions:  sodium chloride  20 mL/hr at 10/27/24 0700   sodium chloride      sodium chloride  20 mL/hr at 10/27/24 0700   albumin human 999 mL/hr at 10/26/24 1800    ceFAZolin (ANCEF) IV 2 g (10/27/24 0537)   dexmedetomidine (PRECEDEX) IV infusion 0.7 mcg/kg/hr (10/26/24 2100)   epinephrine 1 mcg/min (10/27/24 0700)   insulin  1.1 Units/hr (10/27/24 0700)   lactated ringers      lactated ringers      nitroGLYCERIN  5 mcg/min (10/27/24 0700)   norepinephrine (LEVOPHED) Adult infusion Stopped (10/26/24 1407)   PRN Meds:.sodium chloride , albumin human, albuterol  **AND** budesonide (PULMICORT) nebulizer solution, ALPRAZolam ,  dextrose , metoprolol  tartrate, midazolam  PF, morphine  injection, ondansetron  (ZOFRAN ) IV, oxyCODONE , sodium chloride  flush, traMADol   General appearance: alert, cooperative, and mild distress Neurologic: intact Heart: Regular rate and rhythm.  Monitor shows normal sinus rhythm with a rate of 90.  Pacer attached and backup mode at rate 60/min Lungs: Breath sounds clear, shallow anterior.  Chest x-ray shows no ex unexpected changes, radiologist report notes mild pulmonary edema. Abdomen: Soft, nontender, absent bowel sounds Extremities: All warm and well-perfused.  Left upper extremity excision is covered with a dry dressing.  Left hand is warm with no motor or sensory deficits. Wound: Sternotomy incision is covered  with a dry dressing  Lab Results: CBC: Recent Labs    10/26/24 2024 10/27/24 0348  WBC 18.8* 18.6*  HGB 9.8* 9.8*  HCT 32.4* 33.0*  PLT 233 258   BMET:  Recent Labs    10/26/24 2024 10/27/24 0348  NA 139 140  K 4.2 4.2  CL 106 106  CO2 22 24  GLUCOSE 128* 147*  BUN 12 11  CREATININE 0.47 0.64  CALCIUM  7.6* 8.1*    CMET: Lab Results  Component Value Date   WBC 18.6 (H) 10/27/2024   HGB 9.8 (L) 10/27/2024   HCT 33.0 (L) 10/27/2024   PLT 258 10/27/2024   GLUCOSE 147 (H) 10/27/2024   CHOL 131 09/20/2024   TRIG 306 (H) 09/21/2024   HDL 31 (L) 09/20/2024   LDLDIRECT 69.0 08/11/2019   LDLCALC 51 09/20/2024   ALT 13 10/25/2024   AST 13 (L) 10/25/2024   NA 140 10/27/2024   K 4.2 10/27/2024   CL 106 10/27/2024   CREATININE 0.64 10/27/2024   BUN 11 10/27/2024   CO2 24 10/27/2024   TSH 2.02 09/08/2024   INR 1.3 (H) 10/26/2024   HGBA1C 6.5 (H) 10/25/2024      PT/INR:  Recent Labs    10/26/24 1403  LABPROT 17.1*  INR 1.3*   Radiology: DG Chest Port 1 View Result Date: 10/27/2024 EXAM: 1 VIEW(S) XRAY OF THE CHEST 10/27/2024 05:20:00 AM COMPARISON: 10/26/2024 CLINICAL HISTORY: [id number] S/P CABG x 3 [id number] FINDINGS: LINES, TUBES AND DEVICES: Right IJ sheath with PA catheter tip within the main pulmonary artery. Mediastinal drain noted. Left chest tube in place. Endotracheal and nasogastric tubes removed. LUNGS AND PLEURA: Low lung volumes. Mild pulmonary edema. No focal pulmonary opacity. No pleural effusion. No pneumothorax. HEART AND MEDIASTINUM: Stable cardiomegaly. No acute abnormality of the mediastinal silhouette. BONES AND SOFT TISSUES: Status post median sternotomy and CABG. No acute osseous abnormality. IMPRESSION: 1. Mild pulmonary edema. Electronically signed by: Waddell Calk MD 10/27/2024 06:10 AM EST RP Workstation: [id number]   ECHO INTRAOPERATIVE TEE Result Date: 10/26/2024  *INTRAOPERATIVE TRANSESOPHAGEAL REPORT *  Patient Name:   [name] [name] Date  of Exam: 10/26/2024 Medical Rec #:  [id number]       Height:       68.0 in Accession #:    [id number]      Weight:       184.3 lb Date of Birth:  [birth year]      BSA:          1.97 m Patient Age:    58 years        BP:           124/70 mmHg Patient Gender: F               HR:           94 bpm. Exam  Location:  Inpatient Transesophogeal exam was perform intraoperatively during surgical procedure. Patient was closely monitored under general anesthesia during the entirety of examination. Indications:     CORONARY ARTERY BYPASS GRAFTING (CABG) Sonographer:     Koleen Popper RDCS Performing Phys: [id number]S Husam Hohn Diagnosing Phys: Kelly Mace MD Complications: No known complications during this procedure. POST-OP IMPRESSIONS limited post- CPB exam: The patient separated easily from CPB.  _ Left Ventricle: The left ventricular function remains globally hypokinestic. Function is essentially unchanged from pre-bypass images. There is slight improvement in the anterolateral segment contractility. Overall EF 33%. _ Right Ventricle: The right ventricular function appears normal, unchanged from pre-bypass images. _ Aortic Valve: The aortic valve function appears normal, unchanged from pre-bypass images. _ Mitral Valve: The mitral valve function appears normal, unchanged from pre-bypass images. There is mild Mitral regurgitation. _ Tricuspid Valve: The tricuspid valve appears normal, unchanged from pre-bypass images. PRE-OP FINDINGS  Left Ventricle: The left ventricle has severely reduced systolic function, with an ejection fraction of 20-30%, measured 28%. The cavity size was moderately dilated. Left ventricular diffuse hypokinesis, with inferoseptal akinesis. There is no left ventricular hypertrophy. Left ventricular diastolic function was not evaluated. Right Ventricle: The right ventricle has normal systolic function. The cavity was normal. There is no increase in right ventricular wall thickness. Catheter  present in the right ventricle. Left Atrium: Left atrial size was normal in size. No left atrial/left atrial appendage thrombus was detected. Left atrial appendage velocity is normal at greater than 40 cm/s. Right Atrium: Right atrial size was normal in size. Prominent Eustachian valve. Catheter present in the right atrium. Interatrial Septum: No atrial level shunt detected by color flow Doppler. There is no evidence of a patent foramen ovale. Pericardium: There is no evidence of pericardial effusion. Mitral Valve: The mitral valve is normal in structure. Mitral valve regurgitation is trivial by color flow Doppler. There is no evidence of mitral valve vegetation. Pulmonary venous flow is normal. There is no evidence of mitral stenosis, mean gradient 2  mmHg, peak gradient 4 mmHg. Tricuspid Valve: The tricuspid valve was normal in structure. Tricuspid valve regurgitation was not visualized by color flow Doppler. No evidence of tricuspid stenosis is present. There is no evidence of tricuspid valve vegetation. Aortic Valve: The aortic valve is tricuspid. Aortic valve regurgitation is trivial by color flow Doppler. There is no stenosis of the aortic valve, with peak gradient 12 mmHg, mean gradient 7 mmHg. There is no evidence of aortic valve vegetation. Pulmonic Valve: The pulmonic valve was normal in structure, with normal leaflet excursion. No evidence of pulmonic stenosis. Pulmonic valve regurgitation is trivial by color flow Doppler, around the PA catheter. Aorta: The ascending aorta, aortic arch and aortic root are normal in size and structure. There is evidence of plaque in the descending aorta; Grade I, measuring 1-9mm in size. Pulmonary Artery: Norva Purl catheter present on the right. The pulmonary artery is of normal size. Venous: The inferior vena cava is normal in size with greater than 50% respiratory variability, suggesting right atrial pressure of 3 mmHg. Shunts: There is no evidence of an atrial septal  defect. +-------------+--------++ AORTIC VALVE          +-------------+--------++ AV Mean Grad:7.0 mmHg +-------------+--------++ +-------------+--------++ MITRAL VALVE          +-------------+--------++ MV Mean grad:2.0 mmHg +-------------+--------++  Kelly Mace MD Electronically signed by Kelly Mace MD Signature Date/Time: 10/26/2024/4:13:08 PM    Final    DG Chest Port 1  View Result Date: 10/26/2024 CLINICAL DATA:  Status post CABG. EXAM: PORTABLE CHEST 1 VIEW COMPARISON:  Chest CT dated 10/25/2024. FINDINGS: Endotracheal tube with tip at the level of the carina tilting towards the right mainstem bronchus. Recommend retraction by 4 cm for optimal positioning. Enteric tube extends below diaphragm with tip beyond the inferior margin of the image. Inferiorly accessed left-sided chest tube and mediastinal drains. Right IJ Swan-Ganz with tip in the proximal right main pulmonary artery. Shallow inspiration. No focal consolidation, pleural effusion or pneumothorax. Mild cardiomegaly with mild vascular congestion. No acute osseous pathology. Median sternotomy wires. IMPRESSION: 1. Endotracheal tube with tip at the level of the carina tilting towards the right mainstem bronchus. Recommend retraction by 4 cm for optimal positioning. 2. Mild cardiomegaly with mild vascular congestion. These results will be called to the ordering clinician or representative by the Radiologist Assistant, and communication documented in the PACS or [employer]. Electronically Signed   By: Vanetta Chou M.D.   On: 10/26/2024 14:17     Assessment/Plan: S/P Procedure(s) (LRB): CORONARY ARTERY BYPASS GRAFTING TIMES THREE USING LEFT INTERNAL MAMMARY ARTERY, RIGHT ENDOSCOPICALLY GREATER SAPHENOUS VEIN AND RIGHT RADIAL ARTERY HARVEST. 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  EF 25 to 30% preop and on post-op TEE.  Was on metoprolol , Entresto, and spironolactone  preop.  Stable vital signs, cardiac rhythm, and hemodynamics since surgery.  Currently on epinephrine 3 mics per minute and low-dose nitroglycerin  infusion for the radial artery graft.  Expect we can slowly wean inotropic support today and transition to oral nitrate or CCB for the radial graft.  Chest tube output low, plan removal after she is mobilized.  -Neuro: Intact, pain control reasonable.  -HEME: Mild expected acute blood loss anemia, minimal chest tube output.  Monitoring  -Pulm: Stable respiratory status since extubation.  Encourage pulmonary hygiene   - Endo: History of type 2 diabetes mellitus CBGs well-controlled on insulin  drip using Endo tool.  Transition to long-acting and sliding scale insulin  today.  - GI: Abdomen benign but bowel sounds absent.  Advised her to slowly advance oral intake.  -Renal: Normal function at baseline, diuresis as hemodynamics allow.  - DVT prophylaxis: Mobilize, enoxaparin  subcu daily   Myron G. Roddenberry, PA-C 10/27/2024 7:33 AM  Patient seen and examined, agree with above Looks great Dc Swan and A line, dc epi Norvasc for radial- dc NTG Diurese Resume spironolactone  in 24-48 hours Dc chest tubes and pacing wires Mobilize  Julia Kulzer C. Kerrin, MD [facility] [phone]

## 2024-10-27 NOTE — Progress Notes (Signed)
 1 Day Post-Op  Subjective: General surgery was asked to see the patient for findings of hidradenitis under her R axilla during her CABG yesterday.  The patient states she has had this for 16 years.  She has no complaints of this currently.  She sees her PCP prn if she has any issues with this, but it generally doesn't bother her often.  She does have drainage from time to time.  No other complaints regarding this.   ROS: See above, otherwise other systems negative  Objective: Vital signs in last 24 hours: Temp:  [97.9 F (36.6 C)-99.9 F (37.7 C)] 99 F (37.2 C) (11/06 0700) Pulse Rate:  [86-101] 89 (11/06 0700) Resp:  [11-19] 11 (11/06 0700) BP: (85-118)/(57-73) 110/63 (11/06 0500) SpO2:  [94 %-100 %] 94 % (11/06 0700) Arterial Line BP: (95-140)/(46-68) 132/54 (11/06 0700) FiO2 (%):  [40 %-50 %] 40 % (11/05 1608) Weight:  [87.2 kg] 87.2 kg (11/06 0600) Last BM Date :  (pta)  Intake/Output from previous day: 11/05 0701 - 11/06 0700 In: 3529.6 [P.O.:120; I.V.:2033.9; Blood:355; IV Piggyback:1020.8] Out: 2802 [Urine:2012; Blood:626; Chest Tube:164] Intake/Output this shift: Total I/O In: -  Out: 50 [Urine:50]  PE: Skin: R axilla with chronic skin changes c/w hidradenitis suppurativa.  No evidence of erythema or acute infection.  Small amount of typical, expected drainage noted from fistulae.  Lab Results:  Recent Labs    10/26/24 2024 10/27/24 0348  WBC 18.8* 18.6*  HGB 9.8* 9.8*  HCT 32.4* 33.0*  PLT 233 258   BMET Recent Labs    10/26/24 2024 10/27/24 0348  NA 139 140  K 4.2 4.2  CL 106 106  CO2 22 24  GLUCOSE 128* 147*  BUN 12 11  CREATININE 0.47 0.64  CALCIUM  7.6* 8.1*   PT/INR Recent Labs    10/25/24 1200 10/26/24 1403  LABPROT 13.8 17.1*  INR 1.0 1.3*   CMP     Component Value Date/Time   NA 140 10/27/2024 0348   NA 138 07/04/2024 1122   NA 135 (L) 12/06/2015 1316   K 4.2 10/27/2024 0348   K 3.7 12/06/2015 1316   CL 106 10/27/2024  0348   CO2 24 10/27/2024 0348   CO2 21 (L) 12/06/2015 1316   GLUCOSE 147 (H) 10/27/2024 0348   GLUCOSE 181 (H) 12/06/2015 1316   GLUCOSE 131 (H) 12/08/2006 1022   BUN 11 10/27/2024 0348   BUN 13 07/04/2024 1122   BUN 7.2 12/06/2015 1316   CREATININE 0.64 10/27/2024 0348   CREATININE 0.67 08/20/2023 1528   CREATININE 0.7 12/06/2015 1316   CALCIUM  8.1 (L) 10/27/2024 0348   CALCIUM  9.1 12/06/2015 1316   PROT 7.7 10/25/2024 1200   PROT 7.3 12/06/2015 1316   ALBUMIN 3.5 10/25/2024 1200   ALBUMIN 3.8 12/06/2015 1316   AST 13 (L) 10/25/2024 1200   AST 23 08/20/2023 1528   AST 17 12/06/2015 1316   ALT 13 10/25/2024 1200   ALT 23 08/20/2023 1528   ALT 21 12/06/2015 1316   ALKPHOS 96 10/25/2024 1200   ALKPHOS 99 12/06/2015 1316   BILITOT 1.1 10/25/2024 1200   BILITOT 0.6 08/20/2023 1528   BILITOT 0.63 12/06/2015 1316   GFRNONAA >60 10/27/2024 0348   GFRNONAA >60 08/20/2023 1528   GFRAA 177 04/10/2008 1420   Lipase  No results found for: LIPASE     Studies/Results: DG Chest Port 1 View Result Date: 10/27/2024 EXAM: 1 VIEW(S) XRAY OF THE CHEST 10/27/2024 05:20:00 AM  COMPARISON: 10/26/2024 CLINICAL HISTORY: [id number] S/P CABG x 3 [id number] FINDINGS: LINES, TUBES AND DEVICES: Right IJ sheath with PA catheter tip within the main pulmonary artery. Mediastinal drain noted. Left chest tube in place. Endotracheal and nasogastric tubes removed. LUNGS AND PLEURA: Low lung volumes. Mild pulmonary edema. No focal pulmonary opacity. No pleural effusion. No pneumothorax. HEART AND MEDIASTINUM: Stable cardiomegaly. No acute abnormality of the mediastinal silhouette. BONES AND SOFT TISSUES: Status post median sternotomy and CABG. No acute osseous abnormality. IMPRESSION: 1. Mild pulmonary edema. Electronically signed by: Waddell Calk MD 10/27/2024 06:10 AM EST RP Workstation: [id number]   ECHO INTRAOPERATIVE TEE Result Date: 10/26/2024  *INTRAOPERATIVE TRANSESOPHAGEAL REPORT *  Patient Name:   [name] [name] Date of Exam: 10/26/2024 Medical Rec #:  [id number]       Height:       68.0 in Accession #:    [id number]      Weight:       184.3 lb Date of Birth:  1966-08-18      BSA:          1.97 m Patient Age:    58 years        BP:           124/70 mmHg Patient Gender: F               HR:           94 bpm. Exam Location:  Inpatient Transesophogeal exam was perform intraoperatively during surgical procedure. Patient was closely monitored under general anesthesia during the entirety of examination. Indications:     CORONARY ARTERY BYPASS GRAFTING (CABG) Sonographer:     Koleen Popper RDCS Performing Phys: [id number] ELSPETH BROCKS HENDRICKSON Diagnosing Phys: [place] Decoteau Mace MD Complications: No known complications during this procedure. POST-OP IMPRESSIONS limited post- CPB exam: The patient separated easily from CPB.  _ Left Ventricle: The left ventricular function remains globally hypokinestic. Function is essentially unchanged from pre-bypass images. There is slight improvement in the anterolateral segment contractility. Overall EF 33%. _ Right Ventricle: The right ventricular function appears normal, unchanged from pre-bypass images. _ Aortic Valve: The aortic valve function appears normal, unchanged from pre-bypass images. _ Mitral Valve: The mitral valve function appears normal, unchanged from pre-bypass images. There is mild Mitral regurgitation. _ Tricuspid Valve: The tricuspid valve appears normal, unchanged from pre-bypass images. PRE-OP FINDINGS  Left Ventricle: The left ventricle has severely reduced systolic function, with an ejection fraction of 20-30%, measured 28%. The cavity size was moderately dilated. Left ventricular diffuse hypokinesis, with inferoseptal akinesis. There is no left ventricular hypertrophy. Left ventricular diastolic function was not evaluated. Right Ventricle: The right ventricle has normal systolic function. The cavity was normal. There is no increase in right ventricular wall thickness.  Catheter present in the right ventricle. Left Atrium: Left atrial size was normal in size. No left atrial/left atrial appendage thrombus was detected. Left atrial appendage velocity is normal at greater than 40 cm/s. Right Atrium: Right atrial size was normal in size. Prominent Eustachian valve. Catheter present in the right atrium. Interatrial Septum: No atrial level shunt detected by color flow Doppler. There is no evidence of a patent foramen ovale. Pericardium: There is no evidence of pericardial effusion. Mitral Valve: The mitral valve is normal in structure. Mitral valve regurgitation is trivial by color flow Doppler. There is no evidence of mitral valve vegetation. Pulmonary venous flow is normal. There is no evidence of mitral stenosis, mean gradient 2  mmHg, peak gradient  4 mmHg. Tricuspid Valve: The tricuspid valve was normal in structure. Tricuspid valve regurgitation was not visualized by color flow Doppler. No evidence of tricuspid stenosis is present. There is no evidence of tricuspid valve vegetation. Aortic Valve: The aortic valve is tricuspid. Aortic valve regurgitation is trivial by color flow Doppler. There is no stenosis of the aortic valve, with peak gradient 12 mmHg, mean gradient 7 mmHg. There is no evidence of aortic valve vegetation. Pulmonic Valve: The pulmonic valve was normal in structure, with normal leaflet excursion. No evidence of pulmonic stenosis. Pulmonic valve regurgitation is trivial by color flow Doppler, around the PA catheter. Aorta: The ascending aorta, aortic arch and aortic root are normal in size and structure. There is evidence of plaque in the descending aorta; Grade I, measuring 1-65mm in size. Pulmonary Artery: Norva Purl catheter present on the right. The pulmonary artery is of normal size. Venous: The inferior vena cava is normal in size with greater than 50% respiratory variability, suggesting right atrial pressure of 3 mmHg. Shunts: There is no evidence of an atrial  septal defect. +-------------+--------++ AORTIC VALVE          +-------------+--------++ AV Mean Grad:7.0 mmHg +-------------+--------++ +-------------+--------++ MITRAL VALVE          +-------------+--------++ MV Mean grad:2.0 mmHg +-------------+--------++  Anice Wilshire Mace MD Electronically signed by Karigan Cloninger Mace MD Signature Date/Time: 10/26/2024/4:13:08 PM    Final    DG Chest Port 1 View Result Date: 10/26/2024 CLINICAL DATA:  Status post CABG. EXAM: PORTABLE CHEST 1 VIEW COMPARISON:  Chest CT dated 10/25/2024. FINDINGS: Endotracheal tube with tip at the level of the carina tilting towards the right mainstem bronchus. Recommend retraction by 4 cm for optimal positioning. Enteric tube extends below diaphragm with tip beyond the inferior margin of the image. Inferiorly accessed left-sided chest tube and mediastinal drains. Right IJ Swan-Ganz with tip in the proximal right main pulmonary artery. Shallow inspiration. No focal consolidation, pleural effusion or pneumothorax. Mild cardiomegaly with mild vascular congestion. No acute osseous pathology. Median sternotomy wires. IMPRESSION: 1. Endotracheal tube with tip at the level of the carina tilting towards the right mainstem bronchus. Recommend retraction by 4 cm for optimal positioning. 2. Mild cardiomegaly with mild vascular congestion. These results will be called to the ordering clinician or representative by the Radiologist Assistant, and communication documented in the PACS or [employer]. Electronically Signed   By: Vanetta Chou M.D.   On: 10/26/2024 14:17   DG Chest 2 View Result Date: 10/26/2024 EXAM: 2 VIEW(S) XRAY OF THE CHEST 10/25/2024 12:05:00 PM COMPARISON: 09/19/2024 CLINICAL HISTORY: Pre-op evaluation FINDINGS: LUNGS AND PLEURA: No focal pulmonary opacity. No pulmonary edema. No pleural effusion. No pneumothorax. HEART AND MEDIASTINUM: No acute abnormality of the cardiac and mediastinal silhouettes. BONES AND  SOFT TISSUES: Thoracic degenerative changes. IMPRESSION: 1. No acute cardiopulmonary process detected. Electronically signed by: Waddell Calk MD 10/26/2024 06:25 AM EST RP Workstation: [id number]   VAS US  DOPPLER PRE CABG Result Date: 10/25/2024 PREOPERATIVE VASCULAR EVALUATION Patient Name:  TWANIA BUJAK  Date of Exam:   10/25/2024 Medical Rec #: [id number]        Accession #:    [id number] Date of Birth: [birth year]       Patient Gender: F Patient Age:   89 years Exam Location:  [facility] Procedure:      VAS US  DOPPLER PRE CABG Referring Phys: ELSPETH HENDRICKSON --------------------------------------------------------------------------------  Indications:      Coronary artery disease dye to lipid plaque.  Comparison Study: No prior exam. Performing Technologist: Edilia Elden Appl  Examination Guidelines: A complete evaluation includes B-mode imaging, spectral Doppler, color Doppler, and power Doppler as needed of all accessible portions of each vessel. Bilateral testing is considered an integral part of a complete examination. Limited examinations for reoccurring indications may be performed as noted.  Right Carotid Findings: +----------+--------+--------+--------+-----------------------+--------+           PSV cm/sEDV cm/sStenosisDescribe               Comments +----------+--------+--------+--------+-----------------------+--------+ CCA Prox  120     15              homogeneous and smooth          +----------+--------+--------+--------+-----------------------+--------+ CCA Distal96      21              heterogenous and smooth         +----------+--------+--------+--------+-----------------------+--------+ ICA Prox  72      17              homogeneous                     +----------+--------+--------+--------+-----------------------+--------+ ICA Mid   61      22                                               +----------+--------+--------+--------+-----------------------+--------+ ICA Distal50      20                                              +----------+--------+--------+--------+-----------------------+--------+ ECA       100     14                                              +----------+--------+--------+--------+-----------------------+--------+ +----------+--------+-------+----------------+------------+           PSV cm/sEDV cmsDescribe        Arm Pressure +----------+--------+-------+----------------+------------+ Subclavian104     15     Multiphasic, [id number]          +----------+--------+-------+----------------+------------+ +---------+--------+--+--------+--+---------+ VertebralPSV cm/s67EDV cm/s22Antegrade +---------+--------+--+--------+--+---------+ Left Carotid Findings: +----------+--------+--------+--------+----------------------+--------+           PSV cm/sEDV cm/sStenosisDescribe              Comments +----------+--------+--------+--------+----------------------+--------+ CCA Prox  107     28              homogeneous                    +----------+--------+--------+--------+----------------------+--------+ CCA Mid   147     40              homogeneous and smooth         +----------+--------+--------+--------+----------------------+--------+ CCA Distal151     48              homogeneous and smooth         +----------+--------+--------+--------+----------------------+--------+ ICA Prox  121     30              homogeneous and smooth         +----------+--------+--------+--------+----------------------+--------+  ICA Mid   121     34                                             +----------+--------+--------+--------+----------------------+--------+ ICA Distal107     43                                             +----------+--------+--------+--------+----------------------+--------+ ECA       76      14                                              +----------+--------+--------+--------+----------------------+--------+ +----------+--------+--------+----------------+------------+ SubclavianPSV cm/sEDV cm/sDescribe        Arm Pressure +----------+--------+--------+----------------+------------+           91              Multiphasic, [id number]          +----------+--------+--------+----------------+------------+ +---------+--------+--+--------+--+---------+ VertebralPSV cm/s54EDV cm/s15Antegrade +---------+--------+--+--------+--+---------+  ABI Findings: +------------------+-----+---------+ Rt Pressure (mmHg)IndexWaveform  +------------------+-----+---------+ 134                    triphasic +------------------+-----+---------+ 139               1.04 triphasic +------------------+-----+---------+ 123               0.92 triphasic +------------------+-----+---------+ 46                0.34 Abnormal  +------------------+-----+---------+ +------------------+-----+---------+ Lt Pressure (mmHg)IndexWaveform  +------------------+-----+---------+ 132                    triphasic +------------------+-----+---------+ 134               1.00 triphasic +------------------+-----+---------+ 122               0.91 triphasic +------------------+-----+---------+ 65                0.49 Normal    +------------------+-----+---------+ +-------+---------------+ ABI/TBIToday's ABI/TBI +-------+---------------+ Right  1.04/ 0.34      +-------+---------------+ Left   1.00/ 0.49      +-------+---------------+  Right Doppler Findings: +--------+--------+---------+ Site    PressureDoppler   +--------+--------+---------+ [id number]     triphasic +--------+--------+---------+ Radial          triphasic +--------+--------+---------+ Ulnar           triphasic +--------+--------+---------+  Left Doppler Findings: +--------+--------+---------+ Site    PressureDoppler    +--------+--------+---------+ [id number]     triphasic +--------+--------+---------+ Radial          triphasic +--------+--------+---------+ Ulnar           triphasic +--------+--------+---------+   Summary: Right Carotid: Velocities in the right ICA are consistent with a 1-39% stenosis. Left Carotid: Velocities in the left ICA are consistent with a 40-59% stenosis. Vertebrals:  Bilateral vertebral arteries demonstrate antegrade flow. Subclavians: Normal flow hemodynamics were seen in bilateral subclavian              arteries. Right ABI: Resting right ankle-brachial index is within normal range. The right toe-brachial index is abnormal. Left ABI: Resting left ankle-brachial index is within normal range. The left toe-brachial  index is abnormal. Right Upper Extremity: Doppler waveform obliterate with right radial compression. Doppler waveform obliterate with right ulnar compression. Left Upper Extremity: Doppler waveforms decrease 50% with left radial compression. Doppler waveforms decrease >50% with left ulnar compression.  Electronically signed by Penne Colorado MD on 10/25/2024 at 3:35:50 PM.    Final     Anti-infectives: Anti-infectives (From admission, onward)    Start     Dose/Rate Route Frequency Ordered Stop   10/26/24 2100  vancomycin (VANCOCIN) IVPB 1000 mg/200 mL premix        1,000 mg 200 mL/hr over 60 Minutes Intravenous  Once 10/26/24 1347 10/26/24 2202   10/26/24 1400  ceFAZolin (ANCEF) IVPB 2g/100 mL premix        2 g 200 mL/hr over 30 Minutes Intravenous Every 8 hours 10/26/24 1347 10/28/24 1359   10/26/24 1400  doxycycline  (VIBRA -TABS) tablet 100 mg        100 mg Oral Every 12 hours 10/26/24 1347 11/02/24 0959   10/26/24 0400  Vancomycin (VANCOCIN) 1,500 mg in sodium chloride  0.9 % 500 mL IVPB  Status:  Discontinued        1,500 mg 250 mL/hr over 120 Minutes Intravenous To Surgery 10/25/24 0943 10/25/24 1443   10/26/24 0400  ceFAZolin (ANCEF) IVPB 2g/100 mL premix         2 g 200 mL/hr over 30 Minutes Intravenous To Surgery 10/25/24 0943 10/26/24 0945   10/26/24 0400  ceFAZolin (ANCEF) IVPB 2g/100 mL premix        2 g 200 mL/hr over 30 Minutes Intravenous To Surgery 10/25/24 0943 10/26/24 1317   10/26/24 0400  vancomycin (VANCOREADY) IVPB 1250 mg/250 mL        1,250 mg 166.7 mL/hr over 90 Minutes Intravenous To Surgery 10/25/24 1443 10/26/24 1045        Assessment/Plan Chronic R axillary hidradenitis suppurativa -no evidence of acute infection -this is chronic for over 16 yrs -given CABG surgery, will treat for 7 days with doxycycline  to minimize risk of infection during post op period as well as to minimize drainage -I have sent a script for her for clindagel as an outpatient to topically place on this PRN -she could follow up in our office for further management if she desires, but she seems well adjusted to this process and is comfortable with it at this time -no further general surgery needs at this time.  We will sign off.    I reviewed last 24 h vitals and pain scores, last 48 h intake and output, last 24 h labs and trends, last 24 h imaging results, and TCTS notes.   LOS: 1 day    Burnard FORBES Banter , [facility] Surgery 10/27/2024, 8:15 AM Please see Amion for pager number during day hours 7:00am-4:30pm or 7:00am -11:30am on weekends

## 2024-10-28 ENCOUNTER — Inpatient Hospital Stay (HOSPITAL_COMMUNITY)

## 2024-10-28 LAB — BASIC METABOLIC PANEL WITH GFR
Anion gap: 9 (ref 5–15)
BUN: 16 mg/dL (ref 6–20)
CO2: 24 mmol/L (ref 22–32)
Calcium: 8.1 mg/dL — ABNORMAL LOW (ref 8.9–10.3)
Chloride: 102 mmol/L (ref 98–111)
Creatinine, Ser: 0.65 mg/dL (ref 0.44–1.00)
GFR, Estimated: >60 mL/min (ref >60)
Glucose, Bld: 138 mg/dL — ABNORMAL HIGH (ref 70–99)
Potassium: 3.9 mmol/L (ref 3.5–5.1)
Sodium: 135 mmol/L (ref 135–145)

## 2024-10-28 LAB — GLUCOSE, CAPILLARY
Glucose-Capillary: 130 mg/dL — ABNORMAL HIGH (ref 70–99)
Glucose-Capillary: 140 mg/dL — ABNORMAL HIGH (ref 70–99)
Glucose-Capillary: 153 mg/dL — ABNORMAL HIGH (ref 70–99)
Glucose-Capillary: 135 mg/dL — ABNORMAL HIGH (ref 70–99)
Glucose-Capillary: 102 mg/dL — ABNORMAL HIGH (ref 70–99)

## 2024-10-28 LAB — CBC
HCT: 29.7 % — ABNORMAL LOW (ref 36.0–46.0)
Hemoglobin: 8.8 g/dL — ABNORMAL LOW (ref 12.0–15.0)
MCH: 25.9 pg — ABNORMAL LOW (ref 26.0–34.0)
MCHC: 29.6 g/dL — ABNORMAL LOW (ref 30.0–36.0)
MCV: 87.4 fL (ref 80.0–100.0)
Platelets: 183 K/uL (ref 150–400)
RBC: 3.4 MIL/uL — ABNORMAL LOW (ref 3.87–5.11)
RDW: 15.3 % (ref 11.5–15.5)
WBC: 11.5 K/uL — ABNORMAL HIGH (ref 4.0–10.5)
nRBC: 0 % (ref 0.0–0.2)

## 2024-10-28 MED ORDER — INSULIN ASPART 100 UNIT/ML IJ SOLN: 0.0000 [IU] | Freq: Three times a day (TID) | INTRAMUSCULAR | Status: DC

## 2024-10-28 MED ORDER — ALUM & MAG HYDROXIDE-SIMETH 200-200-20 MG/5ML PO SUSP: 15.0000 mL | Freq: Four times a day (QID) | ORAL | Status: DC | PRN

## 2024-10-28 MED ORDER — ~~LOC~~ CARDIAC SURGERY, PATIENT & FAMILY EDUCATION: Freq: Once | Status: DC

## 2024-10-28 MED ORDER — MAGNESIUM HYDROXIDE 400 MG/5ML PO SUSP: 30.0000 mL | Freq: Every day | ORAL | Status: DC | PRN

## 2024-10-28 MED ORDER — FUROSEMIDE 40 MG PO TABS
60.0000 mg | ORAL_TABLET | Freq: Every day | ORAL | Status: DC
  Administered 2024-10-28: 60 mg via ORAL
  Filled 2024-10-28: qty 1

## 2024-10-28 MED ORDER — INSULIN GLARGINE-YFGN 100 UNIT/ML ~~LOC~~ SOLN
25.0000 [IU] | Freq: Every day | SUBCUTANEOUS | Status: DC
  Administered 2024-10-28 – 2024-10-29 (×2): 25 [IU] via SUBCUTANEOUS
  Filled 2024-10-28 (×3): qty 0.25

## 2024-10-28 MED ORDER — INSULIN ASPART 100 UNIT/ML IJ SOLN
0.0000 [IU] | Freq: Three times a day (TID) | INTRAMUSCULAR | Status: DC
  Administered 2024-10-28: 3 [IU] via SUBCUTANEOUS
  Administered 2024-10-28: 2 [IU] via SUBCUTANEOUS
  Filled 2024-10-28: qty 2
  Filled 2024-10-28: qty 3

## 2024-10-28 MED ORDER — SODIUM CHLORIDE 0.9 % IV SOLN: 250.0000 mL | INTRAVENOUS | Status: AC | PRN

## 2024-10-28 MED ORDER — FENTANYL CITRATE (PF) 50 MCG/ML IJ SOSY: 25.0000 ug | PREFILLED_SYRINGE | Freq: Once | INTRAMUSCULAR | Status: DC

## 2024-10-28 MED ORDER — SODIUM CHLORIDE 0.9% FLUSH
3.0000 mL | Freq: Two times a day (BID) | INTRAVENOUS | Status: DC
  Administered 2024-10-28 – 2024-10-29 (×2): 3 mL via INTRAVENOUS

## 2024-10-28 MED ORDER — METFORMIN HCL 500 MG PO TABS
500.0000 mg | ORAL_TABLET | Freq: Two times a day (BID) | ORAL | Status: DC
  Administered 2024-10-28 – 2024-10-29 (×4): 500 mg via ORAL
  Filled 2024-10-28 (×6): qty 1

## 2024-10-28 MED ORDER — INSULIN ASPART 100 UNIT/ML IJ SOLN: 0.0000 [IU] | Freq: Every day | INTRAMUSCULAR | Status: DC

## 2024-10-28 MED ORDER — SPIRONOLACTONE 25 MG PO TABS
25.0000 mg | ORAL_TABLET | Freq: Every day | ORAL | Status: DC
  Administered 2024-10-28 – 2024-10-29 (×2): 25 mg via ORAL
  Filled 2024-10-28 (×2): qty 1

## 2024-10-28 MED ORDER — SODIUM CHLORIDE 0.9% FLUSH
3.0000 mL | INTRAVENOUS | Status: DC | PRN
  Administered 2024-10-29: 3 mL via INTRAVENOUS

## 2024-10-28 NOTE — Progress Notes (Signed)
 2 Days Post-Op Procedure(s) (LRB): CORONARY ARTERY BYPASS GRAFTING TIMES THREE USING LEFT INTERNAL MAMMARY ARTERY, RIGHT ENDOSCOPICALLY GREATER SAPHENOUS VEIN AND RIGHT RADIAL ARTERY HARVEST. (N/A) SURGICAL PROCUREMENT, ARTERY, RADIAL (Left) ECHOCARDIOGRAM, TRANSESOPHAGEAL, INTRAOPERATIVE (N/A) Subjective: No complaints, up in chair  Objective: Vital signs in last 24 hours: Temp:  [97.8 F (36.6 C)-99.3 F (37.4 C)] 97.8 F (36.6 C) (11/07 0318) Pulse Rate:  [85-95] 86 (11/07 0700) Cardiac Rhythm: Normal sinus rhythm (11/06 2000) Resp:  [12-22] 19 (11/07 0700) BP: (99-129)/(54-105) 120/65 (11/07 0600) SpO2:  [86 %-97 %] 95 % (11/07 0700) Arterial Line BP: (122-153)/(50-85) 153/85 (11/06 1015) Weight:  [88.3 kg] 88.3 kg (11/07 0526)  Hemodynamic parameters for last 24 hours: PAP: (29-38)/(12-24) 37/20 CVP:  [8 mmHg-18 mmHg] 11 mmHg  Intake/Output from previous day: 11/06 0701 - 11/07 0700 In: 383.5 [I.V.:383.5] Out: 1014 [Urine:964; Chest Tube:50] Intake/Output this shift: No intake/output data recorded.  General appearance: alert, cooperative, and no distress Neurologic: intact Heart: regular rate and rhythm Lungs: diminished breath sounds bibasilar Abdomen: normal findings: soft, non-tender  Lab Results: Recent Labs    10/27/24 1653 10/28/24 0509  WBC 21.7* 11.5*  HGB 9.8* 8.8*  HCT 32.6* 29.7*  PLT 272 183   BMET:  Recent Labs    10/27/24 1653 10/28/24 0509  NA 137 135  K 4.3 3.9  CL 102 102  CO2 24 24  GLUCOSE 162* 138*  BUN 14 16  CREATININE 0.76 0.65  CALCIUM  8.5* 8.1*    PT/INR:  Recent Labs    10/26/24 1403  LABPROT 17.1*  INR 1.3*   ABG    Component Value Date/Time   PHART 7.295 (L) 10/26/2024 1754   HCO3 24.6 10/26/2024 1754   TCO2 26 10/26/2024 1754   ACIDBASEDEF 2.0 10/26/2024 1754   O2SAT 98 10/26/2024 1754   CBG (last 3)  Recent Labs    10/27/24 1941 10/27/24 2332 10/28/24 0349  GLUCAP 175* 136* 130*     Assessment/Plan: S/P Procedure(s) (LRB): CORONARY ARTERY BYPASS GRAFTING TIMES THREE USING LEFT INTERNAL MAMMARY ARTERY, RIGHT ENDOSCOPICALLY GREATER SAPHENOUS VEIN AND RIGHT RADIAL ARTERY HARVEST. (N/A) SURGICAL PROCUREMENT, ARTERY, RADIAL (Left) ECHOCARDIOGRAM, TRANSESOPHAGEAL, INTRAOPERATIVE (N/A) POD # 2 CABG Looks great NEURO- intact CV- in Sr with normal BP  ASA, statin, beta blocker  Resume spironolactone   Resume entresto in a day or two RESP- continue IS RENAL- creatinine and lytes oK  Change to PO Lasix, resume spironolactone  ENDO- CBG oK  Resume metformin   Change SSI to [facility] and HS GI- tolerating diet Cardiac rehab SCD + enoxaparin  Transfer to 4E    LOS: 2 days    Elspeth JAYSON Millers 10/28/2024

## 2024-10-28 NOTE — Progress Notes (Signed)
 Pt received from 2H, alert and ambulatory.  VSS, CCMD notified, Pt oriented to unit, and plan of care reviewed.  Will to monitor

## 2024-10-29 ENCOUNTER — Inpatient Hospital Stay (HOSPITAL_COMMUNITY)

## 2024-10-29 LAB — BASIC METABOLIC PANEL WITH GFR
Anion gap: 11 (ref 5–15)
BUN: 22 mg/dL — ABNORMAL HIGH (ref 6–20)
CO2: 24 mmol/L (ref 22–32)
Calcium: 8.7 mg/dL — ABNORMAL LOW (ref 8.9–10.3)
Chloride: 102 mmol/L (ref 98–111)
Creatinine, Ser: 0.69 mg/dL (ref 0.44–1.00)
GFR, Estimated: >60 mL/min (ref >60)
Glucose, Bld: 127 mg/dL — ABNORMAL HIGH (ref 70–99)
Potassium: 3.9 mmol/L (ref 3.5–5.1)
Sodium: 137 mmol/L (ref 135–145)

## 2024-10-29 LAB — CBC
HCT: 28.5 % — ABNORMAL LOW (ref 36.0–46.0)
Hemoglobin: 8.6 g/dL — ABNORMAL LOW (ref 12.0–15.0)
MCH: 25.4 pg — ABNORMAL LOW (ref 26.0–34.0)
MCHC: 30.2 g/dL (ref 30.0–36.0)
MCV: 84.3 fL (ref 80.0–100.0)
Platelets: 189 K/uL (ref 150–400)
RBC: 3.38 MIL/uL — ABNORMAL LOW (ref 3.87–5.11)
RDW: 15.2 % (ref 11.5–15.5)
WBC: 8.1 K/uL (ref 4.0–10.5)
nRBC: 0 % (ref 0.0–0.2)

## 2024-10-29 LAB — GLUCOSE, CAPILLARY
Glucose-Capillary: 117 mg/dL — ABNORMAL HIGH (ref 70–99)
Glucose-Capillary: 126 mg/dL — ABNORMAL HIGH (ref 70–99)
Glucose-Capillary: 98 mg/dL (ref 70–99)
Glucose-Capillary: 58 mg/dL — ABNORMAL LOW (ref 70–99)
Glucose-Capillary: 74 mg/dL (ref 70–99)

## 2024-10-29 MED ORDER — FUROSEMIDE 10 MG/ML IJ SOLN
40.0000 mg | Freq: Once | INTRAMUSCULAR | Status: AC
  Administered 2024-10-29: 40 mg via INTRAVENOUS
  Filled 2024-10-29: qty 4

## 2024-10-29 MED ORDER — FUROSEMIDE 40 MG PO TABS
60.0000 mg | ORAL_TABLET | Freq: Every day | ORAL | Status: DC
  Administered 2024-10-29: 60 mg via ORAL
  Filled 2024-10-29: qty 1

## 2024-10-29 NOTE — Plan of Care (Signed)

## 2024-10-29 NOTE — Progress Notes (Addendum)
 [address] [employer] [address]             [phone]      3 Days Post-Op Procedure(s) (LRB): CORONARY ARTERY BYPASS GRAFTING TIMES THREE USING LEFT INTERNAL MAMMARY ARTERY, RIGHT ENDOSCOPICALLY GREATER SAPHENOUS VEIN AND RIGHT RADIAL ARTERY HARVEST. (N/A) SURGICAL PROCUREMENT, ARTERY, RADIAL (Left) ECHOCARDIOGRAM, TRANSESOPHAGEAL, INTRAOPERATIVE (N/A) Subjective: Patient reports she feels good and wishes she could go home today  Objective: Vital signs in last 24 hours: Temp:  [97.4 F (36.3 C)-98.2 F (36.8 C)] 98.2 F (36.8 C) (11/08 0317) Pulse Rate:  [85-95] 88 (11/08 0317) Cardiac Rhythm: Normal sinus rhythm;Bundle branch block (11/07 2058) Resp:  [12-22] 20 (11/08 0600) BP: (104-129)/(50-80) 123/58 (11/08 0317) SpO2:  [92 %-98 %] 98 % (11/08 0317) Weight:  [88.2 kg] 88.2 kg (11/08 0600)  Hemodynamic parameters for last 24 hours:    Intake/Output from previous day: 11/07 0701 - 11/08 0700 In: 120 [P.O.:120] Out: 250 [Urine:250] Intake/Output this shift: Total I/O In: 120 [P.O.:120] Out: -   General appearance: alert, cooperative, and no distress Neurologic: intact Heart: regular rate and rhythm, S1, S2 normal, no murmur, click, rub or gallop Lungs: slightly diminished bibasilar breath sounds worse on the left side Abdomen: soft, non-tender; bowel sounds normal; no masses,  no organomegaly Extremities: edema trace-1+ Wound: Clean and dry without sign of infection  Lab Results: Recent Labs    10/28/24 0509 10/29/24 0327  WBC 11.5* 8.1  HGB 8.8* 8.6*  HCT 29.7* 28.5*  PLT 183 189   BMET:  Recent Labs    10/28/24 0509 10/29/24 0327  NA 135 137  K 3.9 3.9  CL 102 102  CO2 24 24  GLUCOSE 138* 127*  BUN 16 22*  CREATININE 0.65 0.69  CALCIUM  8.1* 8.7*    PT/INR:  Recent Labs    10/26/24 1403  LABPROT 17.1*  INR 1.3*   ABG    Component Value Date/Time   PHART 7.295 (L) 10/26/2024 1754   HCO3 24.6 10/26/2024  1754   TCO2 26 10/26/2024 1754   ACIDBASEDEF 2.0 10/26/2024 1754   O2SAT 98 10/26/2024 1754   CBG (last 3)  Recent Labs    10/28/24 1648 10/28/24 2133 10/29/24 0604  GLUCAP 135* 102* 117*    Assessment/Plan: S/P Procedure(s) (LRB): CORONARY ARTERY BYPASS GRAFTING TIMES THREE USING LEFT INTERNAL MAMMARY ARTERY, RIGHT ENDOSCOPICALLY GREATER SAPHENOUS VEIN AND RIGHT RADIAL ARTERY HARVEST. (N/A) SURGICAL PROCUREMENT, ARTERY, RADIAL (Left) ECHOCARDIOGRAM, TRANSESOPHAGEAL, INTRAOPERATIVE (N/A)  Neuro: Anxiety on home wellbutrin  and celexa   CV: HFrEF, preop EF 25-30%. On ASA 325mg , Lopressor  12.5mg  BID, norvasc 5mg  daily for radial conduit, and spironolactone  25mg  daily. SBP 110s-120s. Will plan to restart Entresto tomorrow if BP will tolerate. NSR, HR 80s-90s.   Pulm: Saturating well on RA this AM. CXR with improve pulmonary edema and bibasilar atelectasis. Encourage IS and ambulation.   GI: +BM yesterday. Tolerating a diet.   Endo: T2DM, preop A1C 6.5. CBGs controlled on Metformin , repaglinide , semglee  25U daily, novolog  4U TID and SSI. Will restart home glyxambi  and semaglutide  at discharge  Renal: Cr 0.69, stable. K 3.9, supplement. UO 250cc/24hrs recorded. +10lbs from preop weight. Will give IV Lasix and PO Lasix today.  ID: Likely reactive leukocytosis resolved. Afebrile.   Expected postop ABLA: H/H 8.6/28.5, not clinically significant at this time.   DVT Prophylaxis: Lovenox   Hidradenitis: Chronic, general surgery has sent in clindamycin  gel for her to use  as an outpatient. Continue 1 weeks worth of PO doxycycline . She will follow up with PCP as needed  Dispo: Patient looks great, continue ambulation. Work on diuresis. Hopefully home in about 48 hours.    LOS: 3 days    Con GORMAN Bend, PA-C 10/29/2024 Patient seen and examined, agree with above Diurese as noted above Likely home tomorrow  Elspeth BROCKS. Kerrin, MD [facility] [phone]

## 2024-10-29 NOTE — Progress Notes (Signed)
 CARDIAC REHAB PHASE I     Post [facility] education including site care, restrictions, heart healthy diabetic diet, sternal precautions, IS use at home, home needs at discharge, exercise guidelines and CRP2 reviewed. All questions and concerns addressed. Will refer to [facility] for CRP2.    Vaughn Asberry Hacking, RN BSN 10/29/2024 10:45 AM

## 2024-10-30 LAB — GLUCOSE, CAPILLARY
Glucose-Capillary: 116 mg/dL — ABNORMAL HIGH (ref 70–99)
Glucose-Capillary: 140 mg/dL — ABNORMAL HIGH (ref 70–99)
Glucose-Capillary: 68 mg/dL — ABNORMAL LOW (ref 70–99)

## 2024-10-30 LAB — BASIC METABOLIC PANEL WITH GFR
Anion gap: 9 (ref 5–15)
BUN: 19 mg/dL (ref 6–20)
CO2: 24 mmol/L (ref 22–32)
Calcium: 8.7 mg/dL — ABNORMAL LOW (ref 8.9–10.3)
Chloride: 104 mmol/L (ref 98–111)
Creatinine, Ser: 0.59 mg/dL (ref 0.44–1.00)
GFR, Estimated: >60 mL/min (ref >60)
Glucose, Bld: 128 mg/dL — ABNORMAL HIGH (ref 70–99)
Potassium: 3.3 mmol/L — ABNORMAL LOW (ref 3.5–5.1)
Sodium: 137 mmol/L (ref 135–145)

## 2024-10-30 MED ORDER — ACETAMINOPHEN 325 MG PO TABS: 650.0000 mg | ORAL_TABLET | Freq: Four times a day (QID) | ORAL | Status: AC | PRN

## 2024-10-30 MED ORDER — POTASSIUM CHLORIDE CRYS ER 20 MEQ PO TBCR: 40.0000 meq | EXTENDED_RELEASE_TABLET | Freq: Once | ORAL | Status: DC

## 2024-10-30 MED ORDER — OXYCODONE HCL 5 MG PO TABS: 5.0000 mg | ORAL_TABLET | Freq: Four times a day (QID) | ORAL | 0 refills | Status: AC | PRN

## 2024-10-30 MED ORDER — SACUBITRIL-VALSARTAN 24-26 MG PO TABS: 1.0000 | ORAL_TABLET | Freq: Two times a day (BID) | ORAL | Status: DC

## 2024-10-30 MED ORDER — SACUBITRIL-VALSARTAN 24-26 MG PO TABS: 1.0000 | ORAL_TABLET | Freq: Two times a day (BID) | ORAL | 1 refills | Status: DC

## 2024-10-30 MED ORDER — DOXYCYCLINE HYCLATE 100 MG PO TABS: 100.0000 mg | ORAL_TABLET | Freq: Two times a day (BID) | ORAL | 0 refills | Status: AC

## 2024-10-30 MED ORDER — AMLODIPINE BESYLATE 5 MG PO TABS: 5.0000 mg | ORAL_TABLET | Freq: Every day | ORAL | 1 refills | Status: DC

## 2024-10-30 MED ORDER — ASPIRIN 325 MG PO TBEC: 325.0000 mg | DELAYED_RELEASE_TABLET | Freq: Every day | ORAL | Status: AC

## 2024-10-30 MED ORDER — METOPROLOL TARTRATE 25 MG PO TABS: 12.5000 mg | ORAL_TABLET | Freq: Two times a day (BID) | ORAL | 1 refills | Status: DC

## 2024-10-30 NOTE — Progress Notes (Signed)
 DISCHARGE NOTE HOME [name] [name] to be discharged Home per MD order. Discussed prescriptions and follow up appointments with the patient. Prescriptions given to patient; medication list explained in detail. Patient verbalized understanding.  Skin clean, dry and intact without evidence of skin break down, no evidence of skin tears noted. IV catheter discontinued intact. Site without signs and symptoms of complications. Dressing and pressure applied. Pt denies pain at the site currently. No complaints noted.  Patient free of lines, drains, and wounds.   An After Visit Summary (AVS) was printed and given to the patient. Patient escorted via wheelchair, and discharged home via private auto.  Jhovanny Guinta K Mercedes Valeriano, RN

## 2024-10-30 NOTE — Progress Notes (Signed)
 Pt's HS blood sugar was 58 asymptomatic, pt given 30 grams of carbs only ate about 15. Rechecked blood sugar was 74. Rechecked at midnight, BS was 68, additional  15 grams of carbs given pt will not eat anything more substantial . Blood sugar rechecked 140

## 2024-10-30 NOTE — Progress Notes (Signed)
 [address] [employer] [address]             [phone]      4 Days Post-Op Procedure(s) (LRB): CORONARY ARTERY BYPASS GRAFTING TIMES THREE USING LEFT INTERNAL MAMMARY ARTERY, RIGHT ENDOSCOPICALLY GREATER SAPHENOUS VEIN AND RIGHT RADIAL ARTERY HARVEST. (N/A) SURGICAL PROCUREMENT, ARTERY, RADIAL (Left) ECHOCARDIOGRAM, TRANSESOPHAGEAL, INTRAOPERATIVE (N/A) Subjective: Patient with no new complaints this AM  Objective: Vital signs in last 24 hours: Temp:  [97.7 F (36.5 C)-98.5 F (36.9 C)] 98.4 F (36.9 C) (11/09 0428) Pulse Rate:  [87-101] 94 (11/09 0529) Cardiac Rhythm: Normal sinus rhythm;Bundle branch block (11/08 2038) Resp:  [13-20] 18 (11/09 0529) BP: (113-143)/(53-95) 143/67 (11/09 0428) SpO2:  [90 %-95 %] 90 % (11/09 0428) Weight:  [86.8 kg] 86.8 kg (11/09 0529)  Hemodynamic parameters for last 24 hours:    Intake/Output from previous day: 11/08 0701 - 11/09 0700 In: 360 [P.O.:360] Out: 1850 [Urine:1850] Intake/Output this shift: No intake/output data recorded.  General appearance: alert, cooperative, and no distress Neurologic: intact Heart: regular rate and rhythm, S1, S2 normal, no murmur, click, rub or gallop Lungs: slightly diminished bibasilar breath sounds Abdomen: soft, non-tender; bowel sounds normal; no masses,  no organomegaly Extremities: edema trace BLE Wound: Clean and dry without sign of infection  Lab Results: Recent Labs    10/28/24 0509 10/29/24 0327  WBC 11.5* 8.1  HGB 8.8* 8.6*  HCT 29.7* 28.5*  PLT 183 189   BMET:  Recent Labs    10/29/24 0327 10/30/24 0246  NA 137 137  K 3.9 3.3*  CL 102 104  CO2 24 24  GLUCOSE 127* 128*  BUN 22* 19  CREATININE 0.69 0.59  CALCIUM  8.7* 8.7*    PT/INR: No results for input(s): LABPROT, INR in the last 72 hours. ABG    Component Value Date/Time   PHART 7.295 (L) 10/26/2024 1754   HCO3 24.6 10/26/2024 1754   TCO2 26 10/26/2024 1754   ACIDBASEDEF 2.0  10/26/2024 1754   O2SAT 98 10/26/2024 1754   CBG (last 3)  Recent Labs    10/30/24 0003 10/30/24 0123 10/30/24 0607  GLUCAP 68* 140* 116*    Assessment/Plan: S/P Procedure(s) (LRB): CORONARY ARTERY BYPASS GRAFTING TIMES THREE USING LEFT INTERNAL MAMMARY ARTERY, RIGHT ENDOSCOPICALLY GREATER SAPHENOUS VEIN AND RIGHT RADIAL ARTERY HARVEST. (N/A) SURGICAL PROCUREMENT, ARTERY, RADIAL (Left) ECHOCARDIOGRAM, TRANSESOPHAGEAL, INTRAOPERATIVE (N/A)  Neuro: Anxiety on home wellbutrin  and celexa    CV: HFrEF, preop EF 25-30%. On ASA 325mg , Lopressor  12.5mg  BID, Norvasc 5mg  daily for radial conduit, and Spironolactone  25mg  daily. SBP mostly 120s-130s, but up to 143 this AM. NSR, HR 80s-90s. Patient was not taking Entresto prior to surgery because insurance had not approved it. Will start low dose Entresto, discussed with patient to check blood pressure at home.    Pulm: Saturating well on RA this AM. CXR with improved pulmonary edema and bibasilar atelectasis. Encourage IS and ambulation.    GI: +BM. Tolerating a diet but does not like the food here so not eating very well.    Endo: T2DM, preop A1C 6.5. CBGs controlled on Metformin , repaglinide , semglee  25U daily, novolog  4U TID and SSI. Will restart home glyxambi  and semaglutide  at discharge. Some hypoglycemia overnight resolved with a carbohydrate snack.    Renal: Cr 0.59, stable. K 3.3, supplement. UO 1850cc/24hrs recorded. +7lbs from preop weight. Will continue PO Lasix at discharge.   DVT Prophylaxis: Lovenox    Hidradenitis: Chronic,  general surgery has sent in clindamycin  gel for her to use as an outpatient. Continue 1 weeks worth of PO doxycycline . She will follow up with PCP as needed   Dispo: Plan to d/c home today   LOS: 4 days    Con GORMAN Bend, PA-C 10/30/2024

## 2024-10-31 ENCOUNTER — Telehealth: Payer: Self-pay | Admitting: *Deleted

## 2024-10-31 NOTE — Transitions of Care (Post Inpatient/ED Visit) (Signed)
 10/31/2024  Name: [name] [name] MRN: [id number] DOB: Oct 29, 1966  Today's TOC FU Call Status: Today's TOC FU Call Status:: Successful TOC FU Call Completed TOC FU Call Complete Date: 10/31/24 Patient's Name and Date of Birth confirmed.  Transition Care Management Follow-up Telephone Call Date of Discharge: 10/30/24 Type of Discharge: Inpatient Admission Primary Inpatient Discharge Diagnosis:: S/P CABG x 3 How have you been since you were released from the hospital?: Better Any questions or concerns?: No  Items Reviewed: Did you receive and understand the discharge instructions provided?: Yes Medications obtained,verified, and reconciled?: Yes (Medications Reviewed) Any new allergies since your discharge?: No Dietary orders reviewed?: Yes Type of Diet Ordered:: Heart Healthy Do you have support at home?: Yes People in Home [RPT]: spouse Name of Support/Comfort Primary Source: [name] [name]  Medications Reviewed Today: Medications Reviewed Today     Reviewed by Kennieth Cathlean DEL, RN (Case Manager) on 10/31/24 at 1525  Med List Status: <None>   Medication Order Taking? Sig Documenting Provider Last Dose Status Informant  acetaminophen  (TYLENOL ) 325 MG tablet [id number] Yes Take 2 tablets (650 mg total) by mouth every 6 (six) hours as needed for mild pain (pain score 1-3). Raguel Con GORMAN, PA-C  Active   Albuterol -Budesonide (AIRSUPRA ) 90-80 MCG/ACT AERO [id number] Yes Inhale 2 Inhalations into the lungs 4 (four) times daily as needed. [name], Aleksei V, MD  Active Self  ALPRAZolam  (XANAX ) 0.5 MG tablet [id number] Yes TAKE 1 TABLET(0.5 MG) BY MOUTH TWICE DAILY  Patient taking differently: Take 0.5 mg by mouth 2 (two) times daily as needed for anxiety.   [name], Aleksei V, MD  Active Self  amLODipine (NORVASC) 5 MG tablet [id number] Yes Take 1 tablet (5 mg total) by mouth daily. Raguel Con GORMAN DEVONNA  Active   aspirin  EC 325 MG tablet [id number] Yes Take 1 tablet (325 mg total)  by mouth daily. Raguel Con S, PA-C  Active   atorvastatin  (LIPITOR) 80 MG tablet [id number] Yes Take 1 tablet (80 mg total) by mouth daily. Arrien, Mauricio Daniel, MD  Active Self  buPROPion  (WELLBUTRIN  SR) 150 MG 12 hr tablet [id number] Yes TAKE 1 TABLET BY MOUTH TWICE DAILY [name], Aleksei V, MD  Active Self  Cholecalciferol  (EQL VITAMIN D3) 1000 units tablet [id number] Yes Take 1 tablet (1,000 Units total) by mouth daily. [name], Aleksei V, MD  Active Self  citalopram  (CELEXA ) 40 MG tablet [id number] Yes Take 1 tablet (40 mg total) by mouth daily. [name], Aleksei V, MD  Active Self  clindamycin  (CLINDAGEL) 1 % gel [id number] Yes Apply to affected area 2 times daily [name] [name], [place]  Active   [facility] Cardiac Surgery, Patient & Family Education [id number]   [name] Elspeth BROCKS, MD  Active   doxycycline  (VIBRA -TABS) 100 MG tablet [id number] Yes Take 1 tablet (100 mg total) by mouth every 12 (twelve) hours. Raguel Con S, PA-C  Active   furosemide (LASIX) 40 MG tablet [id number] Yes Take 1.5 tablets (60 mg total) by mouth daily. Rolan Ezra GORMAN, MD  Active Self  gabapentin  (NEURONTIN ) 100 MG capsule [id number] Yes TAKE 1 CAPSULE(100 MG) BY MOUTH THREE TIMES DAILY [name], Aleksei V, MD  Active Self  GLYXAMBI  25-5 MG TABS [id number] Yes TAKE 1 TABLET BY MOUTH DAILY [name], Aleksei V, MD  Active Self  metFORMIN  (GLUCOPHAGE ) 500 MG tablet [id number] Yes TAKE 2 TABLETS TWICE DAILY WITH FOOD [name], Aleksei V, MD  Active Self  metoprolol  tartrate (LOPRESSOR ) 25 MG tablet [id number] Yes Take 0.5 tablets (12.5 mg total)  by mouth 2 (two) times daily. Raguel Con RAMAN, [place]  Active   oxyCODONE  (OXY IR/ROXICODONE ) 5 MG immediate release tablet [id number] Yes Take 1 tablet (5 mg total) by mouth every 6 (six) hours as needed for severe pain (pain score 7-10). Raguel Con S, [place]  Active   pantoprazole  (PROTONIX ) 40 MG tablet [id number] Yes TAKE 1 TABLET(40 MG) BY MOUTH TWICE DAILY  [name], Aleksei V, MD  Active Self  potassium chloride  (KLOR-CON ) 8 MEQ tablet [id number] Yes Take 1 tablet (8 mEq total) by mouth 4 (four) times daily. Rolan Ezra RAMAN, MD  Active Self  repaglinide  (PRANDIN ) 2 MG tablet [id number] Yes TAKE 1 TABLET BY MOUTH THREE TIMES DAILY BEFORE MEALS [name], Aleksei V, MD  Active Self  sacubitril-valsartan (ENTRESTO) 24-26 MG [id number] Yes Take 1 tablet by mouth 2 (two) times daily. Raguel Con RAMAN, PA-C  Active   Semaglutide , 2 MG/DOSE, 8 MG/3ML NELMA [id number] Yes 2 mg sq weekly [name], Aleksei V, MD  Active Self           Med Note (WHITE, DONETA RAMAN Kitchens Sep 19, 2024 11:35 PM) Haven't started the 2mg  dosing yet  spironolactone  (ALDACTONE ) 25 MG tablet [id number] Yes Take 1 tablet (25 mg total) by mouth daily. [name] Manuelita Garre, PA-C  Active Self            Home Care and Equipment/Supplies: Were Home Health Services Ordered?: NA Any new equipment or medical supplies ordered?: NA  Functional Questionnaire: Do you need assistance with bathing/showering or dressing?: Yes Do you need assistance with meal preparation?: Yes Do you need assistance with eating?: No Do you have difficulty maintaining continence: No Do you need assistance with getting out of bed/getting out of a chair/moving?: No Do you have difficulty managing or taking your medications?: No  Follow up appointments reviewed: PCP Follow-up appointment confirmed?: No MD Provider Line Number:[phone] Given: Yes (patient will make appt) Specialist Hospital Follow-up appointment confirmed?: Yes Date of Specialist follow-up appointment?: 11/07/24 Follow-Up Specialty Provider:: [id number] Manuelita HERO [facility] /[id number] 1:40 Do you need transportation to your follow-up appointment?: No Do you understand care options if your condition(s) worsen?: Yes-patient verbalized understanding  SDOH Interventions Today    Flowsheet Row Most Recent Value  SDOH Interventions   Food  Insecurity Interventions Intervention Not Indicated  Housing Interventions Intervention Not Indicated  Transportation Interventions Intervention Not Indicated, Patient Resources (Friends/Family)  Utilities Interventions Intervention Not Indicated   Discussed and offered 30 day TOC program.  Patient  declined.  The patient has been provided with contact information for the care management team and has been advised to call with any health -related questions or concerns.  The patient verbalized understanding with current plan of care.  The patient is directed to their insurance card regarding availability of benefits coverage   Cathlean Headland BSN RN [facility] Health Care Management Coordinator [email or web address] Direct Dial: [phone]  Fax: [phone] Website: [email or web address]

## 2024-11-01 ENCOUNTER — Telehealth (HOSPITAL_COMMUNITY): Payer: Self-pay

## 2024-11-01 DIAGNOSIS — Z951 Presence of aortocoronary bypass graft: Secondary | ICD-10-CM | POA: Diagnosis not present

## 2024-11-01 DIAGNOSIS — Z48812 Encounter for surgical aftercare following surgery on the circulatory system: Secondary | ICD-10-CM | POA: Diagnosis not present

## 2024-11-01 NOTE — Telephone Encounter (Signed)
 Called [facility] to see if she was interested in Cardiac Rehab.  Pt stated that she does not feel that she needs the program right now.  She said that she would be able to make a final decision once she has her F/U visit. Advised that she had up to a year to make decision.

## 2024-11-04 ENCOUNTER — Other Ambulatory Visit: Payer: Self-pay | Admitting: Thoracic Surgery (Cardiothoracic Vascular Surgery)

## 2024-11-04 DIAGNOSIS — I2510 Atherosclerotic heart disease of native coronary artery without angina pectoris: Secondary | ICD-10-CM

## 2024-11-07 ENCOUNTER — Ambulatory Visit (HOSPITAL_COMMUNITY)
Admission: RE | Admit: 2024-11-07 | Discharge: 2024-11-07 | Disposition: A | Discharge status: Home / Self Care | Source: Ambulatory Visit | Attending: Cardiology | Admitting: Cardiology

## 2024-11-07 ENCOUNTER — Ambulatory Visit: Payer: Self-pay

## 2024-11-07 VITALS — BP 113/71 | HR 95 | Resp 20 | Ht 68.0 in | Wt 186.0 lb

## 2024-11-07 DIAGNOSIS — Z951 Presence of aortocoronary bypass graft: Secondary | ICD-10-CM

## 2024-11-07 DIAGNOSIS — I2583 Coronary atherosclerosis due to lipid rich plaque: Secondary | ICD-10-CM | POA: Insufficient documentation

## 2024-11-07 DIAGNOSIS — I2510 Atherosclerotic heart disease of native coronary artery without angina pectoris: Secondary | ICD-10-CM | POA: Insufficient documentation

## 2024-11-07 MED ORDER — CYCLOBENZAPRINE HCL 10 MG PO TABS: 10.0000 mg | ORAL_TABLET | Freq: Three times a day (TID) | ORAL | 0 refills | Status: AC | PRN

## 2024-11-07 NOTE — Patient Instructions (Signed)
-  Follow up in 3 weeks with a chest xray  You may continue to gradually increase your physical activity as tolerated.  Refrain from any heavy lifting or strenuous use of your arms and shoulders until at least 6 weeks from the time of your surgery, and avoid activities that cause increased pain in your chest on the side of your surgical incision.  Otherwise you may continue to increase activities without any particular limitations.  Increase the intensity and duration of physical activity gradually.

## 2024-11-07 NOTE — Progress Notes (Signed)
 [address] [place] [address]             [phone]       HPI:  Patient returns for routine postoperative follow-up having undergone coronary artery bypass grafting x3 (left internal mammary artery to LAD, left radial artery to first diagonal, saphenous vein graft to posterior descending), endoscopic saphenous vein harvest from right thigh, and open left radial artery harvest on 10/26/2024 with Dr. Kerrin.  The patient's early postoperative recovery while in the hospital was notable for starting Norvasc for radial artery conduit. She has chronic hidradenitis and 7 day course of doxycyline was started as well as topical clindamycin  to use PRN. She was restarted on beta blocker, spironolactone  and Entresto. She was stable for discharge home on 10/30/2024.  Since hospital discharge the patient reports that she is doing well.  She has been need to take oxycodone  around once a day for pain.  She has been using tylenol  first.  She does have some lower back and pain at her shoulders.  This is improved with oxycodone . She has been very active with walking and doing stairs.  She does this around 3 times a day. Her incisions have been healing appropriately.  She denies chest pain, shortness of breath and lower leg edema.    Allergies as of 11/07/2024       Reactions   Adenosine  Shortness Of Breath   SOB and pressure for 20 seconds        Medication List        Accurate as of November 07, 2024 10:04 AM. If you have any questions, ask your nurse or doctor.          acetaminophen  325 MG tablet Commonly known as: Tylenol  Take 2 tablets (650 mg total) by mouth every 6 (six) hours as needed for mild pain (pain score 1-3).   Airsupra  90-80 MCG/ACT Aero Generic drug: Albuterol -Budesonide Inhale 2 Inhalations into the lungs 4 (four) times daily as needed.   ALPRAZolam  0.5 MG tablet Commonly known as: XANAX  TAKE 1 TABLET(0.5 MG) BY MOUTH TWICE  DAILY What changed:  how much to take how to take this when to take this reasons to take this additional instructions   amLODipine 5 MG tablet Commonly known as: NORVASC Take 1 tablet (5 mg total) by mouth daily.   aspirin  EC 325 MG tablet Take 1 tablet (325 mg total) by mouth daily.   atorvastatin  80 MG tablet Commonly known as: LIPITOR Take 1 tablet (80 mg total) by mouth daily.   buPROPion  150 MG 12 hr tablet Commonly known as: WELLBUTRIN  SR TAKE 1 TABLET BY MOUTH TWICE DAILY   Cholecalciferol  25 MCG (1000 UT) tablet Commonly known as: EQL Vitamin D3 Take 1 tablet (1,000 Units total) by mouth daily.   citalopram  40 MG tablet Commonly known as: CELEXA  Take 1 tablet (40 mg total) by mouth daily.   clindamycin  1 % gel Commonly known as: CLINDAGEL Apply to affected area 2 times daily   doxycycline  100 MG tablet Commonly known as: VIBRA -TABS Take 1 tablet (100 mg total) by mouth every 12 (twelve) hours.   furosemide 40 MG tablet Commonly known as: LASIX Take 1.5 tablets (60 mg total) by mouth daily.   gabapentin  100 MG capsule Commonly known as: NEURONTIN  TAKE 1 CAPSULE(100 MG) BY MOUTH THREE TIMES DAILY   Glyxambi  25-5 MG Tabs Generic drug: Empagliflozin -linaGLIPtin  TAKE 1 TABLET BY MOUTH DAILY  metFORMIN  500 MG tablet Commonly known as: GLUCOPHAGE  TAKE 2 TABLETS TWICE DAILY WITH FOOD   metoprolol  tartrate 25 MG tablet Commonly known as: LOPRESSOR  Take 0.5 tablets (12.5 mg total) by mouth 2 (two) times daily.   oxyCODONE  5 MG immediate release tablet Commonly known as: Oxy IR/ROXICODONE  Take 1 tablet (5 mg total) by mouth every 6 (six) hours as needed for severe pain (pain score 7-10).   pantoprazole  40 MG tablet Commonly known as: PROTONIX  TAKE 1 TABLET(40 MG) BY MOUTH TWICE DAILY   potassium chloride  8 MEQ tablet Commonly known as: KLOR-CON  Take 1 tablet (8 mEq total) by mouth 4 (four) times daily.   repaglinide  2 MG tablet Commonly known as:  PRANDIN  TAKE 1 TABLET BY MOUTH THREE TIMES DAILY BEFORE MEALS   sacubitril-valsartan 24-26 MG Commonly known as: ENTRESTO Take 1 tablet by mouth 2 (two) times daily.   Semaglutide  (2 MG/DOSE) 8 MG/3ML Sopn 2 mg sq weekly   spironolactone  25 MG tablet Commonly known as: ALDACTONE  Take 1 tablet (25 mg total) by mouth daily.         ROS  Review of Systems  Constitutional:  Negative for fever and malaise/fatigue.  Respiratory:  Negative for cough and shortness of breath.   Cardiovascular:  Negative for chest pain and leg swelling.  Musculoskeletal:  Positive for back pain.     BP 113/71   Pulse 95   Resp 20   Ht 5' 8 (1.727 m)   Wt 186 lb (84.4 kg)   LMP 03/02/2017   SpO2 96% Comment: RA  BMI 28.28 kg/m    Physical Exam Constitutional:      Appearance: Normal appearance.  HENT:     Head: Normocephalic and atraumatic.  Cardiovascular:     Rate and Rhythm: Normal rate and regular rhythm.     Heart sounds: Normal heart sounds, S1 normal and S2 normal.  Pulmonary:     Effort: Pulmonary effort is normal.     Breath sounds: Normal breath sounds.  Musculoskeletal:     Cervical back: Normal range of motion.  Skin:    General: Skin is warm and dry.      Neurological:     General: No focal deficit present.     Mental Status: She is alert.      Imaging: EXAM: PA AND LATERAL (2) VIEW(S) XRAY OF THE CHEST 11/07/2024 09:55:00 AM   COMPARISON: PA and lateral radiographs of the chest dated 10/29/2024.   CLINICAL HISTORY: Status post CABG.   FINDINGS:   LUNGS AND PLEURA: Mild hazy opacification of the posterior lung base seen on the lateral radiograph likely reflecting resolving atelectasis in the left lung base. No pleural effusion. No pneumothorax.   HEART AND MEDIASTINUM: No acute abnormality of the cardiac and mediastinal silhouettes.   BONES AND SOFT TISSUES: No acute osseous abnormality.   IMPRESSION: 1. Mild hazy opacification of the posterior  left lung base, likely reflecting resolving atelectasis.   Electronically signed by: Evalene Coho MD 11/07/2024 10:37 AM EST RP Workstation: [id number]   Assessment/Plan:  S/P CABG x 3 -We reviewed today's chest x ray. Left sided atelectasis at this time. She should use her incentive spirometer -Increase her activity/exercise as tolerated -Sent in flexeril  for patient at this time to help with low back pain. She can continue to use tylenol  and oxycodone  as prescribed -Discussed continuance of sternal precautions until a full 6 weeks from surgery.  Continue to not lift over 10 pounds -Keep incision sites  clean and dry. Continue to use clindamycin  cream as needed for underarm hidradenitis.  -She has follow up with cardiology on 11/16/2024  -Follow up with [facility] in 3 weeks with chest xray. Will discuss driving and participation in cardiac rehab  Manuelita CHRISTELLA Rough, [place] 10:04 AM 11/07/24

## 2024-11-09 ENCOUNTER — Other Ambulatory Visit (HOSPITAL_COMMUNITY): Payer: Self-pay

## 2024-11-09 ENCOUNTER — Other Ambulatory Visit: Payer: Self-pay | Admitting: Internal Medicine

## 2024-11-16 ENCOUNTER — Other Ambulatory Visit (HOSPITAL_COMMUNITY): Payer: Self-pay

## 2024-11-16 ENCOUNTER — Encounter (HOSPITAL_COMMUNITY): Payer: Self-pay | Admitting: Cardiology

## 2024-11-16 ENCOUNTER — Ambulatory Visit (HOSPITAL_COMMUNITY): Payer: Self-pay | Admitting: Cardiology

## 2024-11-16 ENCOUNTER — Encounter: Payer: Self-pay | Admitting: Oncology

## 2024-11-16 ENCOUNTER — Ambulatory Visit (HOSPITAL_COMMUNITY)
Admission: RE | Admit: 2024-11-16 | Discharge: 2024-11-16 | Disposition: A | Discharge status: Home / Self Care | Source: Ambulatory Visit | Attending: Cardiology | Admitting: Cardiology

## 2024-11-16 VITALS — BP 140/88 | HR 111 | Ht 68.0 in | Wt 184.8 lb

## 2024-11-16 DIAGNOSIS — Z7984 Long term (current) use of oral hypoglycemic drugs: Secondary | ICD-10-CM | POA: Insufficient documentation

## 2024-11-16 DIAGNOSIS — I452 Bifascicular block: Secondary | ICD-10-CM | POA: Insufficient documentation

## 2024-11-16 DIAGNOSIS — Z7982 Long term (current) use of aspirin: Secondary | ICD-10-CM | POA: Insufficient documentation

## 2024-11-16 DIAGNOSIS — I2510 Atherosclerotic heart disease of native coronary artery without angina pectoris: Secondary | ICD-10-CM | POA: Insufficient documentation

## 2024-11-16 DIAGNOSIS — Z9889 Other specified postprocedural states: Secondary | ICD-10-CM | POA: Insufficient documentation

## 2024-11-16 DIAGNOSIS — K219 Gastro-esophageal reflux disease without esophagitis: Secondary | ICD-10-CM | POA: Insufficient documentation

## 2024-11-16 DIAGNOSIS — Z79899 Other long term (current) drug therapy: Secondary | ICD-10-CM | POA: Insufficient documentation

## 2024-11-16 DIAGNOSIS — Z951 Presence of aortocoronary bypass graft: Secondary | ICD-10-CM | POA: Insufficient documentation

## 2024-11-16 DIAGNOSIS — I255 Ischemic cardiomyopathy: Secondary | ICD-10-CM | POA: Insufficient documentation

## 2024-11-16 DIAGNOSIS — R000 Tachycardia, unspecified: Secondary | ICD-10-CM | POA: Diagnosis not present

## 2024-11-16 DIAGNOSIS — I5022 Chronic systolic (congestive) heart failure: Secondary | ICD-10-CM | POA: Insufficient documentation

## 2024-11-16 DIAGNOSIS — Z8249 Family history of ischemic heart disease and other diseases of the circulatory system: Secondary | ICD-10-CM | POA: Diagnosis not present

## 2024-11-16 DIAGNOSIS — I493 Ventricular premature depolarization: Secondary | ICD-10-CM | POA: Insufficient documentation

## 2024-11-16 DIAGNOSIS — E781 Pure hyperglyceridemia: Secondary | ICD-10-CM | POA: Diagnosis not present

## 2024-11-16 DIAGNOSIS — Z87891 Personal history of nicotine dependence: Secondary | ICD-10-CM | POA: Insufficient documentation

## 2024-11-16 DIAGNOSIS — Z7985 Long-term (current) use of injectable non-insulin antidiabetic drugs: Secondary | ICD-10-CM | POA: Diagnosis not present

## 2024-11-16 DIAGNOSIS — E119 Type 2 diabetes mellitus without complications: Secondary | ICD-10-CM | POA: Insufficient documentation

## 2024-11-16 LAB — BASIC METABOLIC PANEL WITH GFR
Anion gap: 12 (ref 5–15)
BUN: 7 mg/dL (ref 6–20)
CO2: 24 mmol/L (ref 22–32)
Calcium: 9.2 mg/dL (ref 8.9–10.3)
Chloride: 102 mmol/L (ref 98–111)
Creatinine, Ser: 0.62 mg/dL (ref 0.44–1.00)
GFR, Estimated: >60 mL/min (ref >60)
Glucose, Bld: 193 mg/dL — ABNORMAL HIGH (ref 70–99)
Potassium: 3.5 mmol/L (ref 3.5–5.1)
Sodium: 138 mmol/L (ref 135–145)

## 2024-11-16 LAB — BRAIN NATRIURETIC PEPTIDE: B Natriuretic Peptide: 374 pg/mL — ABNORMAL HIGH (ref 0.0–100.0)

## 2024-11-16 MED ORDER — SACUBITRIL-VALSARTAN 24-26 MG PO TABS: 1.0000 | ORAL_TABLET | Freq: Two times a day (BID) | ORAL | 3 refills | Status: AC

## 2024-11-16 MED ORDER — FUROSEMIDE 40 MG PO TABS: 40.0000 mg | ORAL_TABLET | Freq: Every day | ORAL | 6 refills | Status: DC

## 2024-11-16 MED ORDER — ATORVASTATIN CALCIUM 80 MG PO TABS
80.0000 mg | ORAL_TABLET | Freq: Every day | ORAL | 3 refills | Status: AC
Start: 2024-11-16 — End: ?

## 2024-11-16 MED ORDER — CARVEDILOL 6.25 MG PO TABS: 6.2500 mg | ORAL_TABLET | Freq: Two times a day (BID) | ORAL | 3 refills | Status: AC

## 2024-11-16 MED ORDER — ICOSAPENT ETHYL 1 G PO CAPS
2.0000 g | ORAL_CAPSULE | Freq: Two times a day (BID) | ORAL | 5 refills | Status: AC
Start: 2024-11-16 — End: ?

## 2024-11-16 NOTE — Patient Instructions (Signed)
 Medication Changes:  START ENTRESTO  24/26MG  TWICE DAILY   DECREASE LASIX  (FUROSEMIDE ) TO 40MG  ONCE DAILY AFTER YOU START THE ENTRESTO    STOP METOPROLOL    START CARVEDILOL  6.25MG  TWICE DAILY   START VASCEPA  2G TWICE DAILY   Lab Work:  Labs done today, your results will be available in MyChart, we will contact you for abnormal readings.  AND THEN LABS AGAIN IN 10 DAYS AS SCHEDULED   Follow-Up in: 3 WEEKS WITH APP AS SCHEDULED   THEN ECHOCARDIOGRAM IN 3 MONTHS   At the [facility], you and your health needs are our priority. We have a designated team specialized in the treatment of Heart Failure. This Care Team includes your primary Heart Failure Specialized Cardiologist (physician), Advanced Practice Providers (APPs- Physician Assistants and Nurse Practitioners), and Pharmacist who all work together to provide you with the care you need, when you need it.   You may see any of the following providers on your designated Care Team at your next follow up:  Dr. Toribio Fuel Dr. Ezra Shuck Dr. Odis Brownie Greig Mosses, NP Caffie Shed, [place] [facility] [place], [place] Beckey Coe, NP Jordan Lee, NP Tinnie Redman, PharmD   Please be sure to bring in all your medications bottles to every appointment.   Need to Contact Us :  If you have any questions or concerns before your next appointment please send us  a message through Oth[facility] or call our office at [phone].    TO LEAVE A MESSAGE FOR THE NURSE SELECT OPTION 2, PLEASE LEAVE A MESSAGE INCLUDING: YOUR NAME DATE OF BIRTH CALL BACK NUMBER REASON FOR CALL**this is important as we prioritize the call backs  YOU WILL RECEIVE A CALL BACK THE SAME DAY AS LONG AS YOU CALL BEFORE 4:00 PM

## 2024-11-20 NOTE — Progress Notes (Signed)
 [facility] Consult  PCP: [name], Karlynn GAILS, MD HF Cardiologist: Dr. Rolan  Chief complaint: CHF  HPI:  [name] [name] is a 58 y.o. female with history of atrial tachycardia status post ablation in July 2025, peripartum cardiomyopathy in 2005, hyperlipidemia, DM II, GERD, tobacco use, iron  deficiency anemia.  On review of old records note history of admission for CHF in June 2005 after delivery of her first child the month prior. EF 25-30% at that time. No CAD on cath. EF had recovered on subsequent echo in June 2006.  Later had admission with SVT in 5/25. Echo at that time with LVEF 40-45%. Had SVT ablation in July 2025. Underwent coronary CTA which showed flow-limiting disease involving LAD, RCA and LCX by CT-FFR.    She presented 09/19/24 with worsening shortness of breath, orthopnea, PND and episodes of chest pain.  She was admitted with acute on chronic CHF.  Cardiology was consulted.  Echo showed EF 25-30%, WMA, RV okay, moderate pericardial effusion. [facility] 09/21/24 showed severe 2V CAD, LVEDP 33, RA mean 11, PA 55/30 (39), PCWP mean 29, Fick CO/CI 6.12/3.02. She was diuresed and GDMT titrated. Seen by [facility] with plan for CABG once optimized. Referred to heart failure clinic.   She had CABG in 11/25 with LIMA-LAD, SVG-PDA, and radial-D1, uneventful post-op course.   Today she presents for followup of CHF. She is doing well after the procedure.  Dyspnea walking up a flight of stairs but no dyspnea walking on flat ground.  Has some chest soreness at sternotomy site but no ischemic-type chest pain.  Rarely gets some lightheadedness if she stands too fast.  Weight down 6 lbs.  BP upper normal.  She is a prior smoking, has now quit. Works as systems developer at [employer], I also took care of her father who had CAD and CHF as well.  ECG (personally reviewed): sinus tachy rate 110, RBBB, LAFB  Labs (9/25): LDL 51, TGs 246 Labs (10/25): K 3.8, creatinine  0.72 Labs (11/25): K 3.3, creatinine 0.59  PMH: 1. Type 2 diabetes 2. Chronic systolic CHF: Peripartum cardiomyopathy initially, now ischemic cardiomyopathy.  - Peripartum cardiomyopathy with echo in 6/25 showing EF 25-30%.  Repeat echo in 6/06 with normal systolic function.  - Echo (5/25) with EF down to 40-45%.  - Echo (9/25) with EF 25-30%, WMA, RV okay, moderate pericardial effusion. - LHC/RHC (9/25): 80% pLAD, 90% dLAD, 80% large D1, 99% pRCA; LVEDP 33, RA mean 11, PA 55/30 (39), PCWP mean 29, Fick CO/CI 6.12/3.02. - CABG 11/25 3. Atrial tachycardia s/p ablation in 7/25 by Dr. Waddell 4. CAD: LHC (9/25) showed 80% pLAD, 90% dLAD, 80% large D1, 99% pRCA.  - CABG (11/25) with LIMA-LAD, SVG-PDA, and radial-D1 5. GERD 6. CMV hepatitis   Current Outpatient Medications  Medication Sig Dispense Refill   acetaminophen  (TYLENOL ) 325 MG tablet Take 2 tablets (650 mg total) by mouth every 6 (six) hours as needed for mild pain (pain score 1-3).     Albuterol -Budesonide  (AIRSUPRA ) 90-80 MCG/ACT AERO Inhale 2 Inhalations into the lungs 4 (four) times daily as needed. 10 g 5   ALPRAZolam  (XANAX ) 0.5 MG tablet TAKE 1 TABLET(0.5 MG) BY MOUTH TWICE DAILY (Patient taking differently: Take 0.5 mg by mouth 2 (two) times daily as needed for anxiety.) 180 tablet 0   amLODipine  (NORVASC ) 5 MG tablet Take 1 tablet (5 mg total) by mouth daily. 60 tablet 1   aspirin  EC 325 MG  tablet Take 1 tablet (325 mg total) by mouth daily.     buPROPion  (WELLBUTRIN  SR) 150 MG 12 hr tablet TAKE 1 TABLET BY MOUTH TWICE DAILY 180 tablet 1   carvedilol  (COREG ) 6.25 MG tablet Take 1 tablet (6.25 mg total) by mouth 2 (two) times daily. 180 tablet 3   Cholecalciferol  (EQL VITAMIN D3) 1000 units tablet Take 1 tablet (1,000 Units total) by mouth daily. 90 tablet 1   citalopram  (CELEXA ) 40 MG tablet Take 1 tablet (40 mg total) by mouth daily. 90 tablet 3   clindamycin  (CLINDAGEL) 1 % gel Apply to affected area 2 times daily 30 g 0    doxycycline  (VIBRA -TABS) 100 MG tablet Take 1 tablet (100 mg total) by mouth every 12 (twelve) hours. 6 tablet 0   gabapentin  (NEURONTIN ) 100 MG capsule TAKE 1 CAPSULE(100 MG) BY MOUTH THREE TIMES DAILY 90 capsule 5   GLYXAMBI  25-5 MG TABS TAKE 1 TABLET BY MOUTH DAILY 90 tablet 3   icosapent  Ethyl (VASCEPA ) 1 g capsule Take 2 capsules (2 g total) by mouth 2 (two) times daily. 120 capsule 5   metFORMIN  (GLUCOPHAGE ) 500 MG tablet TAKE 2 TABLETS TWICE DAILY WITH FOOD 360 tablet 0   oxyCODONE  (OXY IR/ROXICODONE ) 5 MG immediate release tablet Take 1 tablet (5 mg total) by mouth every 6 (six) hours as needed for severe pain (pain score 7-10). 28 tablet 0   pantoprazole  (PROTONIX ) 40 MG tablet TAKE 1 TABLET(40 MG) BY MOUTH TWICE DAILY 180 tablet 3   potassium chloride  (KLOR-CON ) 8 MEQ tablet Take 1 tablet (8 mEq total) by mouth 4 (four) times daily. 90 tablet 5   repaglinide  (PRANDIN ) 2 MG tablet TAKE 1 TABLET BY MOUTH THREE TIMES DAILY BEFORE MEALS 270 tablet 3   Semaglutide , 2 MG/DOSE, 8 MG/3ML SOPN 2 mg sq weekly 3 mL 5   spironolactone  (ALDACTONE ) 25 MG tablet Take 1 tablet (25 mg total) by mouth daily. 90 tablet 3   atorvastatin  (LIPITOR ) 80 MG tablet Take 1 tablet (80 mg total) by mouth daily. 90 tablet 3   furosemide  (LASIX ) 40 MG tablet Take 1 tablet (40 mg total) by mouth daily. 30 tablet 6   sacubitril -valsartan  (ENTRESTO ) 24-26 MG Take 1 tablet by mouth 2 (two) times daily. 60 tablet 3   No current facility-administered medications for this encounter.   Facility-Administered Medications Ordered in Other Encounters  Medication Dose Route Frequency Provider Last Rate Last Admin   [facility] Education   Does not apply Once Kerrin Elspeth BROCKS, MD       Allergies  Allergen Reactions   Adenosine  Shortness Of Breath    SOB and pressure for 20 seconds   Social History   Socioeconomic History   Marital status: Married    Spouse name: Lyndy   Number  of children: 2   Years of education: Not on file   Highest education level: Bachelor's degree (e.g., BA, AB, BS)  Occupational History   Occupation: EC teacher    Employer: [employer]  Tobacco Use   Smoking status: Former    Current packs/day: 0.50    Average packs/day: 0.5 packs/day for 15.0 years (7.5 ttl pk-yrs)    Types: Cigarettes   Smokeless tobacco: Never   Tobacco comments:    info given 12/14/2019  Vaping Use   Vaping status: Never Used  Substance and Sexual Activity   Alcohol use: Not Currently   Drug use: No   Sexual activity: Not  Currently    Birth control/protection: Post-menopausal  Other Topics Concern   Not on file  Social History Narrative   Not on file   Social Drivers of Health   Financial Resource Strain: Low Risk  (09/21/2024)   Overall Financial Resource Strain (CARDIA)    Difficulty of Paying Living Expenses: Not very hard  Food Insecurity: No Food Insecurity (10/31/2024)   Hunger Vital Sign    Worried About Running Out of Food in the Last Year: Never true    Ran Out of Food in the Last Year: Never true  Transportation Needs: No Transportation Needs (10/31/2024)   PRAPARE - Administrator, Civil Service (Medical): No    Lack of Transportation (Non-Medical): No  Physical Activity: Inactive (07/03/2024)   Exercise Vital Sign    Days of Exercise per Week: 0 days    Minutes of Exercise per Session: Not on file  Stress: No Stress Concern Present (07/03/2024)   [employer] of Occupational Health - Occupational Stress Questionnaire    Feeling of Stress: Only a little  Social Connections: Socially Integrated (07/03/2024)   Social Connection and Isolation Panel    Frequency of Communication with Friends and Family: More than three times a week    Frequency of Social Gatherings with Friends and Family: Once a week    Attends Religious Services: 1 to 4 times per year    Active Member of [employer] or Organizations: Yes    Attends Tax Inspector Meetings: 1 to 4 times per year    Marital Status: Married  Catering Manager Violence: Not At Risk (10/31/2024)   Humiliation, Afraid, Rape, and Kick questionnaire    Fear of Current or Ex-Partner: No    Emotionally Abused: No    Physically Abused: No    Sexually Abused: No   Family History  Problem Relation Age of Onset   Diabetes Mother    Hypertension Mother    Anxiety disorder Mother    Depression Mother    Hearing loss Mother    Miscarriages / Stillbirths Mother    Obesity Mother    Varicose Veins Mother    COPD Father    Depression Father    Hearing loss Father    Heart disease Father    Obesity Father    Inflammatory bowel disease Maternal Aunt    Colon cancer Paternal Aunt    Colon polyps Maternal Grandmother    Arthritis Maternal Grandmother    Cancer Maternal Grandmother    Hypertension Maternal Grandmother    Obesity Maternal Grandmother    Hypertension Other    Anxiety disorder Maternal Aunt    Cancer Maternal Aunt    Diabetes Maternal Aunt    Liver cancer Neg Hx    Pancreatic cancer Neg Hx    Rectal cancer Neg Hx    Stomach cancer Neg Hx    Esophageal cancer Neg Hx    ROS: All systems reviewed and negative except as per HPI.   Wt Readings from Last 3 Encounters:  11/16/24 83.8 kg (184 lb 12.8 oz)  11/07/24 84.4 kg (186 lb)  10/30/24 86.8 kg (191 lb 6.4 oz)   BP (!) 140/88   Pulse (!) 111   Ht 5' 8 (1.727 m)   Wt 83.8 kg (184 lb 12.8 oz)   LMP 03/02/2017   SpO2 97%   BMI 28.10 kg/m   PHYSICAL EXAM: General: NAD Neck: No JVD, no thyromegaly or thyroid  nodule.  Lungs: Clear to auscultation  bilaterally with normal respiratory effort. CV: Nondisplaced PMI.  Heart regular S1/S2, no S3/S4, 2/6 early SEM RUSB.  No peripheral edema.  No carotid bruit.  Normal pedal pulses.  Abdomen: Soft, nontender, no hepatosplenomegaly, no distention.  Skin: Intact without lesions or rashes.  Neurologic: Alert and oriented x 3.  Psych: Normal  affect. Extremities: No clubbing or cyanosis.  HEENT: Normal.   ASSESSMENT & PLAN: 1. Chronic systolic CHF: She has a history of peripartum cardiomyopathy in 2005, EF down to 25-30% w/ later recovery in EF by 2006. No CAD on cath at that time. Now with ischemic cardiomyopathy. Echo in 9/25 showed LVEF 25-30%, WMA, RV okay, moderate pericardial effusion. Cath at that time showed severe disease in LAD, D1, RCA.  She had CABG x 3 in 11/25. Today, she has NYHA class II symptoms and is not volume overloaded on exam.  - Start Entresto  24/26 bid today. BMET/BNP today, BMET in 10 days.  - With initiation of Entresto , can decrease Lasix  to 40 mg daily.  - Stop metoprolol , start Coreg  6.25 mg bid.  - Continue spironolactone  25 mg daily. - Continue Jardiance  25 mg daily. - Will titrate GDMT and arrange for echo in 3 months post-CABG to decide on ICD.  RBBB, so will not be CRT candidate.  2. CAD: Cath 10/25 with severe disease in LAD, D1, and RCA.  Now s/p CABG with LIMA-LAD, SVG-PDA, radial-D1.  - Continue aspirin  + atorvastatin  80 - Refer for cardiac rehab 3. DM2: Per PCP.  4. SVT: Right atrial tachycardia, s/p ablation in 7/25.  - Continue beta blocker.  5. Hyperlipidemia: LDL at goal on statin (<55%).  - With elevated triglycerides and CAD, will start Vascepa  2 g bid.  Check lipids 2 months.   Followup 3 wks with APP for med titration.   I spent 31 minutes reviewing records, interviewing/examining patient, and managing orders.   Ezra Shuck 11/20/24

## 2024-11-22 ENCOUNTER — Other Ambulatory Visit (HOSPITAL_COMMUNITY): Payer: Self-pay

## 2024-11-23 ENCOUNTER — Inpatient Hospital Stay: Payer: BC Managed Care – PPO | Attending: Physician Assistant

## 2024-11-25 ENCOUNTER — Other Ambulatory Visit: Payer: Self-pay | Admitting: Thoracic Surgery (Cardiothoracic Vascular Surgery)

## 2024-11-25 DIAGNOSIS — Z951 Presence of aortocoronary bypass graft: Secondary | ICD-10-CM

## 2024-11-25 NOTE — Progress Notes (Signed)
 cx

## 2024-11-28 ENCOUNTER — Ambulatory Visit (HOSPITAL_COMMUNITY)

## 2024-11-28 ENCOUNTER — Other Ambulatory Visit (HOSPITAL_COMMUNITY): Payer: Self-pay

## 2024-11-28 ENCOUNTER — Ambulatory Visit: Payer: Self-pay

## 2024-11-29 ENCOUNTER — Inpatient Hospital Stay

## 2024-12-01 ENCOUNTER — Ambulatory Visit: Payer: Self-pay

## 2024-12-01 ENCOUNTER — Ambulatory Visit (HOSPITAL_COMMUNITY)
Admission: RE | Admit: 2024-12-01 | Discharge: 2024-12-01 | Attending: Thoracic Surgery (Cardiothoracic Vascular Surgery)

## 2024-12-01 ENCOUNTER — Inpatient Hospital Stay (HOSPITAL_COMMUNITY)
Admission: RE | Admit: 2024-12-01 | Discharge: 2024-12-01 | Attending: Thoracic Surgery (Cardiothoracic Vascular Surgery) | Admitting: Thoracic Surgery (Cardiothoracic Vascular Surgery)

## 2024-12-01 ENCOUNTER — Other Ambulatory Visit: Payer: Self-pay | Admitting: Internal Medicine

## 2024-12-01 ENCOUNTER — Other Ambulatory Visit (INDEPENDENT_AMBULATORY_CARE_PROVIDER_SITE_OTHER)

## 2024-12-01 ENCOUNTER — Encounter (HOSPITAL_COMMUNITY): Payer: Self-pay

## 2024-12-01 VITALS — BP 97/65 | HR 100 | Resp 20 | Ht 68.0 in | Wt 179.0 lb

## 2024-12-01 DIAGNOSIS — E119 Type 2 diabetes mellitus without complications: Secondary | ICD-10-CM | POA: Diagnosis not present

## 2024-12-01 DIAGNOSIS — I2510 Atherosclerotic heart disease of native coronary artery without angina pectoris: Secondary | ICD-10-CM

## 2024-12-01 DIAGNOSIS — Z951 Presence of aortocoronary bypass graft: Secondary | ICD-10-CM

## 2024-12-01 DIAGNOSIS — I5022 Chronic systolic (congestive) heart failure: Secondary | ICD-10-CM | POA: Diagnosis not present

## 2024-12-01 DIAGNOSIS — E669 Obesity, unspecified: Secondary | ICD-10-CM

## 2024-12-01 DIAGNOSIS — I2583 Coronary atherosclerosis due to lipid rich plaque: Secondary | ICD-10-CM

## 2024-12-01 LAB — BASIC METABOLIC PANEL WITH GFR
Anion gap: 12 (ref 5–15)
BUN: 13 mg/dL (ref 6–20)
CO2: 25 mmol/L (ref 22–32)
Calcium: 9.5 mg/dL (ref 8.9–10.3)
Chloride: 98 mmol/L (ref 98–111)
Creatinine, Ser: 0.84 mg/dL (ref 0.44–1.00)
GFR, Estimated: >60 mL/min (ref >60)
Glucose, Bld: 162 mg/dL — ABNORMAL HIGH (ref 70–99)
Potassium: 4.8 mmol/L (ref 3.5–5.1)
Sodium: 135 mmol/L (ref 135–145)

## 2024-12-01 LAB — HEMOGLOBIN A1C: Hgb A1c MFr Bld: 6.6 % — ABNORMAL HIGH (ref 4.6–6.5)

## 2024-12-01 MED ORDER — CLINDAMYCIN PHOS (TWICE-DAILY) 1 % EX GEL: Freq: Two times a day (BID) | CUTANEOUS | 0 refills | Status: AC

## 2024-12-01 NOTE — Patient Instructions (Signed)

## 2024-12-01 NOTE — Progress Notes (Signed)
 Patient rescheduled.

## 2024-12-01 NOTE — Progress Notes (Signed)
 [address] [place] [address]             [phone]       HPI:  Patient returns for routine postoperative follow-up having undergone coronary artery bypass grafting x3 (left internal mammary artery to LAD, left radial artery to first diagonal, saphenous vein graft to posterior descending), endoscopic saphenous vein harvest from right thigh, and open left radial artery harvest on 10/26/2024 with Dr. Kerrin.  The patient's early postoperative recovery while in the hospital was notable for starting Norvasc  for radial artery conduit. She has chronic hidradenitis and 7 day course of doxycyline was started as well as topical clindamycin  to use PRN. She was restarted on beta blocker, spironolactone  and Entresto . She was stable for discharge home on 10/30/2024.  She presents to the clinic today for continued follow up.  She reports that she has been doing very well.  She has minimal pain, usually only with sneezing or bending down to quickly.  She has been walking and doing household chores for activity. Her incision sites have been healing well.  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  She denies chest pain, shortness of breath and lower leg swelling.     Allergies as of 12/01/2024       Reactions   Adenosine  Shortness Of Breath   SOB and pressure for 20 seconds        Medication List        Accurate as of December 01, 2024  9:10 AM. If you have any questions, ask your nurse or doctor.          acetaminophen  325 MG tablet Commonly known as: Tylenol  Take 2 tablets (650 mg total) by mouth every 6 (six) hours as needed for mild pain (pain score 1-3).   Airsupra  90-80 MCG/ACT Aero Generic drug: Albuterol -Budesonide  Inhale 2 Inhalations into the lungs 4 (four) times daily as needed.   ALPRAZolam  0.5 MG tablet Commonly known as: XANAX  TAKE 1 TABLET(0.5 MG) BY MOUTH TWICE DAILY What changed:  how much to take how to  take this when to take this reasons to take this additional instructions   amLODipine  5 MG tablet Commonly known as: NORVASC  Take 1 tablet (5 mg total) by mouth daily.   aspirin  EC 325 MG tablet Take 1 tablet (325 mg total) by mouth daily.   atorvastatin  80 MG tablet Commonly known as: LIPITOR  Take 1 tablet (80 mg total) by mouth daily.   buPROPion  150 MG 12 hr tablet Commonly known as: WELLBUTRIN  SR TAKE 1 TABLET BY MOUTH TWICE DAILY   carvedilol  6.25 MG tablet Commonly known as: COREG  Take 1 tablet (6.25 mg total) by mouth 2 (two) times daily.   Cholecalciferol  25 MCG (1000 UT) tablet Commonly known as: EQL Vitamin D3 Take 1 tablet (1,000 Units total) by mouth daily.   citalopram  40 MG tablet Commonly known as: CELEXA  Take 1 tablet (40 mg total) by mouth daily.   clindamycin  1 % gel Commonly known as: CLINDAGEL Apply to affected area 2 times daily   doxycycline  100 MG tablet Commonly known as: VIBRA -TABS Take 1 tablet (100 mg total) by mouth every 12 (twelve) hours.   furosemide  40 MG tablet Commonly known as: LASIX  Take 1 tablet (40 mg total) by mouth daily.   gabapentin  100 MG capsule Commonly known as: NEURONTIN  TAKE 1 CAPSULE(100 MG) BY MOUTH THREE TIMES DAILY  Glyxambi  25-5 MG Tabs Generic drug: Empagliflozin -linaGLIPtin  TAKE 1 TABLET BY MOUTH DAILY   icosapent  Ethyl 1 g capsule Commonly known as: Vascepa  Take 2 capsules (2 g total) by mouth 2 (two) times daily.   metFORMIN  500 MG tablet Commonly known as: GLUCOPHAGE  TAKE 2 TABLETS TWICE DAILY WITH FOOD   oxyCODONE  5 MG immediate release tablet Commonly known as: Oxy IR/ROXICODONE  Take 1 tablet (5 mg total) by mouth every 6 (six) hours as needed for severe pain (pain score 7-10).   pantoprazole  40 MG tablet Commonly known as: PROTONIX  TAKE 1 TABLET(40 MG) BY MOUTH TWICE DAILY   potassium chloride  8 MEQ tablet Commonly known as: KLOR-CON  Take 1 tablet (8 mEq total) by mouth 4 (four) times  daily.   repaglinide  2 MG tablet Commonly known as: PRANDIN  TAKE 1 TABLET BY MOUTH THREE TIMES DAILY BEFORE MEALS   sacubitril -valsartan  24-26 MG Commonly known as: ENTRESTO  Take 1 tablet by mouth 2 (two) times daily.   Semaglutide  (2 MG/DOSE) 8 MG/3ML Sopn 2 mg sq weekly   spironolactone  25 MG tablet Commonly known as: ALDACTONE  Take 1 tablet (25 mg total) by mouth daily.         ROS  Review of Systems  Constitutional: Negative.  Negative for malaise/fatigue.  Respiratory:  Negative for cough and shortness of breath.   Cardiovascular: Negative.  Negative for chest pain, palpitations and leg swelling.     BP 97/65   Pulse (!) 110   Resp 20   Ht 5' 8 (1.727 m)   Wt 179 lb (81.2 kg)   LMP 03/02/2017   SpO2 98% Comment: RA  BMI 27.22 kg/m    Physical Exam Constitutional:      Appearance: Normal appearance.  HENT:     Head: Normocephalic and atraumatic.  Cardiovascular:     Rate and Rhythm: Normal rate and regular rhythm.     Heart sounds: Normal heart sounds, S1 normal and S2 normal.  Pulmonary:     Effort: Pulmonary effort is normal.     Breath sounds: Normal breath sounds.  Musculoskeletal:     Cervical back: Normal range of motion.  Skin:    General: Skin is warm and dry.      Neurological:     General: No focal deficit present.     Mental Status: She is alert.      Imaging: CLINICAL DATA:  Status post CABG.   EXAM: CHEST - 2 VIEW   COMPARISON:  11/07/2024   FINDINGS: The lungs are clear without focal pneumonia, edema, pneumothorax or pleural effusion. The cardiopericardial silhouette is within normal limits for size. No acute bony abnormality. Median sternotomy wires evident.   IMPRESSION: No active cardiopulmonary disease.     Electronically Signed   By: Camellia Candle M.D.   On: 12/01/2024 09:21   Assessment/Plan:  S/P CABG x 3 -We reviewed today's chest x ray. Which showed resolution of left sided atelectasis -We discussed  driving and she is able to start. First time driving should be a short distance in the day time and she can increase from there -She is cleared from a surgical standpoint to participate in cardiac rehab -Incision sites are healing appropriately. Continue to keep them clean with soap and water , pat dry -Discussed continuation of sternal precautions until a full 6 weeks from surgery, no lifting over 10 pounds until a full 3 months from surgery -Note work provided for her to return on 12/26/2024. -She was seen by cardiology and she  had some adjustments made to her medications.  She started Entresto  and carvedilol . She is to continue with Jardiance , aspirin  and atorvastatin . -Follow up with [facility] as needed   Manuelita CHRISTELLA Rough, PA-C 9:10 AM 12/01/2024

## 2024-12-05 ENCOUNTER — Other Ambulatory Visit (HOSPITAL_COMMUNITY): Payer: Self-pay

## 2024-12-08 ENCOUNTER — Encounter: Payer: Self-pay | Admitting: Oncology

## 2024-12-08 ENCOUNTER — Telehealth (HOSPITAL_COMMUNITY): Payer: Self-pay

## 2024-12-08 ENCOUNTER — Other Ambulatory Visit: Payer: Self-pay

## 2024-12-08 ENCOUNTER — Encounter (HOSPITAL_COMMUNITY): Payer: Self-pay

## 2024-12-08 ENCOUNTER — Ambulatory Visit (HOSPITAL_COMMUNITY)
Admission: RE | Admit: 2024-12-08 | Discharge: 2024-12-08 | Disposition: A | Discharge status: Home / Self Care | Source: Ambulatory Visit | Attending: Cardiology | Admitting: Cardiology

## 2024-12-08 ENCOUNTER — Other Ambulatory Visit (HOSPITAL_COMMUNITY): Payer: Self-pay

## 2024-12-08 VITALS — BP 98/62 | HR 110 | Ht 68.0 in | Wt 176.8 lb

## 2024-12-08 DIAGNOSIS — Z79899 Other long term (current) drug therapy: Secondary | ICD-10-CM | POA: Diagnosis not present

## 2024-12-08 DIAGNOSIS — Z7982 Long term (current) use of aspirin: Secondary | ICD-10-CM | POA: Diagnosis not present

## 2024-12-08 DIAGNOSIS — I5022 Chronic systolic (congestive) heart failure: Secondary | ICD-10-CM | POA: Insufficient documentation

## 2024-12-08 DIAGNOSIS — Z87891 Personal history of nicotine dependence: Secondary | ICD-10-CM | POA: Diagnosis not present

## 2024-12-08 DIAGNOSIS — Z951 Presence of aortocoronary bypass graft: Secondary | ICD-10-CM | POA: Insufficient documentation

## 2024-12-08 DIAGNOSIS — E782 Mixed hyperlipidemia: Secondary | ICD-10-CM

## 2024-12-08 DIAGNOSIS — I471 Supraventricular tachycardia, unspecified: Secondary | ICD-10-CM | POA: Diagnosis not present

## 2024-12-08 DIAGNOSIS — Z833 Family history of diabetes mellitus: Secondary | ICD-10-CM | POA: Insufficient documentation

## 2024-12-08 DIAGNOSIS — E119 Type 2 diabetes mellitus without complications: Secondary | ICD-10-CM | POA: Diagnosis not present

## 2024-12-08 DIAGNOSIS — I2510 Atherosclerotic heart disease of native coronary artery without angina pectoris: Secondary | ICD-10-CM | POA: Insufficient documentation

## 2024-12-08 DIAGNOSIS — E785 Hyperlipidemia, unspecified: Secondary | ICD-10-CM | POA: Insufficient documentation

## 2024-12-08 DIAGNOSIS — Z7985 Long-term (current) use of injectable non-insulin antidiabetic drugs: Secondary | ICD-10-CM | POA: Diagnosis not present

## 2024-12-08 DIAGNOSIS — E1165 Type 2 diabetes mellitus with hyperglycemia: Secondary | ICD-10-CM

## 2024-12-08 DIAGNOSIS — Z7984 Long term (current) use of oral hypoglycemic drugs: Secondary | ICD-10-CM | POA: Insufficient documentation

## 2024-12-08 MED ORDER — FUROSEMIDE 40 MG PO TABS: 40.0000 mg | ORAL_TABLET | ORAL | 6 refills | Status: AC | PRN

## 2024-12-08 MED ORDER — DOXYCYCLINE HYCLATE 100 MG PO TABS: 100.0000 mg | ORAL_TABLET | Freq: Two times a day (BID) | ORAL | 0 refills | Status: AC

## 2024-12-08 MED ORDER — MUPIROCIN 2 % EX OINT: 1.0000 | TOPICAL_OINTMENT | Freq: Two times a day (BID) | CUTANEOUS | 0 refills | Status: AC

## 2024-12-08 NOTE — Addendum Note (Signed)
 Encounter addended by: Chloie Loney, NP on: 12/08/2024 4:54 PM  Actions taken: Clinical Note Signed

## 2024-12-08 NOTE — Progress Notes (Addendum)
 ADVANCED HF CLINIC NOTE  PCP: [name], Karlynn GAILS, MD HF Cardiologist: Dr. Rolan  HPI: [name] [name] is a 58 y.o. female with history of atrial tachycardia s/p ablation in July 2025, peripartum cardiomyopathy in 2005, hyperlipidemia, DM II, GERD, tobacco use, iron  deficiency anemia.  On review of old records note history of admission for CHF in June 2005 after delivery of her first child the month prior. EF 25-30% at that time. No CAD on cath. EF had recovered on subsequent echo in June 2006.  Later had admission with SVT in 5/25. Echo at that time with LVEF 40-45%. Had SVT ablation in July 2025. Underwent coronary CTA which showed flow-limiting disease involving LAD, RCA and LCX by CT-FFR.    She presented 09/19/24 with worsening shortness of breath, orthopnea, PND and episodes of chest pain.  She was admitted with acute on chronic CHF.  Cardiology was consulted.  Echo showed EF 25-30%, WMA, RV okay, moderate pericardial effusion. [facility] 09/21/24 showed severe 2V CAD, LVEDP 33, RA mean 11, PA 55/30 (39), PCWP mean 29, Fick CO/CI 6.12/3.02. She was diuresed and GDMT titrated. Seen by [facility] with plan for CABG once optimized. Referred to heart failure clinic.   She had CABG in 11/25 with LIMA-LAD, SVG-PDA, and radial-D1, uneventful post-op course.   She returns today for HF follow up. Overall feeling well. NYHA II. Able to perform ADLs. No SOB, CP. She is having some occasional dizziness. Does have occasional pain in right pec from surgery but it is getting better. Compliant with all medications. Ready to return to work when [employer] (9/25): LDL 51, TGs 246 Labs (10/25): K 3.8, creatinine 0.72 Labs (11/25): K 3.3, creatinine 0.59  PMH: 1. Type 2 diabetes 2. Chronic systolic CHF: Peripartum cardiomyopathy initially, now ischemic cardiomyopathy.  - Peripartum cardiomyopathy with echo in 6/25 showing EF 25-30%.  Repeat echo in 6/06 with normal systolic function.  - Echo (5/25)  with EF down to 40-45%.  - Echo (9/25) with EF 25-30%, WMA, RV okay, moderate pericardial effusion. - LHC/RHC (9/25): 80% pLAD, 90% dLAD, 80% large D1, 99% pRCA; LVEDP 33, RA mean 11, PA 55/30 (39), PCWP mean 29, Fick CO/CI 6.12/3.02. - CABG 11/25 3. Atrial tachycardia s/p ablation in 7/25 by Dr. Waddell 4. CAD: LHC (9/25) showed 80% pLAD, 90% dLAD, 80% large D1, 99% pRCA.  - CABG (11/25) with LIMA-LAD, SVG-PDA, and radial-D1 5. GERD 6. CMV hepatitis   Current Outpatient Medications  Medication Sig Dispense Refill   acetaminophen  (TYLENOL ) 325 MG tablet Take 2 tablets (650 mg total) by mouth every 6 (six) hours as needed for mild pain (pain score 1-3).     Albuterol -Budesonide  (AIRSUPRA ) 90-80 MCG/ACT AERO Inhale 2 Inhalations into the lungs 4 (four) times daily as needed. 10 g 5   ALPRAZolam  (XANAX ) 0.5 MG tablet TAKE 1 TABLET(0.5 MG) BY MOUTH TWICE DAILY (Patient taking differently: Take 0.5 mg by mouth 2 (two) times daily as needed for anxiety.) 180 tablet 0   amLODipine  (NORVASC ) 5 MG tablet Take 1 tablet (5 mg total) by mouth daily. 60 tablet 1   aspirin  EC 325 MG tablet Take 1 tablet (325 mg total) by mouth daily.     atorvastatin  (LIPITOR ) 80 MG tablet Take 1 tablet (80 mg total) by mouth daily. 90 tablet 3   buPROPion  (WELLBUTRIN  SR) 150 MG 12 hr tablet TAKE 1 TABLET BY MOUTH TWICE DAILY 180 tablet 1   carvedilol  (COREG ) 6.25 MG tablet Take  1 tablet (6.25 mg total) by mouth 2 (two) times daily. 180 tablet 3   Cholecalciferol  (EQL VITAMIN D3) 1000 units tablet Take 1 tablet (1,000 Units total) by mouth daily. 90 tablet 1   citalopram  (CELEXA ) 40 MG tablet Take 1 tablet (40 mg total) by mouth daily. 90 tablet 3   clindamycin  (CLINDAGEL) 1 % gel Apply to affected area 2 times daily 30 g 0   clindamycin  (CLINDAGEL) 1 % gel Apply topically 2 (two) times daily. 30 g 0   doxycycline  (VIBRA -TABS) 100 MG tablet Take 1 tablet (100 mg total) by mouth every 12 (twelve) hours. 6 tablet 0    furosemide  (LASIX ) 40 MG tablet Take 1 tablet (40 mg total) by mouth daily. 30 tablet 6   gabapentin  (NEURONTIN ) 100 MG capsule TAKE 1 CAPSULE(100 MG) BY MOUTH THREE TIMES DAILY 90 capsule 5   GLYXAMBI  25-5 MG TABS TAKE 1 TABLET BY MOUTH DAILY 90 tablet 3   icosapent  Ethyl (VASCEPA ) 1 g capsule Take 2 capsules (2 g total) by mouth 2 (two) times daily. 120 capsule 5   metFORMIN  (GLUCOPHAGE ) 500 MG tablet TAKE 2 TABLETS BY MOUTH TWICE DAILY WITH FOOD 360 tablet 0   oxyCODONE  (OXY IR/ROXICODONE ) 5 MG immediate release tablet Take 1 tablet (5 mg total) by mouth every 6 (six) hours as needed for severe pain (pain score 7-10). 28 tablet 0   pantoprazole  (PROTONIX ) 40 MG tablet TAKE 1 TABLET(40 MG) BY MOUTH TWICE DAILY 180 tablet 3   potassium chloride  (KLOR-CON ) 8 MEQ tablet Take 1 tablet (8 mEq total) by mouth 4 (four) times daily. 90 tablet 5   repaglinide  (PRANDIN ) 2 MG tablet TAKE 1 TABLET BY MOUTH THREE TIMES DAILY BEFORE MEALS 270 tablet 3   sacubitril -valsartan  (ENTRESTO ) 24-26 MG Take 1 tablet by mouth 2 (two) times daily. 60 tablet 3   Semaglutide , 2 MG/DOSE, 8 MG/3ML SOPN 2 mg sq weekly 3 mL 5   spironolactone  (ALDACTONE ) 25 MG tablet Take 1 tablet (25 mg total) by mouth daily. 90 tablet 3   No current facility-administered medications for this visit.   Facility-Administered Medications Ordered in Other Visits  Medication Dose Route Frequency Provider Last Rate Last Admin   [facility]   Does not apply Once Kerrin Elspeth BROCKS, MD       Allergies  Allergen Reactions   Adenosine  Shortness Of Breath    SOB and pressure for 20 seconds   Social History   Socioeconomic History   Marital status: Married    Spouse name: Lyndy   Number of children: 2   Years of education: Not on file   Highest education level: Bachelor's degree (e.g., BA, AB, BS)  Occupational History   Occupation: EC teacher    Employer: [employer]  Tobacco Use    Smoking status: Former    Current packs/day: 0.50    Average packs/day: 0.5 packs/day for 15.0 years (7.5 ttl pk-yrs)    Types: Cigarettes   Smokeless tobacco: Never   Tobacco comments:    info given 12/14/2019  Vaping Use   Vaping status: Never Used  Substance and Sexual Activity   Alcohol use: Not Currently   Drug use: No   Sexual activity: Not Currently    Birth control/protection: Post-menopausal  Other Topics Concern   Not on file  Social History Narrative   Not on file   Social Drivers of Health   Tobacco Use: Medium Risk (12/01/2024)   Patient  History    Smoking Tobacco Use: Former    Smokeless Tobacco Use: Never    Passive Exposure: Not on file  Financial Resource Strain: Low Risk (09/21/2024)   Overall Financial Resource Strain (CARDIA)    Difficulty of Paying Living Expenses: Not very hard  Food Insecurity: No Food Insecurity (10/31/2024)   Epic    Worried About Programme Researcher, Broadcasting/film/video in the Last Year: Never true    Ran Out of Food in the Last Year: Never true  Transportation Needs: No Transportation Needs (10/31/2024)   Epic    Lack of Transportation (Medical): No    Lack of Transportation (Non-Medical): No  Physical Activity: Inactive (07/03/2024)   Exercise Vital Sign    Days of Exercise per Week: 0 days    Minutes of Exercise per Session: Not on file  Stress: No Stress Concern Present (07/03/2024)   [employer] of Occupational Health - Occupational Stress Questionnaire    Feeling of Stress: Only a little  Social Connections: Socially Integrated (07/03/2024)   Social Connection and Isolation Panel    Frequency of Communication with Friends and Family: More than three times a week    Frequency of Social Gatherings with Friends and Family: Once a week    Attends Religious Services: 1 to 4 times per year    Active Member of [employer] or Organizations: Yes    Attends Banker Meetings: 1 to 4 times per year    Marital Status: Married  Careers Information Officer Violence: Not At Risk (10/31/2024)   Epic    Fear of Current or Ex-Partner: No    Emotionally Abused: No    Physically Abused: No    Sexually Abused: No  Depression (PHQ2-9): Low Risk (10/31/2024)   Depression (PHQ2-9)    PHQ-2 Score: 1  Recent Concern: Depression (PHQ2-9) - Medium Risk (09/08/2024)   Depression (PHQ2-9)    PHQ-2 Score: 10  Alcohol Screen: Low Risk (09/21/2024)   Alcohol Screen    Last Alcohol Screening Score (AUDIT): 0  Housing: Low Risk (10/31/2024)   Epic    Unable to Pay for Housing in the Last Year: No    Number of Times Moved in the Last Year: 0    Homeless in the Last Year: No  Utilities: Not At Risk (10/31/2024)   Epic    Threatened with loss of utilities: No  Health Literacy: Not on file   Family History  Problem Relation Age of Onset   Diabetes Mother    Hypertension Mother    Anxiety disorder Mother    Depression Mother    Hearing loss Mother    Miscarriages / Stillbirths Mother    Obesity Mother    Varicose Veins Mother    COPD Father    Depression Father    Hearing loss Father    Heart disease Father    Obesity Father    Inflammatory bowel disease Maternal Aunt    Colon cancer Paternal Aunt    Colon polyps Maternal Grandmother    Arthritis Maternal Grandmother    Cancer Maternal Grandmother    Hypertension Maternal Grandmother    Obesity Maternal Grandmother    Hypertension Other    Anxiety disorder Maternal Aunt    Cancer Maternal Aunt    Diabetes Maternal Aunt    Liver cancer Neg Hx    Pancreatic cancer Neg Hx    Rectal cancer Neg Hx    Stomach cancer Neg Hx    Esophageal cancer Neg Hx  ROS: All systems reviewed and negative except as per HPI.   Wt Readings from Last 3 Encounters:  12/01/24 81.2 kg (179 lb)  11/16/24 83.8 kg (184 lb 12.8 oz)  11/07/24 84.4 kg (186 lb)   LMP 03/02/2017   PHYSICAL EXAM: General: Well appearing. No distress  Cardiac: JVP flat. No murmurs  Resp: Lung sounds clear and equal  B/L Extremities: Warm and dry.  No edema.  Neuro: A&O x3. Affect pleasant.   ASSESSMENT & PLAN: 1. Chronic systolic CHF: She has a history of peripartum cardiomyopathy in 2005, EF down to 25-30% w/ later recovery in EF by 2006. No CAD on cath at that time. Now with ischemic cardiomyopathy. Echo in 9/25 showed LVEF 25-30%, WMA, RV okay, moderate pericardial effusion. Cath at that time showed severe disease in LAD, D1, RCA.  She had CABG x 3 in 11/25.  - NYHA class II. Euvolemic on exam - continue entresto  24/26 bid (paying out of pocket) - start jardiance  10 mg daily (notified PCP since on PO treatment for T2DM) - stop lasix  with jardiance  and entresto  - continue coreg  6.25 mg bid - continue spironolactone  25 mg daily - stop norvasc  with dizziness and soft BP; given script for BP cuff - Will titrate GDMT and arrange for echo in 3 months post-CABG to decide on ICD.  RBBB, so will not be CRT candidate.  2. CAD: Cath 10/25 with severe disease in LAD, D1, and RCA.  Now s/p CABG with LIMA-LAD, SVG-PDA, radial-D1.  - Continue aspirin  + atorvastatin  80 mg daily 3. DM2: Per PCP. Notified PCP about starting jardiance . 4. SVT: Right atrial tachycardia, s/p ablation in 7/25.  - Continue beta blocker.  5. Hyperlipidemia: LDL at goal on statin (<55%).  - On Vascepa  2 g bid with elevated triglycerides and CAD. Check lipids at next follow up appt. 6. Surgical wound: R arm with surgical incision. Area near wrist appears inflamed and with small amount of purulent drainage from around scab. Surgical PA notified.   Follow up in 6 weeks with APP for med titration, then in 3 months with Dr. Rolan + Echo  Dalaina Tates, NP 12/08/2024

## 2024-12-08 NOTE — Patient Instructions (Addendum)
 STOP Amlodipine   CHANGE Lasix  to as needed for weight gain of 3 lb in 24 hours or 5 lb in a week.  START Jardiance  10 mg daily.  Check your blood daily.  Your physician recommends that you schedule a follow-up appointment in: 6 weeks.  If you have any questions or concerns before your next appointment please send us  a message through [place] or call our office at [phone].    TO LEAVE A MESSAGE FOR THE NURSE SELECT OPTION 2, PLEASE LEAVE A MESSAGE INCLUDING: YOUR NAME DATE OF BIRTH CALL BACK NUMBER REASON FOR CALL**this is important as we prioritize the call backs  YOU WILL RECEIVE A CALL BACK THE SAME DAY AS LONG AS YOU CALL BEFORE 4:00 PM  At the [facility], you and your health needs are our priority. As part of our continuing mission to provide you with exceptional heart care, we have created designated Provider Care Teams. These Care Teams include your primary Cardiologist (physician) and Advanced Practice Providers (APPs- Physician Assistants and Nurse Practitioners) who all work together to provide you with the care you need, when you need it.   You may see any of the following providers on your designated Care Team at your next follow up: Dr Toribio Fuel Dr Ezra Shuck Dr. Morene Brownie Greig Mosses, NP Caffie Shed, [place] [facility], [place] Beckey Coe, NP Jordan Lee, NP Ellouise Class, NP Tinnie Redman, PharmD Jaun Bash, PharmD   Please be sure to bring in all your medications bottles to every appointment.    Thank you for choosing [facility]

## 2024-12-08 NOTE — Progress Notes (Signed)
-  Received a message from Jordan Lee, NP from heart failure clinic that patients left radial artery harvest site had erythema with drainage.  She was seen at our clinic on 11/28/2024 and incision site did not have drainage.   -Patient states that after showering the lower portion of her incision is scabbed over and after getting wet softens and she has serous drainage.  No tenderness and drainage is not purulent.  -Sent in mupirocin  ointment for topical use. -Sent in doxycycline  100 mg BID for 7 days  -All questions answered and she will reach out to our clinic if incision site worsens

## 2024-12-08 NOTE — Telephone Encounter (Signed)
 Called to confirm/remind patient of their appointment at the [facility] on 12/08/24.   Appointment:   [x] Confirmed  [] Left mess   [] No answer/No voice mail  [] VM Full/unable to leave message  [] Phone not in service  Patient reminded to bring all medications and/or complete list.  Confirmed patient has transportation. Gave directions, instructed to utilize valet parking.

## 2024-12-28 ENCOUNTER — Encounter: Payer: Self-pay | Admitting: Oncology

## 2024-12-28 ENCOUNTER — Other Ambulatory Visit

## 2024-12-28 ENCOUNTER — Inpatient Hospital Stay: Payer: BC Managed Care – PPO | Attending: Physician Assistant

## 2024-12-28 DIAGNOSIS — D509 Iron deficiency anemia, unspecified: Secondary | ICD-10-CM | POA: Insufficient documentation

## 2024-12-28 DIAGNOSIS — Z006 Encounter for examination for normal comparison and control in clinical research program: Secondary | ICD-10-CM

## 2024-12-28 LAB — IRON AND IRON BINDING CAPACITY (CC-WL,HP ONLY)
Iron: 33 ug/dL (ref 28–170)
Saturation Ratios: 9 % — ABNORMAL LOW (ref 10.4–31.8)
TIBC: 360 ug/dL (ref 250–450)
UIBC: 326 ug/dL

## 2024-12-28 LAB — CBC WITH DIFFERENTIAL (CANCER CENTER ONLY)
Abs Immature Granulocytes: 0.02 K/uL (ref 0.00–0.07)
Basophils Absolute: 0.1 K/uL (ref 0.0–0.1)
Basophils Relative: 1 %
Eosinophils Absolute: 0.3 K/uL (ref 0.0–0.5)
Eosinophils Relative: 3 %
HCT: 33.2 % — ABNORMAL LOW (ref 36.0–46.0)
Hemoglobin: 10.4 g/dL — ABNORMAL LOW (ref 12.0–15.0)
Immature Granulocytes: 0 %
Lymphocytes Relative: 27 %
Lymphs Abs: 2.6 K/uL (ref 0.7–4.0)
MCH: 25.4 pg — ABNORMAL LOW (ref 26.0–34.0)
MCHC: 31.3 g/dL (ref 30.0–36.0)
MCV: 81.2 fL (ref 80.0–100.0)
Monocytes Absolute: 0.5 K/uL (ref 0.1–1.0)
Monocytes Relative: 5 %
Neutro Abs: 6.1 K/uL (ref 1.7–7.7)
Neutrophils Relative %: 64 %
Platelet Count: 239 K/uL (ref 150–400)
RBC: 4.09 MIL/uL (ref 3.87–5.11)
RDW: 16.4 % — ABNORMAL HIGH (ref 11.5–15.5)
WBC Count: 9.5 K/uL (ref 4.0–10.5)
nRBC: 0 % (ref 0.0–0.2)

## 2024-12-28 LAB — FERRITIN: Ferritin: 20 ng/mL (ref 11–307)

## 2024-12-31 ENCOUNTER — Other Ambulatory Visit: Payer: Self-pay

## 2025-01-04 ENCOUNTER — Other Ambulatory Visit (HOSPITAL_COMMUNITY): Payer: Self-pay

## 2025-01-06 LAB — GENECONNECT MOLECULAR SCREEN: Genetic Analysis Overall Interpretation: NEGATIVE

## 2025-01-18 ENCOUNTER — Telehealth (HOSPITAL_COMMUNITY): Payer: Self-pay

## 2025-01-18 NOTE — Telephone Encounter (Signed)
 Called to confirm/remind patient of their appointment at the [facility] on 01/18/25.   Appointment:   [x] Confirmed  [] Left mess   [] No answer/No voice mail  [] VM Full/unable to leave message  [] Phone not in service  Patient reminded to bring all medications and/or complete list.  Confirmed patient has transportation. Gave directions, instructed to utilize valet parking.

## 2025-01-19 ENCOUNTER — Ambulatory Visit (HOSPITAL_COMMUNITY)

## 2025-01-25 NOTE — Progress Notes (Incomplete)
 "  ADVANCED HF CLINIC NOTE  PCP: [name], Karlynn GAILS, MD HF Cardiologist: Dr. Rolan  HPI: [name] [name] is a 59 y.o. female with history of atrial tachycardia s/p ablation in July 2025, peripartum cardiomyopathy in 2005, hyperlipidemia, DM II, GERD, tobacco use, iron  deficiency anemia.  On review of old records note history of admission for CHF in June 2005 after delivery of her first child the month prior. EF 25-30% at that time. No CAD on cath. EF had recovered on subsequent echo in June 2006.  Later had admission with SVT in 5/25. Echo at that time with LVEF 40-45%. Had SVT ablation in July 2025. Underwent coronary CTA which showed flow-limiting disease involving LAD, RCA and LCX by CT-FFR.    She presented 09/19/24 with worsening shortness of breath, orthopnea, PND and episodes of chest pain.  She was admitted with acute on chronic CHF.  Cardiology was consulted.  Echo showed EF 25-30%, WMA, RV okay, moderate pericardial effusion. [facility] 09/21/24 showed severe 2V CAD, LVEDP 33, RA mean 11, PA 55/30 (39), PCWP mean 29, Fick CO/CI 6.12/3.02. She was diuresed and GDMT titrated. Seen by [facility] with plan for CABG once optimized. Referred to heart failure clinic.   She had CABG in 11/25 with LIMA-LAD, SVG-PDA, and radial-D1, uneventful post-op course.   She returns today for HF follow up. Overall feeling well. NYHA II. Able to perform ADLs. No SOB, CP. She is having some occasional dizziness. Does have occasional pain in right pec from surgery but it is getting better. Compliant with all medications. Ready to return to work when [employer] (9/25): LDL 51, TGs 246 Labs (10/25): K 3.8, creatinine 0.72 Labs (11/25): K 3.3, creatinine 0.59  PMH: 1. Type 2 diabetes 2. Chronic systolic CHF: Peripartum cardiomyopathy initially, now ischemic cardiomyopathy.  - Peripartum cardiomyopathy with echo in 6/25 showing EF 25-30%.  Repeat echo in 6/06 with normal systolic function.  - Echo (5/25)  with EF down to 40-45%.  - Echo (9/25) with EF 25-30%, WMA, RV okay, moderate pericardial effusion. - LHC/RHC (9/25): 80% pLAD, 90% dLAD, 80% large D1, 99% pRCA; LVEDP 33, RA mean 11, PA 55/30 (39), PCWP mean 29, Fick CO/CI 6.12/3.02. - CABG 11/25 3. Atrial tachycardia s/p ablation in 7/25 by Dr. Waddell 4. CAD: LHC (9/25) showed 80% pLAD, 90% dLAD, 80% large D1, 99% pRCA.  - CABG (11/25) with LIMA-LAD, SVG-PDA, and radial-D1 5. GERD 6. CMV hepatitis   Current Outpatient Medications  Medication Sig Dispense Refill   acetaminophen  (TYLENOL ) 325 MG tablet Take 2 tablets (650 mg total) by mouth every 6 (six) hours as needed for mild pain (pain score 1-3).     Albuterol -Budesonide  (AIRSUPRA ) 90-80 MCG/ACT AERO Inhale 2 Inhalations into the lungs 4 (four) times daily as needed. 10 g 5   ALPRAZolam  (XANAX ) 0.5 MG tablet TAKE 1 TABLET(0.5 MG) BY MOUTH TWICE DAILY (Patient taking differently: Take 0.5 mg by mouth 2 (two) times daily as needed for anxiety.) 180 tablet 0   aspirin  EC 325 MG tablet Take 1 tablet (325 mg total) by mouth daily.     atorvastatin  (LIPITOR ) 80 MG tablet Take 1 tablet (80 mg total) by mouth daily. 90 tablet 3   buPROPion  (WELLBUTRIN  SR) 150 MG 12 hr tablet TAKE 1 TABLET BY MOUTH TWICE DAILY 180 tablet 1   carvedilol  (COREG ) 6.25 MG tablet Take 1 tablet (6.25 mg total) by mouth 2 (two) times daily. 180 tablet 3   Cholecalciferol  (EQL  VITAMIN D3) 1000 units tablet Take 1 tablet (1,000 Units total) by mouth daily. 90 tablet 1   citalopram  (CELEXA ) 40 MG tablet Take 1 tablet (40 mg total) by mouth daily. 90 tablet 3   clindamycin  (CLINDAGEL) 1 % gel Apply to affected area 2 times daily 30 g 0   clindamycin  (CLINDAGEL) 1 % gel Apply topically 2 (two) times daily. 30 g 0   doxycycline  (VIBRA -TABS) 100 MG tablet Take 1 tablet (100 mg total) by mouth every 12 (twelve) hours. 6 tablet 0   doxycycline  (VIBRA -TABS) 100 MG tablet Take 1 tablet (100 mg total) by mouth 2 (two) times daily.  14 tablet 0   furosemide  (LASIX ) 40 MG tablet Take 1 tablet (40 mg total) by mouth as needed. For weight gain of 3 lb in 24 hours or 5 lb in a week. 30 tablet 6   gabapentin  (NEURONTIN ) 100 MG capsule TAKE 1 CAPSULE(100 MG) BY MOUTH THREE TIMES DAILY 90 capsule 5   GLYXAMBI  25-5 MG TABS TAKE 1 TABLET BY MOUTH DAILY 90 tablet 3   icosapent  Ethyl (VASCEPA ) 1 g capsule Take 2 capsules (2 g total) by mouth 2 (two) times daily. (Patient not taking: Reported on 12/08/2024) 120 capsule 5   JARDIANCE  10 MG TABS tablet Take 1 tablet (10 mg total) by mouth daily before breakfast. 90 tablet 3   metFORMIN  (GLUCOPHAGE ) 500 MG tablet TAKE 2 TABLETS BY MOUTH TWICE DAILY WITH FOOD 360 tablet 0   mupirocin  ointment (BACTROBAN ) 2 % Apply 1 Application topically 2 (two) times daily. 22 g 0   oxyCODONE  (OXY IR/ROXICODONE ) 5 MG immediate release tablet Take 1 tablet (5 mg total) by mouth every 6 (six) hours as needed for severe pain (pain score 7-10). 28 tablet 0   pantoprazole  (PROTONIX ) 40 MG tablet TAKE 1 TABLET(40 MG) BY MOUTH TWICE DAILY 180 tablet 3   potassium chloride  (KLOR-CON ) 8 MEQ tablet Take 1 tablet (8 mEq total) by mouth 4 (four) times daily. 90 tablet 5   repaglinide  (PRANDIN ) 2 MG tablet TAKE 1 TABLET BY MOUTH THREE TIMES DAILY BEFORE MEALS 270 tablet 3   sacubitril -valsartan  (ENTRESTO ) 24-26 MG Take 1 tablet by mouth 2 (two) times daily. 60 tablet 3   Semaglutide , 2 MG/DOSE, 8 MG/3ML SOPN 2 mg sq weekly 3 mL 5   spironolactone  (ALDACTONE ) 25 MG tablet Take 1 tablet (25 mg total) by mouth daily. 90 tablet 3   No current facility-administered medications for this visit.   Facility-Administered Medications Ordered in Other Visits  Medication Dose Route Frequency Provider Last Rate Last Admin   [facility]   Does not apply Once Kerrin Elspeth BROCKS, MD       Allergies  Allergen Reactions   Adenosine  Shortness Of Breath    SOB and pressure for 20 seconds    Social History   Socioeconomic History   Marital status: Married    Spouse name: Lyndy   Number of children: 2   Years of education: Not on file   Highest education level: Bachelor's degree (e.g., BA, AB, BS)  Occupational History   Occupation: EC teacher    Employer: [employer]  Tobacco Use   Smoking status: Former    Current packs/day: 0.50    Average packs/day: 0.5 packs/day for 15.0 years (7.5 ttl pk-yrs)    Types: Cigarettes   Smokeless tobacco: Never   Tobacco comments:    info given 12/14/2019  Vaping Use   Vaping  status: Never Used  Substance and Sexual Activity   Alcohol use: Not Currently   Drug use: No   Sexual activity: Not Currently    Birth control/protection: Post-menopausal  Other Topics Concern   Not on file  Social History Narrative   Not on file   Social Drivers of Health   Tobacco Use: Medium Risk (12/08/2024)   Patient History    Smoking Tobacco Use: Former    Smokeless Tobacco Use: Never    Passive Exposure: Not on file  Financial Resource Strain: Low Risk (09/21/2024)   Overall Financial Resource Strain (CARDIA)    Difficulty of Paying Living Expenses: Not very hard  Food Insecurity: No Food Insecurity (10/31/2024)   Epic    Worried About Programme Researcher, Broadcasting/film/video in the Last Year: Never true    Ran Out of Food in the Last Year: Never true  Transportation Needs: No Transportation Needs (10/31/2024)   Epic    Lack of Transportation (Medical): No    Lack of Transportation (Non-Medical): No  Physical Activity: Inactive (07/03/2024)   Exercise Vital Sign    Days of Exercise per Week: 0 days    Minutes of Exercise per Session: Not on file  Stress: No Stress Concern Present (07/03/2024)   [employer] of Occupational Health - Occupational Stress Questionnaire    Feeling of Stress: Only a little  Social Connections: Socially Integrated (07/03/2024)   Social Connection and Isolation Panel    Frequency of Communication with Friends and  Family: More than three times a week    Frequency of Social Gatherings with Friends and Family: Once a week    Attends Religious Services: 1 to 4 times per year    Active Member of Clubs or Organizations: Yes    Attends Banker Meetings: 1 to 4 times per year    Marital Status: Married  Catering Manager Violence: Not At Risk (10/31/2024)   Epic    Fear of Current or Ex-Partner: No    Emotionally Abused: No    Physically Abused: No    Sexually Abused: No  Depression (PHQ2-9): Low Risk (10/31/2024)   Depression (PHQ2-9)    PHQ-2 Score: 1  Recent Concern: Depression (PHQ2-9) - Medium Risk (09/08/2024)   Depression (PHQ2-9)    PHQ-2 Score: 10  Alcohol Screen: Low Risk (09/21/2024)   Alcohol Screen    Last Alcohol Screening Score (AUDIT): 0  Housing: Low Risk (10/31/2024)   Epic    Unable to Pay for Housing in the Last Year: No    Number of Times Moved in the Last Year: 0    Homeless in the Last Year: No  Utilities: Not At Risk (10/31/2024)   Epic    Threatened with loss of utilities: No  Health Literacy: Not on file   Family History  Problem Relation Age of Onset   Diabetes Mother    Hypertension Mother    Anxiety disorder Mother    Depression Mother    Hearing loss Mother    Miscarriages / Stillbirths Mother    Obesity Mother    Varicose Veins Mother    COPD Father    Depression Father    Hearing loss Father    Heart disease Father    Obesity Father    Inflammatory bowel disease Maternal Aunt    Colon cancer Paternal Aunt    Colon polyps Maternal Grandmother    Arthritis Maternal Grandmother    Cancer Maternal Grandmother    Hypertension Maternal Grandmother  Obesity Maternal Grandmother    Hypertension Other    Anxiety disorder Maternal Aunt    Cancer Maternal Aunt    Diabetes Maternal Aunt    Liver cancer Neg Hx    Pancreatic cancer Neg Hx    Rectal cancer Neg Hx    Stomach cancer Neg Hx    Esophageal cancer Neg Hx    ROS: All systems  reviewed and negative except as per HPI.   Wt Readings from Last 3 Encounters:  12/08/24 80.2 kg (176 lb 12.8 oz)  12/01/24 81.2 kg (179 lb)  11/16/24 83.8 kg (184 lb 12.8 oz)   LMP 03/02/2017   PHYSICAL EXAM: General: Well appearing. No distress  Cardiac: JVP flat. No murmurs  Resp: Lung sounds clear and equal B/L Extremities: Warm and dry.  No edema.  Neuro: A&O x3. Affect pleasant.   ASSESSMENT & PLAN: 1. Chronic systolic CHF: She has a history of peripartum cardiomyopathy in 2005, EF down to 25-30% w/ later recovery in EF by 2006. No CAD on cath at that time. Now with ischemic cardiomyopathy. Echo in 9/25 showed LVEF 25-30%, WMA, RV okay, moderate pericardial effusion. Cath at that time showed severe disease in LAD, D1, RCA.  She had CABG x 3 in 11/25.  - NYHA class II. Euvolemic on exam - continue entresto  24/26 bid (paying out of pocket) - start jardiance  10 mg daily (notified PCP since on PO treatment for T2DM) - stop lasix  with jardiance  and entresto  - continue coreg  6.25 mg bid - continue spironolactone  25 mg daily - stop norvasc  with dizziness and soft BP; given script for BP cuff - Will titrate GDMT and arrange for echo in 3 months post-CABG to decide on ICD.  RBBB, so will not be CRT candidate.  2. CAD: Cath 10/25 with severe disease in LAD, D1, and RCA.  Now s/p CABG with LIMA-LAD, SVG-PDA, radial-D1.  - Continue aspirin  + atorvastatin  80 mg daily 3. DM2: Per PCP. Notified PCP about starting jardiance . 4. SVT: Right atrial tachycardia, s/p ablation in 7/25.  - Continue beta blocker.  5. Hyperlipidemia: LDL at goal on statin (<55%).  - On Vascepa  2 g bid with elevated triglycerides and CAD. Check lipids at next follow up appt. 6. Surgical wound: R arm with surgical incision. Area near wrist appears inflamed and with small amount of purulent drainage from around scab. Surgical PA notified.   Follow up in 6 weeks with APP for med titration, then in 3 months with Dr.  Rolan + Echo  Harlene HERO [place], [place] 01/25/25  "

## 2025-01-27 ENCOUNTER — Telehealth (HOSPITAL_COMMUNITY): Payer: Self-pay

## 2025-01-27 NOTE — Telephone Encounter (Signed)
 Called to confirm/remind patient of their appointment [facility] on [facility]/30/[facility].   Appointment:   [] Confirmed  [] Left mess   [x] No answer/No voice mail  [] VM Full/unable to leave message  [] Phone not in service

## 2025-01-30 ENCOUNTER — Ambulatory Visit (HOSPITAL_COMMUNITY)

## 2025-02-21 ENCOUNTER — Ambulatory Visit: Admitting: Student in an Organized Health Care Education/Training Program
